# Patient Record
Sex: Male | Born: 1950
Health system: Southern US, Community
[De-identification: ages and names within clinical notes are randomized; demographics above are authoritative.]

## PROBLEM LIST (undated history)

## (undated) DIAGNOSIS — T7840XA Allergy, unspecified, initial encounter: Secondary | ICD-10-CM

## (undated) DIAGNOSIS — R001 Bradycardia, unspecified: Secondary | ICD-10-CM

## (undated) DIAGNOSIS — I452 Bifascicular block: Secondary | ICD-10-CM

## (undated) DIAGNOSIS — N529 Male erectile dysfunction, unspecified: Secondary | ICD-10-CM

## (undated) DIAGNOSIS — E079 Disorder of thyroid, unspecified: Secondary | ICD-10-CM

## (undated) DIAGNOSIS — E119 Type 2 diabetes mellitus without complications: Secondary | ICD-10-CM

## (undated) DIAGNOSIS — I5022 Chronic systolic (congestive) heart failure: Secondary | ICD-10-CM

## (undated) DIAGNOSIS — Z95810 Presence of automatic (implantable) cardiac defibrillator: Secondary | ICD-10-CM

## (undated) DIAGNOSIS — C801 Malignant (primary) neoplasm, unspecified: Secondary | ICD-10-CM

## (undated) DIAGNOSIS — M1990 Unspecified osteoarthritis, unspecified site: Secondary | ICD-10-CM

## (undated) DIAGNOSIS — E785 Hyperlipidemia, unspecified: Secondary | ICD-10-CM

## (undated) DIAGNOSIS — I255 Ischemic cardiomyopathy: Secondary | ICD-10-CM

## (undated) DIAGNOSIS — I2510 Atherosclerotic heart disease of native coronary artery without angina pectoris: Secondary | ICD-10-CM

## (undated) DIAGNOSIS — D689 Coagulation defect, unspecified: Secondary | ICD-10-CM

## (undated) HISTORY — DX: Type 2 diabetes mellitus without complications: E11.9

## (undated) HISTORY — DX: Atherosclerotic heart disease of native coronary artery without angina pectoris: I25.10

## (undated) HISTORY — PX: TONSILLECTOMY: SUR1361

## (undated) HISTORY — DX: Ischemic cardiomyopathy: I25.5

## (undated) HISTORY — DX: Bifascicular block: I45.2

## (undated) HISTORY — DX: Malignant (primary) neoplasm, unspecified: C80.1

## (undated) HISTORY — DX: Allergy, unspecified, initial encounter: T78.40XA

## (undated) HISTORY — DX: Hyperlipidemia, unspecified: E78.5

## (undated) HISTORY — DX: Bradycardia, unspecified: R00.1

## (undated) HISTORY — DX: Chronic systolic (congestive) heart failure: I50.22

## (undated) HISTORY — PX: COLONOSCOPY: SHX174

## (undated) HISTORY — DX: Coagulation defect, unspecified: D68.9

## (undated) HISTORY — DX: Presence of automatic (implantable) cardiac defibrillator: Z95.810

## (undated) HISTORY — DX: Unspecified osteoarthritis, unspecified site: M19.90

## (undated) HISTORY — DX: Male erectile dysfunction, unspecified: N52.9

---

## 1985-12-15 HISTORY — PX: ORIF ANKLE FRACTURE: SUR919

## 2004-12-02 ENCOUNTER — Ambulatory Visit: Payer: Self-pay | Admitting: Internal Medicine

## 2004-12-10 ENCOUNTER — Ambulatory Visit: Payer: Self-pay | Admitting: Internal Medicine

## 2004-12-23 ENCOUNTER — Ambulatory Visit: Payer: Self-pay | Admitting: Internal Medicine

## 2005-01-03 ENCOUNTER — Ambulatory Visit: Payer: Self-pay | Admitting: Internal Medicine

## 2005-01-27 ENCOUNTER — Ambulatory Visit: Payer: Self-pay | Admitting: Internal Medicine

## 2005-01-28 ENCOUNTER — Encounter: Admission: RE | Admit: 2005-01-28 | Discharge: 2005-01-28 | Payer: Self-pay | Admitting: Internal Medicine

## 2005-02-03 ENCOUNTER — Ambulatory Visit: Payer: Self-pay | Admitting: Internal Medicine

## 2005-02-10 ENCOUNTER — Ambulatory Visit: Payer: Self-pay | Admitting: *Deleted

## 2005-02-10 ENCOUNTER — Inpatient Hospital Stay (HOSPITAL_COMMUNITY): Admission: EM | Admit: 2005-02-10 | Discharge: 2005-02-16 | Payer: Self-pay | Admitting: Emergency Medicine

## 2005-02-10 ENCOUNTER — Ambulatory Visit: Payer: Self-pay

## 2005-02-12 DIAGNOSIS — Z951 Presence of aortocoronary bypass graft: Secondary | ICD-10-CM | POA: Insufficient documentation

## 2005-02-12 HISTORY — PX: CORONARY ARTERY BYPASS GRAFT: SHX141

## 2005-02-26 ENCOUNTER — Ambulatory Visit: Payer: Self-pay | Admitting: *Deleted

## 2005-03-10 ENCOUNTER — Encounter (HOSPITAL_COMMUNITY): Admission: RE | Admit: 2005-03-10 | Discharge: 2005-06-08 | Payer: Self-pay | Admitting: *Deleted

## 2005-03-17 ENCOUNTER — Ambulatory Visit: Payer: Self-pay | Admitting: Internal Medicine

## 2005-03-31 ENCOUNTER — Ambulatory Visit: Payer: Self-pay | Admitting: *Deleted

## 2005-04-07 ENCOUNTER — Ambulatory Visit: Payer: Self-pay | Admitting: *Deleted

## 2005-05-07 ENCOUNTER — Ambulatory Visit: Payer: Self-pay | Admitting: *Deleted

## 2005-05-16 ENCOUNTER — Ambulatory Visit: Payer: Self-pay | Admitting: *Deleted

## 2005-05-16 ENCOUNTER — Ambulatory Visit: Payer: Self-pay

## 2005-06-09 ENCOUNTER — Encounter (HOSPITAL_COMMUNITY): Admission: RE | Admit: 2005-06-09 | Discharge: 2005-09-07 | Payer: Self-pay | Admitting: *Deleted

## 2005-06-18 ENCOUNTER — Ambulatory Visit: Payer: Self-pay | Admitting: Cardiology

## 2005-06-18 ENCOUNTER — Ambulatory Visit: Payer: Self-pay | Admitting: *Deleted

## 2005-09-22 ENCOUNTER — Ambulatory Visit: Payer: Self-pay | Admitting: Internal Medicine

## 2005-09-29 ENCOUNTER — Ambulatory Visit: Payer: Self-pay | Admitting: Internal Medicine

## 2005-10-03 ENCOUNTER — Ambulatory Visit: Payer: Self-pay | Admitting: Internal Medicine

## 2005-10-17 ENCOUNTER — Ambulatory Visit: Payer: Self-pay

## 2005-12-10 ENCOUNTER — Ambulatory Visit: Payer: Self-pay | Admitting: Internal Medicine

## 2005-12-25 ENCOUNTER — Ambulatory Visit: Payer: Self-pay | Admitting: Internal Medicine

## 2006-04-06 ENCOUNTER — Ambulatory Visit: Payer: Self-pay | Admitting: Internal Medicine

## 2006-04-16 ENCOUNTER — Ambulatory Visit (HOSPITAL_COMMUNITY): Admission: RE | Admit: 2006-04-16 | Discharge: 2006-04-16 | Payer: Self-pay | Admitting: Internal Medicine

## 2006-04-27 ENCOUNTER — Ambulatory Visit: Payer: Self-pay | Admitting: Internal Medicine

## 2006-04-28 ENCOUNTER — Ambulatory Visit: Payer: Self-pay

## 2006-06-08 ENCOUNTER — Ambulatory Visit: Payer: Self-pay | Admitting: Internal Medicine

## 2006-11-11 ENCOUNTER — Ambulatory Visit: Payer: Self-pay | Admitting: Internal Medicine

## 2006-12-09 ENCOUNTER — Ambulatory Visit: Payer: Self-pay | Admitting: Internal Medicine

## 2006-12-15 DIAGNOSIS — Z8639 Personal history of other endocrine, nutritional and metabolic disease: Secondary | ICD-10-CM

## 2006-12-15 DIAGNOSIS — Z862 Personal history of diseases of the blood and blood-forming organs and certain disorders involving the immune mechanism: Secondary | ICD-10-CM | POA: Insufficient documentation

## 2006-12-16 ENCOUNTER — Ambulatory Visit: Payer: Self-pay | Admitting: Internal Medicine

## 2006-12-16 LAB — CONVERTED CEMR LAB
AST: 17 units/L (ref 0–37)
Albumin: 3.1 g/dL — ABNORMAL LOW (ref 3.5–5.2)
BUN: 19 mg/dL (ref 6–23)
Cholesterol: 117 mg/dL (ref 0–200)
GFR calc non Af Amer: 93 mL/min
Glomerular Filtration Rate, Af Am: 112 mL/min/{1.73_m2}
HCT: 40.6 % (ref 39.0–52.0)
HDL: 41 mg/dL (ref >39.0)
Hemoglobin: 13.3 g/dL (ref 13.0–17.0)
PSA: 0.55 ng/mL (ref 0.10–4.00)
RBC: 4.53 M/uL (ref 4.22–5.81)
RDW: 11.3 % — ABNORMAL LOW (ref 11.5–14.6)
Sodium: 141 meq/L (ref 135–145)
Total Protein: 6.4 g/dL (ref 6.0–8.3)
VLDL: 9 mg/dL (ref 0–40)
WBC: 10 10*3/uL (ref 4.5–10.5)

## 2006-12-28 ENCOUNTER — Ambulatory Visit: Payer: Self-pay | Admitting: Internal Medicine

## 2006-12-31 ENCOUNTER — Ambulatory Visit: Payer: Self-pay | Admitting: Internal Medicine

## 2007-02-06 DIAGNOSIS — I4510 Unspecified right bundle-branch block: Secondary | ICD-10-CM | POA: Insufficient documentation

## 2007-02-06 DIAGNOSIS — M109 Gout, unspecified: Secondary | ICD-10-CM | POA: Insufficient documentation

## 2007-02-06 DIAGNOSIS — Z87898 Personal history of other specified conditions: Secondary | ICD-10-CM | POA: Insufficient documentation

## 2007-02-06 DIAGNOSIS — E785 Hyperlipidemia, unspecified: Secondary | ICD-10-CM | POA: Insufficient documentation

## 2007-02-06 DIAGNOSIS — I509 Heart failure, unspecified: Secondary | ICD-10-CM | POA: Insufficient documentation

## 2007-06-14 ENCOUNTER — Ambulatory Visit: Payer: Self-pay | Admitting: Internal Medicine

## 2007-06-16 LAB — CONVERTED CEMR LAB
ALT: 17 units/L (ref 0–53)
AST: 19 units/L (ref 0–37)

## 2007-08-19 ENCOUNTER — Ambulatory Visit: Payer: Self-pay | Admitting: Internal Medicine

## 2007-11-18 ENCOUNTER — Ambulatory Visit: Payer: Self-pay | Admitting: Internal Medicine

## 2007-12-16 LAB — HM COLONOSCOPY: HM Colonoscopy: NORMAL

## 2007-12-17 ENCOUNTER — Ambulatory Visit: Payer: Self-pay | Admitting: Internal Medicine

## 2007-12-17 DIAGNOSIS — F528 Other sexual dysfunction not due to a substance or known physiological condition: Secondary | ICD-10-CM | POA: Insufficient documentation

## 2007-12-17 LAB — CONVERTED CEMR LAB
Bilirubin Urine: NEGATIVE
pH: 5

## 2007-12-20 LAB — CONVERTED CEMR LAB
BUN: 11 mg/dL (ref 6–23)
Basophils Absolute: 0 10*3/uL (ref 0.0–0.1)
Basophils Relative: 0.3 % (ref 0.0–1.0)
Bilirubin, Direct: 0.1 mg/dL (ref 0.0–0.3)
Chloride: 105 meq/L (ref 96–112)
Creatinine, Ser: 0.8 mg/dL (ref 0.4–1.5)
Eosinophils Absolute: 0.3 10*3/uL (ref 0.0–0.6)
Hgb A1c MFr Bld: 5.9 % (ref 4.6–6.0)
Neutro Abs: 5.8 10*3/uL (ref 1.4–7.7)
Neutrophils Relative %: 56.1 % (ref 43.0–77.0)
PSA: 0.51 ng/mL (ref 0.10–4.00)
Platelets: 241 10*3/uL (ref 150–400)
Sodium: 141 meq/L (ref 135–145)
Total Bilirubin: 1.5 mg/dL — ABNORMAL HIGH (ref 0.3–1.2)
WBC: 10.4 10*3/uL (ref 4.5–10.5)

## 2007-12-27 ENCOUNTER — Telehealth (INDEPENDENT_AMBULATORY_CARE_PROVIDER_SITE_OTHER): Payer: Self-pay | Admitting: *Deleted

## 2008-01-07 ENCOUNTER — Ambulatory Visit: Payer: Self-pay | Admitting: Internal Medicine

## 2008-01-21 ENCOUNTER — Encounter: Payer: Self-pay | Admitting: Internal Medicine

## 2008-01-21 ENCOUNTER — Ambulatory Visit: Payer: Self-pay | Admitting: Internal Medicine

## 2008-02-21 ENCOUNTER — Ambulatory Visit: Payer: Self-pay | Admitting: Internal Medicine

## 2008-02-21 ENCOUNTER — Encounter: Payer: Self-pay | Admitting: Internal Medicine

## 2008-02-21 LAB — CONVERTED CEMR LAB
Albumin: 3.6 g/dL (ref 3.5–5.2)
Bilirubin, Direct: 0.3 mg/dL (ref 0.0–0.3)
CO2: 32 meq/L (ref 19–32)
CRP, High Sensitivity: <1 — ABNORMAL LOW (ref 0.00–5.00)
Cholesterol: 136 mg/dL (ref 0–200)
GFR calc non Af Amer: 106 mL/min
HDL: 45.9 mg/dL (ref >39.0)
Total Bilirubin: 1.9 mg/dL — ABNORMAL HIGH (ref 0.3–1.2)
Total CHOL/HDL Ratio: 3
Total Protein: 6.3 g/dL (ref 6.0–8.3)
Triglycerides: 84 mg/dL (ref 0–149)
VLDL: 17 mg/dL (ref 0–40)

## 2008-02-29 ENCOUNTER — Encounter: Payer: Self-pay | Admitting: Internal Medicine

## 2008-02-29 ENCOUNTER — Ambulatory Visit: Payer: Self-pay

## 2008-04-12 ENCOUNTER — Ambulatory Visit: Payer: Self-pay | Admitting: Internal Medicine

## 2008-05-15 DIAGNOSIS — Z95810 Presence of automatic (implantable) cardiac defibrillator: Secondary | ICD-10-CM

## 2008-05-15 HISTORY — DX: Presence of automatic (implantable) cardiac defibrillator: Z95.810

## 2008-05-26 ENCOUNTER — Ambulatory Visit: Payer: Self-pay | Admitting: Internal Medicine

## 2008-05-26 LAB — CONVERTED CEMR LAB
Calcium: 9.4 mg/dL (ref 8.4–10.5)
Chloride: 102 meq/L (ref 96–112)
Creatinine, Ser: 0.8 mg/dL (ref 0.4–1.5)
Eosinophils Absolute: 0.4 10*3/uL (ref 0.0–0.7)
INR: 1 (ref 0.8–1.0)
Monocytes Absolute: 0.7 10*3/uL (ref 0.1–1.0)
Monocytes Relative: 11.5 % (ref 3.0–12.0)
Neutro Abs: 3.4 10*3/uL (ref 1.4–7.7)
Potassium: 4.4 meq/L (ref 3.5–5.1)
RBC: 4.88 M/uL (ref 4.22–5.81)
RDW: 11.9 % (ref 11.5–14.6)
WBC: 6.4 10*3/uL (ref 4.5–10.5)

## 2008-06-01 ENCOUNTER — Ambulatory Visit (HOSPITAL_COMMUNITY): Admission: RE | Admit: 2008-06-01 | Discharge: 2008-06-02 | Payer: Self-pay | Admitting: Internal Medicine

## 2008-06-01 ENCOUNTER — Ambulatory Visit: Payer: Self-pay | Admitting: Internal Medicine

## 2008-06-01 DIAGNOSIS — Z95810 Presence of automatic (implantable) cardiac defibrillator: Secondary | ICD-10-CM | POA: Insufficient documentation

## 2008-06-01 HISTORY — PX: CARDIAC DEFIBRILLATOR PLACEMENT: SHX171

## 2008-06-14 ENCOUNTER — Ambulatory Visit: Payer: Self-pay | Admitting: Internal Medicine

## 2008-06-14 DIAGNOSIS — I2510 Atherosclerotic heart disease of native coronary artery without angina pectoris: Secondary | ICD-10-CM | POA: Insufficient documentation

## 2008-06-19 ENCOUNTER — Ambulatory Visit: Payer: Self-pay

## 2008-06-19 ENCOUNTER — Telehealth (INDEPENDENT_AMBULATORY_CARE_PROVIDER_SITE_OTHER): Payer: Self-pay | Admitting: *Deleted

## 2008-06-19 DIAGNOSIS — R739 Hyperglycemia, unspecified: Secondary | ICD-10-CM | POA: Insufficient documentation

## 2008-06-19 LAB — CONVERTED CEMR LAB: Hgb A1c MFr Bld: 6.2 % — ABNORMAL HIGH (ref 4.6–6.0)

## 2008-06-21 ENCOUNTER — Encounter: Admission: RE | Admit: 2008-06-21 | Discharge: 2008-06-21 | Payer: Self-pay | Admitting: Internal Medicine

## 2008-06-21 ENCOUNTER — Encounter: Payer: Self-pay | Admitting: Internal Medicine

## 2008-09-26 ENCOUNTER — Ambulatory Visit: Payer: Self-pay | Admitting: Internal Medicine

## 2008-09-27 ENCOUNTER — Ambulatory Visit: Payer: Self-pay | Admitting: Internal Medicine

## 2008-12-11 ENCOUNTER — Encounter (INDEPENDENT_AMBULATORY_CARE_PROVIDER_SITE_OTHER): Payer: Self-pay | Admitting: *Deleted

## 2008-12-25 ENCOUNTER — Ambulatory Visit: Payer: Self-pay | Admitting: Internal Medicine

## 2009-03-02 ENCOUNTER — Ambulatory Visit: Payer: Self-pay | Admitting: Internal Medicine

## 2009-03-07 ENCOUNTER — Ambulatory Visit: Payer: Self-pay | Admitting: Internal Medicine

## 2009-03-07 LAB — CONVERTED CEMR LAB
AST: 17 units/L (ref 0–37)
Basophils Relative: 1.4 % (ref 0.0–3.0)
CO2: 31 meq/L (ref 19–32)
Chloride: 104 meq/L (ref 96–112)
Creatinine, Ser: 0.7 mg/dL (ref 0.4–1.5)
Creatinine,U: 43.5 mg/dL
GFR calc non Af Amer: 122.84 mL/min (ref >60)
Glucose, Bld: 133 mg/dL — ABNORMAL HIGH (ref 70–99)
HDL: 51 mg/dL (ref >39.00)
Hemoglobin: 14.6 g/dL (ref 13.0–17.0)
Hgb A1c MFr Bld: 6.1 % (ref 4.6–6.5)
LDL Cholesterol: 72 mg/dL (ref 0–99)
MCHC: 33.4 g/dL (ref 30.0–36.0)
Microalb Creat Ratio: 4.6 mg/g (ref 0.0–30.0)
Microalb, Ur: 0.2 mg/dL (ref 0.0–1.9)
Monocytes Absolute: 1.1 10*3/uL — ABNORMAL HIGH (ref 0.1–1.0)
Monocytes Relative: 13.4 % — ABNORMAL HIGH (ref 3.0–12.0)
Neutro Abs: 4.1 10*3/uL (ref 1.4–7.7)
PSA: 0.67 ng/mL (ref 0.10–4.00)
Platelets: 237 10*3/uL (ref 150.0–400.0)
RBC: 4.73 M/uL (ref 4.22–5.81)
Triglycerides: 71 mg/dL (ref 0.0–149.0)
WBC: 8.3 10*3/uL (ref 4.5–10.5)

## 2009-03-13 ENCOUNTER — Encounter: Payer: Self-pay | Admitting: Internal Medicine

## 2009-03-26 ENCOUNTER — Ambulatory Visit: Payer: Self-pay | Admitting: Internal Medicine

## 2009-04-07 DIAGNOSIS — K219 Gastro-esophageal reflux disease without esophagitis: Secondary | ICD-10-CM | POA: Insufficient documentation

## 2009-04-11 ENCOUNTER — Ambulatory Visit: Payer: Self-pay | Admitting: Internal Medicine

## 2009-04-17 ENCOUNTER — Telehealth: Payer: Self-pay | Admitting: Internal Medicine

## 2009-04-18 ENCOUNTER — Ambulatory Visit: Payer: Self-pay | Admitting: Internal Medicine

## 2009-04-19 ENCOUNTER — Encounter: Payer: Self-pay | Admitting: Internal Medicine

## 2009-07-17 ENCOUNTER — Encounter: Payer: Self-pay | Admitting: Internal Medicine

## 2009-08-17 ENCOUNTER — Ambulatory Visit: Payer: Self-pay | Admitting: Internal Medicine

## 2009-08-30 ENCOUNTER — Ambulatory Visit: Payer: Self-pay | Admitting: Internal Medicine

## 2009-08-31 ENCOUNTER — Telehealth (INDEPENDENT_AMBULATORY_CARE_PROVIDER_SITE_OTHER): Payer: Self-pay | Admitting: *Deleted

## 2009-08-31 LAB — CONVERTED CEMR LAB: Hgb A1c MFr Bld: 6 % (ref 4.6–6.5)

## 2009-09-11 ENCOUNTER — Telehealth (INDEPENDENT_AMBULATORY_CARE_PROVIDER_SITE_OTHER): Payer: Self-pay | Admitting: *Deleted

## 2009-09-27 ENCOUNTER — Ambulatory Visit: Payer: Self-pay | Admitting: Internal Medicine

## 2009-10-03 ENCOUNTER — Ambulatory Visit: Payer: Self-pay | Admitting: Internal Medicine

## 2009-10-03 DIAGNOSIS — I5022 Chronic systolic (congestive) heart failure: Secondary | ICD-10-CM | POA: Insufficient documentation

## 2009-10-11 ENCOUNTER — Telehealth: Payer: Self-pay | Admitting: Internal Medicine

## 2009-10-16 ENCOUNTER — Ambulatory Visit: Payer: Self-pay | Admitting: Internal Medicine

## 2009-10-16 ENCOUNTER — Ambulatory Visit: Payer: Self-pay

## 2009-10-16 ENCOUNTER — Ambulatory Visit: Payer: Self-pay | Admitting: Cardiology

## 2009-10-16 ENCOUNTER — Ambulatory Visit (HOSPITAL_COMMUNITY): Admission: RE | Admit: 2009-10-16 | Discharge: 2009-10-16 | Payer: Self-pay | Admitting: Internal Medicine

## 2009-10-16 ENCOUNTER — Encounter: Payer: Self-pay | Admitting: Internal Medicine

## 2009-10-30 ENCOUNTER — Telehealth: Payer: Self-pay | Admitting: Internal Medicine

## 2009-11-18 ENCOUNTER — Encounter: Payer: Self-pay | Admitting: Internal Medicine

## 2009-12-10 ENCOUNTER — Encounter: Payer: Self-pay | Admitting: Internal Medicine

## 2009-12-17 ENCOUNTER — Telehealth: Payer: Self-pay | Admitting: Internal Medicine

## 2010-02-12 HISTORY — PX: SHOULDER ARTHROSCOPY: SHX128

## 2010-02-25 ENCOUNTER — Encounter: Payer: Self-pay | Admitting: Internal Medicine

## 2010-02-27 ENCOUNTER — Ambulatory Visit: Payer: Self-pay | Admitting: Internal Medicine

## 2010-03-12 ENCOUNTER — Encounter: Payer: Self-pay | Admitting: Internal Medicine

## 2010-03-13 ENCOUNTER — Ambulatory Visit: Payer: Self-pay | Admitting: Internal Medicine

## 2010-03-14 ENCOUNTER — Telehealth: Payer: Self-pay | Admitting: Internal Medicine

## 2010-03-15 ENCOUNTER — Telehealth (INDEPENDENT_AMBULATORY_CARE_PROVIDER_SITE_OTHER): Payer: Self-pay | Admitting: *Deleted

## 2010-03-15 DIAGNOSIS — D72829 Elevated white blood cell count, unspecified: Secondary | ICD-10-CM | POA: Insufficient documentation

## 2010-03-15 DIAGNOSIS — R972 Elevated prostate specific antigen [PSA]: Secondary | ICD-10-CM | POA: Insufficient documentation

## 2010-03-15 LAB — CONVERTED CEMR LAB
ALT: 17 units/L (ref 0–53)
BUN: 7 mg/dL (ref 6–23)
Basophils Relative: 0.1 % (ref 0.0–3.0)
Chloride: 98 meq/L (ref 96–112)
Cholesterol: 136 mg/dL (ref 0–200)
Eosinophils Absolute: 0.1 10*3/uL (ref 0.0–0.7)
Eosinophils Relative: 0.7 % (ref 0.0–5.0)
HCT: 41.8 % (ref 39.0–52.0)
HDL: 58.3 mg/dL (ref >39.00)
Hemoglobin: 13.6 g/dL (ref 13.0–17.0)
LDL Cholesterol: 67 mg/dL (ref 0–99)
Lymphocytes Relative: 7.9 % — ABNORMAL LOW (ref 12.0–46.0)
MCHC: 32.6 g/dL (ref 30.0–36.0)
MCV: 93.9 fL (ref 78.0–100.0)
Monocytes Relative: 7.4 % (ref 3.0–12.0)
Neutrophils Relative %: 83.9 % — ABNORMAL HIGH (ref 43.0–77.0)
Potassium: 3.7 meq/L (ref 3.5–5.1)
RDW: 11.8 % (ref 11.5–14.6)
TSH: 0.68 microintl units/mL (ref 0.35–5.50)
Total CHOL/HDL Ratio: 2
Total Protein: 7.2 g/dL (ref 6.0–8.3)
Triglycerides: 54 mg/dL (ref 0.0–149.0)

## 2010-04-10 ENCOUNTER — Ambulatory Visit: Payer: Self-pay | Admitting: Internal Medicine

## 2010-04-10 DIAGNOSIS — I080 Rheumatic disorders of both mitral and aortic valves: Secondary | ICD-10-CM | POA: Insufficient documentation

## 2010-04-18 ENCOUNTER — Ambulatory Visit: Payer: Self-pay | Admitting: Internal Medicine

## 2010-04-18 LAB — CONVERTED CEMR LAB
Basophils Absolute: 0.1 10*3/uL (ref 0.0–0.1)
Basophils Relative: 0.9 % (ref 0.0–3.0)
Eosinophils Absolute: 0.4 10*3/uL (ref 0.0–0.7)
HCT: 42.4 % (ref 39.0–52.0)
Lymphs Abs: 2.1 10*3/uL (ref 0.7–4.0)
MCHC: 33.5 g/dL (ref 30.0–36.0)
Monocytes Absolute: 1 10*3/uL (ref 0.1–1.0)
Neutro Abs: 4.1 10*3/uL (ref 1.4–7.7)
PSA: 0.67 ng/mL (ref 0.10–4.00)
RBC: 4.58 M/uL (ref 4.22–5.81)

## 2010-05-23 ENCOUNTER — Telehealth: Payer: Self-pay | Admitting: Internal Medicine

## 2010-05-28 ENCOUNTER — Encounter: Payer: Self-pay | Admitting: Internal Medicine

## 2010-05-29 ENCOUNTER — Ambulatory Visit: Payer: Self-pay | Admitting: Internal Medicine

## 2010-06-13 ENCOUNTER — Encounter: Payer: Self-pay | Admitting: Internal Medicine

## 2010-08-20 ENCOUNTER — Ambulatory Visit: Payer: Self-pay | Admitting: Internal Medicine

## 2010-08-20 DIAGNOSIS — M79609 Pain in unspecified limb: Secondary | ICD-10-CM | POA: Insufficient documentation

## 2010-08-22 LAB — CONVERTED CEMR LAB
ALT: 16 units/L (ref 0–53)
AST: 21 units/L (ref 0–37)
CO2: 31 meq/L (ref 19–32)
Chloride: 105 meq/L (ref 96–112)
Glucose, Bld: 112 mg/dL — ABNORMAL HIGH (ref 70–99)
Microalb Creat Ratio: 0.2 mg/g (ref 0.0–30.0)
Potassium: 4.7 meq/L (ref 3.5–5.1)

## 2010-09-17 ENCOUNTER — Ambulatory Visit: Payer: Self-pay | Admitting: Internal Medicine

## 2010-12-19 ENCOUNTER — Ambulatory Visit
Admission: RE | Admit: 2010-12-19 | Discharge: 2010-12-19 | Payer: Self-pay | Source: Home / Self Care | Attending: Internal Medicine | Admitting: Internal Medicine

## 2010-12-25 ENCOUNTER — Encounter (INDEPENDENT_AMBULATORY_CARE_PROVIDER_SITE_OTHER): Payer: Self-pay | Admitting: *Deleted

## 2011-01-05 ENCOUNTER — Encounter: Payer: Self-pay | Admitting: Internal Medicine

## 2011-01-14 NOTE — Progress Notes (Signed)
Summary: questions regarding letter from ins co ([employer])   Phone Note Call from Patient Call back at Work Phone [phone] Call back at [phone]   Caller: Patient Reason for Call: Talk to Nurse Summary of Call: Patient would like to speak to RN concerning a notice he received from [facility] [employer] ([employer]).  Letter states need to change medication to a generic form. Initial call taken by: Burnard Leigh,  December 17, 2009 8:27 AM  Follow-up for Phone Call        wants to change to losartan due to cost, he is currently taking atacand 4mg  daily, will discuss w/Dr Estephan Gallardo and call pt back tom Meredith Staggers, RN  December 17, 2009 2:12 PM   Additional Follow-up for Phone Call Additional follow up Details #1::        i prefer atacand 4 qd or diovan 40 two times a day but if has to use losratan due to insurance would go with 25 once daily Dolores Patty, MD, [facility]  December 17, 2009 10:39 PM  pt aware, just received shipment of atacand will cont. for now, will discuss changing at next visit Meredith Staggers, RN  December 18, 2009 3:39 PM

## 2011-01-14 NOTE — Cardiovascular Report (Signed)
Summary: Office Visit Remote   Office Visit Remote   Imported By: Roderic Ovens 06/14/2010 16:38:26  _____________________________________________________________________  External Attachment:    Type:   Image     Comment:   External Document

## 2011-01-14 NOTE — Letter (Signed)
Summary: Remote Device Check  [facility], Main Office  [address]   [place], [place] [address]   Phone: [phone]  Fax: [phone]     June 13, 2010 MRN: [id number]   Matthew Joyce [address] [place], [place]  [address]   Dear [name],   Your remote transmission was recieved and reviewed by your physician.  All diagnostics were within normal limits for you.   __X____Your next office visit is scheduled for:   September 2011 with Dr Ladona Ridgel.                            Please call our office to schedule an appointment.    Sincerely,  Vella Kohler

## 2011-01-14 NOTE — Assessment & Plan Note (Signed)
Summary: df2    Visit Type:  Follow-up Primary Provider:  Nolon Rod. Paz MD   History of Present Illness: Mr. Mcfarlane  is a delightful 60 year old male with a history of a coronary artery disease status post bypass surgery in 2006.  He also had mod LV dysfunction. EF 30-35% in March 2009.There is also history of ischemic mitral regurgitation.  Remainder of medical history is for diabetes, hyperlipidemia and bradycardia. Ace-I previously stopped due to hypotension. B-blocker dosing limited by bradycardia.  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  He denies any intercurrent ICD therapies.  He is exercising 50 minutes a day on an exercise bike.       Current Medications (verified): 1)  Niaspan 1000 Mg Tbcr (Niacin (Antihyperlipidemic)) .... Take 1 Tablet By Mouth Once A Day 2)  Simvastatin 40 Mg Tabs (Simvastatin) .... Take 1 Tablet By Mouth At Bedtime 3)  Fish Oil   Oil (Fish Oil) .... 1000mg  3 Tabs Daily 4)  Aspirin 81 Mg Tbec (Aspirin) .... Take One Tablet By Mouth Daily 5)  Toprol Xl 25 Mg Xr24h-Tab (Metoprolol Succinate) .... 1/2 Tab Two Times A Day 6)  Atacand 4 Mg Tabs (Candesartan Cilexetil) .... Take 1 Tablet By Mouth Once A Day 7)  Zyrtec Allergy 10 Mg Tabs (Cetirizine Hcl) .... Once Daily As Needed  Allergies: 1)  ! Sulfa 2)  Lipitor (Atorvastatin Calcium)  Past History:  Past Medical History: Last updated: 08/20/2010 1. Coronary artery disease.     a.     Status post coronary artery bypass grafting in March 2006 by   Dr. Cornelius Moras. 2. Ischemic cardiomyopathy.     a.     Echocardiogram, November 2006, showed ejection fraction of  30-40% with mild to moderate mitral regurgitation and mild aortic  regurgitation.     b.     Cardiac MRI, May of 2007, EF of 44% with 50% scar involving  the inferolateral walls.  No comment on mitral regurgitation.     c.  Last echo 3/11   EF 30-35% mod MR     d.  Negative T-wave alternans testing in May of 2007.     e. Altace  stopped due to symptomatic hypotension. B-block limited due to bradycardia     f. s/p St. jude single chamber ICD     g. CPX 3/11 pVO2 32.5 slope 29.4 RER 1.18 (normal) 3. Chronic right bundle branch block.   Gout Hyperlipidemia AODM ED  Past Surgical History: Last updated: 08/20/2010 tonsilectomy as a child  Coronary artery bypass graft (02-2005) ICD 06/01/2008 ORIF    L ankle  (Fx-1987) shoulder scope (rt shoulder) 02/2010  Review of Systems  The patient denies chest pain, syncope, dyspnea on exertion, and peripheral edema.    Vital Signs:  Patient profile:   60 year old male Height:      68 inches Weight:      195 pounds BMI:     29.76 Pulse rate:   56 / minute BP sitting:   110 / 74  (left arm)  Vitals Entered By: Laurance Flatten CMA (September 17, 2010 9:21 AM)  Physical Exam  General:  Well developed, well nourished, in no acute distress.  HEENT: normal Neck: supple. No JVD. Carotids 2+ bilaterally no bruits Cor: RRR no rubs, gallops or murmur Lungs: CTA. Well healed ICD incision. Ab: soft, nontender. nondistended. No HSM. Good bowel sounds Ext: warm. no cyanosis, clubbing or edema Neuro: alert  and oriented. Grossly nonfocal. affect pleasant     ICD Specifications Following MD:  Lewayne Latendresse, MD     Referring MD:  BENSIMHON ICD Vendor:  [employer]     ICD Model Number:  [id number]     ICD Serial Number:  [id number] ICD DOI:  06/01/2008     ICD Implanting MD:  Lewayne Gouveia, MD  Lead 1:    Location: RV     DOI: 06/01/2008     Model #: 8119     Serial #: [id number]     Status: active  Indications::  ICM   ICD Follow Up Battery Voltage:  >3.20 V     Charge Time:  10.8 seconds     Battery Est. Longevity:  7.3 yrs Underlying rhythm:  SR ICD Dependent:  No       ICD Device Measurements Right Ventricle:  Amplitude: 10.2 mV, Impedance: 390 ohms, Threshold: 0.75 V at 0.5 msec Shock Impedance: 47 ohms   Episodes MS Episodes:  0     Coumadin:  No Shock:  0     ATP:  0      Nonsustained:  0     Atrial Therapies:  0 Ventricular Pacing:  <1%  Brady Parameters Mode VVI     Lower Rate Limit:  40      Tachy Zones VF:  240     VT:  214     VT1:  181     Next Remote Date:  12/19/2010     Next Cardiology Appt Due:  09/15/2011 Tech Comments:  NORMAL DEVICE FUNCTION.  NO EPISODES SINCE LAST CHECK.  NO CHANGES MADE. MERLIN TRANSMISSION 12-19-09. ROV IN 12 MTHS W/GT. Vella Kohler  September 17, 2010 9:29 AM MD Comments:  Agree with above.  Impression & Recommendations:  Problem # 1:  SYSTOLIC HEART FAILURE, CHRONIC (ICD-428.22) He remains class 1-2.  Continue meds as below and maintain a low sodium diet. His updated medication list for this problem includes:    Aspirin 81 Mg Tbec (Aspirin) .Marland Kitchen... Take one tablet by mouth daily    Toprol Xl 25 Mg Xr24h-tab (Metoprolol succinate) .Marland Kitchen... 1/2 tab two times a day    Atacand 4 Mg Tabs (Candesartan cilexetil) .Marland Kitchen... Take 1 tablet by mouth once a day  Problem # 2:  IMPLANTATION OF DEFIBRILLATOR, HX OF (ICD-V45.02) His device is working normally.  Will recheck in several months.  Problem # 3:  CORONARY ARTERY DISEASE (ICD-414.00) He denies anginal symptoms. Continue current meds. His updated medication list for this problem includes:    Aspirin 81 Mg Tbec (Aspirin) .Marland Kitchen... Take one tablet by mouth daily    Toprol Xl 25 Mg Xr24h-tab (Metoprolol succinate) .Marland Kitchen... 1/2 tab two times a day  Patient Instructions: 1)  Your physician wants you to follow-up in:  12 months with Dr Court Joy will receive a reminder letter in the mail two months in advance. If you don't receive a letter, please call our office to schedule the follow-up appointment. 2)  Merlin transmission on 12/19/2010 Prescriptions: ATACAND 4 MG TABS (CANDESARTAN CILEXETIL) Take 1 tablet by mouth once a day  #90 x 3   Entered by:   Laurance Flatten CMA   Authorized by:   Laren Boom, MD, [facility]   Signed by:   Laurance Flatten CMA on 09/17/2010   Method used:   Faxed to ...        [facility] Environmental education officer)       [address] [address]       [place], [place]  [address]       Ph: [phone]       Fax: [phone]   RxID:   [id number] TOPROL XL 25 MG XR24H-TAB (METOPROLOL SUCCINATE) 1/2 tab two times a day  #90 x 3   Entered by:   Laurance Flatten CMA   Authorized by:   Laren Boom, MD, [facility]   Signed by:   Laurance Flatten CMA on 09/17/2010   Method used:   Faxed to ...       [facility] Environmental education officer)       [address] [address]       [place], [place]  [address]       Ph: [phone]       Fax: [phone]   RxID:   [id number] SIMVASTATIN 40 MG TABS (SIMVASTATIN) Take 1 tablet by mouth at bedtime  #90 x 3   Entered by:   Laurance Flatten CMA   Authorized by:   Laren Boom, MD, [facility]   Signed by:   Laurance Flatten CMA on 09/17/2010   Method used:   Faxed to ...       [facility] Environmental education officer)       [address] [address]       [place], [place]  [address]       Ph: [phone]       Fax: [phone]   RxID:   [id number] NIASPAN 1000 MG TBCR (NIACIN (ANTIHYPERLIPIDEMIC)) Take 1 tablet by mouth once a day  #90 x 3   Entered by:   Laurance Flatten CMA   Authorized by:   Laren Boom, MD, [facility]   Signed by:   Laurance Flatten CMA on 09/17/2010   Method used:   Faxed to ...       [facility] Environmental education officer)       [address] [address]       [place], [place]  [address]       Ph: [phone]       Fax: [phone]   RxID:   [id number] ATACAND 4 MG TABS (CANDESARTAN CILEXETIL) Take 1 tablet by mouth once a day  #90 x 3   Entered by:   Laurance Flatten CMA   Authorized by:   Laren Boom, MD, [facility]   Signed by:   Laurance Flatten CMA on 09/17/2010   Method used:   Electronically to        [facility]* (mail-order)             , [place]         Ph: [phone]       Fax: [phone]   RxID:   [id number] TOPROL XL 25 MG XR24H-TAB (METOPROLOL SUCCINATE) 1/2 tab two times a day  #90 x 3   Entered by:   Laurance Flatten CMA   Authorized by:   Laren Boom, MD, [facility]   Signed by:    Laurance Flatten CMA on 09/17/2010   Method used:   Electronically to        [facility] (mail-order)             ,          Ph: [phone]       Fax: [phone]   RxID:   [id number] SIMVASTATIN 40 MG TABS (SIMVASTATIN) Take 1 tablet by mouth at bedtime  #90 x 3   Entered by:   Laurance Flatten CMA   Authorized by:   Laren Boom, MD,  [facility]   Signed by:   Laurance Flatten CMA on 09/17/2010   Method used:   Electronically to        [facility]* (mail-order)             , [place]         Ph: [phone]       Fax: [phone]   RxID:   [id number] NIASPAN 1000 MG TBCR (NIACIN (ANTIHYPERLIPIDEMIC)) Take 1 tablet by mouth once a day  #90 x 3   Entered by:   Laurance Flatten CMA   Authorized by:   Laren Boom, MD, [facility]   Signed by:   Laurance Flatten CMA on 09/17/2010   Method used:   Electronically to        [facility]* (mail-order)             , [place]         Ph: [phone]       Fax: [phone]   RxID:   [id number]

## 2011-01-14 NOTE — Progress Notes (Signed)
Summary: due labs in 6 weeks  Phone Note Outgoing Call Call back at Work Phone [phone]   Summary of Call: due labs in 6 weeks - CBC, PSA 288.60, 790.93 Shary Decamp  March 15, 2010 9:24 AM   New Problems: LEUKOCYTOSIS UNSPECIFIED (ICD-288.60) PSA, INCREASED (ICD-790.93)   Additional Follow-up for Phone Call Additional follow up Details #2::    patient has an appt on May 5,2011 Follow-up by: Barb Merino,  March 15, 2010 2:27 PM  New Problems: LEUKOCYTOSIS UNSPECIFIED (ICD-288.60) PSA, INCREASED (ICD-790.93)

## 2011-01-14 NOTE — Progress Notes (Signed)
Summary: r/ s pacer phone check    Phone Note Outgoing Call Call back at Home Phone [phone]   Caller: Patient Reason for Call: Talk to Nurse Summary of Call: pt wants to r.s his pacer phone check on monday. Initial call taken by: Lorne Skeens,  May 23, 2010 11:06 AM Summary of Call: pt rescheduled remote check for 05-29-10. Vella Kohler  May 24, 2010 3:29 PM

## 2011-01-14 NOTE — Cardiovascular Report (Signed)
Summary: Office Visit Remote   Office Visit Remote   Imported By: Roderic Ovens 03/14/2010 11:55:08  _____________________________________________________________________  External Attachment:    Type:   Image     Comment:   External Document

## 2011-01-14 NOTE — Progress Notes (Signed)
  Phone Note Outgoing Call   Summary of Call: labs reviewed, WBC and PSA elevated patient was seen yesterday, prostate exam  negative, GU ROS neg ?prostatitis, recent shoulder surgery, ? increased WBC related to that  [facility] for patient, asked him to call  tomorrow  Chelsey Kimberley E. Sharlynn Seckinger MD  March 14, 2010 1:02 PM   Follow-up for Phone Call        spoke with patient, at the time of the OV he had URI symptoms, that is resolved.   He had recent shoulder surgery---> no fever, redness, swelling in the area of the scope. He did have some dysuria without difficulty urinating or hematuria. ?  Prostatitis Plan:  Antibiotics--Levaquin 500 mg daily for 10 days recheck CBC PSA in 6 weeks,  dx prostatitis patient aware Catharina Pica E. Marque Rademaker MD  March 15, 2010 9:17 AM     New/Updated Medications: LEVAQUIN 500 MG TABS (LEVOFLOXACIN) 1 by mouth once daily x 10days Prescriptions: LEVAQUIN 500 MG TABS (LEVOFLOXACIN) 1 by mouth once daily x 10days  #10 x 0   Entered by:   Shary Decamp   Authorized by:   Nolon Rod. Salina Stanfield MD   Signed by:   Shary Decamp on 03/15/2010   Method used:   Electronically to        [facility]* (retail)       [address] [place], [place]  [address]       Ph: [phone]       Fax: [phone]   RxID:   [id number]

## 2011-01-14 NOTE — Assessment & Plan Note (Signed)
Summary: 6 month ov//ph   Vital Signs:  Patient profile:   60 year old male Weight:      191.38 pounds Pulse rate:   70 / minute Pulse rhythm:   regular BP sitting:   118 / 78  (left arm) Cuff size:   regular  Vitals Entered By: Army Fossa CMA (August 20, 2010 8:35 AM) CC: 6 month f/u- Fasting Comments pham- [facility] flu shot.   History of Present Illness: ROV several weeks history of left foot pain at the base of the second toe. Saw podiatrist, x-rays reportedly negative, he was prescribed metatarsal bars. Still hurting  Current Medications (verified): 1)  Niaspan 1000 Mg Tbcr (Niacin (Antihyperlipidemic)) .... Take 1 Tablet By Mouth Once A Day 2)  Simvastatin 40 Mg Tabs (Simvastatin) .... Take 1 Tablet By Mouth At Bedtime 3)  Fish Oil   Oil (Fish Oil) .... 1000mg  3 Tabs Daily 4)  Aspirin 81 Mg Tbec (Aspirin) .... Take One Tablet By Mouth Daily 5)  Toprol Xl 25 Mg Xr24h-Tab (Metoprolol Succinate) .... 1/2 Tab Two Times A Day 6)  Atacand 4 Mg Tabs (Candesartan Cilexetil) .... Take 1 Tablet By Mouth Once A Day 7)  Zyrtec Allergy 10 Mg Tabs (Cetirizine Hcl) .... Once Daily As Needed  Allergies: 1)  ! Sulfa 2)  Lipitor (Atorvastatin Calcium)  Past History:  Past Medical History: 1. Coronary artery disease.     a.     Status post coronary artery bypass grafting in March 2006 by   Dr. Cornelius Moras. 2. Ischemic cardiomyopathy.     a.     Echocardiogram, November 2006, showed ejection fraction of  30-40% with mild to moderate mitral regurgitation and mild aortic  regurgitation.     b.     Cardiac MRI, May of 2007, EF of 44% with 50% scar involving  the inferolateral walls.  No comment on mitral regurgitation.     c.  Last echo 3/11   EF 30-35% mod MR     d.  Negative T-wave alternans testing in May of 2007.     e. Altace stopped due to symptomatic hypotension. B-block limited due to bradycardia     f. s/p [employer] jude single chamber ICD     g. CPX 3/11 pVO2 32.5 slope  29.4 RER 1.18 (normal) 3. Chronic right bundle branch block.   Gout Hyperlipidemia AODM ED  Past Surgical History: tonsilectomy as a child  Coronary artery bypass graft (02-2005) ICD 06/01/2008 ORIF    L ankle  (Fx-1987) shoulder scope (rt shoulder) 02/2010 PSH reviewed for relevance  Social History: Reviewed history from 03/13/2010 and no changes required. 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 No smoking. ETOH-- socially 2 G kids  exercise-- walks routinely /day diet-- healthy mostly   Review of Systems       no amb.  CBGs Diet and exercise are very good, despite his foot pain he continued to exercise daily, doing bike instead of walking Ambulatory BPs usually less than 118/78   Physical Exam  General:  alert, well-developed, and well-nourished.   Lungs:  normal respiratory effort, no intercostal retractions, no accessory muscle use, and normal breath sounds.   Heart:  normal rate, regular rhythm, no murmur, and no gallop.   Msk:  tender without swelling at the base of the left second toe, plantar aspect Extremities:  no pretibial edema bilaterally   Diabetes Management Exam:    Foot Exam (  with socks and/or shoes not present):       Sensory-Pinprick/Light touch:          Left medial foot (L-4): normal          Left dorsal foot (L-5): normal          Left lateral foot (S-1): normal          Right medial foot (L-4): normal          Right dorsal foot (L-5): normal          Right lateral foot (S-1): normal       Sensory-Monofilament:          Left foot: normal          Right foot: normal       Inspection:          Left foot: normal          Right foot: normal       Nails:          Left foot: normal          Right foot: normal   Impression & Recommendations:  Problem # 1:  FOOT PAIN, LEFT (ICD-729.5)  few weeks history of left foot pain, x-rays reportedly negative Morton's neuroma? Stress  fracture question mark Plan: Refer to orthopedic surgery  Orders: Orthopedic Referral (Ortho)  Problem # 2:  DIABETES MELLITUS, BORDERLINE (ICD-790.29) one diet only, seems to be doing very well Orders: Venipuncture (84132) TLB-A1C / Hgb A1C (Glycohemoglobin) (83036-A1C) TLB-Microalbumin/Creat Ratio, Urine (82043-MALB) Specimen Handling (44010) Venipuncture ([address])  Labs Reviewed: Creat: 0.7 (03/13/2010)     Problem # 3:  CONGESTIVE HEART FAILURE (ICD-428.0) seems stable, labs His updated medication list for this problem includes:    Aspirin 81 Mg Tbec (Aspirin) .Marland Kitchen... Take one tablet by mouth daily    Toprol Xl 25 Mg Xr24h-tab (Metoprolol succinate) .Marland Kitchen... 1/2 tab two times a day    Atacand 4 Mg Tabs (Candesartan cilexetil) .Marland Kitchen... Take 1 tablet by mouth once a day     Problem # 4:  HYPERLIPIDEMIA (ICD-272.4) well controlled per last FLP, check liver enzymes His updated medication list for this problem includes:    Niaspan 1000 Mg Tbcr (Niacin (antihyperlipidemic)) .Marland Kitchen... Take 1 tablet by mouth once a day    Simvastatin 40 Mg Tabs (Simvastatin) .Marland Kitchen... Take 1 tablet by mouth at bedtime  Orders: TLB-ALT (SGPT) (84460-ALT) TLB-AST (SGOT) (84450-SGOT) Specimen Handling (66440) Venipuncture (34742)  Labs Reviewed: SGOT: 17 (03/13/2010)   SGPT: 17 (03/13/2010)   HDL:58.30 (03/13/2010), 51.00 (03/02/2009)  LDL:67 (03/13/2010), 72 (03/02/2009)  Chol:136 (03/13/2010), 137 (03/02/2009)  Trig:54.0 (03/13/2010), 71.0 (03/02/2009)  Complete Medication List: 1)  Niaspan 1000 Mg Tbcr (Niacin (antihyperlipidemic)) .... Take 1 tablet by mouth once a day 2)  Simvastatin 40 Mg Tabs (Simvastatin) .... Take 1 tablet by mouth at bedtime 3)  Fish Oil Oil (Fish oil) .... 1000mg  3 tabs daily 4)  Aspirin 81 Mg Tbec (Aspirin) .... Take one tablet by mouth daily 5)  Toprol Xl 25 Mg Xr24h-tab (Metoprolol succinate) .... 1/2 tab two times a day 6)  Atacand 4 Mg Tabs (Candesartan cilexetil) .... Take 1  tablet by mouth once a day 7)  Zyrtec Allergy 10 Mg Tabs (Cetirizine hcl) .... Once daily as needed  Other Orders: TLB-BMP (Basic Metabolic Panel-BMET) (80048-METABOL) Admin 1st Vaccine (59563) Flu Vaccine 3yrs + (87564)  Patient Instructions: 1)  Please schedule a follow-up appointment in 6 months, fasting, physical exam Flu Vaccine Consent Questions  Do you have a history of severe allergic reactions to this vaccine? no    Any prior history of allergic reactions to egg and/or gelatin? no    Do you have a sensitivity to the preservative Thimersol? no    Do you have a past history of Guillan-Barre Syndrome? no    Do you currently have an acute febrile illness? no    Have you ever had a severe reaction to latex? no    Vaccine information given and explained to patient? yes    Are you currently pregnant? no    Lot Number:[id number]   Exp Date:06/14/2011   Site Given  Left Deltoid IM   .lbflu

## 2011-01-14 NOTE — Assessment & Plan Note (Signed)
Summary: per check ou t/sf      Allergies Added:   Visit Type:  Follow-up Primary Provider:  Nolon Rod. Paz MD  CC:  no complaints.  History of Present Illness: Matthew Joyce is a delightful 60 year old male with a history of a coronary artery disease status post bypass surgery in 2006.  He also had mod LV dysfunction. EF 30-35% in March 2009.There is also history of ischemic mitral regurgitation.  Remainder of medical history is for diabetes, hyperlipidemia and bradycardia. Ace-I previously stopped due to hypotension. B-blocker dosing limited by bradycardia   At last visit felt ex tolerance was dropping.  Got echo 3/11 EF 30-35% mod MR (stable) CPX pVO2 32.5(!) slope 29.4 RER 1.18  Doing great. Walking every day. No CP, SOB. Denies palitations, orthopnea, PND or edema. Tolerating low-dose ARB and b-blocker despite fatigue. No ICD shocks. Recent ICD checked was good.  Recent lipids TC 136 TG 54 HDL 58 LDL 67  Current Medications (verified): 1)  Niaspan 1000 Mg Tbcr (Niacin (Antihyperlipidemic)) .... Take 1 Tablet By Mouth Once A Day 2)  Simvastatin 40 Mg Tabs (Simvastatin) .... Take 1 Tablet By Mouth At Bedtime 3)  Fish Oil   Oil (Fish Oil) .... 1000mg  3 Tabs Daily 4)  Aspirin 81 Mg Tbec (Aspirin) .... Take One Tablet By Mouth Daily 5)  Toprol Xl 25 Mg Xr24h-Tab (Metoprolol Succinate) .... 1/2 Tab Two Times A Day 6)  Atacand 4 Mg Tabs (Candesartan Cilexetil) .... Take 1 Tablet By Mouth Once A Day 7)  Zyrtec Allergy 10 Mg Tabs (Cetirizine Hcl) .... Once Daily As Needed  Allergies (verified): 1)  ! Sulfa 2)  Lipitor (Atorvastatin Calcium)  Past History:  Past Medical History: 1. Coronary artery disease.     a.     Status post coronary artery bypass grafting in March 2006 by      Dr. Cornelius Moras. 2. Ischemic cardiomyopathy.     a.     Echocardiogram, November 2006, showed ejection fraction of      30-40% with mild to moderate mitral regurgitation and mild aortic      regurgitation.     b.      Cardiac MRI, May of 2007, EF of 44% with 50% scar involving      the inferolateral walls.  No comment on mitral regurgitation.     c.  Last echo 3/11   EF 30-35% mod MR     d.  Negative T-wave alternans testing in May of 2007.     e. Altace stopped due to symptomatic hypotension. B-block limited due to bradycardia     f. s/p St. jude single chamber ICD     g. CPX 3/11 pVO2 32.5 slope 29.4 RER 1.18 (normal) 3. Chronic right bundle branch block.  Gout Hyperlipidemia AODM ED  Review of Systems       As per HPI and past medical history; otherwise all systems negative.   Vital Signs:  Patient profile:   60 year old male Height:      68 inches Weight:      186 pounds BMI:     28.38 Pulse rate:   55 / minute BP sitting:   96 / 58  (right arm) Cuff size:   regular  Vitals Entered By: Hardin Negus, RMA (April 10, 2010 8:34 AM)  Physical Exam  General:  Gen: well appearing. no resp difficulty HEENT: normal Neck: supple. no JVD. Carotids 2+ bilat; no bruits. No lymphadenopathy or thryomegaly appreciated. Cor: PMI  nondisplaced. Regular rate & rhythm. No rubs, gallops. soft MR murmur. Lungs: clear Abdomen: soft, nontender, nondistended.  Good bowel sounds. Extremities: no cyanosis, clubbing, rash, edema Neuro: alert & orientedx3, cranial nerves grossly intact. moves all 4 extremities w/o difficulty. affect pleasant     ICD Specifications Following MD:  Lewayne Ancheta, MD     Referring MD:  [facility] ICD Vendor:  St Jude     ICD Model Number:  [id number]     ICD Serial Number:  [id number] ICD DOI:  06/01/2008     ICD Implanting MD:  Lewayne Mitschke, MD  Lead 1:    Location: RV     DOI: 06/01/2008     Model #: [id number]     Serial #: [id number]     Status: active  Indications::  ICM   ICD Follow Up ICD Dependent:  No      Episodes Coumadin:  No  Brady Parameters Mode VVI     Lower Rate Limit:  40      Tachy Zones VF:  240     VT:  214     VT1:  181     Impression &  Recommendations:  Problem # 1:  CORONARY ARTERY BYPASS GRAFT, FOUR VESSEL, HX OF (ICD-V45.81) Stable. No evidence of ischemia. Continue current regimen.  Problem # 2:  SYSTOLIC HEART FAILURE, CHRONIC (ICD-428.22) Remains NYHA class I with well-controlled volume status. No ICD firings. Excellent functional capcity by CPX. Unable to titrate meds due to bradycardia and low BP.  Problem # 3:  HYPERLIPIDEMIA (ICD-272.4) Lipids look great. Continue current regimen.  Problem # 4:  MITRAL REGURGITATION (ICD-396.3) Stable. Continue to foloow with echo q66yrs.  Other Orders: EKG w/ Interpretation (93000)  Patient Instructions: 1)  Follow up in 6 months

## 2011-01-14 NOTE — Letter (Signed)
Summary: Device-Delinquent Phone Journalist, newspaper, Main Office  [address]   [place], [place] [address]   Phone: [phone]  Fax: [phone]     February 25, 2010 MRN: [id number]   DERREON CONSALVO [address] [place], [place]  [address]   Dear Mr. AKHTAR,  According to our records, you were scheduled for a device phone transmission on February 18, 2010.     We did not receive any results from this check.  If you transmitted on your scheduled day, please call us to help troubleshoot your system.  If you forgot to send your transmission, please send one upon receipt of this letter.  Thank you,   Architectural technologist [facility]

## 2011-01-14 NOTE — Letter (Signed)
Summary: Remote Device Check  [employer], Main Office  [address]   [place], [place] [address]   Phone: [phone]  Fax: [phone]     March 12, 2010 MRN: [id number]   Matthew Joyce [address] [place], [place]  [address]   Dear [name],   Your remote transmission was recieved and reviewed by your physician.  All diagnostics were within normal limits for you.  __X___Your next transmission is scheduled for: May 27, 2010.  Please transmit at any time this day.  If you have a wireless device your transmission will be sent automatically.     Sincerely,  Proofreader

## 2011-01-14 NOTE — Cardiovascular Report (Signed)
Summary: Office Visit   Office Visit   Imported By: Roderic Ovens 09/17/2010 14:30:42  _____________________________________________________________________  External Attachment:    Type:   Image     Comment:   External Document

## 2011-01-14 NOTE — Assessment & Plan Note (Signed)
Summary: cpx/kdc   Vital Signs:  Patient profile:   60 year old male Height:      68 inches Weight:      189.2 pounds BMI:     28.87 Pulse rate:   76 / minute BP sitting:   120 / 80  Vitals Entered By: Shary Decamp (March 13, 2010 1:33 PM) CC: cpx - fasting Is Patient Diabetic? No   History of Present Illness: CPX feels well except for a recent cold that he is getting over from some soreness from a recent R  shoulder scope  Preventive Screening-Counseling & Management      Drug Use:  no.    Current Medications (verified): 1)  Niaspan 1000 Mg Tbcr (Niacin (Antihyperlipidemic)) .... Take 1 Tablet By Mouth Once A Day 2)  Simvastatin 40 Mg Tabs (Simvastatin) .... Take 1 Tablet By Mouth At Bedtime 3)  Fish Oil   Oil (Fish Oil) .... 1000mg  3 Tabs Daily 4)  Aspirin 81 Mg Tbec (Aspirin) .... Take One Tablet By Mouth Daily 5)  Toprol Xl 25 Mg Xr24h-Tab (Metoprolol Succinate) .... 1/2 Tab Two Times A Day 6)  Atacand 4 Mg Tabs (Candesartan Cilexetil) .... Take 1 Tablet By Mouth Once A Day 7)  Zyrtec Allergy 10 Mg Tabs (Cetirizine Hcl) .... Once Daily As Needed  Allergies (verified): 1)  ! Sulfa 2)  Lipitor (Atorvastatin Calcium)  Past History:  Past Medical History: Gout Hyperlipidemia AODM ED cardiopulmonary exercise test 10/2009 normal 1. Coronary artery disease.     a.     Status post coronary artery bypass grafting in March 2006 by      Dr. Cornelius Moras. 2. Ischemic cardiomyopathy.     a.     Echocardiogram, November 2006, showed ejection fraction of      30-40% with mild to moderate mitral regurgitation and mild aortic      regurgitation.     b.     Cardiac MRI, May of 2007, EF of 44% with 50% scar involving      the inferolateral walls.  No comment on mitral regurgitation.     c.  Last echo 3/09   EF 30-35%     d.  Negative T-wave alternans testing in May of 2007.     e. Altace stopped due to symptomatic hypotension. B-block limited due to bradycardia     f. s/p St. jude  single chamber ICD 5. Chronic right bundle branch block.   Past Surgical History:  tonsilectomy as a child  Coronary artery bypass graft (02-2005)   ICD 06/01/2008 ORIF    L ankle  (Fx-1987) shoulder scope (rt shoulder) 02/2010  Family History: colon ca-- no prostate ca--no DM--no MI-- F (in his 10s), los a younger brother  who had cardiomyopathy   Social History: Reviewed history from 08/30/2009 and no changes required. 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 No smoking. ETOH-- socially 2 G kids  exercise-- walks routinely /day diet-- healthy mostly    Drug Use:  no  Review of Systems CV:  Denies chest pain or discomfort and swelling of feet. Resp:  Denies cough and wheezing. GI:  Denies bloody stools, nausea, and vomiting. GU:  Denies dysuria and hematuria. Psych:  Denies anxiety and depression.  Physical Exam  General:  alert, well-developed, and well-nourished.   Neck:  no thyromegaly.   Lungs:  normal respiratory effort, no intercostal retractions, no accessory muscle use, and normal breath sounds.  Heart:  normal rate, regular rhythm, no murmur, and no gallop.   Abdomen:  soft, non-tender, no distention, no masses, and no guarding.   Rectal:  external hemorrhoids noted. Normal sphincter tone. No rectal masses or tenderness. Prostate:  Prostate gland firm and smooth, no enlargement, nodularity, tenderness, mass, asymmetry or induration. Extremities:  no edema Psych:  Oriented X3, memory intact for recent and remote, normally interactive, good eye contact, not anxious appearing, and not depressed appearing.     Impression & Recommendations:  Problem # 1:  HEALTH SCREENING (ICD-V70.0) Td 2008 pneumonia shot 2009 01-2008: Cscope, tics, no polyps, next in 10 years doing well, cont.  with healthy lifestyle   Orders: Venipuncture ([address]) TLB-BMP (Basic Metabolic Panel-BMET) (80048-METABOL) TLB-CBC  Platelet - w/Differential (85025-CBCD) TLB-Hepatic/Liver Function Pnl (80076-HEPATIC) TLB-Lipid Panel (80061-LIPID) TLB-TSH (Thyroid Stimulating Hormone) (84443-TSH) TLB-PSA (Prostate Specific Antigen) (84153-PSA)  Problem # 2:  DIABETES MELLITUS, BORDERLINE (ICD-790.29) diet only,  due for labs  Labs Reviewed: Creat: 0.7 (03/02/2009)     Orders: TLB-A1C / Hgb A1C (Glycohemoglobin) (83036-A1C)  Complete Medication List: 1)  Niaspan 1000 Mg Tbcr (Niacin (antihyperlipidemic)) .... Take 1 tablet by mouth once a day 2)  Simvastatin 40 Mg Tabs (Simvastatin) .... Take 1 tablet by mouth at bedtime 3)  Fish Oil Oil (Fish oil) .... 1000mg  3 tabs daily 4)  Aspirin 81 Mg Tbec (Aspirin) .... Take one tablet by mouth daily 5)  Toprol Xl 25 Mg Xr24h-tab (Metoprolol succinate) .... 1/2 tab two times a day 6)  Atacand 4 Mg Tabs (Candesartan cilexetil) .... Take 1 tablet by mouth once a day 7)  Zyrtec Allergy 10 Mg Tabs (Cetirizine hcl) .... Once daily as needed  Patient Instructions: 1)  Please schedule a follow-up appointment in 6 months .    Preventive Care Screening  Prior Values:    PSA:  0.67 (03/02/2009)    Last Tetanus Booster:  Tdap (12/15/2006)    Last Flu Shot:  Fluvax 3+ (09/27/2009)    Last Pneumovax:  Pneumovax (03/07/2009)    Risk Factors:  Tobacco use:  quit    Year quit:  many years Drug use:  no Alcohol use:  yes    Drinks per day:  <1 Exercise:  yes    Times per week:  3 miles qd     Type:  walk

## 2011-01-16 NOTE — Letter (Signed)
Summary: Remote Device Check  [employer], Main Office  [address]   [place], [place] [address]   Phone: [phone]  Fax: [phone]     December 25, 2010 MRN: [id number]   Matthew Joyce [address] [place], [place]  [address]   Dear Mr. BOLIVER,   Your remote transmission was recieved and reviewed by your physician.  All diagnostics were within normal limits for you.  __X___Your next transmission is scheduled for:  03-20-2011.  Please transmit at any time this day.  If you have a wireless device your transmission will be sent automatically.  Sincerely,  Vella Kohler

## 2011-01-29 ENCOUNTER — Encounter: Payer: Self-pay | Admitting: Internal Medicine

## 2011-03-20 ENCOUNTER — Ambulatory Visit (INDEPENDENT_AMBULATORY_CARE_PROVIDER_SITE_OTHER): Payer: BC Managed Care – PPO | Admitting: *Deleted

## 2011-03-20 ENCOUNTER — Encounter: Payer: Self-pay | Admitting: Internal Medicine

## 2011-03-20 DIAGNOSIS — I428 Other cardiomyopathies: Secondary | ICD-10-CM

## 2011-03-20 DIAGNOSIS — R0989 Other specified symptoms and signs involving the circulatory and respiratory systems: Secondary | ICD-10-CM

## 2011-03-24 ENCOUNTER — Other Ambulatory Visit: Payer: Self-pay | Admitting: Internal Medicine

## 2011-03-25 NOTE — Progress Notes (Signed)
icd remote check  

## 2011-04-10 ENCOUNTER — Encounter: Payer: Self-pay | Admitting: Internal Medicine

## 2011-04-11 ENCOUNTER — Ambulatory Visit (INDEPENDENT_AMBULATORY_CARE_PROVIDER_SITE_OTHER)
Admission: RE | Admit: 2011-04-11 | Discharge: 2011-04-11 | Disposition: A | Discharge status: Home / Self Care | Payer: BC Managed Care – PPO | Source: Ambulatory Visit | Attending: Internal Medicine | Admitting: Internal Medicine

## 2011-04-11 ENCOUNTER — Telehealth: Payer: Self-pay | Admitting: Internal Medicine

## 2011-04-11 ENCOUNTER — Ambulatory Visit (INDEPENDENT_AMBULATORY_CARE_PROVIDER_SITE_OTHER): Payer: BC Managed Care – PPO | Admitting: Internal Medicine

## 2011-04-11 ENCOUNTER — Encounter: Payer: Self-pay | Admitting: Internal Medicine

## 2011-04-11 VITALS — BP 126/86 | HR 56 | Ht 70.0 in | Wt 185.2 lb

## 2011-04-11 DIAGNOSIS — Z0000 Encounter for general adult medical examination without abnormal findings: Secondary | ICD-10-CM | POA: Insufficient documentation

## 2011-04-11 DIAGNOSIS — R7309 Other abnormal glucose: Secondary | ICD-10-CM

## 2011-04-11 DIAGNOSIS — I2510 Atherosclerotic heart disease of native coronary artery without angina pectoris: Secondary | ICD-10-CM

## 2011-04-11 DIAGNOSIS — R0789 Other chest pain: Secondary | ICD-10-CM

## 2011-04-11 LAB — CBC WITH DIFFERENTIAL/PLATELET
Basophils Absolute: 0 10*3/uL (ref 0.0–0.1)
Eosinophils Relative: 1.9 % (ref 0.0–5.0)
Lymphs Abs: 1.4 10*3/uL (ref 0.7–4.0)
Monocytes Absolute: 0.6 10*3/uL (ref 0.1–1.0)
Monocytes Relative: 10.3 % (ref 3.0–12.0)
Neutrophils Relative %: 64.6 % (ref 43.0–77.0)
Platelets: 231 10*3/uL (ref 150.0–400.0)
RDW: 13.4 % (ref 11.5–14.6)
WBC: 6.3 10*3/uL (ref 4.5–10.5)

## 2011-04-11 LAB — BASIC METABOLIC PANEL
CO2: 29 mEq/L (ref 19–32)
Calcium: 9.6 mg/dL (ref 8.4–10.5)
Chloride: 104 mEq/L (ref 96–112)
Creatinine, Ser: 0.8 mg/dL (ref 0.4–1.5)
Sodium: 140 mEq/L (ref 135–145)

## 2011-04-11 LAB — PSA: PSA: 0.42 ng/mL (ref 0.10–4.00)

## 2011-04-11 LAB — LIPID PANEL
HDL: 51.8 mg/dL (ref >39.00)
LDL Cholesterol: 81 mg/dL (ref 0–99)
VLDL: 14.6 mg/dL (ref 0.0–40.0)

## 2011-04-11 LAB — HEMOGLOBIN A1C: Hgb A1c MFr Bld: 6.1 % (ref 4.6–6.5)

## 2011-04-11 NOTE — Telephone Encounter (Signed)
Please call cardiology, I request for him to be seen next week

## 2011-04-11 NOTE — Patient Instructions (Addendum)
I will ask cardiology to see you next week, if you don't hear from [place] in 4 days, let us know. If the chest tightness is severe, more frequent, more intense or persisting----> go to the ER   get your chest x-ray

## 2011-04-11 NOTE — Assessment & Plan Note (Addendum)
Td 2008 pneumonia shot 2009 History of herpes zoster ~  2007 01-2008: Cscope, tics, no polyps, next in 10 years doing well, cont.  with healthy lifestyle  labs

## 2011-04-11 NOTE — Assessment & Plan Note (Signed)
see history of the present illness, the patient has a three-week history of chest pain at rest. He is able to ride his bike for 55 minutes without symptoms. All this happened in the setting of increased stress and poor sleep. EKG today is at baseline. Plan: Will ask cardiology to see him next week Patient advised to go to the ER he seemed of severe, persistent, or crescendo.

## 2011-04-11 NOTE — Progress Notes (Signed)
Subjective:    Patient ID: Matthew Joyce, male    DOB: Sep 18, 1951, 60 y.o.   MRN: [id number]  HPI Complete physical exam The patient reports today 3 week history of on and off chest tightness. He denies pain, pressure or burning in the chest. This is happening every other day, may last a few hours, no associated nausea or vomiting. Sometimes he does feel slightly sweaty. Symptoms happened mostly at night/rest. He is able to ride his bike for 55 minutes without symptoms. At the same time, he recognizes a increase in stress, work related. He is definitely not sleeping  Past Medical History  Diagnosis Date  . CAD (coronary artery disease)     a- S/p cabg in march 2006 by Dr.Owen  . Ischemic cardiomyopathy     a-Echocardiogram, Nov 2006, showed ejection fraction of 30-40% with mild to moderated mitral  regurgitation and mild aortic regurgitation b- Cardiac MRI, May 2007, EF of 44% with 50% scar involving  the inferolateral walls. No comment of mitral regurgitation. c- last echo  3/11 EF 30-35%  mod MR d- Nega T-Wave alternans testing in May of 2007 e- Altace stopped due to symtomatic hypotension.   . Gout   . Hyperlipidemia   . DM (diabetes mellitus)   . ED (erectile dysfunction)    Past Surgical History  Procedure Date  . Tonsillectomy     as a child  . Coronary artery bypass graft 02-2005  . Orif ankle fracture 1987    left   . Shoulder arthroscopy 02/2010    rt   . Cardiac defibrillator placement 06/01/08    ICD   Family History  Problem Relation Age of Onset  . Heart attack Father 44  . Cardiomyopathy Brother   . Colon cancer Neg Hx   . Prostate cancer Neg Hx    History   Social History  . Marital Status: Married    Spouse Name: N/A    Number of Children: 2  . Years of Education: N/A   Occupational History  . 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     Social History Main Topics  . Smoking status: Never Smoker   . Smokeless  tobacco: Never Used  . Alcohol Use: 0.0 oz/week     socially  . Drug Use: No  . Sexually Active: Not on file   Other Topics Concern  . Not on file   Social History Narrative   Exercise-- bike 50 min / day.Marland KitchenMarland KitchenMarland KitchenDiet-- ok    Review of Systems  Constitutional: Negative for fever, fatigue and unexpected weight change.  Respiratory: Negative for cough and wheezing.   Cardiovascular: Negative for palpitations and leg swelling.  Gastrointestinal: Negative for nausea, abdominal pain, diarrhea and blood in stool.       No GERD  Genitourinary: Negative for dysuria, hematuria and difficulty urinating.       Objective:   Physical Exam  Constitutional: He is oriented to person, place, and time. He appears well-developed and well-nourished. No distress.  HENT:  Head: Normocephalic and atraumatic.  Eyes: No scleral icterus.  Neck: Normal range of motion. Neck supple. No thyromegaly present.       Normal carotid pulse   Cardiovascular: Normal rate, regular rhythm and normal heart sounds.        ?systolic murmur  Pulmonary/Chest: Effort normal and breath sounds normal. No respiratory distress. He has no wheezes. He has no rales.  Abdominal: Soft. Bowel sounds are  normal. He exhibits no distension and no mass. There is no tenderness. There is no rebound and no guarding.       No bruit  Genitourinary: Prostate normal.       + ext hemorrhoids  Musculoskeletal: He exhibits no edema.  Neurological: He is alert and oriented to person, place, and time.  Skin: Skin is warm and dry. He is not diaphoretic.  Psychiatric: He has a normal mood and affect. His behavior is normal. Judgment and thought content normal.          Assessment & Plan:  Anxiety, not sleeping well, counseled, declined sleeping med, will call if needed

## 2011-04-11 NOTE — Telephone Encounter (Signed)
Pt has an appt w/ Cardiology Tuesday May 1st at 12pm. Pt aware.

## 2011-04-11 NOTE — Assessment & Plan Note (Signed)
Treated with diet only. He seems to be doing great. Labs

## 2011-04-13 ENCOUNTER — Encounter: Payer: Self-pay | Admitting: Internal Medicine

## 2011-04-13 ENCOUNTER — Encounter: Payer: Self-pay | Admitting: *Deleted

## 2011-04-15 ENCOUNTER — Ambulatory Visit (INDEPENDENT_AMBULATORY_CARE_PROVIDER_SITE_OTHER): Payer: BC Managed Care – PPO | Admitting: Physician Assistant

## 2011-04-15 ENCOUNTER — Encounter: Payer: Self-pay | Admitting: Physician Assistant

## 2011-04-15 VITALS — BP 132/68 | HR 53 | Ht 69.0 in | Wt 190.4 lb

## 2011-04-15 DIAGNOSIS — Z5689 Other problems related to employment: Secondary | ICD-10-CM

## 2011-04-15 DIAGNOSIS — Z566 Other physical and mental strain related to work: Secondary | ICD-10-CM

## 2011-04-15 DIAGNOSIS — I5022 Chronic systolic (congestive) heart failure: Secondary | ICD-10-CM

## 2011-04-15 DIAGNOSIS — R079 Chest pain, unspecified: Secondary | ICD-10-CM | POA: Insufficient documentation

## 2011-04-15 DIAGNOSIS — I2510 Atherosclerotic heart disease of native coronary artery without angina pectoris: Secondary | ICD-10-CM

## 2011-04-15 MED ORDER — NITROGLYCERIN 0.4 MG SL SUBL: 0.4000 mg | SUBLINGUAL_TABLET | SUBLINGUAL | Status: DC | PRN

## 2011-04-15 MED ORDER — FAMOTIDINE 20 MG PO TABS: 20.0000 mg | ORAL_TABLET | Freq: Two times a day (BID) | ORAL | Status: DC

## 2011-04-15 NOTE — Progress Notes (Signed)
History of Present Illness: Primary Cardiologist:  [name] Primary Electrophysiologist:  [name] is a 60 y.o. male with a history of a coronary artery disease status post bypass surgery in 2006.  He also had mod LV dysfunction. EF 30-35% in March 2009.There is also history of ischemic mitral regurgitation.  Remainder of medical history is for diabetes, hyperlipidemia and bradycardia. Ace-I previously stopped due to hypotension.  B-blocker dosing limited by bradycardia.  He presents back today for evaluation of chest pain.  Over the last 2-3 weeks, he notes occasional chest tightness.  This occurs at rest.  It mainly occurs with emotional stress.  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  He also notes it at night when he lays flat.  He will awaken in the middle of the night with anxiety about his business and also notice chest tightness.  He thinks that he may have some shortness of breath associated.  He does note associated diaphoresis.  He denies associated nausea, arm or jaw symptoms or syncope.  He exercises on a daily basis.  He uses an [employer] bicycle and will exercise for 15 minutes and go 13 miles.  He denies any chest discomfort with this.  He denies any significant shortness of breath, arm or jaw symptoms, nausea or diaphoresis.  He denies orthopnea, PND, edema or weight gain.  Past Medical History  Diagnosis Date  . CAD (coronary artery disease)     a- S/p cabg in march 2006 by Dr.Owen  . Ischemic cardiomyopathy     a-Echocardiogram, Nov 2006, showed ejection fraction of 30-40% with mild to moderated mitral  regurgitation and mild aortic regurgitation b- Cardiac MRI, May 2007, EF of 44% with 50% scar involving  the inferolateral walls. No comment of mitral regurgitation. c- last echo  11/10: EF 30-35%  mod MR d- Nega T-Wave alternans testing in May of 2007 e- no ACE due to low BP;  f. CPX 3/11: normal  . Gout   .  Hyperlipidemia   . DM (diabetes mellitus)   . ED (erectile dysfunction)   . Bradycardia     Use of beta blocker limited  . 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 Systolic CHF, chronic   . AICD (automatic cardioverter/defibrillator) present 6/09    St. Jude    Current Outpatient Prescriptions  Medication Sig Dispense Refill  . aspirin 81 MG tablet Take 81 mg by mouth daily.        . candesartan (ATACAND) 4 MG tablet Take 4 mg by mouth daily.        . cetirizine (ZYRTEC) 10 MG tablet Take 10 mg by mouth daily.        . Fish Oil OIL Take 1,000 mg by mouth 3 (three) times daily.       . metoprolol (TOPROL-XL) 25 MG 24 hr tablet 1/2 tab two times a day.       . niacin (NIASPAN) 1000 MG CR tablet Take 1,000 mg by mouth at bedtime.        . simvastatin (ZOCOR) 40 MG tablet Take 40 mg by mouth at bedtime.          Allergies  Allergen Reactions  . Atorvastatin     REACTION: myalgia  . Sulfonamide Derivatives     History  Substance Use Topics  . Smoking status: Former Games developer  . Smokeless tobacco: Never Used  .  Alcohol Use: 0.0 oz/week     socially    ROS:  He denies fevers.  He does have occasional chills.  This is not unusual for him.  He denies significant cough.  He denies dysphagia, dyspepsia, melena, hematochezia.  He denies nausea or vomiting or diarrhea.  All other systems reviewed and negative.  Vital Signs: BP 132/68  Pulse 53  Ht 5\' 9"  (1.753 m)  Wt 190 lb 6.4 oz (86.365 kg)  BMI 28.12 kg/m2  PHYSICAL EXAM: Well nourished, well developed, in no acute distress HEENT: normal Neck: no JVD Endocrine: No thyromegaly Vascular: No carotid bruits bilaterally Cardiac:  normal S1, S2; RRR; no murmur Lungs:  clear to auscultation bilaterally, no wheezing, rhonchi or rales Abd: soft, nontender, no hepatomegaly Ext: no edema Skin: warm and dry Neuro:  CNs 2-12 intact, no focal abnormalities noted Psych: Normal affect  EKG:  Sinus  bradycardia, heart rate 53, normal axis, right bundle branch block, inferior Q waves, nonspecific ST-T wave changes; No significant change when compared to prior tracings.  ASSESSMENT AND PLAN:

## 2011-04-15 NOTE — Patient Instructions (Signed)
Your physician recommends that you schedule a follow-up appointment in: 2 weeks with Dr Gala Romney or [name] on a day Dr Gala Romney is in the office  Your physician has requested that you have en exercise stress myoview. For further information please visit [email or web address] Please follow instruction sheet, as given. Will have your device cut off prior to testing Please hold metoprolol the morning of your test  Your physician has recommended you make the following change in your medication: start Pepcid daily for 2 weeks then take as needed after.

## 2011-04-15 NOTE — Assessment & Plan Note (Signed)
As noted, he will undergo Myoview testing.  Follow up with Dr. Gala Romney or myself in 2 weeks.

## 2011-04-15 NOTE — Assessment & Plan Note (Signed)
Volume appears stable.  He does not appear to have any symptoms of congestive heart failure that would be contributing to his symptoms.

## 2011-04-15 NOTE — Assessment & Plan Note (Signed)
Question if this is the cause of his symptoms.  He is currently not taking anything for anxiety.  If his cardiac workup is negative, he may want to consider this.

## 2011-04-15 NOTE — Assessment & Plan Note (Signed)
Symptoms are atypical.  He has not had any symptoms reminiscent of his previous angina prior to his bypass.  He is able to exercise on a daily basis for quite a while without chest discomfort.  His EKG is unchanged.  However, his symptoms are quite concerning for him.  It has been over 5 years since his bypass.  I will have him undergo a GXT Myoview.  He will continue on aspirin.  He does note some symptoms with lying flat.  I question whether or not he may have some atypical symptoms of acid reflux.  I've given him a prescription for Pepcid 20 mg to take twice a day.  He can do this for 2 weeks.  If he has no improvement in his symptoms, he can stop.  I have also renewed his nitroglycerin for him.  He knows to contact us if his symptoms worsen or to the emergency room if they become reminiscent of his previous angina.

## 2011-04-21 ENCOUNTER — Ambulatory Visit (HOSPITAL_COMMUNITY): Payer: BC Managed Care – PPO | Attending: Internal Medicine | Admitting: Radiology

## 2011-04-21 DIAGNOSIS — I25810 Atherosclerosis of coronary artery bypass graft(s) without angina pectoris: Secondary | ICD-10-CM

## 2011-04-21 DIAGNOSIS — R079 Chest pain, unspecified: Secondary | ICD-10-CM

## 2011-04-21 DIAGNOSIS — I4510 Unspecified right bundle-branch block: Secondary | ICD-10-CM

## 2011-04-21 DIAGNOSIS — R0789 Other chest pain: Secondary | ICD-10-CM

## 2011-04-21 DIAGNOSIS — I2510 Atherosclerotic heart disease of native coronary artery without angina pectoris: Secondary | ICD-10-CM | POA: Insufficient documentation

## 2011-04-21 DIAGNOSIS — I2589 Other forms of chronic ischemic heart disease: Secondary | ICD-10-CM

## 2011-04-21 MED ORDER — TECHNETIUM TC 99M TETROFOSMIN IV KIT
33.0000 | PACK | Freq: Once | INTRAVENOUS | Status: AC | PRN
  Administered 2011-04-21: 33 via INTRAVENOUS

## 2011-04-21 MED ORDER — TECHNETIUM TC 99M TETROFOSMIN IV KIT
10.6000 | PACK | Freq: Once | INTRAVENOUS | Status: AC | PRN
  Administered 2011-04-21: 11 via INTRAVENOUS

## 2011-04-21 NOTE — Progress Notes (Signed)
[facility] SITE 3 NUCLEAR MED [address] [place] [address] [phone]  Cardiology Nuclear Med Study  Matthew Joyce is a 60 y.o. male [id number] 1951/06/09   Nuclear Med Background Indication for Stress Test:  Evaluation for Ischemia and Graft Patency History:  '06 CABG x4, '07 CT/MRI, 06/09 Defibrillator: ICM, 11/10 Echo EF 30-35%, '07 GXT, '06 Heart Catheterization and Myocardial Perfusion Study EF 33% (+) scar and ischemia anteroseptal and anterolateral Cardiac Risk Factors: History of Smoking, Lipids and RBBB  Symptoms:  Chest Pain, Chest Tightness, Diaphoresis, and SOB   Nuclear Pre-Procedure Caffeine/Decaff Intake:  None NPO After: 7:00pm   Lungs: clear IV 0.9% NS with Angio Cath:  20g  IV Site: R Hand  IV Started by:  Cathlyn Parsons, RN  Chest Size (in):  42 Cup Size: n/a  Height: 5\' 9"  (1.753 m)  Weight:  185 lb (83.915 kg)  BMI:  Body mass index is 27.32 kg/(m^2). Tech Comments:  Metoprolol held x 12hrs. Defibrillator turned off while the patient walked on the treadmill and turned back on in recovery without any problems.     Nuclear Med Study 1 or 2 day study: 1 day  Stress Test Type:  Stress  Reading MD: Charlton Haws, MD  Order Authorizing Provider:  D.Bensimhon  Resting Radionuclide: Technetium 20m Tetrofosmin  Resting Radionuclide Dose: 10.6 mCi   Stress Radionuclide:  Technetium 66m Tetrofosmin  Stress Radionuclide Dose: 33 mCi           Stress Protocol Rest HR: 51 Stress HR: 150  Rest BP: 117/70 Stress BP: 178/82  Exercise Time (min): 11:30 METS: 13.4   Predicted Max HR: 160 bpm % Max HR: 93.75 bpm Rate Pressure Product: 86578   Dose of Adenosine (mg):  n/a Dose of Lexiscan: n/a mg  Dose of Atropine (mg): n/a Dose of Dobutamine: n/a mcg/kg/min (at max HR)  Stress Test Technologist: Milana Na, EMT-P  Nuclear Technologist:  Doyne Keel, CNMT     Rest Procedure:  Myocardial perfusion imaging was performed at rest  45 minutes following the intravenous administration of Technetium 71m Tetrofosmin. Rest ECG: Sinus Bradycardia with PVCS and a RBBB  Stress Procedure:  The patient exercised for 11:30.  The patient stopped due to fatigue and denied any chest pain.  There were no significant ST-T wave changes and occ pvcs/pacs injected at peak exercise and myocardial perfusion imaging was performed after a brief delay. Stress ECG: No significant change from baseline ECG  QPS Raw Data Images:  Normal; no motion artifact; normal heart/lung ratio. Stress Images:  There is decreased uptake in the lateral wall. Rest Images:  There is decreased uptake in the lateral wall. Subtraction (SDS):  There is a fixed defect that is most consistent with a previous infarction. Transient Ischemic Dilatation (Normal <1.22):  0.95 Lung/Heart Ratio (Normal <0.45):  0.40  Quantitative Gated Spect Images QGS EDV:  208 ml QGS ESV:  133 ml QGS cine images:  Inferolateral and septal hypokinesis QGS EF: 36%  Impression Exercise Capacity:  Good exercise capacity. BP Response:  Normal blood pressure response. Clinical Symptoms:  Mild chest pain/dyspnea. ECG Impression:  No significant ST segment change suggestive of ischemia. Comparison with Prior Nuclear Study: No images to compare  Overall Impression:  [place] inferolateral wall infarct from apex to base with no ischemia      Charlton Haws

## 2011-04-22 NOTE — Progress Notes (Signed)
Routed to Dr. Gala Romney.Matthew Joyce

## 2011-04-23 ENCOUNTER — Telehealth: Payer: Self-pay | Admitting: Physician Assistant

## 2011-04-23 NOTE — Telephone Encounter (Signed)
Please notify patient his stress test is consistent with his prior MI and EF is consistent with prior EF. No changes.

## 2011-04-23 NOTE — Telephone Encounter (Signed)
Ok

## 2011-04-23 NOTE — Telephone Encounter (Signed)
lvmom for ptcb for myoview results. Danielle Rankin

## 2011-04-24 NOTE — Telephone Encounter (Signed)
lvmom for ptcb for myoview results. Matthew Joyce  

## 2011-04-24 NOTE — Telephone Encounter (Signed)
Test result

## 2011-04-28 NOTE — Telephone Encounter (Signed)
Pt aware of myoview results today. Matthew Joyce  

## 2011-04-29 ENCOUNTER — Encounter: Payer: Self-pay | Admitting: Physician Assistant

## 2011-04-29 NOTE — Op Note (Signed)
NAMEEEAN, BUSS NO.:  [id number]   MEDICAL RECORD NO.:  [id number]          PATIENT TYPE:  INP   LOCATION:  3729                         FACILITY:  [facility]   PHYSICIAN:  Doylene Canning. Ladona Ridgel, MD    DATE OF BIRTH:  02/20/1951   DATE OF PROCEDURE:  06/01/2008  DATE OF DISCHARGE:                               OPERATIVE REPORT   PROCEDURE PERFORMED:  Implantation of a single chamber ICD utilizing  left upper extremity venography.   OPERATIVE INDICATIONS:  The patient is a very pleasant 60 year old male  with a long-standing coronary disease status post myocardial infarction  with severe LV dysfunction with EF 30% status post bypass surgery in  2006.  The patient has class I-II heart failure symptoms.  Most recent  echo demonstrated an EF of 30%.  He is now referred for prophylactic ICD  insertion.   OPERATIVE PROCEDURE:  After informed consent was obtained, the patient  was taken to the diagnostic EP lab in fasting state.  After usual  preparation and draping, intravenous fentanyl and midazolam was given  for sedation.  A 30 mL of lidocaine was infiltrated in the left  infraclavicular region.  A 6-cm incision was carried out over this  region.  Electrocautery was utilized to dissect down to the fascial  plane.  Initial attempts to puncture the left subclavian vein were  unsuccessful.  The cephalic vein was dissected down and found to be very  small.  At this point, 10 mL of contrast was injected into the left  upper extremity venous system demonstrating a patent left subclavian  vein.  The vein was smaller than usual and directed more inferiorly.  It  was successfully punctured and the St. Jude model 7121 active fixation  defibrillation lead, serial number [id number] was advanced into the right  ventricle.  Mapping was carried out.  Mapping was carried out throughout  the RV septum, RV apex, and RV outflow tract areas.  R-waves were very  small and general.  At the  final site, the R-waves were initially 8-9 to  the analyzer and with lead actively fixed turned out to be approximately  5-7.  There was a large injury current and the threshold was 1 volt at  0.5 milliseconds.  A 10-volt pacing did not stimulate the diaphragm.  With these satisfactory parameters, the leads was secured to  subpectoralis fascia with a figure-of-8 silk suture.  The sewing sleeve  was also secured with silk suture.  Electrocautery was utilized to make  subcutaneous pocket.  Kanamycin irrigation was utilized to irrigate the  pocket.  Electrocautery utilized to assure hemostasis.  At this point,  the St. Jude current VRRF single-chamber defibrillator serial number  [id number] was connected to the defibrillation lead and placed back in  subcutaneous pocket.  The generator was secured with silk suture.  Kanamycin irrigation was utilized to irrigate the pocket.  Defibrillation threshold testing carried out.   After the patient was more deeply sedated with fentanyl and Versed, VF  was induced with a T-wave shock.  A 15 J shock was then  delivered,  terminating VF and restoring sinus rhythm.  At this point, no additional  defibrillation threshold testing was carried out.  The incision closed  with layer of 2-0 Vicryl followed by layer of 3-0 Vicryl.  Benzoin was  painted on the skin, Steri-Strips were applied, pressure dressing was  placed, and the patient was returned to his room in satisfactory  condition.   COMPLICATIONS:  There were no immediate procedure complications.   RESULTS:  This demonstrate successful implantation of the St. Jude  single chamber defibrillator in a patient with an ischemic  cardiomyopathy, EF 30%, class I-II heart failure.      Doylene Canning. Ladona Ridgel, MD  Electronically Signed     GWT/MEDQ  D:  06/01/2008  T:  06/01/2008  Job:  [id number]   cc:   Willow Ora, MD

## 2011-04-29 NOTE — Assessment & Plan Note (Signed)
[facility]                            CARDIOLOGY OFFICE NOTE   NAME:Matthew Joyce                      MRN:          [id number]  DATE:08/19/2007                            DOB:          1951/03/15    PRIMARY CARE PHYSICIAN:  Willow Ora, M.D.   INTERVAL HISTORY:  Matthew Joyce is a delightful 60 year old male who  returns for routine followup.  He has a history of coronary artery  disease.  He is status post bypass surgery in March 2006.  He has mild  LV dysfunction with an EF of approximately 45%.  He also has a history  of hyperlipidemia with cholesterol currently at goal.   He is doing great.  He is walking 3.2 miles a day at a brisk pace  without any chest pain or shortness of breath.  He is watching his diet.  He really has had no complaints.   CURRENT MEDICATIONS:  1. Niaspan 1,000 mg q.h.s.  2. Omega-3 fish oil.  3. Toprol XL 25.  4. Simvastatin 40.  5. Aspirin 81.   PHYSICAL EXAMINATION:  GENERAL:  He is well-appearing, no acute  distress.  He ambulates around the clinic without any respiratory  difficulty.  VITAL SIGNS:  Blood pressure is 98/60, heart rate is 50, weight is 178.  HEENT:  Sclerae are anicteric.  EOMI.  There is no xanthelasma.  Mucous  membranes are moist.  Oropharynx clear.  NECK:  Supple.  There is no JVD.  Carotids are 2 plus bilaterally  without any bruits.  There is no lymphadenopathy or thyromegaly.  HEART:  PMI is nondisplaced.  He is bradycardic and regular.  No  murmurs, rubs, or gallops.  LUNGS:  Clear.  ABDOMEN:  Soft, nontender, nondistended.  No hepatosplenomegaly.  No  bruits.  No masses.  Good bowel sounds.  EXTREMITIES:  Warm with no cyanosis, clubbing, or edema.  No rash.  NEUROLOGIC:  He is alert and oriented x3.  Cranial nerves II-XII are  intact.  He moves all 4 extremities without difficulty.  Affect is  pleasant.   EKG shows a sinus bradycardia with a sinus arrhythmia at a rate of 50.  There is  a right bundle branch block.  Most recent lipids show a total  cholesterol of 110, triglycerides 49, HDL 44, and LDL of 57.   ASSESSMENT/PLAN:  1. Coronary artery disease, status post coronary artery bypass graft.      He is doing quite well without any evidence of ischemia.  Continue      current therapy.  2. Left ventricular dysfunction.  This is mild.  Continue current      therapy.  Unfortunately, blood pressure is too low to tolerate ACE      inhibitors at this point.  3. Bradycardia, suspect this is related to his good aerobic fitness,      however, he does have some evidence of conduction system disease      with a right bundle branch block.  We will continue to follow.  No      need for a pacemaker at  this time.   DISPOSITION:  I will see him back for routine followup in 6 months.     Matthew Buckles. Bensimhon, MD  Electronically Signed    DRB/MedQ  DD: 08/19/2007  DT: 08/19/2007  Job #: [id number]   cc:   Willow Ora, MD

## 2011-04-29 NOTE — Assessment & Plan Note (Signed)
[facility]                            CARDIOLOGY OFFICE NOTE   NAME:Matthew Joyce, Matthew Joyce                      MRN:          [id number]  DATE:02/21/2008                            DOB:          01-31-1951    INTERVAL HISTORY:  Matthew Joyce is a delightful 60 year old male with a history  of a coronary artery disease status post bypass surgery in 2006.  He  also had mild LV dysfunction.  Initially it was 30% now up to 45%.  There is also history of ischemic mitral regurgitation.  Remainder of  medical history is for hyperlipidemia and bradycardia.   He returns today for routine followup.  He is doing great.  He is  walking at least three miles a day at a brisk pace without any chest  pain or shortness of breath.  No heart failure symptoms.  He has been  compliant with all his medications.   CURRENT MEDICATIONS:  1. Fish oil.  2. Niaspan 1000 a day.  3. Toprol XL 25 a day.  4. Simvastatin 40 a day.  5. Aspirin 81 a day.  He is not on ACE inhibitor due to low blood pressure.   PHYSICAL EXAM:  He is well-appearing and in no acute distress. Ambulates  around the clinic without respiratory difficulty.  Blood pressure was  102/64, heart rate was 51. Weight is 183.  HEENT: Normal.  NECK: is supple. No JVD. Carotids are 2+ bilaterally without bruits.  There is no lymphadenopathy, thyromegaly.  CARDIAC: PMI is nondisplaced. He is a bradycardic and regular. No  murmurs, rubs or gallops.  LUNGS:  Are clear.  ABDOMEN: Soft, nontender, nondistended. No hepatosplenomegaly. No  bruits. No masses. Good bowel sounds. Aortic impulse is not wide.  EXTREMITIES: Are warm with no cyanosis, clubbing or edema. No rash.  NEURO: He is alert and oriented x3. Cranial nerves II-XII are intact.  Moves all 4 extremities without difficulty. Affect is pleasant.   EKG shows sinus bradycardia rate of 51 with a right bundle branch block.  No ST-T wave abnormalities.   ASSESSMENT/PLAN:  1. Coronary artery disease status post coronary artery bypass      grafting.  He is doing well.  No evidence of ischemia.  Continue      current therapy.  2. Left ventricular dysfunction.  Last echocardiogram was in 2006.  He      is overdue for followup.  We will schedule an echo to reevaluate.      Obviously, would like to have him on an ACE inhibitor if possible,      but his blood pressure has been too low to tolerate this.  If there      is any evidence of negative remodeling we will try to push him and      squeak on a low-dose ACE      inhibitor.  3. Hyperlipidemia, goal LDL is less than 70 and will check his lipids      today.     [name], MD  Electronically Signed    DRB/MedQ  DD: 02/21/2008  DT:  02/21/2008  Job #: [id number]

## 2011-04-29 NOTE — Discharge Summary (Signed)
Matthew Joyce, Matthew Joyce NO.:  [id number]   MEDICAL RECORD NO.:  [id number]          PATIENT TYPE:  INP   LOCATION:  3729                         FACILITY:  [facility]   PHYSICIAN:  Doylene Canning. Ladona Ridgel, MD    DATE OF BIRTH:  1951/11/18   DATE OF ADMISSION:  06/01/2008  DATE OF DISCHARGE:  06/02/2008                               DISCHARGE SUMMARY   This patient has no known drug allergies.   REFERRING PHYSICIAN:  [name], MD.   FINAL DIAGNOSES:  1. Discharging day 1 status post implant of St. Jude Current single-      chamber cardioverter-defibrillator.  2. Echocardiogram this year shows decline ejection fraction of 30%.   SECONDARY DIAGNOSES:  1. Ischemic cardiomyopathy, history of myocardial infarction.  2. Three-vessel coronary artery disease status post coronary artery      bypass graft surgery March 2006.  3. New York Heart Association classroom II chronic systolic congestive      heart failure.  4. Dyslipidemia.   PROCEDURE:  June 01, 2008, implant St. Jude Current single-chamber  cardioverter-defibrillator, Dr. Lewayne Mitrano.  Defibrillator threshold  study less than 15 J.  The patient did not have any postprocedural  hematoma.  Chest x-ray shows no pneumothorax.  The lead is in  appropriate position.  The device has been interrogated and all values  within normal limits.  The patient discharging postprocedure day #1.  He  goes home with the following.   HISTORY:  Matthew Joyce is a 60 year old male.  He follows with [name].  He is referred by Dr. Gala Romney for consideration of ICD.   The patient has a history of coronary artery disease and previous  myocardial infarction.  At the time of surgery, his echocardiogram  showed ejection fraction of about 45%.  Recent echocardiogram shows that  his ejection fraction has declined to 30%.  He has no previous syncope  and his New York Heart Association class heart failure is II.   The  patient is stable, but according to the MADIT II criteria, the  patient is candidate for [facility] cardioverter-defibrillator.  The risks and  benefits have been described to the patient.   HOSPITAL COURSE:  The patient presents electively June 01, 2008.  He  underwent implantation of device the St. Jude Current single-chamber  cardioverter-defibrillator by Dr. Ladona Ridgel the same day, discharging June 02, 2008.  The patient was asked to keep his incision dry for the next 7  days to sponge bathe until Thursday, June 08, 2008.   MEDICATIONS AT DISCHARGE:  1. Enteric-coated aspirin 81 mg daily.  2. Simvastatin 40 mg daily at bedtime.  3. Metoprolol 12.5 mg twice daily.  4. Niaspan 1000 mg daily at bedtime.  5. Omega-3 fish oil caps 1000 mg 2 tablets every morning.   He follows up with [facility] Monday, June 19, 2008,  at 9:20, sees Dr. Ladona Ridgel Tuesday, September 26, 2008, at 9 o'clock.      Maple Mirza, PA      Doylene Canning. Ladona Ridgel, MD  Electronically Signed  GM/MEDQ  D:  06/02/2008  T:  06/02/2008  Job:  [id number]

## 2011-04-29 NOTE — Assessment & Plan Note (Signed)
[facility]                         ELECTROPHYSIOLOGY OFFICE NOTE   NAME:Zollars, IRIS TATSCH                      MRN:          [id number]  DATE:04/12/2008                            DOB:          1951-08-18    HISTORY:  Mr. Hearns is referred today by [name] for  evaluation and consideration for prophylactic ICD insertion.  The  patient is a very pleasant middle-aged man who has coronary artery  disease with severe LV dysfunction.  Initially he was found to have  three vessel disease and moderate LV dysfunction and underwent bypass  surgery in March of 2006.  He is undergoing cardiac rehab and has done  quite well with his ability to exercise on a regular basis.  His heart  failure is class I to II.  He, however, on repeat 2-D echo demonstrates  that his EF has gone from 45% down to 30%.  He had no syncope and had no  other specific complaints except that he has had some trouble sleeping.   CURRENT MEDICATIONS:  1. His current medications include aspirin 81 mg daily.  2. Simvastatin 40 day.  3. Metoprolol half tablet 25 mg twice daily.  4. Niaspan 1 gram daily.  5. He had previously been on Altace but this was discontinued I think      secondary to a cough, secondary to low blood pressure.   ADDITIONAL PAST MEDICAL HISTORY:  Is notable for dyslipidemia.   FAMILY HISTORY:  Is noncontributory.   SOCIAL HISTORY:  The patient is married.  He denies tobacco or ethanol  abuse.   REVIEW OF SYSTEMS:  Is negative except as noted in the HPI.   PHYSICAL EXAMINATION:  GENERAL:  He is a pleasant, well-appearing middle-  aged man in no acute distress.  VITAL SIGNS:  Blood pressure was 106/56, the pulse 56 and regular, the  respirations were 18, weight was 183 pounds.  HEENT:  Normocephalic and atraumatic.  Pupils are equal and round.  Oropharynx moist.  Sclerae anicteric.  NECK:  The neck revealed no jugular venous distention.  There is no  thyromegaly.  Trachea is midline.  Carotids are 2+ and symmetric.  LUNGS:  Clear bilaterally to auscultation.  No wheezes, rales or rhonchi  are present.  There is no increased work of breathing.  CARDIOVASCULAR:  Exam revealed a regular rate and normal.  Normal S1 and  S2.  PMI was enlarged and laterally displaced.  ABDOMEN:  Soft, nontender, nondistended.  EXTREMITIES:  Demonstrate no edema.  NEUROLOGIC:  Alert and oriented x2.  Cranial nerves intact.  Strength is  5/5 and symmetric.   IMPRESSION:  1. Ischemic cardiomyopathy with EF (ejection fraction) of 30%.  2. Class I to II heart failure.  3. Status post MI (myocardial infarction).   DISCUSSION:  Mr. Neal is stable but his LV function has dropped.  He  has a clear indication for prophylactic ICD insertion based on the MADIT  II study.  I have discussed the risks, benefits, goals and expectations  of ICD insertion and he is plans to schedule having this done  in the  next month or two as his schedule permits.     Doylene Canning. Ladona Ridgel, MD  Electronically Signed    GWT/MedQ  DD: 04/12/2008  DT: 04/12/2008  Job #: [id number]

## 2011-04-29 NOTE — Assessment & Plan Note (Signed)
 HEALTHCARE                         ELECTROPHYSIOLOGY OFFICE NOTE   NAME:Matthew Joyce, Matthew Joyce                      MRN:          [id number]  DATE:09/26/2008                            DOB:          1951/02/18    Matthew Joyce returns today for followup.  He is a very pleasant middle-  aged male with an ischemic cardiomyopathy, congestive heart failure,  coronary artery disease, dyslipidemia who underwent ICD insertion back  in June with a St. Jude single-chamber device placed at that time.  He  returns today for followup.  He had no specific complaints today.  He  denies chest pain or shortness of breath.  He has recently been told he  is borderline diabetic and wonders about his Niaspan.   Medications today include:  1. Aspirin 81 a day.  2. Niaspan 1 g daily.  3. Toprol-XL 25 daily in divided doses.  4. Simvastatin 40 a day.  5. Multiple vitamin.   On exam, he is a pleasant well-appearing man in no distress.  Blood  pressure was 104/76, pulse 80 and regular, respirations were 18, the  weight was 191 pounds.  Neck revealed no jugular venous distention.  Lungs were clear bilaterally to auscultation without wheezes, rales, or  rhonchi are present.  Cardiovascular exam revealed regular rate and  rhythm, normal S1 and S2.  Extremities demonstrated no edema.   INTERROGATION:  His defibrillator demonstrates St. Jude Current device.  R-waves were 80, the impedance 550, the threshold was 0.5 at 0.5.  The  battery voltage was greater than 3.2 volts.  There were no intercurrent  IC therapies.  Today, we turned his outputs down to the ventricle to  2.5.  There is morphology template was, in fact, updated.   IMPRESSION:  1. Ischemic cardiomyopathy.  2. Congestive heart failure.  3. Status post implantable cardioverter-defibrillator insertion.  4. Borderline diabetes.  5. Dyslipidemia.   DISCUSSION:  Matthew Joyce is stable.  His defibrillator is working  normally.  I have told that it would be okay to decrease his dose of  Niaspan from a gram to 500 mg daily, and hopefully, his glucose  intolerance will improve.     Doylene Canning. Ladona Ridgel, MD  Electronically Signed   GWT/MedQ  DD: 09/26/2008  DT: 09/26/2008  Job #: [id number]

## 2011-04-30 ENCOUNTER — Ambulatory Visit (INDEPENDENT_AMBULATORY_CARE_PROVIDER_SITE_OTHER): Payer: BC Managed Care – PPO | Admitting: Physician Assistant

## 2011-04-30 ENCOUNTER — Encounter: Payer: Self-pay | Admitting: Physician Assistant

## 2011-04-30 VITALS — BP 128/76 | HR 56 | Ht 69.5 in | Wt 186.0 lb

## 2011-04-30 DIAGNOSIS — R079 Chest pain, unspecified: Secondary | ICD-10-CM

## 2011-04-30 NOTE — Patient Instructions (Signed)
Your physician recommends that you schedule a follow-up appointment in: 4-6 WEEKS WITH DR. Gala Romney AS PER DR. Gala Romney.  Your physician recommends that you continue on your current medications as directed. Please refer to the Current Medication list given to you today.

## 2011-04-30 NOTE — Progress Notes (Signed)
History of Present Illness: Primary Cardiologist:  [name] Primary Electrophysiologist:  [name] is a 60 y.o. male with a history of a coronary artery disease status post bypass surgery in 2006.  He also had mod LV dysfunction. EF 30-35% in March 2009. He is s/p ICD.  There is also history of ischemic mitral regurgitation.  Remainder of medical history is for diabetes, hyperlipidemia and bradycardia. Ace-I previously stopped due to hypotension.  B-blocker dosing limited by bradycardia.  I saw him 2 weeks ago for chest pain.  We had him do a GXT Myoview.  He walked for over 11:00 and had no chest pain.  He had an old scar on his images but no ischemia.  His EF is unchanged.  He still has tightness with emotional stress.  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  He denies DOE, orthopnea, PND, edema or syncope.  He denies any symptoms like his angina.  He tried Pepcid without relief.  Past Medical History  Diagnosis Date  . CAD (coronary artery disease)     a- S/p cabg in march 2006 by Dr.Owen;  b. myoview 5/12: large IL scar from apex to base, no ischemia, EF 37%  . Ischemic cardiomyopathy     a-Echocardiogram, Nov 2006, showed ejection fraction of 30-40% with mild to moderated mitral  regurgitation and mild aortic regurgitation b- Cardiac MRI, May 2007, EF of 44% with 50% scar involving  the inferolateral walls. No comment of mitral regurgitation. c- last echo  11/10: EF 30-35%  mod MR d- Nega T-Wave alternans testing in May of 2007 e- no ACE due to low BP;  f. CPX 3/11: normal  . Gout   . Hyperlipidemia   . DM (diabetes mellitus)   . ED (erectile dysfunction)   . Bradycardia     Use of beta blocker limited  . 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 Systolic CHF, chronic   . AICD (automatic cardioverter/defibrillator) present 6/09    [employer]    Current Outpatient Prescriptions  Medication  Sig Dispense Refill  . aspirin 81 MG tablet Take 81 mg by mouth daily.        . candesartan (ATACAND) 4 MG tablet Take 4 mg by mouth daily.        . cetirizine (ZYRTEC) 10 MG tablet Take 10 mg by mouth daily.        . famotidine (PEPCID) 20 MG tablet Take 1 tablet (20 mg total) by mouth 2 (two) times daily.  60 tablet  2  . Fish Oil OIL Take 1,000 mg by mouth 3 (three) times daily.       . metoprolol (TOPROL-XL) 25 MG 24 hr tablet 1/2 tab two times a day.       . niacin (NIASPAN) 1000 MG CR tablet Take 1,000 mg by mouth at bedtime.        . nitroGLYCERIN (NITROSTAT) 0.4 MG SL tablet Place 1 tablet (0.4 mg total) under the tongue every 5 (five) minutes as needed for chest pain.  25 tablet  11  . simvastatin (ZOCOR) 40 MG tablet Take 40 mg by mouth at bedtime.          Allergies  Allergen Reactions  . Atorvastatin     REACTION: myalgia  . Sulfonamide Derivatives    Vital Signs: BP 128/76  Pulse 56  Ht 5' 9.5" (1.765 m)  Wt 186 lb (84.369 kg)  BMI 27.07 kg/m2  PHYSICAL EXAM: Well nourished, well developed, in no acute distress HEENT: normal Neck: no JVD Cardiac:  normal S1, S2; RRR; no murmur Lungs:  clear to auscultation bilaterally, no wheezing, rhonchi or rales Abd: soft, nontender, no hepatomegaly Ext: no edema Skin: warm and dry Neuro:  CNs 2-12 intact, no focal abnormalities noted Psych: Normal affect  GXT Myoview 04/21/11:  Rest Procedure: Myocardial perfusion imaging was performed at rest 45 minutes following the intravenous administration of Technetium 51m Tetrofosmin.  Rest ECG: Sinus Bradycardia with PVCS and a RBBB  Stress Procedure: The patient exercised for 11:30. The patient stopped due to fatigue and denied any chest pain. There were no significant ST-T wave changes and occ pvcs/pacs injected at peak exercise and myocardial perfusion imaging was performed after a brief delay.  Stress ECG: No significant change from baseline ECG   QPS  Raw Data Images: Normal;  no motion artifact; normal heart/lung ratio.  Stress Images: There is decreased uptake in the lateral wall.  Rest Images: There is decreased uptake in the lateral wall.  Subtraction (SDS): There is a fixed defect that is most consistent with a previous infarction.  Transient Ischemic Dilatation (Normal <1.22): 0.95  Lung/Heart Ratio (Normal <0.45): 0.40   Quantitative Gated Spect Images  QGS EDV: 208 ml  QGS ESV: 133 ml  QGS cine images: Inferolateral and septal hypokinesis  QGS EF: 36%   Impression  Exercise Capacity: Good exercise capacity.  BP Response: Normal blood pressure response.  Clinical Symptoms: Mild chest pain/dyspnea.  ECG Impression: No significant ST segment change suggestive of ischemia.  Comparison with Prior Nuclear Study: No images to compare   Overall Impression: L[place] inferolateral wall infarct from apex to base with no ischemia   ASSESSMENT AND PLAN:

## 2011-04-30 NOTE — Assessment & Plan Note (Addendum)
Myoview without ischemia.  EF is unchanged.  Symptoms more consistent with stress.  He is not interested in taking medications.  He feels like his stress may improve after retiring.  Recent CXR ok.  No further testing at this time.  Recommend he follow up in 6 weeks with Dr. Gala Romney.  If he has a change in symptoms before then, he should return sooner.  Dr. Gala Romney also saw the patient and discussed proceeding to cath.  The patient prefers to hold off and follow up in 4-6 weeks.

## 2011-05-02 NOTE — Op Note (Signed)
Matthew Joyce, Matthew Joyce               ACCOUNT NO.:  [id number]   MEDICAL RECORD NO.:  [id number]          PATIENT TYPE:  INP   LOCATION:  2313                         FACILITY:  [facility]   PHYSICIAN:  Salvatore Decent. Cornelius Moras, M.D. DATE OF BIRTH:  February 13, 1951   DATE OF PROCEDURE:  02/12/2005  DATE OF DISCHARGE:                                 OPERATIVE REPORT   PREOPERATIVE DIAGNOSIS:  Severe three-vessel coronary artery disease.   POSTOPERATIVE DIAGNOSIS:  Severe three-vessel coronary artery disease.   PROCEDURE:  Median sternotomy for coronary artery bypass grafting x4 (left  internal mammary artery to distal left anterior descending coronary artery,  free right internal mammary artery to ramus intermediate branch, saphenous  vein graft to second circumflex marginal branch, saphenous vein graft to  right posterolateral branch, endoscopic saphenous vein harvest from right  thigh).   SURGEON:  Salvatore Decent. Cornelius Moras, M.D.   ASSISTANT:  Alleen Borne, M.D.  Stephanie Acre. Dominick, P.A.   ANESTHESIA:  General.   BRIEF CLINICAL NOTE:  The patient is a 60 year old white male from  [place] with no previous history of coronary artery disease.  He  presents with symptoms of angina.  Cardiac catheterization performed by Dr.  Loraine Leriche Pulsipher demonstrates severe three-vessel coronary artery disease with  mild left ventricular dysfunction.  A full consultation note has been  dictated previously.   OPERATIVE CONSENT:  The patient has been counseled at length regarding the  indications and potential benefits of coronary artery bypass grafting.  Alternative treatment strategies have been discussed. He understands and  accepts all associated risks of surgery and desires to proceed as described.   OPERATIVE NOTE IN DETAIL:  The patient is brought to the operating room and  above-mentioned date and central monitoring is established by the anesthesia  service under the care and direction of Dr. Sharee Holster.  Specifically, a  Swan-Ganz catheter is placed through the right internal jugular approach.  A  radial arterial line is placed.  Intravenous antibiotics are administered.  Following induction with general endotracheal anesthesia, a Foley catheter  is placed.  The patient's chest, abdomen, both groins, and both lower  extremities are prepared and draped in a sterile manner.   Baseline transesophageal echocardiogram is performed by Dr. Jacklynn Bue. This  demonstrates mild left ventricular dysfunction with global hypokinesis, in  particular hypokinesis of the distal anterior wall and apex.  No other  significant abnormalities are noted.   A median sternotomy incision is performed and the left internal mammary  artery is dissected from the chest wall and prepared for bypass grafting.  The left internal mammary artery is good-quality conduit.  Following this, the right internal mammary artery is also dissected from the  chest wall and prepared for bypass grafting.  This vessel is good-quality  conduit.  Simultaneously, saphenous vein is obtained from the patient's  right thigh using endoscopic vein harvest technique through a small incision  made just above the right knee.  The saphenous vein is good-quality conduit.  After the saphenous vein is removed, the small surgical incisions in the  right thigh are  closed in multiple layers with running absorbable suture.   The patient is heparinized systemically.  The pericardium is opened.  The  ascending aorta is normal in appearance.  The ascending aorta and the right  atrium are cannulated for cardiopulmonary bypass.  A retrograde cardioplegia  catheter is placed through the right atrium into the coronary sinus.   Cardiopulmonary bypass is begun and the surface of the heart is inspected.  Distal sites are selected for coronary bypass grafting.  Portions of  saphenous vein and both internal mammary arteries are trimmed to appropriate   length.  The right internal mammary artery notably is not long enough to  reach any of the distal targets for grafting as an in-situ graft.  Therefore, the right internal mammary artery is transected just after its  takeoff from the right subclavian artery and the proximal stump is oversewn  with suture ligatures.  The right internal mammary artery is subsequently  used as a free graft during the surgery as described.   A cardioplegia catheter is placed in the ascending aorta.  The patient is  allowed to cool passively to 30 degrees systemic temperature.  The aortic  crossclamp is applied.  Cardioplegia delivered initially in an antegrade  fashion through the aortic root.  Supplemental cardioplegia is administered  retrograde through the coronary sinus catheter.  The initial cardioplegic  arrest and myocardial cooling are felt to be excellent.  Iced saline slush  is applied for topical hypothermia.  The repeat doses of cardioplegia are  administered intermittently throughout the crossclamp portion of the  operation antegrade through the subsequently-placed vein grafts and  retrograde through the coronary sinus catheter to maintain septal  temperature to below 15 degrees centigrade.   Following distal coronary anastomoses are performed:  1.  The posterolateral branch off the distal right coronary artery is      grafted with a saphenous vein graft in an end-to-side fashion.  This      vessel measures 1.7 mm in diameter and is of fair quality.  2.  The second circumflex marginal branch is grafted with a saphenous vein      graft in an end-to-side fashion.  This coronary measures 1.4 mm in      diameter and is of good quality.  3.  The ramus intermediate branch is grafted with the right internal mammary      artery as a free graft in an end-to-side fashion.  This vessel measures      2 mm in diameter and is of good quality. 4.  The distal left anterior descending coronary artery is grafted  with the      left internal mammary artery in an end-to-side fashion.  This coronary      measures 2 mm in diameter and is of good quality.   Both proximal saphenous vein anastomoses are performed directly to the  ascending aorta prior to removal of the aortic crossclamp.  The left  ventricular septal temperature rises rapidly with reperfusion of left  internal mammary artery.  One final dose of warm retrograde hot-shot  cardioplegia is administered.  The aortic crossclamp is removed after a  total crossclamp time of 72 minutes.   The single proximal anastomosis of the right internal mammary artery is  performed and a side-to-end fashion onto the hood of the vein graft to the  circumflex marginal branch just after takeoff of the vein graft from the  ascending aorta.  All proximal and distal coronary anastomoses  are inspected  for hemostasis and appropriate graft orientation.  The patient is rewarmed  to 37 degrees centigrade temperature.  The patient is weaned from  cardiopulmonary bypass without difficulty.  The patient's rhythm at  separation from bypass is normal sinus rhythm.  No inotropic support is  required.  Total cardiopulmonary bypass time for the operation is 100  minutes.   The venous and arterial cannulae are removed uneventfully.  Protamine is  administered to reverse the anticoagulation.  The mediastinum and both  pleural spaces are irrigated with saline solution containing vancomycin.  Meticulous surgical hemostasis is ascertained.  The mediastinum and both  left and right pleural spaces are drained with four chest tubes exited  through separate stab incisions inferiorly.  The median sternotomy is closed  with double-strength sternal wires.  Soft tissues anterior to the sternum  closed in multiple layers.  The skin was closed with a running subcuticular  skin closure.   The patient tolerated the procedure well and is transported to surgical  intensive care unit in  stable condition.  There are no intraoperative  complications.  All sponge, instrument and needle counts are verified  correct at completion of the operation.  No blood products were  administered.      CHO/MEDQ  D:  02/12/2005  T:  02/12/2005  Job:  [id number]   cc:   Carole Binning, M.D. [facility]   Wanda Plump, MD [facility]  [phone] [address] [place], [place] [address]

## 2011-05-02 NOTE — Letter (Signed)
July 14, 2006      RE:  LADERRICK, WILK  MRN:  [id number]  /  DOB:  February 10, 1951   TO WHOM IT MAY CONCERN:   I am writing this letter regarding Mr. Demorris Choyce who is a 60 year old  male whom I follow in cardiology clinic for his ischemic cardiomyopathy.  We  have obtained a recent echo which showed an ejection fraction of 30 to 40%  with a mean of 35% thus based on data, he is on the borderline of qualifying  for a defibrillator.  Mr. Carlton Adam was slightly reluctant about this at  first, quite understandably.  To further risk stratify him, we have  performed a T wave alternans test which was negative and suggested that he  may be at low risk for sudden cardiac death, however, given his significant  burden of coronary arteries, I was worried about the possibility of  myocardial scarring and the possibility of a nidus for ventricular  arrhythmias, thus we performed cardiac MRI to clearly evaluate his ejection  fraction and also evaluate for scar burden.  Result of the cardiac MRI  showed ejection fraction of 44% with 50% scar involving the inferior and  lateral walls from the base to apex.  Based on his ejection fraction of 44%,  he does not meet current criteria for an ICD.   Should you have any question regarding the medical necessity of any cardiac  testing performed, please do not hesitate to contact me.    Sincerely,     [name], MD   DRB/MedQ  DD:  07/14/2006  DT:  07/14/2006  Job #:  [id number]

## 2011-05-02 NOTE — Assessment & Plan Note (Signed)
[facility]                            CARDIOLOGY OFFICE NOTE   Joyce, Matthew Joyce                      MRN:          [id number]  DATE:12/09/2006                            DOB:          [birth year]    PRIMARY CARE PHYSICIAN:  Matthew Joyce, M.D.   PATIENT IDENTIFICATION:  Matthew Joyce is a delightful 60 year old male,  who returns for routine followup.   PROBLEM LIST:  1. Coronary artery disease.      a.     Status post coronary artery bypass grafting in March 2006 by       Dr. Cornelius Joyce.  2. Ischemic cardiomyopathy.      a.     Echocardiogram, November 2006, showed ejection fraction of       30-40% with mild to moderate mitral regurgitation and mild aortic       regurgitation.      b.     Cardiac MRI, May of 2007, EF of 44% with 50% scar involving       the inferolateral walls.  No comment on mitral regurgitation.      c.     Negative T-wave alternans testing in May of 2007.  3. Mixed hyperlipidemia.      a.     Most recent cholesterol panel, April 2007:  Total       cholesterol 110, triglycerides 49, HDL 44 and LDL 57.  4. Glucose intolerance.  5. Chronic right bundle branch block.   CURRENT MEDICATIONS:  1. Omega 3 fish oil.  2. Niaspan 1000.  3. Toprol XL 25.  4. Simvastatin 40.  5. Aspirin 81.   MEDICATION INTOLERANCES:  ALTACE discontinued due to hypotension.  BETA  BLOCK dose decreased due to significant bradycardia.   INTERVAL HISTORY:  Matthew Joyce returns today for routine followup.  He  continues to walk three miles a day at a very brisk pace without any  chest pain or shortness of breath.  He is not having any heart failure  symptoms.  He otherwise is doing quite well.  He does take his blood  pressure regularly and runs in the low 100s, high 90 range.   PHYSICAL EXAMINATION:  He is well-appearing, in no acute distress,  ambulates in the clinic without any difficulty.  Blood pressure is  98/70.  Heart rate is 61.  Weight is 172.  HEENT:  Sclerae anicteric.  EOMI.  There are no xanthelasmas.  Mucous  membranes are moist.  NECK:  The neck is supple, there is no JVD.  Carotids are 2+ bilaterally  without any bruits.  There is no lymphadenopathy or thyromegaly.  CARDIAC:  Regular rate and rhythm, no murmurs, rubs or gallops.  LUNGS:  Clear.  ABDOMEN:  Soft, nontender, nondistended.  No obvious hepatosplenomegaly,  no bruits, no masses appreciated.  EXTREMITIES:  Warm with no cyanosis, clubbing or edema.  NEUROLOGIC:  He is alert and oriented times three.  Cranial nerves are  grossly intact.  Moves all four extremities without difficulty.  Affect  is bright.   His EKG shows normal sinus rhythm at a  rate of 61 with a right bundle  branch block and a previous inferior infarct.   ASSESSMENT AND PLAN:  1. Coronary artery disease, status post CABG.  He is doing quite well,      without any signs of ischemia.  Continue current therapy.  2. Ischemic cardiomyopathy, ejection fraction is just mildly      depressed.  He has no significant heart failure symptoms.      Unfortunately, he is unable to tolerate an ACE inhibitor, due to      hypotension.  3. Hyperlipidemia.  LDL is at goal.  He will follow up with Dr. Drue Joyce.   DISPOSITION:  We will see him back in nine months.     Matthew Buckles. Bensimhon, MD  Electronically Signed    DRB/MedQ  DD: 12/09/2006  DT: 12/09/2006  Job #: [id number]   cc:   Matthew Ora, MD

## 2011-05-02 NOTE — H&P (Signed)
NAME:  Matthew Joyce, Matthew Joyce NO.:  [id number]   MEDICAL RECORD NO.:  [id number]          PATIENT TYPE:  INP   LOCATION:                               FACILITY:  [facility]   PHYSICIAN:  Carole Binning, M.D. LHCDATE OF BIRTH:  1951/10/15   DATE OF ADMISSION:  02/10/2005  DATE OF DISCHARGE:                                HISTORY & PHYSICAL   CHIEF COMPLAINT:  Chest pain.   HISTORY OF PRESENT ILLNESS:  Matthew Joyce is a pleasant 60 year old male  without known prior cardiac history.  He has a six to eight-month history of  exertional chest pain.  His pain is described as a sharp pain occurring in  the left scapular region which radiates into the anterior chest wall as well  as in the left shoulder and arm.  It occurs with exertion, particularly  after eating meals.  In addition after eating a heavy meal, he has had  occasional nocturnal episodes.  His pain has been recurrent and somewhat  progressive in nature.  It lasts anywhere from a few minutes up to 15 to 20  minutes.  He denies any pain occurring strictly at rest or any pain lasting  longer than 20 minutes.  He was actually seen in our office by Doylene Canning.  Ladona Ridgel, M.D. last week for a question of Brugada syndrome on a previous EKG.  He repeated an EKG here which did not suggest Brugada syndrome, but did show  possible old septal infarct with some nonspecific anterior T wave changes.  He was scheduled for a stress Myoview study which was performed this  morning.  The patient exercised for a total of 9 minutes and 35 seconds  according to Bruce protocol achieving 11 minutes. Peak heart rate was 146.  The patient did experience chest pain with exercise.  This persisted for  several months into recovery.  The pain began in stage II and at its worse  increased to 8/10 in severity.  When I initially saw the patient it was  2/10.  He was given one sublingual nitroglycerin and he is now pain-free.  The exercise EKG was notable  for development of inferolateral ST segment  depression with exercise.  He also had PVC's and occasional ventricular  couplets.  His perfusion image was remarkably abnormal with a large  anteroapical defect with partial reversibility.  Ejection fraction was  calculated at 33%.   The patient denies any symptoms of exertional dyspnea, orthopnea, PND, or  edema.  He has not had any syncope or presyncope.  His cardiac risk factors  are positive for mild dyslipidemia.  He also has a brother who died at an  early age of question of a cardiomyopathy.  He has a remote history of  tobacco use.  He denies history of hypertension or diabetes.   PAST MEDICAL HISTORY:  1.  Gastroesophageal reflux disease with history of esophageal stricture,      status post dilatation.  2.  Mild dyslipidemia which has been controlled with diet.   CURRENT MEDICATIONS:  Aspirin 325 mg daily.   ALLERGIES:  No known drug allergies.   SOCIAL HISTORY:  The patient is married with two children.  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   HABITS:  Tobacco; previously smoked 1/2 to 1 pack per day for approximately  seven years, quit five years ago.  Alcohol; none.   FAMILY HISTORY:  Pertinent for a brother who died with cardiomyopathy at age  65.  The patient does not know the etiology of his brother's cardiomyopathy.  There is no other family history of premature coronary artery disease.   REVIEW OF SYSTEMS:  Positive for a history of gout.  He also has a history  of gastroesophageal reflux disease as above with apparent esophageal  stricture.  Otherwise review of systems is negative except as documented  above.   PHYSICAL EXAMINATION:  GENERAL:  This is a well-appearing male in no acute  distress.  VITAL SIGNS:  Weight 182 pounds, pulse 60 and regular, blood pressure  121/76.  SKIN:  Warm and dry without generalized rash.  HEENT:  Normocephalic and atraumatic.  Sclerae anicteric.  Oral  mucosa  unremarkable.  NECK:  No adenopathy or thyromegaly.  No JVD.  Carotid upstroke normal  without bruit.  CHEST:  Clear to auscultation and percussion.  HEART:  PMI not displaced.  Regular rate and rhythm.  Soft S4.  Normal S1  and S2, no rub, murmur, or S3.  ABDOMEN:  Soft and nontender without organomegaly.  Normal bowel sounds with  no bruits.  EXTREMITIES:  No cyanosis, clubbing, or edema.  Peripheral pulses are 2+  throughout.  There are no femoral bruits.   EKG today shows sinus rhythm at a rate of 60 with possible prior  anteroseptal infarct.  There are nonspecific ST and T wave changes with very  mild ST depression in V5 and V6 and J-point elevation in V1 and V2 which has  been present on previous EKG's.   ASSESSMENT:  The patient is a 60 year old male with six to eight-month  history of exertional chest pain which has been progressive in nature.  He  now presents following a stress nuclear study which is notable for large  partially reversible anterior perfusion defect.  He did experience prolonged  substernal chest pain which has been relieved with nitroglycerin.   PLAN:  The patient will be admitted to a telemetry bed.  He will be started  on intravenous heparin and nitroglycerin.  He will be treated with aspirin  and beta blocker. Cardiac enzymes will be cycle to rule out myocardial  infarction.  We anticipate proceeding with cardiac catheterization within  the next 24 hours.  This will be done more urgently should he have recurrent  refractory chest pain.      MWP/MEDQ  D:  02/10/2005  T:  02/10/2005  Job:  [id number]   cc:   Wanda Plump, MD [facility]  [phone] [address] [place], [place] [address]

## 2011-05-02 NOTE — Op Note (Signed)
Matthew Joyce, BADIE NO.:  [id number]   MEDICAL RECORD NO.:  [id number]          PATIENT TYPE:  INP   LOCATION:  2313                         FACILITY:  [facility]   PHYSICIAN:  Burna Forts, M.D.DATE OF BIRTH:  [birth year]   DATE OF PROCEDURE:  02/12/2005  DATE OF DISCHARGE:                                 OPERATIVE REPORT   PROCEDURE:  Anesthesia transesophageal echocardiographic report.   OPERATOR:  Burna Forts, M.D.   INDICATION FOR PROCEDURE:  Mr. Soderman is a 60 year old gentleman who  presents today for coronary artery bypass grafting.  It has been requested  by his attending surgeon, Dr. Cornelius Moras, that we place TE probe for evaluation of  cardiac structures and function.   After satisfactory tracheal intubation, the TE probe was lubricated and  passed oropharyngeally into the stomach and withdrawn for imaging of the  cardiac structures.   PRE-CARDIOPULMONARY BYPASS EXAMINATION:  Left ventricle:  This is a normal  left ventricle in its wall thickness in a short axis view.  There is overall  an impression of slightly decreased left ventricular function in this short  axis view but the papillary muscles are well outlined.  The chamber in long  axis view reveals it to be a slightly mildly dilated chamber in its total  width with an accentuated dilatation near or at the apex.  Several views in  the long axis demonstrated that the anterior and anteroseptal walls are  hypokinetic to nearly akinetic in a portion of the septal wall area.  The  inferior, lateral, and posterior wall appear to be thickening satisfactorily  with some mild diminishment;  however of primary importance is that  anteroseptal wall and anterior area of near akinesis.  There also appears to  be in the long axis view a significant apical hypokinesis.  No other masses  are noted within.   Mitral valve:  This is a thin, normal, compliant, mobile mitral valve  apparatus.  Coaptation  appears to be satisfactory just below the level of  the annular area.  Doppler examination reveals only trace mitral regurgitant  flow.   Left atrium:  This is a normal left atrial chamber in size.  There are no  masses noted within.  The apical appendage is visualized. No masses again  are noted within.  The interatrial septum is interrogated, and again appears  intact.   Aortic valve:  Normal, three-cusp aortic valve, normal in function and  structure.   Right ventricle:  This is a somewhat increased right ventricular chamber in  its overall dimensions.  It does appear to be contractile.   Tricuspid valve:  This is a thin, compliant, mobile tricuspid valve  apparatus.  Again, trace regurgitant flow is noted.   The patient was placed on cardiopulmonary bypass.  Coronary artery bypass  grafting is carried out by Dr. Cornelius Moras.  The patient was rewarmed, separated  from cardiopulmonary bypass with the initial attempt.   POST-CARDIOPULMONARY TRANSESOPHAGEAL EXAMINATION:  Left ventricle:  The left  ventricular chamber now in both short and long axis views appears to be  improved in its contractile pattern or at least more vigorous in the short  axis view.  Again, in the long axis views and midesophageal views, the  septal area still remains akinetic with the hypokinesis again noted in the  anteroseptal area.  Overall, however, the overall left ventricular chamber  function appears improved compared to the pre-bypass period.   Mitral valve:  There is perhaps a slightly more amount of what again remains  only trace mitral regurgitant flow.  This is noted. The rest of the cardiac  examination was as previously described with no significant changes.   The patient was returned to the cardiac intensive care unit in stable  condition.      JTM/MEDQ  D:  02/12/2005  T:  02/12/2005  Job:  [id number]

## 2011-05-02 NOTE — Cardiovascular Report (Signed)
NAME:  Matthew Joyce, Matthew Joyce NO.:  [id number]   MEDICAL RECORD NO.:  [id number]          PATIENT TYPE:  INP   LOCATION:  6526                         FACILITY:  [facility]   PHYSICIAN:  Carole Binning, M.D. LHCDATE OF BIRTH:  Oct 01, 1951   DATE OF PROCEDURE:  02/11/2005  DATE OF DISCHARGE:                              CARDIAC CATHETERIZATION   PROCEDURE:  1.  Left heart catheterization with coronary angiography and left      ventriculography.  2.  Attempt to cross a chronic total occlusion of the proximal left anterior      descending coronary artery.  This was unsuccessful, due to the inability      to cross the occlusion with a coronary guide wire.   CARDIOLOGIST:  Carole Binning, M.D.   INDICATIONS FOR PROCEDURE:  Matthew Joyce is a 60 year old male who has had  progressive exertional chest pain. A  stress nuclear study performed in the  office yesterday was notable for a large partially-reversible anterior  apical perfusion defect, consistent with anterior infarction with  superimposed ischemia.  He had prolonged chest pain after the stress test,  and was admitted to the hospital and then brought to the cardiac  catheterization laboratory today.   DESCRIPTION OF PROCEDURE:  A 6-French sheath was placed in the right femoral  artery.  A coronary angiography was performed with the standard Judkins 6-  French catheters. A left ventriculography was performed with an angled  pigtail catheter.  Contrast was Omnipaque.  After completion of the  diagnostic portion of the catheterization, we opted to attempt to cross the  chronic total occlusion of the proximal left anterior descending artery.  This was after an extensive discussion with the patient, who did prefer an  attempt at a percutaneous coronary intervention.  We used a 6-French JL4  guiding catheter.  Heparin was administered to maintain an ACT of greater  than 200 seconds.  We first attempted to cross with an  Ashahi soft coronary  guide wire; however, this would not cross the occlusion.  We then attempted  with a PT2 Light Support wire.  This was also unsuccessful.  Finally we used  a [employer] 4.5 coronary guide wire, but again were unable to cross  the occlusion in the proximal LAD, mainly due to the length of the  occlusion, as well as due to the inability to provide backup to the wire, as  this occlusion was just beyond the ostium of the left anterior descending  artery.  Final angiographic images were obtained, showing no evidence of  vessel trauma, and no change in the appearance of the chronic total  occlusion in the proximal left anterior descending artery.  At the  conclusion of the procedure, a 6-French Angio-Seal vascular closure device  was placed in the right femoral artery with good hemostasis.   COMPLICATIONS:  None.   RESULTS:  HEMODYNAMICS  Left ventricular pressure:  100/5.  Aortic pressure:  116/62.  There was no aortic valve gradient.   LEFT VENTRICULOGRAM:  There was severe hypokinesis of the anterior apical  wall.  The ejection fraction was calculated at 43%.  There was no mitral  regurgitation.   CORONARY ARTERIOGRAPHY:  1.  LEFT MAIN CORONARY ARTERY:  The left main coronary artery is short with      mild diffuse plaquing.  2.  LEFT ANTERIOR DESCENDING CORONARY ARTERY:  The left anterior descending      coronary artery is 100% occluded proximally just beyond its origin.  The      mid and distal LAD fill via grade 3 left to left collaterals.  The      apical LAD is a fairly lengthy vessel curling in the apex, and supplying      the apical portion of the inferior wall.  There is a 60% stenosis in the      apical left anterior descending artery.  3.  LEFT CIRCUMFLEX CORONARY ARTERY:  The left circumflex coronary artery      has a 70% stenosis in the mid-vessel proximal to a large second obtuse      marginal branch.  The circumflex gives rise to a large ramus  intermedius      which has a 40% stenosis proximally, followed by a 60% stenosis in the      mid-body.  There is a small first obtuse marginal and a large second      obtuse marginal branch.  The second obtuse marginal branch has a 50%      stenosis at its origin.  4.  RIGHT CORONARY ARTERY:  The right coronary artery is a dominant vessel.      There is severe tortuosity of the proximal vessel.  In the proximal      right coronary artery there is a 50% stenosis.  In the mid-vessel there      is a diffuse 30% stenosis.  In the distal vessel there is a 60%,      followed by a diffuse 40% stenosis.  The distal right coronary artery      gives rise to a small posterior descending artery, a large first      posterolateral branch and a small second posterolateral branch.   IMPRESSION:  1.  Moderately-decreased left ventricular systolic function with severe      anterior apical hypokinesis.  2.  Three-vessel coronary artery disease characterized by a chronic total      occlusion of the proximal left anterior descending artery.  There is      also moderate disease in the left circumflex artery and the right      coronary artery as described.   PLAN:  At this point the patient will be referred for coronary artery bypass  graft surgery.      MWP/MEDQ  D:  02/11/2005  T:  02/11/2005  Job:  [id number]   cc:   Wanda Plump, MD [facility]  [phone] [address] [place], [place] [address]

## 2011-05-02 NOTE — Discharge Summary (Signed)
NAMEMARKEITH, JUE NO.:  [id number]   MEDICAL RECORD NO.:  [id number]          PATIENT TYPE:  INP   LOCATION:  2022                         FACILITY:  [facility]   PHYSICIAN:  Salvatore Decent. Cornelius Moras, M.D. DATE OF BIRTH:  1951/10/04   DATE OF ADMISSION:  02/10/2005  DATE OF DISCHARGE:                                 DISCHARGE SUMMARY   PRIMARY DISCHARGE DIAGNOSIS:  Coronary artery disease.   SECONDARY DIAGNOSES:  1.  Hyperlipidemia.  2.  Gastroesophageal reflux disease.  3.  Gout.   OPERATIONS/PROCEDURES/TREATMENT:  1.  Left heart catheterization of coronary angiography and left      ventriculography.  2.  Attempt to cross a chronic total occlusion of the proximal left anterior      descending coronary artery which was unsuccessful due to the inability      of crossing the occlusion with a coronary guide wire.  3.  Coronary artery bypass grafting x4 with the left internal mammary artery      to the distal left anterior descending coronary artery, a free right      internal mammary artery to the ramus intermediate branch, saphenous vein      graft to the second circumflex marginal branch, and saphenous vein graft      to the right posterolateral branch.  Noted endoscopic vein harvesting      was done.   ALLERGIES:  No known drug allergies.   HISTORY AND PHYSICAL/HOSPITAL COURSE:  Mr. Dufresne is a 60 year old white  male from [place], [place], with nor previous history of  coronary artery disease, but risk factors notable for history of  hyperlipidemia and a remote history of tobacco use.  The patient reports  that he was in his usual state of good health until 6-8 months ago when he  first began to develop symptoms of chest pain.  He described his pain as  sharp pain occurring across the left chest, radiating to the scapula as well  as sometimes the left shoulder and arm.  Initially the pain seemed to be  brought on after eating meals, although it also  occurred following physical  exertion.  He underwent a full GI workup including upper GI endoscopy and he  apparently underwent dilatation of reported esophageal stricture despite no  previous serious difficulties swallowing.  His pain persisted.  He tried  numerous antacid therapies without improvement.  Ultimately he was referred  for cardiac evaluation.  Yesterday he underwent a stress Myoview exam at  [facility] office.  He developed chest pain during stage II of the  protocol, which was associated with severe ischemia involving the anterior  and inferior septal distribution on perfusion scan.  His chest pain was  prolonged and persisted for more than 20 minutes after his stress test,  although it ultimately subsided.  The patient was promptly admitted to the  hospital by Dr. Gerri Spore.  He subsequently ruled out for acute myocardial  infarction.  Mr. Noah was taken for cardiac catheterization on February 11, 2005, with findings at catheterization including severe three-vessel  coronary artery disease  with chronic occlusion of the left anterior  descending coronary artery and moderate left ventricular dysfunction.  CVTS  was then consulted.  For details of the patient's past medical history and  physical exam, please see dictated history and physical.   HOSPITAL COURSE:  On February 12, 2005, Mr. Buntyn underwent coronary artery  bypass grafting x4 with a left internal mammary artery to distal left  anterior descending artery, a free internal mammary artery to the ramus  intermediate range, a saphenous vein graft to the second circumflex marginal  branch, and a saphenous vein graft to the right posterolateral branch.  Noted that right intravascular vein harvesting was done.  The patient  tolerated this procedure well and she was transferred up to the intensive  care unit in stable condition.  He was extubated that evening of surgery.  The patient's postoperative course was  pretty much unremarkable.  His chest  tubes were discontinued on postoperative day #1.  The patient did develop  some postoperative anemia, but did not require any transfusions, just  monitored.  The patient also had some volume overload and he was started on  Lasix on postoperative day #3.  The patient was  out of bed  ambulating  well, positive postoperative bowel movement, and able to ambulate on room  air with O2 saturations about 94%.  His incisions were dry and intact.  He  was discharged to home on postoperative day #3 in stable condition.  The  patient remained off the oxygen at discharge.  A follow-up appointment with  Dr. Cornelius Moras was scheduled for March 10, 2005 at 1:30 p.m.  A follow-up  appointment will also be scheduled with Dr. Gerri Spore in two weeks.  The  patient will obtain a PA and lateral chest x-ray at this appointment and he  will bring that chest x-ray with him to his appointment with Dr. Cornelius Moras.  Mr.  Lincks received instructions on diet, activity level, and incisional care.  He is totally restricted to drive until released to do so and no heavy  lifting over 10 pounds.  The patient was also informed that he is to shower  using soap and water to wash his incisions.  He is to contact the office if  he develops any drainage or opening from any of his incision sites.  The  patient acknowledges understanding.   DISCHARGE MEDICATIONS:  1.  Metoprolol 25 mg p.o. b.i.d.  2.  Aspirin 325 mg p.o. daily.  3.  Lipitor 40 mg p.o. q.h.s.  4.  Lasix 40 mg p.o. b.i.d. x14 days.  5.  Potassium chloride 20 mEq p.o. b.i.d. x14 days.  6.  Niferex 150 mg p.o. daily.  7.  Tylox one tablet p.o. q.4h. p.r.n. pain.      KMD/MEDQ  D:  02/14/2005  T:  02/15/2005  Job:  [id number]

## 2011-05-02 NOTE — Consult Note (Signed)
NAMEAVEION, NGUYEN               ACCOUNT NO.:  [id number]   MEDICAL RECORD NO.:  [id number]          PATIENT TYPE:  INP   LOCATION:  6526                         FACILITY:  [facility]   PHYSICIAN:  Salvatore Decent. Cornelius Moras, M.D. DATE OF BIRTH:  October 12, 1951   DATE OF CONSULTATION:  02/11/2005  DATE OF DISCHARGE:                                   CONSULTATION   REQUESTING PHYSICIAN:  Dr. Loraine Leriche Pulsipher.   PRIMARY CARE PHYSICIAN:  Dr. Silvano Bilis.   REASON FOR CONSULTATION:  Severe three-vessel coronary artery disease.   HISTORY OF PRESENT ILLNESS:  Mr. Kardell is a 60 year old white male from  [place], [place], with no previous history of coronary artery  disease but risk factors notable for history of hyperlipidemia and remote  history of tobacco use. The patient reports that he was in his usual state  of good health until six or eight months ago when he first began to develop  symptoms of chest pain. He describes his pain as sharp pain, occurring  across left chest radiating to the scapula as well as some time to the left  shoulder and arm. Initially the pain seemed to be brought on after eating  meals although it also occurs following physical exertion. He underwent a  full GI workup including upper GI endoscopy and he apparently underwent  dilatation of a reported lower esophageal stricture despite no previous  history of difficulty swallowing. His pain persisted. He tried numerous  antacid therapies without improvement. Ultimately, he was referred for  cardiac evaluation and yesterday he underwent a stress Myoview exam at the  [facility] office. He developed chest pain during stage II of the  protocol which was associated with severe ischemia involving the anterior  and anteroseptal distribution on perfusion scan. His chest pain was  prolonged and persisted for more than 20 minutes after the stress test,  although it ultimately subsided. The patient was promptly admitted to  the  hospital by Dr. Gerri Spore. He subsequently ruled out for acute myocardial  infarction. Mr. Clinard was taken for cardiac catheterization today with  findings at catheterization including severe three-vessel coronary artery  disease with chronic occlusion of the left anterior descending coronary  artery and moderate left ventricular dysfunction. Cardiac surgical  consultation has been requested to consider surgical revascularization.   REVIEW OF SYSTEMS:  GENERAL: The patient reports otherwise feeling well. He  has good appetite, has not been gaining or losing weight. CARDIAC: Notable  for symptoms of classical angina as described. The patient has had a fairly  frequent episodes of chest pain over the last few months, most commonly  associated with physical activity or early following meals. The patient  reports that the pain usually subsides within 20-30 minutes. The patient  denies any symptoms of shortness of breath although he did develop some  shortness of breath yesterday during his stress test. He denies PND,  orthopnea, lower extremity edema, palpitations, syncope. RESPIRATORY:  Negative. The patient denies recent productive cough, hemoptysis, wheezing.  GASTROINTESTINAL: Negative. The patient has no difficulty swallowing. The  patient reports normal bowel function.  He denies history of hematochezia,  hematemesis, melena. MUSCULOSKELETAL:  Essentially negative. The patient did  have some acute flare-up of gout involving his right great toe a couple of  months ago. This resolved with medical treatment. He has mild arthritis  afflicting both ankles. NEUROLOGIC:  Negative. The patient denies symptoms  suggestive of recent TIA or stroke. GENITOURINARY: Negative. INFECTIOUS:  Negative. HEENT: Negative. PSYCHIATRIC: Negative.   PAST MEDICAL HISTORY:  1.  Hyperlipidemia.  2.  GE Reflux disease.  3.  Gout.   PAST SURGICAL HISTORY:  1.  ORIF of left ankle fracture 1987.  2.   Tonsillectomy in the remote past.   FAMILY HISTORY:  The patient's father suffered a myocardial infarction in  his 69s. The patient's brother died with cardiomyopathy at a young age.   SOCIAL HISTORY:  The patient is married with two children who are grown. 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 He has a remote history of tobacco use, smoking one half to one  pack of cigarettes per day for several years. He quit smoking completely  five years ago. The patient denies history of significant alcohol  consumption.   MEDICATION PRIOR TO ADMISSION:  Aspirin 3-25 mg daily.   DRUG ALLERGIES:  None known.   PHYSICAL EXAM:  GENERAL APPEARANCE:  The patient is a well-appearing, well-  developed and well-nourished white male who appears his stated age in no  acute distress.  HEENT: Exam is unrevealing.  NECK:  The neck is supple. There is no cervical nor supraclavicular  lymphadenopathy. There is no jugular venous distension. No carotid bruits  noted.  CHEST:  Auscultation of the chest includes clear and symmetrical breath  sounds bilaterally. No wheezes or rhonchi are noted.  CARDIOVASCULAR: Exam reveals regular rate and rhythm. No murmurs, rubs,  gallops are appreciated.  ABDOMEN:  Abdomen is soft, nontender. There are no palpable masses. Bowel  sounds are present.  EXTREMITIES: Warm and well-perfused. There is no lower extremity edema.  Distal pulses are easily palpable on both lower legs at the ankle.  RECTAL AND GU:  Exams both deferred.  NEUROLOGIC: Examination is grossly nonfocal and symmetrical throughout.   DIAGNOSTIC TEST:  Cardiac catheterization performed by Dr. Gerri Spore is  reviewed. This demonstrates severe three-vessel coronary artery disease with  moderate left ventricular dysfunction. Specifically, there is 100% proximal  occlusion of the left anterior descending coronary artery. The distal portion of this vessel fills via  collaterals on left coronary injection.  There is a large ramus intermediate branch with 60% proximal stenosis. There  is 70% proximal stenosis of the left circumflex coronary artery which gives  rise to a large second circumflex marginal branch. The right coronary artery  is dominant. There is 50% proximal stenosis and 60% distal stenosis of the  right coronary artery. It gives rise to a large posterolateral branch. Left  ventricular function is moderately reduced with severe hypokinesis involving  the anterior apical portion of the left ventricle. Ejection fraction is  estimated 43%.   IMPRESSION:  Severe three-vessel coronary artery disease with progressive  symptoms of exertional angina. I believe that Mr. Bachus would best be  treated by elective coronary artery bypass grafting.   PLAN:  I have outlined options at length with Mr. Vanhoesen this afternoon.  Alternative treatment strategies have been discussed in detail. He  understands and accepts all associated risks of surgery including but not  limited to  risks of  death, stroke, myocardial infarction, congestive heart failure, respiratory  failure, pneumonia, bleeding requiring blood transfusion, arrhythmia,  infection, and recurrent coronary artery disease. All of his questions have  been addressed. We tentatively plan to proceed with surgery tomorrow.      CHO/MEDQ  D:  02/11/2005  T:  02/11/2005  Job:  [id number]   cc:   Carole Binning, M.D. [facility]   Wanda Plump, MD [facility]  [phone] [address] [place], [place] [address]

## 2011-05-09 NOTE — Progress Notes (Signed)
No ischemia. Continue current regimen.   Daniel Bensimhon 6:11 PM

## 2011-05-14 NOTE — Progress Notes (Signed)
Pt aware at appt with [name] on 5/16 Matthew Joyce 10:24 AM

## 2011-06-16 ENCOUNTER — Encounter: Payer: Self-pay | Admitting: Internal Medicine

## 2011-06-16 ENCOUNTER — Ambulatory Visit (INDEPENDENT_AMBULATORY_CARE_PROVIDER_SITE_OTHER): Payer: BC Managed Care – PPO | Admitting: Internal Medicine

## 2011-06-16 VITALS — BP 124/64 | HR 60 | Ht 69.5 in | Wt 188.4 lb

## 2011-06-16 DIAGNOSIS — I509 Heart failure, unspecified: Secondary | ICD-10-CM

## 2011-06-16 DIAGNOSIS — I2510 Atherosclerotic heart disease of native coronary artery without angina pectoris: Secondary | ICD-10-CM

## 2011-06-16 NOTE — Progress Notes (Signed)
History of Present Illness: Primary Cardiologist:  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 male with a history of a coronary artery disease status post bypass surgery in 2006.  He also had mod LV dysfunction. EF 30-35% in March 2009. He is s/p ICD.  There is also history of ischemic mitral regurgitation.  Remainder of medical history is for diabetes, hyperlipidemia and bradycardia. Ace-I previously stopped due to hypotension.  B-blocker dosing limited by bradycardia.  He has seen [name] and myself recently for chest pain.  We had him do a GXT Myoview.  He walked for over 11:00 and had no chest pain.  He had an old scar on his images but no ischemia.  His EF is unchanged.  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 Feels much better. No further CP.  He denies DOE, orthopnea, PND, edema or syncope.  Riding Air-Dyne 50 minutes per day.    Past Medical History  Diagnosis Date  . CAD (coronary artery disease)     a- S/p cabg in march 2006 by Dr.Owen;  b. myoview 5/12: large IL scar from apex to base, no ischemia, EF 37%  . Ischemic cardiomyopathy     a-Echocardiogram, Nov 2006, showed ejection fraction of 30-40% with mild to moderated mitral  regurgitation and mild aortic regurgitation b- Cardiac MRI, May 2007, EF of 44% with 50% scar involving  the inferolateral walls. No comment of mitral regurgitation. c- last echo  11/10: EF 30-35%  mod MR d- Nega T-Wave alternans testing in May of 2007 e- no ACE due to low BP;  f. CPX 3/11: normal  . Gout   . Hyperlipidemia   . DM (diabetes mellitus)   . ED (erectile dysfunction)   . Bradycardia     Use of beta blocker limited  . 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 Systolic CHF, chronic   . AICD (automatic cardioverter/defibrillator) present 6/09    St. Jude    Current Outpatient Prescriptions  Medication Sig Dispense Refill  . aspirin 81 MG tablet Take 81 mg by  mouth daily.        . candesartan (ATACAND) 4 MG tablet Take 4 mg by mouth daily.        . cetirizine (ZYRTEC) 10 MG tablet Take 10 mg by mouth daily.        . Fish Oil OIL Take 1,000 mg by mouth 3 (three) times daily.       . metoprolol (TOPROL-XL) 25 MG 24 hr tablet 1/2 tab two times a day.       . niacin (NIASPAN) 1000 MG CR tablet Take 1,000 mg by mouth at bedtime.        . nitroGLYCERIN (NITROSTAT) 0.4 MG SL tablet Place 1 tablet (0.4 mg total) under the tongue every 5 (five) minutes as needed for chest pain.  25 tablet  11  . simvastatin (ZOCOR) 40 MG tablet Take 40 mg by mouth at bedtime.        Marland Kitchen DISCONTD: famotidine (PEPCID) 20 MG tablet Take 1 tablet (20 mg total) by mouth 2 (two) times daily.  60 tablet  2    Allergies  Allergen Reactions  . Atorvastatin     REACTION: myalgia  . Sulfonamide Derivatives    Vital Signs: BP 124/64  Pulse 60  Ht 5' 9.5" (1.765 m)  Wt 188 lb 6.4 oz (85.458 kg)  BMI 27.42  kg/m2  PHYSICAL EXAM: Well nourished, well developed, in no acute distress HEENT: normal Neck: no JVD Cardiac:  normal S1, S2; RRR; no murmur Lungs:  clear to auscultation bilaterally, no wheezing, rhonchi or rales Abd: soft, nontender, no hepatomegaly Ext: no edema Skin: warm and dry Neuro:  CNs 2-12 intact, no focal abnormalities noted Psych: Normal affect  GXT Myoview 04/21/11:  Rest Procedure: Myocardial perfusion imaging was performed at rest 45 minutes following the intravenous administration of Technetium 38m Tetrofosmin.  Rest ECG: Sinus Bradycardia with PVCS and a RBBB  Stress Procedure: The patient exercised for 11:30. The patient stopped due to fatigue and denied any chest pain. There were no significant ST-T wave changes and occ pvcs/pacs injected at peak exercise and myocardial perfusion imaging was performed after a brief delay.  Stress ECG: No significant change from baseline ECG   QPS  Raw Data Images: Normal; no motion artifact; normal heart/lung  ratio.  Stress Images: There is decreased uptake in the lateral wall.  Rest Images: There is decreased uptake in the lateral wall.  Subtraction (SDS): There is a fixed defect that is most consistent with a previous infarction.  Transient Ischemic Dilatation (Normal <1.22): 0.95  Lung/Heart Ratio (Normal <0.45): 0.40   Quantitative Gated Spect Images  QGS EDV: 208 ml  QGS ESV: 133 ml  QGS cine images: Inferolateral and septal hypokinesis  QGS EF: 36%   Impression  Exercise Capacity: Good exercise capacity.  BP Response: Normal blood pressure response.  Clinical Symptoms: Mild chest pain/dyspnea.  ECG Impression: No significant ST segment change suggestive of ischemia.  Comparison with Prior Nuclear Study: No images to compare   Overall Impression: Larege inferolateral wall infarct from apex to base with no ischemia   ASSESSMENT AND PLAN:

## 2011-06-16 NOTE — Patient Instructions (Signed)
Your physician wants you to follow-up in:  6 months. You will receive a reminder letter in the mail two months in advance. If you don't receive a letter, please call our office to schedule the follow-up appointment.   

## 2011-06-16 NOTE — Assessment & Plan Note (Signed)
Doing well. No evidence of overt HF. Volume status looks great. Unable to tolerate ACE-I due to severe hypotension in past.

## 2011-06-16 NOTE — Assessment & Plan Note (Signed)
Stable. No evidence of ischemia on Myoview. Continue current therapy.

## 2011-06-19 ENCOUNTER — Ambulatory Visit (INDEPENDENT_AMBULATORY_CARE_PROVIDER_SITE_OTHER): Payer: BC Managed Care – PPO | Admitting: *Deleted

## 2011-06-19 ENCOUNTER — Other Ambulatory Visit: Payer: Self-pay | Admitting: Internal Medicine

## 2011-06-19 DIAGNOSIS — I428 Other cardiomyopathies: Secondary | ICD-10-CM

## 2011-06-19 DIAGNOSIS — Z95810 Presence of automatic (implantable) cardiac defibrillator: Secondary | ICD-10-CM

## 2011-06-19 LAB — REMOTE ICD DEVICE
BMOD-0002RV: 8
BRDY-0002RV: 40 {beats}/min
BRDY-0004RV: 110 {beats}/min
CHARGE TIME: 10.8 s
DEV-0020ICD: NEGATIVE
RV LEAD IMPEDENCE ICD: 360 Ohm
TZAT-0001FASTVT: 1
TZAT-0012FASTVT: 200 ms
TZAT-0013SLOWVT: 4
TZAT-0018SLOWVT: NEGATIVE
TZAT-0019FASTVT: 7.5 V
TZAT-0019SLOWVT: 7.5 V
TZAT-0020FASTVT: 1 ms
TZAT-0020SLOWVT: 1 ms
TZON-0003SLOWVT: 330 ms
TZON-0004FASTVT: 16
TZON-0005FASTVT: 6
TZON-0005SLOWVT: 6
TZON-0010SLOWVT: 80 ms
TZST-0001FASTVT: 2
TZST-0001FASTVT: 4
TZST-0001FASTVT: 5
TZST-0001SLOWVT: 2
TZST-0001SLOWVT: 4
TZST-0001SLOWVT: 5
TZST-0003FASTVT: 25 J
TZST-0003FASTVT: 36 J
TZST-0003FASTVT: 36 J
TZST-0003SLOWVT: 25 J
VENTRICULAR PACING ICD: 1 pct

## 2011-06-25 ENCOUNTER — Encounter: Payer: Self-pay | Admitting: *Deleted

## 2011-06-27 NOTE — Progress Notes (Signed)
icd remote check  

## 2011-09-01 ENCOUNTER — Other Ambulatory Visit: Payer: Self-pay | Admitting: Internal Medicine

## 2011-09-01 NOTE — Telephone Encounter (Signed)
Pt wants refill all meds simastatin niaspan toprol, [facility]

## 2011-09-02 MED ORDER — SIMVASTATIN 40 MG PO TABS: 40.0000 mg | ORAL_TABLET | Freq: Every day | ORAL | Status: DC

## 2011-09-02 MED ORDER — CANDESARTAN CILEXETIL 4 MG PO TABS: 4.0000 mg | ORAL_TABLET | Freq: Every day | ORAL | Status: DC

## 2011-09-02 MED ORDER — METOPROLOL SUCCINATE ER 25 MG PO TB24: ORAL_TABLET | ORAL | Status: DC

## 2011-09-02 MED ORDER — NIACIN ER (ANTIHYPERLIPIDEMIC) 1000 MG PO TBCR: 1000.0000 mg | EXTENDED_RELEASE_TABLET | Freq: Every day | ORAL | Status: DC

## 2011-09-10 ENCOUNTER — Telehealth: Payer: Self-pay | Admitting: Internal Medicine

## 2011-09-10 NOTE — Telephone Encounter (Signed)
Dois Davenport calling wanting to know if MD will switch atacand 4mg  (cost$150) to losartan (WUJW$11)  Reference #[id number]  P(zero, zero) 4  Please return call to discuss further.

## 2011-09-10 NOTE — Telephone Encounter (Signed)
SANDRA AWARE WILL FORWARD MESSAGE TO DR Gala Romney FOR REVIEW./CY

## 2011-09-15 NOTE — Telephone Encounter (Signed)
Ok to switch to Losartan 25. If BP falls can cut to 12.5 daily.

## 2011-09-18 NOTE — Telephone Encounter (Signed)
Had spoken w/pt on Mon 10/1 and let him know what was going on, he was agreeable to switch, called [facility] and they stated they had already shipped out Atacand so advised to leave pt on that med and may change in future.  Pt is aware, he has received his Atacand and is agreeable to continue with this med.

## 2011-10-07 ENCOUNTER — Encounter: Payer: Self-pay | Admitting: Internal Medicine

## 2011-10-07 ENCOUNTER — Ambulatory Visit (INDEPENDENT_AMBULATORY_CARE_PROVIDER_SITE_OTHER): Payer: BC Managed Care – PPO | Admitting: Internal Medicine

## 2011-10-07 DIAGNOSIS — Z95810 Presence of automatic (implantable) cardiac defibrillator: Secondary | ICD-10-CM

## 2011-10-07 DIAGNOSIS — I428 Other cardiomyopathies: Secondary | ICD-10-CM

## 2011-10-07 DIAGNOSIS — I509 Heart failure, unspecified: Secondary | ICD-10-CM

## 2011-10-07 DIAGNOSIS — I5042 Chronic combined systolic (congestive) and diastolic (congestive) heart failure: Secondary | ICD-10-CM

## 2011-10-07 LAB — ICD DEVICE OBSERVATION
BRDY-0002RV: 40 {beats}/min
BRDY-0004RV: 110 {beats}/min
DEVICE MODEL ICD: 550952
FVT: 0
PACEART VT: 0
RV LEAD AMPLITUDE: 9.7 mv
RV LEAD IMPEDENCE ICD: 362.5 Ohm
TOT-0007: 1
TZAT-0001FASTVT: 1
TZAT-0001SLOWVT: 1
TZAT-0004SLOWVT: 8
TZAT-0012FASTVT: 200 ms
TZAT-0012SLOWVT: 200 ms
TZAT-0013FASTVT: 2
TZAT-0018SLOWVT: NEGATIVE
TZAT-0019FASTVT: 7.5 V
TZAT-0019SLOWVT: 7.5 V
TZAT-0020FASTVT: 1 ms
TZON-0003SLOWVT: 330 ms
TZON-0004FASTVT: 16
TZON-0004SLOWVT: 24
TZON-0005FASTVT: 6
TZST-0001FASTVT: 4
TZST-0001SLOWVT: 3
TZST-0001SLOWVT: 5
TZST-0003FASTVT: 36 J
TZST-0003SLOWVT: 15 J
VF: 0

## 2011-10-07 NOTE — Patient Instructions (Signed)
Your physician wants you to follow-up in: 12 months with Dr Court Joy will receive a reminder letter in the mail two months in advance. If you don't receive a letter, please call our office to schedule the follow-up appointment.  Merlin transmission on 01/08/2012

## 2011-10-08 ENCOUNTER — Encounter: Payer: Self-pay | Admitting: Internal Medicine

## 2011-10-08 NOTE — Assessment & Plan Note (Signed)
His device is working normally. He will recheck in several months.

## 2011-10-08 NOTE — Assessment & Plan Note (Signed)
His symptoms are class 1-2. He will continue his regular exercise, a low sodium diet and his current medical therapy.

## 2011-10-08 NOTE — Assessment & Plan Note (Signed)
He has class 1-2 symptoms. He will reduce his sodium intake and continue his current medical therapy.

## 2011-10-08 NOTE — Progress Notes (Signed)
HPI Mr. Quast returns today for followup. He is a pleasant middle aged man with a h/o ICM, class 1 CHF, s/p ICD implant and dyslipidemia. 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 No chest pain, sob, or peripheral edema.He has had no ICD shocks. Allergies  Allergen Reactions  . Atorvastatin     REACTION: myalgia  . Sulfonamide Derivatives      Current Outpatient Prescriptions  Medication Sig Dispense Refill  . aspirin 81 MG tablet Take 81 mg by mouth daily.        . candesartan (ATACAND) 4 MG tablet Take 1 tablet (4 mg total) by mouth daily.  90 tablet  3  . cetirizine (ZYRTEC) 10 MG tablet Take 10 mg by mouth daily.        . Fish Oil OIL Take 1,000 mg by mouth 3 (three) times daily.       . metoprolol succinate (TOPROL-XL) 25 MG 24 hr tablet 1/2 tab two times a day.  90 tablet  3  . niacin (NIASPAN) 1000 MG CR tablet Take 1 tablet (1,000 mg total) by mouth at bedtime.  90 tablet  3  . nitroGLYCERIN (NITROSTAT) 0.4 MG SL tablet Place 1 tablet (0.4 mg total) under the tongue every 5 (five) minutes as needed for chest pain.  25 tablet  11  . simvastatin (ZOCOR) 40 MG tablet Take 1 tablet (40 mg total) by mouth at bedtime.  90 tablet  3     Past Medical History  Diagnosis Date  . CAD (coronary artery disease)     a- S/p cabg in march 2006 by Dr.Owen;  b. myoview 5/12: large IL scar from apex to base, no ischemia, EF 37%  . Ischemic cardiomyopathy     a-Echocardiogram, Nov 2006, showed ejection fraction of 30-40% with mild to moderated mitral  regurgitation and mild aortic regurgitation b- Cardiac MRI, May 2007, EF of 44% with 50% scar involving  the inferolateral walls. No comment of mitral regurgitation. c- last echo  11/10: EF 30-35%  mod MR d- Nega T-Wave alternans testing in May of 2007 e- no ACE due to low BP;  f. CPX 3/11: normal  . Gout   . Hyperlipidemia   . DM (diabetes mellitus)   . ED (erectile dysfunction)   . Bradycardia     Use of beta blocker limited  . RBBB (right  bundle branch block with left anterior fascicular block)   . Systolic CHF, chronic   . AICD (automatic cardioverter/defibrillator) present 6/09    St. Jude    ROS:   All systems reviewed and negative except as noted in the HPI.   Past Surgical History  Procedure Date  . Tonsillectomy     as a child  . Coronary artery bypass graft 02-2005  . Orif ankle fracture 1987    left   . Shoulder arthroscopy 02/2010    rt   . Cardiac defibrillator placement 06/01/08    ICD     Family History  Problem Relation Age of Onset  . Heart attack Father 51  . Cardiomyopathy Brother   . Colon cancer Neg Hx   . Prostate cancer Neg Hx      History   Social History  . Marital Status: Married    Spouse Name: N/A    Number of Children: 2  . Years of Education: N/A   Occupational History  . 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  Social History Main Topics  . Smoking status: Former Smoker -- 0.1 packs/day    Types: Cigarettes    Quit date: 12/16/1999  . Smokeless tobacco: Never Used  . Alcohol Use: 0.0 oz/week     socially  . Drug Use: No  . Sexually Active: Not on file   Other Topics Concern  . Not on file   Social History Narrative   Exercise-- bike 50 min / day.Marland KitchenMarland KitchenMarland KitchenDiet-- ok     BP 120/62  Pulse 60  Ht 5' 9.5" (1.765 m)  Wt 192 lb 6.4 oz (87.272 kg)  BMI 28.01 kg/m2  Physical Exam:  Well appearing NAD HEENT: Unremarkable Neck:  No JVD, no thyromegally Lymphatics:  No adenopathy Back:  No CVA tenderness Lungs:  Clear with no wheezes, rales or rhonchi. HEART:  Regular rate rhythm, no murmurs, no rubs, no clicks Abd:  soft, positive bowel sounds, no organomegally, no rebound, no guarding Ext:  2 plus pulses, no edema, no cyanosis, no clubbing Skin:  No rashes no nodules Neuro:  CN II through XII intact, motor grossly intact  DEVICE  Normal device function.  See PaceArt for details.   Assess/Plan:

## 2011-10-10 ENCOUNTER — Encounter: Payer: Self-pay | Admitting: Internal Medicine

## 2011-10-10 ENCOUNTER — Ambulatory Visit (INDEPENDENT_AMBULATORY_CARE_PROVIDER_SITE_OTHER): Payer: BC Managed Care – PPO | Admitting: Internal Medicine

## 2011-10-10 DIAGNOSIS — Z23 Encounter for immunization: Secondary | ICD-10-CM

## 2011-10-10 DIAGNOSIS — E119 Type 2 diabetes mellitus without complications: Secondary | ICD-10-CM

## 2011-10-10 DIAGNOSIS — I2510 Atherosclerotic heart disease of native coronary artery without angina pectoris: Secondary | ICD-10-CM

## 2011-10-10 DIAGNOSIS — I509 Heart failure, unspecified: Secondary | ICD-10-CM

## 2011-10-10 DIAGNOSIS — R7309 Other abnormal glucose: Secondary | ICD-10-CM

## 2011-10-10 LAB — BASIC METABOLIC PANEL
BUN: 16 mg/dL (ref 6–23)
Calcium: 9 mg/dL (ref 8.4–10.5)
Creatinine, Ser: 0.8 mg/dL (ref 0.4–1.5)
GFR: 107.46 mL/min (ref >60.00)
Glucose, Bld: 120 mg/dL — ABNORMAL HIGH (ref 70–99)
Sodium: 139 mEq/L (ref 135–145)

## 2011-10-10 NOTE — Assessment & Plan Note (Signed)
Had CP, neg stress test 5-12, doing well

## 2011-10-10 NOTE — Assessment & Plan Note (Signed)
Well controlled , asx  Labs

## 2011-10-10 NOTE — Assessment & Plan Note (Signed)
Diet controlled Labs 

## 2011-10-10 NOTE — Progress Notes (Signed)
  Subjective:    Patient ID: Matthew Joyce, male    DOB: 09/02/1951, 60 y.o.   MRN: [id number]  HPI Routine office visit Heart disease--chart reviewed, had a negative stress test in 04/2011. No further chest pain. He thinks it was related to stress which is now much decreased. Diabetes--on diet control only, noncontributory brochures. Gout --no recent episodes.  Past Medical History  Diagnosis Date  . CAD (coronary artery disease)     a- S/p cabg in march 2006 by Dr.Owen;  b. myoview 5/12: large IL scar from apex to base, no ischemia, EF 37%  . Ischemic cardiomyopathy     a-Echocardiogram, Nov 2006, showed ejection fraction of 30-40% with mild to moderated mitral  regurgitation and mild aortic regurgitation b- Cardiac MRI, May 2007, EF of 44% with 50% scar involving  the inferolateral walls. No comment of mitral regurgitation. c- last echo  11/10: EF 30-35%  mod MR d- Nega T-Wave alternans testing in May of 2007 e- no ACE due to low BP;  f. CPX 3/11: normal  . Gout   . Hyperlipidemia   . DM (diabetes mellitus)   . ED (erectile dysfunction)   . Bradycardia     Use of beta blocker limited  . 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 Systolic CHF, chronic   . AICD (automatic cardioverter/defibrillator) present 6/09    St. Jude     Review of Systems No chest pain or shortness or breath Diet is good, he remains active. Note nausea, vomiting, diarrhea.     Objective:   Physical Exam  Constitutional: He is oriented to person, place, and time. He appears well-developed and well-nourished.  HENT:  Head: Normocephalic and atraumatic.  Cardiovascular: Normal rate, regular rhythm and normal heart sounds.   No murmur heard. Pulmonary/Chest: Effort normal and breath sounds normal. No respiratory distress. He has no wheezes. He has no rales.  Musculoskeletal: He exhibits no edema.  Neurological: He is alert and oriented to person, place, and time.    Psychiatric: He has a normal mood and affect. His behavior is normal. Judgment and thought content normal.          Assessment & Plan:

## 2011-12-23 ENCOUNTER — Ambulatory Visit (HOSPITAL_COMMUNITY)
Admission: RE | Admit: 2011-12-23 | Discharge: 2011-12-23 | Disposition: A | Discharge status: Home / Self Care | Payer: BC Managed Care – PPO | Source: Ambulatory Visit | Attending: Internal Medicine | Admitting: Internal Medicine

## 2011-12-23 VITALS — BP 106/54 | HR 70 | Wt 193.2 lb

## 2011-12-23 DIAGNOSIS — E785 Hyperlipidemia, unspecified: Secondary | ICD-10-CM

## 2011-12-23 DIAGNOSIS — I2510 Atherosclerotic heart disease of native coronary artery without angina pectoris: Secondary | ICD-10-CM

## 2011-12-23 DIAGNOSIS — I5022 Chronic systolic (congestive) heart failure: Secondary | ICD-10-CM

## 2011-12-23 NOTE — Assessment & Plan Note (Signed)
Doing very well. NYHA I. Volume status looks good. Med titration limited by BP and HR. Continue current meds.

## 2011-12-23 NOTE — Assessment & Plan Note (Signed)
Lipids look good. Continue statin. Discussed results of AIM-HIGH and suggested he could stop Niacin if he wanted to. He will cut back.

## 2011-12-23 NOTE — Assessment & Plan Note (Signed)
No evidence of ischemia. Continue current regimen.   

## 2011-12-23 NOTE — Patient Instructions (Signed)
Your physician wants you to follow-up in:  6 months. You will receive a reminder letter in the mail two months in advance. If you don't receive a letter, please call our office to schedule the follow-up appointment.   

## 2011-12-23 NOTE — Progress Notes (Signed)
History of Present Illness: Primary Cardiologist:  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  He also had mod LV dysfunction. EF 30-35%He is s/p ICD.  There is also history of ischemic mitral regurgitation.  Remainder of medical history is for diabetes, hyperlipidemia and bradycardia. Ace-I previously stopped due to hypotension.  B-blocker dosing limited by bradycardia. Had Myoview last year. He walked for over 11:00 and had no chest pain.  He had an old scar on his images but no ischemia.  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 Feels much better. No further CP.  He denies DOE, orthopnea, PND, edema or syncope.  Riding Air-Dyne 50 minutes per day. Tolerating meds well. Occasional flushing with Niaspan. Lipids followed by Dr. Drue Novel.  Lab Results  Component Value Date   CHOL 147 04/11/2011   HDL 51.80 04/11/2011   LDLCALC 81 04/11/2011   TRIG 73.0 04/11/2011   CHOLHDL 3 04/11/2011     Past Medical History  Diagnosis Date  . CAD (coronary artery disease)     a- S/p cabg in march 2006 by Dr.Owen;  b. myoview 5/12: large IL scar from apex to base, no ischemia, EF 37%  . Ischemic cardiomyopathy     a-Echocardiogram, Nov 2006, showed ejection fraction of 30-40% with mild to moderated mitral  regurgitation and mild aortic regurgitation b- Cardiac MRI, May 2007, EF of 44% with 50% scar involving  the inferolateral walls. No comment of mitral regurgitation. c- last echo  11/10: EF 30-35%  mod MR d- Nega T-Wave alternans testing in May of 2007 e- no ACE due to low BP;  f. CPX 3/11: normal  . Gout   . Hyperlipidemia   . DM (diabetes mellitus)   . ED (erectile dysfunction)   . Bradycardia     Use of beta blocker limited  . 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 Systolic CHF, chronic   . AICD (automatic  cardioverter/defibrillator) present 6/09    St. Jude    Current Outpatient Prescriptions  Medication Sig Dispense Refill  . aspirin 81 MG tablet Take 81 mg by mouth daily.        . candesartan (ATACAND) 4 MG tablet Take 1 tablet (4 mg total) by mouth daily.  90 tablet  3  . cetirizine (ZYRTEC) 10 MG tablet Take 10 mg by mouth daily.        . Fish Oil OIL Take 1,000 mg by mouth 3 (three) times daily.       . metoprolol succinate (TOPROL-XL) 25 MG 24 hr tablet 1/2 tab two times a day.  90 tablet  3  . niacin (NIASPAN) 1000 MG CR tablet Take 1 tablet (1,000 mg total) by mouth at bedtime.  90 tablet  3  . nitroGLYCERIN (NITROSTAT) 0.4 MG SL tablet Place 1 tablet (0.4 mg total) under the tongue every 5 (five) minutes as needed for chest pain.  25 tablet  11  . simvastatin (ZOCOR) 40 MG tablet Take 1 tablet (40 mg total) by mouth at bedtime.  90 tablet  3    Allergies  Allergen Reactions  . Atorvastatin     REACTION: myalgia  . Sulfonamide Derivatives    Vital Signs: BP 106/54  Pulse 70  Wt 193 lb 4 oz (87.658 kg)  SpO2 98%  PHYSICAL EXAM: Well nourished, well developed, in no acute distress HEENT: normal Neck: no JVD Cardiac:  normal S1, S2; RRR; no murmur Lungs:  clear to auscultation bilaterally, no wheezing, rhonchi or rales Abd: soft, nontender, no hepatomegaly Ext: no edema Skin: warm and dry Neuro:  CNs 2-12 intact, no focal abnormalities noted Psych: Normal affect  GXT Myoview 04/21/11:  Rest Procedure: Myocardial perfusion imaging was performed at rest 45 minutes following the intravenous administration of Technetium 85m Tetrofosmin.  Rest ECG: Sinus Bradycardia with PVCS and a RBBB  Stress Procedure: The patient exercised for 11:30. The patient stopped due to fatigue and denied any chest pain. There were no significant ST-T wave changes and occ pvcs/pacs injected at peak exercise and myocardial perfusion imaging was performed after a brief delay.  Stress ECG: No  significant change from baseline ECG   QPS  Raw Data Images: Normal; no motion artifact; normal heart/lung ratio.  Stress Images: There is decreased uptake in the lateral wall.  Rest Images: There is decreased uptake in the lateral wall.  Subtraction (SDS): There is a fixed defect that is most consistent with a previous infarction.  Transient Ischemic Dilatation (Normal <1.22): 0.95  Lung/Heart Ratio (Normal <0.45): 0.40   Quantitative Gated Spect Images  QGS EDV: 208 ml  QGS ESV: 133 ml  QGS cine images: Inferolateral and septal hypokinesis  QGS EF: 36%   Impression  Exercise Capacity: Good exercise capacity.  BP Response: Normal blood pressure response.  Clinical Symptoms: Mild chest pain/dyspnea.  ECG Impression: No significant ST segment change suggestive of ischemia.  Comparison with Prior Nuclear Study: No images to compare   Overall Impression: Larege inferolateral wall infarct from apex to base with no ischemia   ASSESSMENT AND PLAN:

## 2012-01-08 ENCOUNTER — Encounter: Payer: Self-pay | Admitting: Internal Medicine

## 2012-01-08 ENCOUNTER — Ambulatory Visit (INDEPENDENT_AMBULATORY_CARE_PROVIDER_SITE_OTHER): Payer: BC Managed Care – PPO | Admitting: *Deleted

## 2012-01-08 DIAGNOSIS — I428 Other cardiomyopathies: Secondary | ICD-10-CM

## 2012-01-08 DIAGNOSIS — Z95810 Presence of automatic (implantable) cardiac defibrillator: Secondary | ICD-10-CM

## 2012-01-08 LAB — REMOTE ICD DEVICE
BATTERY VOLTAGE: 3.1 V
BMOD-0002RV: 8
HV IMPEDENCE: 52 Ohm
RV LEAD AMPLITUDE: 9.2 mv
RV LEAD IMPEDENCE ICD: 360 Ohm
TZAT-0001FASTVT: 1
TZAT-0001SLOWVT: 1
TZAT-0012FASTVT: 200 ms
TZAT-0012SLOWVT: 200 ms
TZAT-0013FASTVT: 2
TZAT-0019SLOWVT: 7.5 V
TZAT-0020SLOWVT: 1 ms
TZON-0004SLOWVT: 24
TZON-0005SLOWVT: 6
TZON-0010SLOWVT: 80 ms
TZST-0001FASTVT: 2
TZST-0001FASTVT: 5
TZST-0001SLOWVT: 2
TZST-0001SLOWVT: 4
TZST-0003FASTVT: 25 J
TZST-0003FASTVT: 36 J
TZST-0003FASTVT: 36 J
TZST-0003SLOWVT: 15 J
TZST-0003SLOWVT: 36 J
VENTRICULAR PACING ICD: 1 pct

## 2012-01-13 NOTE — Progress Notes (Signed)
Remote icd check  

## 2012-01-21 ENCOUNTER — Encounter: Payer: Self-pay | Admitting: *Deleted

## 2012-04-08 ENCOUNTER — Encounter: Payer: Self-pay | Admitting: Internal Medicine

## 2012-04-08 ENCOUNTER — Ambulatory Visit (INDEPENDENT_AMBULATORY_CARE_PROVIDER_SITE_OTHER): Payer: BC Managed Care – PPO | Admitting: *Deleted

## 2012-04-08 DIAGNOSIS — I428 Other cardiomyopathies: Secondary | ICD-10-CM

## 2012-04-09 ENCOUNTER — Ambulatory Visit (INDEPENDENT_AMBULATORY_CARE_PROVIDER_SITE_OTHER): Payer: BC Managed Care – PPO | Admitting: Internal Medicine

## 2012-04-09 DIAGNOSIS — Z0000 Encounter for general adult medical examination without abnormal findings: Secondary | ICD-10-CM

## 2012-04-09 DIAGNOSIS — E785 Hyperlipidemia, unspecified: Secondary | ICD-10-CM

## 2012-04-09 DIAGNOSIS — R7309 Other abnormal glucose: Secondary | ICD-10-CM

## 2012-04-09 LAB — REMOTE ICD DEVICE
BATTERY VOLTAGE: 3.05 V
BMOD-0002RV: 8
RV LEAD IMPEDENCE ICD: 350 Ohm
TZAT-0001FASTVT: 1
TZAT-0004FASTVT: 8
TZAT-0012FASTVT: 200 ms
TZAT-0013SLOWVT: 4
TZAT-0020FASTVT: 1 ms
TZST-0001FASTVT: 3
TZST-0001FASTVT: 5
TZST-0001SLOWVT: 2
TZST-0001SLOWVT: 5
TZST-0003FASTVT: 25 J
TZST-0003SLOWVT: 15 J
TZST-0003SLOWVT: 36 J
VENTRICULAR PACING ICD: 1 pct

## 2012-04-09 LAB — HEMOGLOBIN A1C: Hgb A1c MFr Bld: 6.1 % (ref 4.6–6.5)

## 2012-04-09 LAB — BASIC METABOLIC PANEL
CO2: 29 mEq/L (ref 19–32)
Calcium: 9.3 mg/dL (ref 8.4–10.5)
Chloride: 105 mEq/L (ref 96–112)
Glucose, Bld: 103 mg/dL — ABNORMAL HIGH (ref 70–99)
Potassium: 4.5 mEq/L (ref 3.5–5.1)
Sodium: 141 mEq/L (ref 135–145)

## 2012-04-09 LAB — LIPID PANEL
HDL: 55 mg/dL (ref >39.00)
Total CHOL/HDL Ratio: 3
VLDL: 13.4 mg/dL (ref 0.0–40.0)

## 2012-04-09 LAB — AST: AST: 19 U/L (ref 0–37)

## 2012-04-09 LAB — CBC WITH DIFFERENTIAL/PLATELET
Basophils Relative: 0.8 % (ref 0.0–3.0)
Eosinophils Absolute: 0.2 10*3/uL (ref 0.0–0.7)
HCT: 42.4 % (ref 39.0–52.0)
Hemoglobin: 14.2 g/dL (ref 13.0–17.0)
Lymphocytes Relative: 37.4 % (ref 12.0–46.0)
Lymphs Abs: 2.1 10*3/uL (ref 0.7–4.0)
MCHC: 33.5 g/dL (ref 30.0–36.0)
Neutro Abs: 2.3 10*3/uL (ref 1.4–7.7)
RBC: 4.58 Mil/uL (ref 4.22–5.81)

## 2012-04-09 LAB — PSA: PSA: 0.49 ng/mL (ref 0.10–4.00)

## 2012-04-09 NOTE — Assessment & Plan Note (Signed)
Td 2008 pneumonia shot 2009 History of herpes zoster ~  2007 01-2008: Cscope, tics, no polyps, next in 10 years doing well, cont.  with healthy lifestyle  labs including a PSA.

## 2012-04-09 NOTE — Assessment & Plan Note (Addendum)
Patient reports he will stop niacin  once he ran out 9per his discussion w/ cards).

## 2012-04-09 NOTE — Progress Notes (Signed)
  Subjective:    Patient ID: Matthew Joyce, male    DOB: 1951/08/17, 61 y.o.   MRN: [id number]  HPI CPX   Past Medical History: Gout Hyperlipidemia AODM ED CV: CAD, s/p CABG  March 2006 by Ischemic cardiomyopathy. Altace stopped due to symptomatic hypotension. B-block limited due to bradycardia s/p St. jude single chamber ICD chronic right bundle branch block.  Past Surgical History: tonsilectomy as a child  Coronary artery bypass graft (02-2005) ICD 06/01/2008 ORIF    L ankle  (Fx-1987) shoulder scope (rt shoulder) 02/2010  Family History: colon ca-- no prostate ca--no DM--no MI-- F (in his 26s), lost a younger brother  who had cardiomyopathy   Social History: Married, 2 kids, 2 GK Occupation: fully retired . Tobacco-- quit ~ 2002, moderate smoker  ETOH-- socially exercise-- more active since retirement, 50 min a day, golf diet-- healthy mostly    Review of Systems  Constitutional: Negative for fever and fatigue.  Respiratory: Negative for cough and shortness of breath.   Cardiovascular: Negative for chest pain and leg swelling.  Gastrointestinal: Negative for abdominal pain and blood in stool.  Genitourinary: Negative for dysuria and hematuria.  Psychiatric/Behavioral:       No depression or anxiety        Objective:   Physical Exam General:  alert, well-developed, and well-nourished.   Neck:  no thyromegaly, normal carotid pulses.   Lungs:  normal respiratory effort, no intercostal retractions, no accessory muscle use, and normal breath sounds.   Heart:  normal rate, regular rhythm, no murmur, and no gallop.   Abdomen:  soft, non-tender, no distention, no masses, and no guarding.  No bruit, aorta not palpable Rectal:  external hemorrhoid x 1  noted. Normal sphincter tone. No rectal masses or tenderness. Prostate:  Prostate gland firm and smooth, no enlargement, nodularity, tenderness, mass, asymmetry or induration. Extremities:  no edema Psych:   Oriented X3, memory intact for recent and remote, normally interactive, good eye contact, not anxious appearing, and not depressed appearing.       Assessment & Plan:

## 2012-04-09 NOTE — Assessment & Plan Note (Signed)
Well-controlled, excellent lifestyle, check her A1c, return to the office in 6 months

## 2012-04-12 ENCOUNTER — Encounter: Payer: Self-pay | Admitting: Internal Medicine

## 2012-04-13 NOTE — Progress Notes (Signed)
Remote icd check  

## 2012-04-30 ENCOUNTER — Encounter: Payer: Self-pay | Admitting: *Deleted

## 2012-05-18 ENCOUNTER — Telehealth (HOSPITAL_COMMUNITY): Payer: Self-pay | Admitting: *Deleted

## 2012-05-18 NOTE — Telephone Encounter (Signed)
Spoke w/pt he will try Tylenol

## 2012-05-18 NOTE — Telephone Encounter (Signed)
Mr Matthew Joyce called today. He has injured his elbow and would like to know what he can take for pain medication along with his other meds. Please call him back. Thanks.

## 2012-06-21 ENCOUNTER — Telehealth (HOSPITAL_COMMUNITY): Payer: Self-pay | Admitting: *Deleted

## 2012-06-21 NOTE — Telephone Encounter (Signed)
Matthew Joyce called this morning in regards to his medication. He is [facility] for atacand.  He believes that he should be getting candesartan instead.  Dr Gala Romney told him that he should not be taking any generic meds.  Please follow up with him. Thanks.

## 2012-06-21 NOTE — Telephone Encounter (Signed)
[name], pharmacist contacted patient regarding generic Atacand. She explained to him that Atacand recently switched to generic and he is able to take the generic formulation. Matthew Joyce was appreciative of call back.

## 2012-07-15 ENCOUNTER — Encounter: Payer: Self-pay | Admitting: Internal Medicine

## 2012-07-15 ENCOUNTER — Ambulatory Visit (INDEPENDENT_AMBULATORY_CARE_PROVIDER_SITE_OTHER): Payer: BC Managed Care – PPO | Admitting: *Deleted

## 2012-07-15 DIAGNOSIS — Z95810 Presence of automatic (implantable) cardiac defibrillator: Secondary | ICD-10-CM

## 2012-07-15 DIAGNOSIS — I5022 Chronic systolic (congestive) heart failure: Secondary | ICD-10-CM

## 2012-07-16 ENCOUNTER — Encounter: Payer: Self-pay | Admitting: *Deleted

## 2012-07-16 LAB — REMOTE ICD DEVICE
BMOD-0002RV: 8
BRDY-0004RV: 110 {beats}/min
DEV-0020ICD: NEGATIVE
RV LEAD IMPEDENCE ICD: 350 Ohm
TZAT-0001FASTVT: 1
TZAT-0013FASTVT: 2
TZAT-0013SLOWVT: 4
TZAT-0018SLOWVT: NEGATIVE
TZAT-0019FASTVT: 7.5 V
TZAT-0019SLOWVT: 7.5 V
TZAT-0020FASTVT: 1 ms
TZAT-0020SLOWVT: 1 ms
TZON-0003SLOWVT: 330 ms
TZON-0004FASTVT: 16
TZON-0004SLOWVT: 24
TZON-0005FASTVT: 6
TZON-0005SLOWVT: 6
TZON-0010SLOWVT: 80 ms
TZST-0001FASTVT: 2
TZST-0001FASTVT: 4
TZST-0001SLOWVT: 2
TZST-0001SLOWVT: 5
TZST-0003FASTVT: 36 J
TZST-0003FASTVT: 36 J
TZST-0003SLOWVT: 25 J
TZST-0003SLOWVT: 36 J

## 2012-07-29 ENCOUNTER — Encounter: Payer: Self-pay | Admitting: *Deleted

## 2012-08-03 ENCOUNTER — Encounter (HOSPITAL_COMMUNITY): Payer: BC Managed Care – PPO

## 2012-08-05 ENCOUNTER — Ambulatory Visit (HOSPITAL_COMMUNITY)
Admission: RE | Admit: 2012-08-05 | Discharge: 2012-08-05 | Disposition: A | Discharge status: Home / Self Care | Payer: BC Managed Care – PPO | Source: Ambulatory Visit | Attending: Internal Medicine | Admitting: Internal Medicine

## 2012-08-05 ENCOUNTER — Encounter (HOSPITAL_COMMUNITY): Payer: Self-pay

## 2012-08-05 VITALS — BP 118/68 | HR 62 | Ht 69.0 in | Wt 188.0 lb

## 2012-08-05 DIAGNOSIS — I2510 Atherosclerotic heart disease of native coronary artery without angina pectoris: Secondary | ICD-10-CM | POA: Insufficient documentation

## 2012-08-05 DIAGNOSIS — I5042 Chronic combined systolic (congestive) and diastolic (congestive) heart failure: Secondary | ICD-10-CM | POA: Insufficient documentation

## 2012-08-05 MED ORDER — CANDESARTAN CILEXETIL 4 MG PO TABS: 4.0000 mg | ORAL_TABLET | Freq: Every day | ORAL | Status: DC

## 2012-08-05 MED ORDER — METOPROLOL SUCCINATE ER 25 MG PO TB24: ORAL_TABLET | ORAL | Status: DC

## 2012-08-05 MED ORDER — SIMVASTATIN 40 MG PO TABS: 40.0000 mg | ORAL_TABLET | Freq: Every day | ORAL | Status: DC

## 2012-08-05 NOTE — Progress Notes (Signed)
History of Present Illness: Primary Cardiologist:  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  He also had mod LV dysfunction. EF 30-35% He is s/p ICD.  There is also history of ischemic mitral regurgitation.  Remainder of medical history is for diabetes, hyperlipidemia and bradycardia. Ace-I previously stopped due to hypotension.  B-blocker dosing limited by bradycardia. Had Myoview 5/12. He walked for over 11:00 and had no chest pain.  He had an old scar on his images but no ischemia.  His EF is 36%  Doing well. Feels very good. 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 No CP or dyspnea. Compliant with all his med. BP runs on the low end.    Lab Results  Component Value Date   CHOL 149 04/09/2012   HDL 55.00 04/09/2012   LDLCALC 81 04/09/2012   TRIG 67.0 04/09/2012   CHOLHDL 3 04/09/2012    No results found for this basename: LDLDIRECT    Past Medical History  Diagnosis Date  . CAD (coronary artery disease)     a- S/p cabg in march 2006 by Dr.Owen;  b. myoview 5/12: large IL scar from apex to base, no ischemia, EF 37%  . Ischemic cardiomyopathy     a-Echocardiogram, Nov 2006, showed ejection fraction of 30-40% with mild to moderated mitral  regurgitation and mild aortic regurgitation b- Cardiac MRI, May 2007, EF of 44% with 50% scar involving  the inferolateral walls. No comment of mitral regurgitation. c- last echo  11/10: EF 30-35%  mod MR d- Nega T-Wave alternans testing in May of 2007 e- no ACE due to low BP;  f. CPX 3/11: normal  . Gout   . Hyperlipidemia   . DM (diabetes mellitus)   . ED (erectile dysfunction)   . Bradycardia     Use of beta blocker limited  . 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 Systolic CHF, chronic   . AICD (automatic cardioverter/defibrillator) present 6/09    St. Jude    Current  Outpatient Prescriptions  Medication Sig Dispense Refill  . aspirin 81 MG tablet Take 81 mg by mouth daily.        . candesartan (ATACAND) 4 MG tablet Take 1 tablet (4 mg total) by mouth daily.  90 tablet  3  . cetirizine (ZYRTEC) 10 MG tablet Take 10 mg by mouth daily.        . Fish Oil OIL Take 1,000 mg by mouth 3 (three) times daily.       . metoprolol succinate (TOPROL-XL) 25 MG 24 hr tablet 1/2 tab two times a day.  90 tablet  3  . nitroGLYCERIN (NITROSTAT) 0.4 MG SL tablet Place 1 tablet (0.4 mg total) under the tongue every 5 (five) minutes as needed for chest pain.  25 tablet  11  . simvastatin (ZOCOR) 40 MG tablet Take 1 tablet (40 mg total) by mouth at bedtime.  90 tablet  3    Allergies  Allergen Reactions  . Atorvastatin     REACTION: myalgia  . Sulfonamide Derivatives    Vital Signs: BP 118/68  Pulse 62  Ht 5\' 9"  (1.753 m)  Wt 188 lb (85.276 kg)  BMI 27.76 kg/m2  SpO2 98%  PHYSICAL EXAM: Well nourished, well developed, in no acute distress HEENT: normal Neck: no JVD Cardiac:  normal S1, S2; RRR; no murmur Lungs:  clear to auscultation bilaterally, no wheezing, rhonchi or rales Abd: soft, nontender, no hepatomegaly Ext: no edema Skin: warm and dry Neuro:  CNs 2-12 intact, no focal abnormalities noted Psych: Normal affect  GXT Myoview 04/21/11:  Rest Procedure: Myocardial perfusion imaging was performed at rest 45 minutes following the intravenous administration of Technetium 22m Tetrofosmin.  Rest ECG: Sinus Bradycardia with PVCS and a RBBB  Stress Procedure: The patient exercised for 11:30. The patient stopped due to fatigue and denied any chest pain. There were no significant ST-T wave changes and occ pvcs/pacs injected at peak exercise and myocardial perfusion imaging was performed after a brief delay.  Stress ECG: No significant change from baseline ECG   QPS  Raw Data Images: Normal; no motion artifact; normal heart/lung ratio.  Stress Images: There is  decreased uptake in the lateral wall.  Rest Images: There is decreased uptake in the lateral wall.  Subtraction (SDS): There is a fixed defect that is most consistent with a previous infarction.  Transient Ischemic Dilatation (Normal <1.22): 0.95  Lung/Heart Ratio (Normal <0.45): 0.40   Quantitative Gated Spect Images  QGS EDV: 208 ml  QGS ESV: 133 ml  QGS cine images: Inferolateral and septal hypokinesis  QGS EF: 36%   Impression  Exercise Capacity: Good exercise capacity.  BP Response: Normal blood pressure response.  Clinical Symptoms: Mild chest pain/dyspnea.  ECG Impression: No significant ST segment change suggestive of ischemia.  Comparison with Prior Nuclear Study: No images to compare   Overall Impression: [place] inferolateral wall infarct from apex to base with no ischemia   ASSESSMENT AND PLAN:

## 2012-08-05 NOTE — Patient Instructions (Addendum)
Your physician has requested that you have an echocardiogram. Echocardiography is a painless test that uses sound waves to create images of your heart. It provides your doctor with information about the size and shape of your heart and how well your heart's chambers and valves are working. This procedure takes approximately one hour. There are no restrictions for this procedure.  We will contact you in 6 months to schedule your next appointment.  

## 2012-08-05 NOTE — Assessment & Plan Note (Signed)
Doing very well despite his LV dysfunction. NYHA I. Volume status looks good. Unable to tolerate higher doses of ARB or b-blocker due to symptomatic hypotension. Is due for repeat echo. No ICD firings.

## 2012-08-05 NOTE — Assessment & Plan Note (Signed)
No evidence of ischemia. Continue current regimen.   

## 2012-08-05 NOTE — Addendum Note (Signed)
Encounter addended by: Noralee Space, RN on: 08/05/2012  3:07 PM<BR>     Documentation filed: Patient Instructions Section, Orders

## 2012-08-10 ENCOUNTER — Ambulatory Visit (HOSPITAL_COMMUNITY)
Admission: RE | Admit: 2012-08-10 | Discharge: 2012-08-10 | Disposition: A | Discharge status: Home / Self Care | Payer: BC Managed Care – PPO | Source: Ambulatory Visit | Attending: Internal Medicine | Admitting: Internal Medicine

## 2012-08-10 DIAGNOSIS — I509 Heart failure, unspecified: Secondary | ICD-10-CM | POA: Insufficient documentation

## 2012-08-10 DIAGNOSIS — I379 Nonrheumatic pulmonary valve disorder, unspecified: Secondary | ICD-10-CM | POA: Insufficient documentation

## 2012-08-10 DIAGNOSIS — I079 Rheumatic tricuspid valve disease, unspecified: Secondary | ICD-10-CM | POA: Insufficient documentation

## 2012-08-10 DIAGNOSIS — Z87891 Personal history of nicotine dependence: Secondary | ICD-10-CM | POA: Insufficient documentation

## 2012-08-10 DIAGNOSIS — I059 Rheumatic mitral valve disease, unspecified: Secondary | ICD-10-CM

## 2012-08-10 DIAGNOSIS — I2510 Atherosclerotic heart disease of native coronary artery without angina pectoris: Secondary | ICD-10-CM | POA: Insufficient documentation

## 2012-08-10 DIAGNOSIS — E119 Type 2 diabetes mellitus without complications: Secondary | ICD-10-CM | POA: Insufficient documentation

## 2012-08-10 NOTE — Progress Notes (Signed)
  Echocardiogram 2D Echocardiogram has been performed.  Ai Sonnenfeld 08/10/2012, 11:45 AM

## 2012-10-11 ENCOUNTER — Ambulatory Visit (INDEPENDENT_AMBULATORY_CARE_PROVIDER_SITE_OTHER): Payer: BC Managed Care – PPO | Admitting: Internal Medicine

## 2012-10-11 ENCOUNTER — Encounter: Payer: Self-pay | Admitting: Internal Medicine

## 2012-10-11 VITALS — BP 108/70 | HR 59 | Temp 98.1°F | Wt 192.0 lb

## 2012-10-11 DIAGNOSIS — R7309 Other abnormal glucose: Secondary | ICD-10-CM

## 2012-10-11 DIAGNOSIS — Z0000 Encounter for general adult medical examination without abnormal findings: Secondary | ICD-10-CM

## 2012-10-11 DIAGNOSIS — Z23 Encounter for immunization: Secondary | ICD-10-CM

## 2012-10-11 DIAGNOSIS — I2510 Atherosclerotic heart disease of native coronary artery without angina pectoris: Secondary | ICD-10-CM

## 2012-10-11 DIAGNOSIS — E785 Hyperlipidemia, unspecified: Secondary | ICD-10-CM

## 2012-10-11 NOTE — Progress Notes (Signed)
  Subjective:    Patient ID: Matthew Joyce, male    DOB: Apr 30, 1951, 61 y.o.   MRN: [id number]  HPI Routine office visit CAD--Doing great. Good medication compliance. Has not needed nitroglycerin. CHF, note  from the CHF clinic reviewed, stable Diabetes, he remains extremely active.  Past Medical History: Gout Hyperlipidemia AODM ED CV: CAD, s/p CABG  March 2006 by Ischemic cardiomyopathy. Altace stopped due to symptomatic hypotension. B-block limited due to bradycardia s/p St. jude single chamber ICD chronic right bundle branch block.   Past Surgical History: tonsilectomy as a child   Coronary artery bypass graft (02-2005) ICD 06/01/2008 ORIF    L ankle  (Fx-1987) shoulder scope (rt shoulder) 02/2010  Family History: colon ca-- no prostate ca--no DM--no MI-- F (in his 86s), lost a younger brother  who had cardiomyopathy   Social History: Married, 2 kids, 2 GK Occupation: fully retired . Tobacco-- quit ~ 2002, moderate smoker   ETOH-- socially exercise-- more active since retirement, 50 min a day, golf diet-- healthy mostly     Review of Systems No chest pain or shortness of breath; no lower extremity edema No anxiety or depression.     Objective:   Physical Exam General -- alert, well-developed Lungs -- normal respiratory effort, no intercostal retractions, no accessory muscle use, and normal breath sounds.   Heart-- normal rate, regular rhythm, no murmur, and no gallop.   Extremities-- no pretibial edema bilaterally  Neurologic-- alert & oriented X3 and strength normal in all extremities. Psych-- Cognition and judgment appear intact. Alert and cooperative with normal attention span and concentration.  not anxious appearing and not depressed appearing.       Assessment & Plan:

## 2012-10-11 NOTE — Assessment & Plan Note (Signed)
Well-controlled per last FLP 

## 2012-10-11 NOTE — Assessment & Plan Note (Signed)
A1c has been consistently around 6.0, he continue to have a healthy lifestyle. Recheck the A1c on return to the office

## 2012-10-11 NOTE — Assessment & Plan Note (Signed)
we discussed zostavax, will think about it.

## 2012-10-11 NOTE — Assessment & Plan Note (Signed)
asx

## 2012-10-12 ENCOUNTER — Encounter: Payer: Self-pay | Admitting: Internal Medicine

## 2012-10-12 ENCOUNTER — Ambulatory Visit (INDEPENDENT_AMBULATORY_CARE_PROVIDER_SITE_OTHER): Payer: BC Managed Care – PPO | Admitting: Internal Medicine

## 2012-10-12 VITALS — BP 119/71 | HR 60 | Ht 69.0 in | Wt 192.0 lb

## 2012-10-12 DIAGNOSIS — I5022 Chronic systolic (congestive) heart failure: Secondary | ICD-10-CM

## 2012-10-12 DIAGNOSIS — Z951 Presence of aortocoronary bypass graft: Secondary | ICD-10-CM

## 2012-10-12 DIAGNOSIS — Z95810 Presence of automatic (implantable) cardiac defibrillator: Secondary | ICD-10-CM

## 2012-10-12 DIAGNOSIS — I2510 Atherosclerotic heart disease of native coronary artery without angina pectoris: Secondary | ICD-10-CM

## 2012-10-12 LAB — ICD DEVICE OBSERVATION
BATTERY VOLTAGE: 2.9478 v
BMOD-0002RV: 8
BRDY-0002RV: 40 {beats}/min
BRDY-0004RV: 110 {beats}/min
CHARGE TIME: 11.6 s
DEV-0020ICD: NEGATIVE
DEVICE MODEL ICD: 550952
FVT: 0
HV IMPEDENCE: 48 Ohm
PACEART VT: 0
RV LEAD AMPLITUDE: 8.3 mv
RV LEAD IMPEDENCE ICD: 350 Ohm
RV LEAD THRESHOLD: 1 v
TOT-0007: 1
TOT-0008: 0
TOT-0009: 1
TOT-0010: 18
TZAT-0001FASTVT: 1
TZAT-0001SLOWVT: 1
TZAT-0004FASTVT: 8
TZAT-0004SLOWVT: 8
TZAT-0012FASTVT: 200 ms
TZAT-0012SLOWVT: 200 ms
TZAT-0013FASTVT: 2
TZAT-0013SLOWVT: 4
TZAT-0018FASTVT: NEGATIVE
TZAT-0018SLOWVT: NEGATIVE
TZAT-0019FASTVT: 7.5 v
TZAT-0019SLOWVT: 7.5 v
TZAT-0020FASTVT: 1 ms
TZAT-0020SLOWVT: 1 ms
TZON-0003FASTVT: 280 ms
TZON-0003SLOWVT: 330 ms
TZON-0004FASTVT: 16
TZON-0004SLOWVT: 24
TZON-0005FASTVT: 6
TZON-0005SLOWVT: 6
TZON-0010FASTVT: 80 ms
TZON-0010SLOWVT: 80 ms
TZST-0001FASTVT: 2
TZST-0001FASTVT: 3
TZST-0001FASTVT: 4
TZST-0001FASTVT: 5
TZST-0001SLOWVT: 2
TZST-0001SLOWVT: 3
TZST-0001SLOWVT: 4
TZST-0001SLOWVT: 5
TZST-0003FASTVT: 25 J
TZST-0003FASTVT: 36 J
TZST-0003FASTVT: 36 J
TZST-0003FASTVT: 36 J
TZST-0003SLOWVT: 15 J
TZST-0003SLOWVT: 25 J
TZST-0003SLOWVT: 36 J
VENTRICULAR PACING ICD: 0.02 pct
VF: 0

## 2012-10-12 NOTE — Assessment & Plan Note (Signed)
His chronic systolic heart failure remains well compensated, class I. He will continue his current medical therapy.

## 2012-10-12 NOTE — Assessment & Plan Note (Signed)
He denies anginal symptoms. We'll plan to continue his current medical therapy and exercise regimen.

## 2012-10-12 NOTE — Patient Instructions (Signed)
Your physician wants you to follow-up in: 12 months with Dr. Taylor. You will receive a reminder letter in the mail two months in advance. If you don't receive a letter, please call our office to schedule the follow-up appointment.    

## 2012-10-12 NOTE — Progress Notes (Signed)
HPI Matthew Joyce returns today for followup. He is a pleasant middle aged man with a h/o ICM, chronic class 1-2 CHF, and HTN. He is s/p ICD implant. In the interim, he has done well. He denies chest pain, shortness of breath, peripheral edema, or syncope. No ICD shocks. Allergies  Allergen Reactions  . Atorvastatin     REACTION: myalgia  . Sulfonamide Derivatives      Current Outpatient Prescriptions  Medication Sig Dispense Refill  . aspirin 81 MG tablet Take 81 mg by mouth daily.        . candesartan (ATACAND) 4 MG tablet Take 1 tablet (4 mg total) by mouth daily.  90 tablet  3  . cetirizine (ZYRTEC) 10 MG tablet Take 10 mg by mouth daily.        . Ciclopirox 1 % shampoo 4 (four) times a week.      . Fish Oil OIL Take 1,000 mg by mouth 3 (three) times daily.       . fluocinonide (LIDEX) 0.05 % external solution daily.      . metoprolol succinate (TOPROL-XL) 25 MG 24 hr tablet 1/2 tab two times a day.  90 tablet  3  . nitroGLYCERIN (NITROSTAT) 0.4 MG SL tablet Place 0.4 mg under the tongue every 5 (five) minutes as needed.      . simvastatin (ZOCOR) 40 MG tablet Take 1 tablet (40 mg total) by mouth at bedtime.  90 tablet  3  . DISCONTD: nitroGLYCERIN (NITROSTAT) 0.4 MG SL tablet Place 1 tablet (0.4 mg total) under the tongue every 5 (five) minutes as needed for chest pain.  25 tablet  11     Past Medical History  Diagnosis Date  . CAD (coronary artery disease)     a- S/p cabg in march 2006 by Dr.Owen;  b. myoview 5/12: large IL scar from apex to base, no ischemia, EF 37%  . Ischemic cardiomyopathy     a-Echocardiogram, Nov 2006, showed ejection fraction of 30-40% with mild to moderated mitral  regurgitation and mild aortic regurgitation b- Cardiac MRI, May 2007, EF of 44% with 50% scar involving  the inferolateral walls. No comment of mitral regurgitation. c- last echo  11/10: EF 30-35%  mod MR d- Nega T-Wave alternans testing in May of 2007 e- no ACE due to low BP;  f. CPX 3/11: normal   . Gout   . Hyperlipidemia   . DM (diabetes mellitus)   . ED (erectile dysfunction)   . Bradycardia     Use of beta blocker limited  . 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 Systolic CHF, chronic   . AICD (automatic cardioverter/defibrillator) present 6/09    St. Jude    ROS:   All systems reviewed and negative except as noted in the HPI.   Past Surgical History  Procedure Date  . Tonsillectomy     as a child  . Coronary artery bypass graft 02-2005  . Orif ankle fracture 1987    left   . Shoulder arthroscopy 02/2010    rt   . Cardiac defibrillator placement 06/01/08    ICD     Family History  Problem Relation Age of Onset  . Heart attack Father 24  . Cardiomyopathy Brother   . Colon cancer Neg Hx   . Prostate cancer Neg Hx      History   Social History  . Marital Status: Married    Spouse Name: N/A  Number of Children: 2  . Years of Education: N/A   Occupational History  . 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     Social History Main Topics  . Smoking status: Former Smoker -- 0.1 packs/day    Types: Cigarettes    Quit date: 12/16/1999  . Smokeless tobacco: Never Used  . Alcohol Use: 0.0 oz/week     socially  . Drug Use: No  . Sexually Active: Not on file   Other Topics Concern  . Not on file   Social History Narrative   Exercise-- bike 50 min / day.Marland KitchenMarland KitchenMarland KitchenDiet-- ok     BP 119/71  Pulse 60  Ht 5\' 9"  (1.753 m)  Wt 192 lb (87.091 kg)  BMI 28.35 kg/m2  SpO2 98%  Physical Exam:  Well appearing middle-aged man, NAD HEENT: Unremarkable Neck:  No JVD, no thyromegally Lungs:  Clear with no wheezes, rales, or rhonchi. HEART:  Regular rate rhythm, no murmurs, no rubs, no clicks Abd:  soft, positive bowel sounds, no organomegally, no rebound, no guarding Ext:  2 plus pulses, no edema, no cyanosis, no clubbing Skin:  No rashes no nodules Neuro:  CN II through XII intact, motor  grossly intact  DEVICE  Normal device function.  See PaceArt for details.   Assess/Plan:

## 2012-10-12 NOTE — Assessment & Plan Note (Signed)
His St. Jude single-chamber ICD is working normally. We'll plan to recheck in several months. 

## 2013-01-17 ENCOUNTER — Ambulatory Visit (INDEPENDENT_AMBULATORY_CARE_PROVIDER_SITE_OTHER): Payer: BC Managed Care – PPO | Admitting: *Deleted

## 2013-01-17 ENCOUNTER — Encounter: Payer: Self-pay | Admitting: Internal Medicine

## 2013-01-17 DIAGNOSIS — I428 Other cardiomyopathies: Secondary | ICD-10-CM

## 2013-01-17 DIAGNOSIS — Z95810 Presence of automatic (implantable) cardiac defibrillator: Secondary | ICD-10-CM

## 2013-01-17 LAB — REMOTE ICD DEVICE
BATTERY VOLTAGE: 2.9 V
BMOD-0002RV: 8
RV LEAD IMPEDENCE ICD: 350 Ohm
TZAT-0001FASTVT: 1
TZAT-0001SLOWVT: 1
TZAT-0004FASTVT: 8
TZAT-0012FASTVT: 200 ms
TZAT-0013SLOWVT: 4
TZAT-0020FASTVT: 1 ms
TZON-0010SLOWVT: 80 ms
TZST-0001FASTVT: 3
TZST-0001FASTVT: 5
TZST-0001SLOWVT: 2
TZST-0001SLOWVT: 5
TZST-0003FASTVT: 25 J
TZST-0003SLOWVT: 15 J
TZST-0003SLOWVT: 36 J
VENTRICULAR PACING ICD: 1 pct

## 2013-02-03 ENCOUNTER — Encounter: Payer: Self-pay | Admitting: *Deleted

## 2013-02-10 ENCOUNTER — Ambulatory Visit (HOSPITAL_COMMUNITY)
Admission: RE | Admit: 2013-02-10 | Discharge: 2013-02-10 | Disposition: A | Discharge status: Home / Self Care | Payer: BC Managed Care – PPO | Source: Ambulatory Visit | Attending: Internal Medicine | Admitting: Internal Medicine

## 2013-02-10 VITALS — BP 116/64 | HR 62 | Wt 195.2 lb

## 2013-02-10 DIAGNOSIS — Z95810 Presence of automatic (implantable) cardiac defibrillator: Secondary | ICD-10-CM

## 2013-02-10 DIAGNOSIS — I2510 Atherosclerotic heart disease of native coronary artery without angina pectoris: Secondary | ICD-10-CM | POA: Insufficient documentation

## 2013-02-10 DIAGNOSIS — I5042 Chronic combined systolic (congestive) and diastolic (congestive) heart failure: Secondary | ICD-10-CM | POA: Insufficient documentation

## 2013-02-10 DIAGNOSIS — Z951 Presence of aortocoronary bypass graft: Secondary | ICD-10-CM

## 2013-02-10 NOTE — Assessment & Plan Note (Addendum)
NYHA I.  Volume status looks great.  Will continue current regimen.  He has difficulty with tolerating medication   Patient seen and examined with Ulyess Blossom, PA-C. We discussed all aspects of the encounter. I agree with the assessment and plan as stated above. Continues to do very well. NYHA I. Volume status looks good. Unable to tolerate further increases in meds due to hypotension.

## 2013-02-10 NOTE — Progress Notes (Signed)
History of Present Illness: Primary Cardiologist:  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 male with a history of a coronary artery disease status post bypass surgery in 2006.  He also had mod LV dysfunction. EF 30-35% He is s/p ICD.  There is also history of ischemic mitral regurgitation.  Remainder of medical history is for diabetes, hyperlipidemia and bradycardia. Ace-I previously stopped due to hypotension.  B-blocker dosing limited by bradycardia. Had Myoview 5/12. He walked for over 11:00 and had no chest pain.  He had an old scar on his images but no ischemia.  His EF is 36%  Echo 07/2012: EF ~30%.  Grade 2 diastolic dysfunction.  Mod MR.  Mild to mod dilated LA.  Mildly dilated RV with mildly reduced systolic function.    He returns for follow up today.  Feels great.  Rides [employer] for about 50 mins at a time almost every morning (13.5 miles).  He plays golf when he can.  No CP or dyspnea. Compliant with all his med.  Has follow up with Dr. Drue Novel in April and will get repeat lipid panel.  SBP 110-115 at home.    Lab Results  Component Value Date   CHOL 149 04/09/2012   HDL 55.00 04/09/2012   LDLCALC 81 04/09/2012   TRIG 67.0 04/09/2012   CHOLHDL 3 04/09/2012    No results found for this basename: LDLDIRECT    Past Medical History  Diagnosis Date  . CAD (coronary artery disease)     a- S/p cabg in march 2006 by Dr.Owen;  b. myoview 5/12: large IL scar from apex to base, no ischemia, EF 37%  . Ischemic cardiomyopathy     a-Echocardiogram, Nov 2006, showed ejection fraction of 30-40% with mild to moderated mitral  regurgitation and mild aortic regurgitation b- Cardiac MRI, May 2007, EF of 44% with 50% scar involving  the inferolateral walls. No comment of mitral regurgitation. c- last echo  11/10: EF 30-35%  mod MR d- Nega T-Wave alternans testing in May of 2007 e- no ACE due to low BP;  f. CPX 3/11: normal  . Gout   . Hyperlipidemia   .  DM (diabetes mellitus)   . ED (erectile dysfunction)   . Bradycardia     Use of beta blocker limited  . 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 Systolic CHF, chronic   . AICD (automatic cardioverter/defibrillator) present 6/09    St. Jude    Current Outpatient Prescriptions  Medication Sig Dispense Refill  . aspirin 81 MG tablet Take 81 mg by mouth daily.        . candesartan (ATACAND) 4 MG tablet Take 1 tablet (4 mg total) by mouth daily.  90 tablet  3  . cetirizine (ZYRTEC) 10 MG tablet Take 10 mg by mouth daily.        . Ciclopirox 1 % shampoo 4 (four) times a week.      . Fish Oil OIL Take 3,000 mg by mouth daily.       . fluocinonide (LIDEX) 0.05 % external solution daily.      . metoprolol succinate (TOPROL-XL) 25 MG 24 hr tablet 1/2 tab two times a day.  90 tablet  3  . nitroGLYCERIN (NITROSTAT) 0.4 MG SL tablet Place 0.4 mg under the tongue every 5 (five) minutes as needed.      . simvastatin (ZOCOR) 40 MG tablet Take 1  tablet (40 mg total) by mouth at bedtime.  90 tablet  3   No current facility-administered medications for this encounter.    Allergies  Allergen Reactions  . Atorvastatin     REACTION: myalgia  . Sulfonamide Derivatives    Vital Signs: BP 116/64  Pulse 62  Wt 195 lb 4 oz (88.565 kg)  BMI 28.82 kg/m2  SpO2 99%  PHYSICAL EXAM: Well nourished, well developed, in no acute distress HEENT: normal Neck: no JVD Cardiac:  normal S1, S2; RRR; no murmur Lungs:  clear to auscultation bilaterally, no wheezing, rhonchi or rales Abd: soft, nontender, no hepatomegaly Ext: no edema Skin: warm and dry Neuro:  CNs 2-12 intact, no focal abnormalities noted Psych: Normal affect    ASSESSMENT AND PLAN:

## 2013-02-10 NOTE — Assessment & Plan Note (Signed)
Attending: Followed by Dr. Ladona Ridgel.

## 2013-02-10 NOTE — Assessment & Plan Note (Signed)
No evidence of ischemia. Continue current regimen.   

## 2013-02-10 NOTE — Patient Instructions (Addendum)
We will contact you in 6 months to schedule your next appointment.  

## 2013-02-11 NOTE — Addendum Note (Signed)
Encounter addended by: Merlene Pulling, CCT on: 02/11/2013  9:06 AM<BR>     Documentation filed: Charges VN

## 2013-04-11 ENCOUNTER — Ambulatory Visit (INDEPENDENT_AMBULATORY_CARE_PROVIDER_SITE_OTHER): Payer: BC Managed Care – PPO | Admitting: Internal Medicine

## 2013-04-11 ENCOUNTER — Encounter: Payer: Self-pay | Admitting: Internal Medicine

## 2013-04-11 VITALS — BP 108/70 | HR 59 | Temp 98.0°F | Ht 69.5 in | Wt 189.0 lb

## 2013-04-11 DIAGNOSIS — Z0000 Encounter for general adult medical examination without abnormal findings: Secondary | ICD-10-CM

## 2013-04-11 DIAGNOSIS — R7309 Other abnormal glucose: Secondary | ICD-10-CM

## 2013-04-11 DIAGNOSIS — L219 Seborrheic dermatitis, unspecified: Secondary | ICD-10-CM

## 2013-04-11 DIAGNOSIS — R7303 Prediabetes: Secondary | ICD-10-CM

## 2013-04-11 DIAGNOSIS — Z23 Encounter for immunization: Secondary | ICD-10-CM

## 2013-04-11 LAB — CBC WITH DIFFERENTIAL/PLATELET
Basophils Absolute: 0.1 10*3/uL (ref 0.0–0.1)
Eosinophils Absolute: 0.3 10*3/uL (ref 0.0–0.7)
Hemoglobin: 14.5 g/dL (ref 13.0–17.0)
Lymphocytes Relative: 28.2 % (ref 12.0–46.0)
Lymphs Abs: 2.1 10*3/uL (ref 0.7–4.0)
MCHC: 34.2 g/dL (ref 30.0–36.0)
Neutro Abs: 4 10*3/uL (ref 1.4–7.7)
RDW: 13 % (ref 11.5–14.6)

## 2013-04-11 LAB — COMPREHENSIVE METABOLIC PANEL
ALT: 18 U/L (ref 0–53)
AST: 17 U/L (ref 0–37)
Calcium: 9.1 mg/dL (ref 8.4–10.5)
Chloride: 104 mEq/L (ref 96–112)
Creatinine, Ser: 0.9 mg/dL (ref 0.4–1.5)
Potassium: 4.5 mEq/L (ref 3.5–5.1)
Sodium: 137 mEq/L (ref 135–145)

## 2013-04-11 LAB — TSH: TSH: 1.01 u[IU]/mL (ref 0.35–5.50)

## 2013-04-11 LAB — LIPID PANEL: HDL: 45.1 mg/dL (ref >39.00)

## 2013-04-11 LAB — HEMOGLOBIN A1C: Hgb A1c MFr Bld: 6.2 % (ref 4.6–6.5)

## 2013-04-11 LAB — PSA: PSA: 0.44 ng/mL (ref 0.10–4.00)

## 2013-04-11 MED ORDER — NITROGLYCERIN 0.4 MG SL SUBL: 0.4000 mg | SUBLINGUAL_TABLET | SUBLINGUAL | Status: DC | PRN

## 2013-04-11 MED ORDER — SALICYLIC ACID-SULFUR 2-2 % EX SHAM: MEDICATED_SHAMPOO | Freq: Every day | CUTANEOUS | Status: DC | PRN

## 2013-04-11 MED ORDER — KETOCONAZOLE 2 % EX SHAM: 1.0000 "application " | MEDICATED_SHAMPOO | CUTANEOUS | Status: DC

## 2013-04-11 NOTE — Assessment & Plan Note (Addendum)
Sees dermatology but request a RF on Nizoral and sebalex-- done

## 2013-04-11 NOTE — Assessment & Plan Note (Signed)
F/u in 6 months. 

## 2013-04-11 NOTE — Assessment & Plan Note (Addendum)
Td 2008 pneumonia shot 2009 zostavax today 01-2008: Cscope, tics, no polyps, next in 10 years doing well, cont.  with healthy lifestyle  labs including a PSA. After this year consider decrease prostate ca screening to every other year CV disease , saqw cards recently, seems to be doing great

## 2013-04-11 NOTE — Progress Notes (Signed)
Subjective:    Patient ID: Matthew Joyce, male    DOB: [birth year], 62 y.o.   MRN: [id number]  HPI CPX  Past Medical History  Diagnosis Date  . CAD (coronary artery disease)     a- S/p cabg in march 2006 by Dr.Owen;  b. myoview 5/12: large IL scar from apex to base, no ischemia, EF 37%  . Ischemic cardiomyopathy     a-Echocardiogram, Nov 2006, showed ejection fraction of 30-40% with mild to moderated mitral  regurgitation and mild aortic regurgitation b- Cardiac MRI, May 2007, EF of 44% with 50% scar involving  the inferolateral walls. No comment of mitral regurgitation. c- last echo  11/10: EF 30-35%  mod MR d- Nega T-Wave alternans testing in May of 2007 e- no ACE due to low BP;  f. CPX 3/11: normal  . Gout   . Hyperlipidemia   . DM (diabetes mellitus)   . ED (erectile dysfunction)   . Bradycardia     Use of beta blocker limited  . 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 Systolic CHF, chronic   . AICD (automatic cardioverter/defibrillator) present 6/09    St. Jude   Past Surgical History  Procedure Laterality Date  . Tonsillectomy      as a child  . Coronary artery bypass graft  02-2005  . Orif ankle fracture  1987    left   . Shoulder arthroscopy  02/2010    rt   . Cardiac defibrillator placement  06/01/08    ICD   History   Social History  . Marital Status: Married    Spouse Name: N/A    Number of Children: 2  . Years of Education: N/A   Occupational History  . 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     Social History Main Topics  . Smoking status: Former Smoker -- 0.10 packs/day    Types: Cigarettes    Quit date: 12/16/1999  . Smokeless tobacco: Never Used  . Alcohol Use: 0.0 oz/week     Comment: socially  . Drug Use: No  . Sexually Active: Not on file   Other Topics Concern  . Not on file   Social History Narrative   Exercise-- stationary bike 50 min most days, ~ 14 miles     Diet-- ok   Family History  Problem Relation Age of Onset  . Heart attack Father 31  . Cardiomyopathy Brother   . Colon cancer Neg Hx   . Prostate cancer Neg Hx   . Diabetes Neg Hx      Review of Systems  Respiratory: Negative for cough and shortness of breath.   Cardiovascular: Negative for chest pain and leg swelling.  Gastrointestinal: Negative for abdominal pain and blood in stool.  Genitourinary: Negative for hematuria and difficulty urinating.  Psychiatric/Behavioral:       No anxiety-depression       Objective:   Physical Exam  BP 108/70  Pulse 59  Temp(Src) 98 F (36.7 C) (Oral)  Ht 5' 9.5" (1.765 m)  Wt 189 lb (85.73 kg)  BMI 27.52 kg/m2  SpO2 96%  General -- alert, well-developed, NAD Neck --no thyromegaly , normal carotid pulse Lungs -- normal respiratory effort, no intercostal retractions, no accessory muscle use, and normal breath sounds.   Heart-- normal rate, regular rhythm, no murmur, and no gallop.   Abdomen--soft, non-tender, no distention, no masses, no HSM, no guarding,  and no rigidity.   Extremities-- no pretibial edema bilaterally Rectal-- + external hemorrhoid noted. Normal sphincter tone. No rectal masses or tenderness. Brown stool  Prostate:  Prostate gland firm and smooth, no enlargement, nodularity, tenderness, mass, asymmetry or induration. Neurologic-- alert & oriented X3 and strength normal in all extremities. Psych-- Cognition and judgment appear intact. Alert and cooperative with normal attention span and concentration.  not anxious appearing and not depressed appearing.       Assessment & Plan:

## 2013-04-18 ENCOUNTER — Ambulatory Visit (INDEPENDENT_AMBULATORY_CARE_PROVIDER_SITE_OTHER): Payer: BC Managed Care – PPO | Admitting: *Deleted

## 2013-04-18 ENCOUNTER — Encounter: Payer: Self-pay | Admitting: Internal Medicine

## 2013-04-18 ENCOUNTER — Other Ambulatory Visit: Payer: Self-pay | Admitting: Internal Medicine

## 2013-04-18 DIAGNOSIS — Z95810 Presence of automatic (implantable) cardiac defibrillator: Secondary | ICD-10-CM

## 2013-04-18 DIAGNOSIS — I5022 Chronic systolic (congestive) heart failure: Secondary | ICD-10-CM

## 2013-04-18 LAB — REMOTE ICD DEVICE
BATTERY VOLTAGE: 2.77 V
BRDY-0002RV: 40 {beats}/min
DEVICE MODEL ICD: 550952
RV LEAD AMPLITUDE: 7.2 mv
TZAT-0001SLOWVT: 1
TZAT-0004FASTVT: 8
TZAT-0004SLOWVT: 8
TZAT-0012FASTVT: 200 ms
TZAT-0012SLOWVT: 200 ms
TZAT-0013SLOWVT: 4
TZAT-0018FASTVT: NEGATIVE
TZAT-0019FASTVT: 7.5 V
TZAT-0020SLOWVT: 1 ms
TZON-0003FASTVT: 280 ms
TZON-0004FASTVT: 16
TZON-0005SLOWVT: 6
TZST-0001FASTVT: 3
TZST-0001FASTVT: 4
TZST-0001FASTVT: 5
TZST-0001SLOWVT: 3
TZST-0001SLOWVT: 5
TZST-0003FASTVT: 25 J
TZST-0003FASTVT: 36 J
TZST-0003SLOWVT: 15 J
TZST-0003SLOWVT: 25 J
TZST-0003SLOWVT: 36 J
VENTRICULAR PACING ICD: 1 pct

## 2013-05-02 ENCOUNTER — Encounter: Payer: Self-pay | Admitting: Internal Medicine

## 2013-05-05 ENCOUNTER — Encounter: Payer: Self-pay | Admitting: *Deleted

## 2013-07-18 ENCOUNTER — Encounter: Payer: BC Managed Care – PPO | Admitting: *Deleted

## 2013-07-19 ENCOUNTER — Encounter: Payer: Self-pay | Admitting: *Deleted

## 2013-07-21 ENCOUNTER — Encounter: Payer: Self-pay | Admitting: Internal Medicine

## 2013-07-25 ENCOUNTER — Encounter: Payer: Self-pay | Admitting: Internal Medicine

## 2013-08-11 ENCOUNTER — Encounter (HOSPITAL_COMMUNITY): Payer: BC Managed Care – PPO

## 2013-08-18 ENCOUNTER — Ambulatory Visit (HOSPITAL_COMMUNITY)
Admission: RE | Admit: 2013-08-18 | Discharge: 2013-08-18 | Disposition: A | Discharge status: Home / Self Care | Payer: BC Managed Care – PPO | Source: Ambulatory Visit | Attending: Internal Medicine | Admitting: Internal Medicine

## 2013-08-18 VITALS — BP 120/72 | HR 53 | Wt 189.0 lb

## 2013-08-18 DIAGNOSIS — I5022 Chronic systolic (congestive) heart failure: Secondary | ICD-10-CM

## 2013-08-18 DIAGNOSIS — I2510 Atherosclerotic heart disease of native coronary artery without angina pectoris: Secondary | ICD-10-CM

## 2013-08-18 DIAGNOSIS — Z79899 Other long term (current) drug therapy: Secondary | ICD-10-CM | POA: Insufficient documentation

## 2013-08-18 DIAGNOSIS — Z951 Presence of aortocoronary bypass graft: Secondary | ICD-10-CM | POA: Insufficient documentation

## 2013-08-18 DIAGNOSIS — Z95810 Presence of automatic (implantable) cardiac defibrillator: Secondary | ICD-10-CM | POA: Insufficient documentation

## 2013-08-18 DIAGNOSIS — Z7982 Long term (current) use of aspirin: Secondary | ICD-10-CM | POA: Insufficient documentation

## 2013-08-18 DIAGNOSIS — E785 Hyperlipidemia, unspecified: Secondary | ICD-10-CM

## 2013-08-18 DIAGNOSIS — M109 Gout, unspecified: Secondary | ICD-10-CM | POA: Insufficient documentation

## 2013-08-18 DIAGNOSIS — I4510 Unspecified right bundle-branch block: Secondary | ICD-10-CM

## 2013-08-18 DIAGNOSIS — I498 Other specified cardiac arrhythmias: Secondary | ICD-10-CM | POA: Insufficient documentation

## 2013-08-18 DIAGNOSIS — I2589 Other forms of chronic ischemic heart disease: Secondary | ICD-10-CM | POA: Insufficient documentation

## 2013-08-18 MED ORDER — CANDESARTAN CILEXETIL 4 MG PO TABS: 4.0000 mg | ORAL_TABLET | Freq: Every day | ORAL | Status: DC

## 2013-08-18 MED ORDER — METOPROLOL SUCCINATE ER 25 MG PO TB24: ORAL_TABLET | ORAL | Status: DC

## 2013-08-18 MED ORDER — SIMVASTATIN 40 MG PO TABS: 40.0000 mg | ORAL_TABLET | Freq: Every day | ORAL | Status: DC

## 2013-08-18 NOTE — Progress Notes (Signed)
Patient ID: Matthew Joyce, male   DOB: 1951/08/27, 62 y.o.   MRN: [id number] History of Present Illness: Primary Cardiologist:  [name] Primary Electrophysiologist:  Dr. Lewayne Kerney PCP; Dr Asa Saunas is a 62 y.o. male with a history of a coronary artery disease status post bypass surgery in 2006.  He also had mod LV dysfunction. EF 30-35% He is s/p ICD.  There is also history of ischemic mitral regurgitation.  Remainder of medical history is for diabetes, hyperlipidemia and bradycardia. Ace-I previously stopped due to hypotension.  B-blocker dosing limited by bradycardia. Had Myoview 5/12. He walked for over 11:00 and had no chest pain.  He had an old scar on his images but no ischemia.  His EF is 36%  Echo 07/2012: EF ~30%.  Grade 2 diastolic dysfunction.  Mod MR.  Mild to mod dilated LA.  Mildly dilated RV with mildly reduced systolic function.    He returns for follow up today.  Denies SOB/PND/Orthopnea/CP. Feels great.  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 He plays golf at least 5-6 days a week. Compliant with all his med.    PCP follow up in October for lipid panel.   Lab Results  Component Value Date   CHOL 149 04/11/2013   HDL 45.10 04/11/2013   LDLCALC 89 04/11/2013   TRIG 77.0 04/11/2013   CHOLHDL 3 04/11/2013    No results found for this basename: LDLDIRECT    Past Medical History  Diagnosis Date  . CAD (coronary artery disease)     a- S/p cabg in march 2006 by Dr.Owen;  b. myoview 5/12: large IL scar from apex to base, no ischemia, EF 37%  . Ischemic cardiomyopathy     a-Echocardiogram, Nov 2006, showed ejection fraction of 30-40% with mild to moderated mitral  regurgitation and mild aortic regurgitation b- Cardiac MRI, May 2007, EF of 44% with 50% scar involving  the inferolateral walls. No comment of mitral regurgitation. c- last echo  11/10: EF 30-35%  mod MR d- Nega T-Wave alternans testing in May of 2007 e- no ACE due to low BP;  f. CPX 3/11: normal   . Gout   . Hyperlipidemia   . DM (diabetes mellitus)   . ED (erectile dysfunction)   . Bradycardia     Use of beta blocker limited  . 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 Systolic CHF, chronic   . AICD (automatic cardioverter/defibrillator) present 6/09    St. Jude    Current Outpatient Prescriptions  Medication Sig Dispense Refill  . aspirin 81 MG tablet Take 81 mg by mouth daily.        . candesartan (ATACAND) 4 MG tablet Take 1 tablet (4 mg total) by mouth daily.  90 tablet  3  . cetirizine (ZYRTEC) 10 MG tablet Take 10 mg by mouth daily.        . Ciclopirox 1 % shampoo 4 (four) times a week.      . Fish Oil OIL Take 3,000 mg by mouth daily.       . fluocinonide (LIDEX) 0.05 % external solution daily.      Marland Kitchen ketoconazole (NIZORAL) 2 % shampoo Apply 1 application topically 2 (two) times a week.  120 mL  1  . metoprolol succinate (TOPROL-XL) 25 MG 24 hr tablet 1/2 tab two times a day.  90 tablet  3  . salicyclic acid-sulfur (SEBULEX) 2-2 % shampoo Apply  topically daily as needed for itching.  118 mL  1  . simvastatin (ZOCOR) 40 MG tablet Take 1 tablet (40 mg total) by mouth at bedtime.  90 tablet  3  . nitroGLYCERIN (NITROSTAT) 0.4 MG SL tablet Place 1 tablet (0.4 mg total) under the tongue every 5 (five) minutes as needed.  30 tablet  2   No current facility-administered medications for this encounter.    Allergies  Allergen Reactions  . Atorvastatin     REACTION: myalgia  . Sulfonamide Derivatives    Vital Signs: BP 120/72  Pulse 53  Wt 189 lb (85.73 kg)  BMI 27.52 kg/m2  SpO2 99%  PHYSICAL EXAM: Well nourished, well developed, in no acute distress HEENT: normal Neck: no JVD Cardiac:  normal S1, S2; RRR; no murmur Lungs:  clear to auscultation bilaterally, no wheezing, rhonchi or rales Abd: soft, nontender, no hepatomegaly Ext: no edema Skin: warm and dry Neuro:  CNs 2-12 intact, no focal abnormalities noted Psych: Normal  affect     ASSESSMENT AND PLAN:   1. Chronic Systolic Heart Failure ECHO 07/2012 EF ~30% Has St Jude ICD 2009 NYHA I. Volume status stable. Continue TOPROL XL 12.5 mg twice a day. Will not up titrate due to bradycardia noted with increase. Continue Atacand 4 mg daily.  Will need repeat ECHO    2. CAD S/P CABG 2006 No evidence of ischemia. Continue aspirin and simvastatin  3. Hyperlipidemia Continue simvastatin 40 mg at bed time. Cholesterol followed by PCP  4. RBBB  Follow up in 6 months.   CLEGG,AMY NPC- 12:51 PM

## 2013-08-18 NOTE — Patient Instructions (Addendum)
Follow up in 6 months  Schedule an ECHO  Do the following things EVERYDAY: 1) Weigh yourself in the morning before breakfast. Write it down and keep it in a log. 2) Take your medicines as prescribed 3) Eat low salt foods-Limit salt (sodium) to 2000 mg per day.  4) Stay as active as you can everyday 5) Limit all fluids for the day to less than 2 liters

## 2013-08-19 NOTE — Addendum Note (Signed)
Encounter addended by: Aniken Monestime C Mico Spark, CCT on: 08/19/2013  8:40 AM<BR>     Documentation filed: Charges VN

## 2013-08-25 ENCOUNTER — Ambulatory Visit (HOSPITAL_COMMUNITY)
Admission: RE | Admit: 2013-08-25 | Discharge: 2013-08-25 | Disposition: A | Discharge status: Home / Self Care | Payer: BC Managed Care – PPO | Source: Ambulatory Visit | Attending: Internal Medicine | Admitting: Internal Medicine

## 2013-08-25 DIAGNOSIS — I5022 Chronic systolic (congestive) heart failure: Secondary | ICD-10-CM

## 2013-08-25 DIAGNOSIS — Z950 Presence of cardiac pacemaker: Secondary | ICD-10-CM | POA: Insufficient documentation

## 2013-08-25 DIAGNOSIS — I509 Heart failure, unspecified: Secondary | ICD-10-CM | POA: Insufficient documentation

## 2013-08-25 DIAGNOSIS — I059 Rheumatic mitral valve disease, unspecified: Secondary | ICD-10-CM

## 2013-08-25 NOTE — Progress Notes (Signed)
Echocardiogram 2D Echocardiogram has been performed.  Shrita Thien 08/25/2013, 2:26 PM

## 2013-08-26 ENCOUNTER — Telehealth (HOSPITAL_COMMUNITY): Payer: Self-pay | Admitting: Adult Health

## 2013-08-26 NOTE — Telephone Encounter (Signed)
Pt aware.

## 2013-08-26 NOTE — Telephone Encounter (Signed)
Left message to return phone call regarding ECHO results.  EF improved to 45-50%  Continue current plan   CLEGG,AMY 10:38 AM

## 2013-09-26 ENCOUNTER — Encounter: Payer: BC Managed Care – PPO | Admitting: Internal Medicine

## 2013-09-29 ENCOUNTER — Encounter: Payer: Self-pay | Admitting: Internal Medicine

## 2013-09-29 ENCOUNTER — Ambulatory Visit (INDEPENDENT_AMBULATORY_CARE_PROVIDER_SITE_OTHER): Payer: BC Managed Care – PPO | Admitting: Internal Medicine

## 2013-09-29 VITALS — BP 108/69 | HR 56 | Ht 69.5 in | Wt 194.4 lb

## 2013-09-29 DIAGNOSIS — I2510 Atherosclerotic heart disease of native coronary artery without angina pectoris: Secondary | ICD-10-CM

## 2013-09-29 DIAGNOSIS — Z95810 Presence of automatic (implantable) cardiac defibrillator: Secondary | ICD-10-CM

## 2013-09-29 DIAGNOSIS — I5022 Chronic systolic (congestive) heart failure: Secondary | ICD-10-CM

## 2013-09-29 LAB — ICD DEVICE OBSERVATION
BMOD-0002RV: 8
BRDY-0002RV: 40 {beats}/min
BRDY-0004RV: 110 {beats}/min
FVT: 0
HV IMPEDENCE: 49 Ohm
RV LEAD AMPLITUDE: 7.6 mv
RV LEAD IMPEDENCE ICD: 337.5 Ohm
RV LEAD THRESHOLD: 1 V
TOT-0007: 1
TOT-0010: 26
TZAT-0001FASTVT: 1
TZAT-0001SLOWVT: 1
TZAT-0004FASTVT: 8
TZAT-0004SLOWVT: 8
TZAT-0012FASTVT: 200 ms
TZAT-0013FASTVT: 2
TZAT-0019SLOWVT: 7.5 V
TZON-0004FASTVT: 30
TZON-0004SLOWVT: 30
TZON-0005FASTVT: 6
TZON-0010SLOWVT: 80 ms
TZST-0001FASTVT: 3
TZST-0001FASTVT: 5
TZST-0001SLOWVT: 3
TZST-0003FASTVT: 25 J
TZST-0003FASTVT: 36 J
TZST-0003FASTVT: 36 J
TZST-0003SLOWVT: 15 J
TZST-0003SLOWVT: 36 J
VF: 0

## 2013-09-29 NOTE — Patient Instructions (Signed)
Remote monitoring is used to monitor your Pacemaker of ICD from home. This monitoring reduces the number of office visits required to check your device to one time per year. It allows [place] to keep an eye on the functioning of your device to ensure it is working properly. You are scheduled for a device check from home on 12/30/2013. You may send your transmission at any time that day. If you have a wireless device, the transmission will be sent automatically. After your physician reviews your transmission, you will receive a postcard with your next transmission date.  Your physician wants you to follow-up in: one year with Dr. Ladona Ridgel.  You will receive a reminder letter in the mail two months in advance. If you don't receive a letter, please call our office to schedule the follow-up appointment.

## 2013-10-02 ENCOUNTER — Encounter: Payer: Self-pay | Admitting: Internal Medicine

## 2013-10-02 NOTE — Assessment & Plan Note (Signed)
His St. Jude ICD is working normally. We'll plan to recheck in several months. 

## 2013-10-02 NOTE — Assessment & Plan Note (Signed)
hhis chronic systolic heart failure is well compensated class IIA. He'll continue his current medical therapy, and maintain a low-sodium diet.

## 2013-10-02 NOTE — Progress Notes (Signed)
HPI Mr. Darko returns today for followup. He is a pleasant middle aged man with a h/o ICM, chronic class 1-2 CHF, and HTN. He is s/p ICD implant. In the interim, he has done well. He denies chest pain, shortness of breath, peripheral edema, or syncope. No ICD shocks. In the interim he is retired from work, and his mental outlook is much improved. Allergies  Allergen Reactions  . Atorvastatin     REACTION: myalgia  . Sulfonamide Derivatives      Current Outpatient Prescriptions  Medication Sig Dispense Refill  . aspirin 81 MG tablet Take 81 mg by mouth daily.        . candesartan (ATACAND) 4 MG tablet Take 1 tablet (4 mg total) by mouth daily.  90 tablet  3  . cetirizine (ZYRTEC) 10 MG tablet Take 10 mg by mouth daily.        . Ciclopirox 1 % shampoo 4 (four) times a week.      . Fish Oil OIL Take 3,000 mg by mouth daily.       . fluocinonide (LIDEX) 0.05 % external solution daily.      Marland Kitchen ketoconazole (NIZORAL) 2 % shampoo Apply 1 application topically 2 (two) times a week.  120 mL  1  . metoprolol succinate (TOPROL-XL) 25 MG 24 hr tablet 1/2 tab two times a day.  90 tablet  3  . nitroGLYCERIN (NITROSTAT) 0.4 MG SL tablet Place 1 tablet (0.4 mg total) under the tongue every 5 (five) minutes as needed.  30 tablet  2  . salicyclic acid-sulfur (SEBULEX) 2-2 % shampoo Apply topically daily as needed for itching.  118 mL  1  . simvastatin (ZOCOR) 40 MG tablet Take 1 tablet (40 mg total) by mouth at bedtime.  90 tablet  3   No current facility-administered medications for this visit.     Past Medical History  Diagnosis Date  . CAD (coronary artery disease)     a- S/p cabg in march 2006 by Dr.Owen;  b. myoview 5/12: large IL scar from apex to base, no ischemia, EF 37%  . Ischemic cardiomyopathy     a-Echocardiogram, Nov 2006, showed ejection fraction of 30-40% with mild to moderated mitral  regurgitation and mild aortic regurgitation b- Cardiac MRI, May 2007, EF of 44% with 50% scar  involving  the inferolateral walls. No comment of mitral regurgitation. c- last echo  11/10: EF 30-35%  mod MR d- Nega T-Wave alternans testing in May of 2007 e- no ACE due to low BP;  f. CPX 3/11: normal  . Gout   . Hyperlipidemia   . DM (diabetes mellitus)   . ED (erectile dysfunction)   . Bradycardia     Use of beta blocker limited  . 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 Systolic CHF, chronic   . AICD (automatic cardioverter/defibrillator) present 6/09    St. Jude    ROS:   All systems reviewed and negative except as noted in the HPI.   Past Surgical History  Procedure Laterality Date  . Tonsillectomy      as a child  . Coronary artery bypass graft  02-2005  . Orif ankle fracture  1987    left   . Shoulder arthroscopy  02/2010    rt   . Cardiac defibrillator placement  06/01/08    ICD     Family History  Problem Relation Age of Onset  . Heart attack Father  70  . Cardiomyopathy Brother   . Colon cancer Neg Hx   . Prostate cancer Neg Hx   . Diabetes Neg Hx      History   Social History  . Marital Status: Married    Spouse Name: N/A    Number of Children: 2  . Years of Education: N/A   Occupational History  . 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     Social History Main Topics  . Smoking status: Former Smoker -- 0.10 packs/day    Types: Cigarettes    Quit date: 12/16/1999  . Smokeless tobacco: Never Used  . Alcohol Use: 0.0 oz/week     Comment: socially  . Drug Use: No  . Sexual Activity: Not on file   Other Topics Concern  . Not on file   Social History Narrative   Exercise-- stationary bike 50 min most days, ~ 14 miles    Diet-- ok     BP 108/69  Pulse 56  Ht 5' 9.5" (1.765 m)  Wt 194 lb 6.4 oz (88.179 kg)  BMI 28.31 kg/m2  Physical Exam:  Well appearing middle-aged man, NAD HEENT: Unremarkable Neck:  6 cm JVD, no thyromegally Lungs:  Clear with no  wheezes, rales, or rhonchi. Well-healed ICD incision. HEART:  Regular rate rhythm, no murmurs, no rubs, no clicks Abd:  soft, positive bowel sounds, no organomegally, no rebound, no guarding Ext:  2 plus pulses, no edema, no cyanosis, no clubbing Skin:  No rashes no nodules Neuro:  CN II through XII intact, motor grossly intact  DEVICE  Normal device function.  See PaceArt for details.   Assess/Plan:

## 2013-10-13 ENCOUNTER — Ambulatory Visit (INDEPENDENT_AMBULATORY_CARE_PROVIDER_SITE_OTHER): Payer: BC Managed Care – PPO | Admitting: Internal Medicine

## 2013-10-13 ENCOUNTER — Encounter: Payer: Self-pay | Admitting: Internal Medicine

## 2013-10-13 VITALS — BP 112/72 | HR 75 | Temp 97.9°F | Wt 194.0 lb

## 2013-10-13 DIAGNOSIS — R7303 Prediabetes: Secondary | ICD-10-CM

## 2013-10-13 DIAGNOSIS — I509 Heart failure, unspecified: Secondary | ICD-10-CM

## 2013-10-13 DIAGNOSIS — Z23 Encounter for immunization: Secondary | ICD-10-CM

## 2013-10-13 DIAGNOSIS — R7309 Other abnormal glucose: Secondary | ICD-10-CM

## 2013-10-13 LAB — BASIC METABOLIC PANEL
BUN: 16 mg/dL (ref 6–23)
Chloride: 102 mEq/L (ref 96–112)
GFR: 113.45 mL/min (ref >60.00)
Glucose, Bld: 120 mg/dL — ABNORMAL HIGH (ref 70–99)
Potassium: 4.6 mEq/L (ref 3.5–5.1)

## 2013-10-13 NOTE — Progress Notes (Signed)
  Subjective:    Patient ID: Matthew Joyce, male    DOB: 1951/01/30, 62 y.o.   MRN: [id number]  HPI 6 months followup. Feeling very well, he remains active. Meds list reviewed, good compliance without apparent side effects  Past Medical History  Diagnosis Date  . CAD (coronary artery disease)     a- S/p cabg in march 2006 by Dr.Owen;  b. myoview 5/12: large IL scar from apex to base, no ischemia, EF 37%  . Ischemic cardiomyopathy     a-Echocardiogram, Nov 2006, showed ejection fraction of 30-40% with mild to moderated mitral  regurgitation and mild aortic regurgitation b- Cardiac MRI, May 2007, EF of 44% with 50% scar involving  the inferolateral walls. No comment of mitral regurgitation. c- last echo  11/10: EF 30-35%  mod MR d- Nega T-Wave alternans testing in May of 2007 e- no ACE due to low BP;  f. CPX 3/11: normal  . Gout   . Hyperlipidemia   . DM (diabetes mellitus)   . ED (erectile dysfunction)   . Bradycardia     Use of beta blocker limited  . 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 Systolic CHF, chronic   . AICD (automatic cardioverter/defibrillator) present 6/09    St. Jude   Past Surgical History  Procedure Laterality Date  . Tonsillectomy      as a child  . Coronary artery bypass graft  02-2005  . Orif ankle fracture  1987    left   . Shoulder arthroscopy  02/2010    rt   . Cardiac defibrillator placement  06/01/08    ICD    Review of Systems Denies chest pain or shortness or breath. No lower extremity edema. No  nausea, vomiting, diarrhea. Remains very active and eats healthy     Objective:   Physical Exam BP 112/72  Pulse 75  Temp(Src) 97.9 F (36.6 C)  Wt 194 lb (87.998 kg)  BMI 28.25 kg/m2  SpO2 95% General -- alert, well-developed, NAD.  Lungs -- normal respiratory effort, no intercostal retractions, no accessory muscle use, and normal breath sounds.  Heart-- normal rate, regular rhythm, no murmur.   Extremities--  no pretibial edema bilaterally  Neurologic--  alert & oriented X3. Speech normal, gait normal, strength normal in all extremities.  Psych-- Cognition and judgment appear intact. Cooperative with normal attention span and concentration. No anxious appearing , no depressed appearing.      Assessment & Plan:

## 2013-10-13 NOTE — Assessment & Plan Note (Signed)
Seems to be doing great, check a BMP, continue with present care

## 2013-10-13 NOTE — Assessment & Plan Note (Signed)
A1c is stable over time

## 2013-10-13 NOTE — Patient Instructions (Signed)
Get your blood work before you leave  Next visit in 6 months for a physical exam . Fasting Please make an appointment    

## 2013-10-14 ENCOUNTER — Encounter: Payer: Self-pay | Admitting: Internal Medicine

## 2013-12-30 ENCOUNTER — Encounter: Payer: Self-pay | Admitting: Internal Medicine

## 2013-12-30 ENCOUNTER — Ambulatory Visit (INDEPENDENT_AMBULATORY_CARE_PROVIDER_SITE_OTHER): Payer: BC Managed Care – PPO | Admitting: *Deleted

## 2013-12-30 DIAGNOSIS — I5042 Chronic combined systolic (congestive) and diastolic (congestive) heart failure: Secondary | ICD-10-CM

## 2013-12-30 DIAGNOSIS — I428 Other cardiomyopathies: Secondary | ICD-10-CM

## 2014-01-02 LAB — MDC_IDC_ENUM_SESS_TYPE_REMOTE
Brady Statistic RV Percent Paced: 1 %
HIGH POWER IMPEDANCE MEASURED VALUE: 49 Ohm
Implantable Pulse Generator Serial Number: 550952
Lead Channel Pacing Threshold Amplitude: 1 V
Lead Channel Sensing Intrinsic Amplitude: 7.3 mV
Lead Channel Setting Pacing Pulse Width: 0.5 ms
MDC IDC MSMT BATTERY REMAINING LONGEVITY: 44 mo
MDC IDC MSMT BATTERY VOLTAGE: 2.65 V
MDC IDC MSMT LEADCHNL RV IMPEDANCE VALUE: 310 Ohm
MDC IDC MSMT LEADCHNL RV PACING THRESHOLD PULSEWIDTH: 0.5 ms
MDC IDC SESS DTM: 20150116081538
MDC IDC SET LEADCHNL RV PACING AMPLITUDE: 2.5 V
MDC IDC SET LEADCHNL RV SENSING SENSITIVITY: 0.3 mV
MDC IDC SET ZONE DETECTION INTERVAL: 250 ms
MDC IDC SET ZONE DETECTION INTERVAL: 330 ms
Zone Setting Detection Interval: 280 ms

## 2014-01-10 ENCOUNTER — Encounter: Payer: Self-pay | Admitting: *Deleted

## 2014-03-28 ENCOUNTER — Encounter (HOSPITAL_COMMUNITY): Payer: Self-pay

## 2014-03-28 ENCOUNTER — Ambulatory Visit (HOSPITAL_COMMUNITY)
Admission: RE | Admit: 2014-03-28 | Discharge: 2014-03-28 | Disposition: A | Discharge status: Home / Self Care | Payer: BC Managed Care – PPO | Source: Ambulatory Visit | Attending: Internal Medicine | Admitting: Internal Medicine

## 2014-03-28 VITALS — BP 118/69 | HR 55 | Resp 18 | Wt 184.0 lb

## 2014-03-28 DIAGNOSIS — I4510 Unspecified right bundle-branch block: Secondary | ICD-10-CM | POA: Insufficient documentation

## 2014-03-28 DIAGNOSIS — I5022 Chronic systolic (congestive) heart failure: Secondary | ICD-10-CM | POA: Insufficient documentation

## 2014-03-28 DIAGNOSIS — I2510 Atherosclerotic heart disease of native coronary artery without angina pectoris: Secondary | ICD-10-CM

## 2014-03-28 DIAGNOSIS — E785 Hyperlipidemia, unspecified: Secondary | ICD-10-CM | POA: Insufficient documentation

## 2014-03-28 NOTE — Progress Notes (Signed)
Patient ID: Matthew Joyce, male   DOB: November 20, 1951, 63 y.o.   MRN: [id number] History of Present Illness: Primary Cardiologist:  Dr. Glori Bickers Primary Electrophysiologist:  Dr. Cristopher Peru PCP; Dr Clent Jacks is a 63 y.o. male with a history of a CAD S/P CABG in 2006, ICD, ischemic MR, DM, hyperlipidemia, and bradycardia.  B-blocker dosing limited by bradycardia. Had Myoview 5/12 with  an old scar on his images but no ischemia with EF of 36%.   He returns for follow up today.  Feels great. Denies SOB/PND/Orthopnea/CP. No shocks. Says he has lost 10 pounds over the last 3 months. 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  He plays golf at least 5-6 days a week. Weight at home 184 pounds. Compliant with all his medications.   Echo 07/2012: EF ~30%.  Grade 2 diastolic dysfunction.  Mod MR.  Mild to mod dilated LA.  Mildly dilated RV with mildly reduced systolic function.   ECHO 08/2013 EF 45-50% Grade II DD Moderate MR  Labs 10/13/13 K 4.6 Creatinine 0.7  Lab Results  Component Value Date   CHOL 149 04/11/2013   HDL 45.10 04/11/2013   LDLCALC 89 04/11/2013   TRIG 77.0 04/11/2013   CHOLHDL 3 04/11/2013    No results found for this basename: LDLDIRECT    Past Medical History  Diagnosis Date  . CAD (coronary artery disease)     a- S/p cabg in march 2006 by Dr.Owen;  b. myoview 5/12: large IL scar from apex to base, no ischemia, EF 37%  . Ischemic cardiomyopathy     a-Echocardiogram, Nov 2006, showed ejection fraction of 30-40% with mild to moderated mitral  regurgitation and mild aortic regurgitation b- Cardiac MRI, May 2007, EF of 44% with 50% scar involving  the inferolateral walls. No comment of mitral regurgitation. c- last echo  11/10: EF 30-35%  mod MR d- Nega T-Wave alternans testing in May of 2007 e- no ACE due to low BP;  f. CPX 3/11: normal  . Gout   . Hyperlipidemia   . DM (diabetes mellitus)   . ED (erectile dysfunction)   . Bradycardia     Use of  beta blocker limited  . 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 Systolic CHF, chronic   . AICD (automatic cardioverter/defibrillator) present 6/09    St. Jude    Current Outpatient Prescriptions  Medication Sig Dispense Refill  . aspirin 81 MG tablet Take 81 mg by mouth daily.        . candesartan (ATACAND) 4 MG tablet Take 1 tablet (4 mg total) by mouth daily.  90 tablet  3  . cetirizine (ZYRTEC) 10 MG tablet Take 10 mg by mouth daily.        . Ciclopirox 1 % shampoo 4 (four) times a week.      . Fish Oil OIL Take 3,000 mg by mouth daily.       . fluocinonide (LIDEX) 0.05 % external solution daily.      Marland Kitchen ketoconazole (NIZORAL) 2 % shampoo Apply 1 application topically 2 (two) times a week.  120 mL  1  . metoprolol succinate (TOPROL-XL) 25 MG 24 hr tablet 1/2 tab two times a day.  90 tablet  3  . nitroGLYCERIN (NITROSTAT) 0.4 MG SL tablet Place 1 tablet (0.4 mg total) under the tongue every 5 (five) minutes as needed.  30 tablet  2  .  salicyclic acid-sulfur (SEBULEX) 2-2 % shampoo Apply topically daily as needed for itching.  118 mL  1  . simvastatin (ZOCOR) 40 MG tablet Take 1 tablet (40 mg total) by mouth at bedtime.  90 tablet  3   No current facility-administered medications for this encounter.    Allergies  Allergen Reactions  . Atorvastatin     REACTION: myalgia  . Sulfonamide Derivatives    Vital Signs: BP 118/69  Pulse 55  Resp 18  Wt 184 lb (83.462 kg)  SpO2 96%  PHYSICAL EXAM: Well nourished, well developed, in no acute distress HEENT: normal Neck: no JVD Cardiac:  normal S1, S2; RRR; no murmur Lungs:  clear to auscultation bilaterally, no wheezing, rhonchi or rales Abd: soft, nontender, no hepatomegaly Ext: no edema Skin: warm and dry Neuro:  CNs 2-12 intact, no focal abnormalities noted Psych: Normal affect     ASSESSMENT AND PLAN:   1. Chronic Systolic Heart Failure ICM ECHO 08/2013 EF 45-50%  Has St Jude ICD  2009 NYHA I. Doing great!. Volume status stable. Continue TOPROL XL 12.5 mg twice a day.  Continue Atacand 4 mg daily.  2. CAD S/P CABG 2006 No evidence of ischemia. Continue aspirin and simvastatin 3. Hyperlipidemia Continue simvastatin 40 mg at bed time. Cholesterol followed by PCP 4. RBBB  Follow up in 12 months with an ECHO   Amy D Clegg NP-C 8:26 AM

## 2014-03-28 NOTE — Patient Instructions (Signed)
Follow up in 1 year with an ECHO 

## 2014-04-03 ENCOUNTER — Encounter: Payer: Self-pay | Admitting: Internal Medicine

## 2014-04-03 ENCOUNTER — Ambulatory Visit (INDEPENDENT_AMBULATORY_CARE_PROVIDER_SITE_OTHER): Payer: BC Managed Care – PPO | Admitting: *Deleted

## 2014-04-03 DIAGNOSIS — I5022 Chronic systolic (congestive) heart failure: Secondary | ICD-10-CM

## 2014-04-03 LAB — MDC_IDC_ENUM_SESS_TYPE_REMOTE
Battery Voltage: 2.62 V
Date Time Interrogation Session: 20150420064742
HighPow Impedance: 46 Ohm
Lead Channel Sensing Intrinsic Amplitude: 6.8 mV
MDC IDC MSMT BATTERY REMAINING LONGEVITY: 43 mo
MDC IDC MSMT LEADCHNL RV IMPEDANCE VALUE: 310 Ohm
MDC IDC MSMT LEADCHNL RV PACING THRESHOLD AMPLITUDE: 1 V
MDC IDC MSMT LEADCHNL RV PACING THRESHOLD PULSEWIDTH: 0.5 ms
MDC IDC PG SERIAL: 550952
MDC IDC SET LEADCHNL RV PACING AMPLITUDE: 2.5 V
MDC IDC SET LEADCHNL RV PACING PULSEWIDTH: 0.5 ms
MDC IDC SET LEADCHNL RV SENSING SENSITIVITY: 0.3 mV
MDC IDC SET ZONE DETECTION INTERVAL: 280 ms
MDC IDC STAT BRADY RV PERCENT PACED: 1 %
Zone Setting Detection Interval: 250 ms
Zone Setting Detection Interval: 330 ms

## 2014-04-12 ENCOUNTER — Telehealth: Payer: Self-pay

## 2014-04-12 NOTE — Telephone Encounter (Addendum)
Left message for call back Non identifiable  Medication List and allergies:  Reviewed and updated  90 day supply/mail order: [facility] Local prescriptions: [place]  Immunizations due: UTD  A/P:   No changes to FH, PSH or Personal Hx Flu vaccine--09/2013 Tdap--2008 PNA--2009 Shingles vaccine--03/2013 01-2008: Dr Siri Cole, tics, no polyps, next in 10 years PSA--03/2013--0.44  To Discuss with Provider: Not at this time

## 2014-04-12 NOTE — Telephone Encounter (Signed)
Pt called back for Matthew Joyce.  Please contact when you can.

## 2014-04-13 ENCOUNTER — Encounter: Payer: Self-pay | Admitting: Internal Medicine

## 2014-04-13 ENCOUNTER — Ambulatory Visit (INDEPENDENT_AMBULATORY_CARE_PROVIDER_SITE_OTHER): Payer: BC Managed Care – PPO | Admitting: Internal Medicine

## 2014-04-13 VITALS — BP 92/55 | HR 55 | Temp 98.0°F | Ht 69.0 in | Wt 186.0 lb

## 2014-04-13 DIAGNOSIS — Z23 Encounter for immunization: Secondary | ICD-10-CM

## 2014-04-13 DIAGNOSIS — R7303 Prediabetes: Secondary | ICD-10-CM

## 2014-04-13 DIAGNOSIS — R7309 Other abnormal glucose: Secondary | ICD-10-CM

## 2014-04-13 DIAGNOSIS — Z0000 Encounter for general adult medical examination without abnormal findings: Secondary | ICD-10-CM

## 2014-04-13 LAB — CBC WITH DIFFERENTIAL/PLATELET
BASOS ABS: 0.1 10*3/uL (ref 0.0–0.1)
Basophils Relative: 0.9 % (ref 0.0–3.0)
EOS ABS: 0.3 10*3/uL (ref 0.0–0.7)
Eosinophils Relative: 3.9 % (ref 0.0–5.0)
HEMATOCRIT: 43 % (ref 39.0–52.0)
HEMOGLOBIN: 14.2 g/dL (ref 13.0–17.0)
LYMPHS ABS: 1.7 10*3/uL (ref 0.7–4.0)
Lymphocytes Relative: 24.7 % (ref 12.0–46.0)
MCHC: 33 g/dL (ref 30.0–36.0)
MCV: 90.4 fl (ref 78.0–100.0)
MONO ABS: 0.8 10*3/uL (ref 0.1–1.0)
Monocytes Relative: 11.5 % (ref 3.0–12.0)
NEUTROS ABS: 4.1 10*3/uL (ref 1.4–7.7)
Neutrophils Relative %: 59 % (ref 43.0–77.0)
PLATELETS: 245 10*3/uL (ref 150.0–400.0)
RBC: 4.76 Mil/uL (ref 4.22–5.81)
RDW: 13.4 % (ref 11.5–14.6)
WBC: 6.9 10*3/uL (ref 4.5–10.5)

## 2014-04-13 LAB — LIPID PANEL
CHOLESTEROL: 147 mg/dL (ref 0–200)
HDL: 45.6 mg/dL (ref >39.00)
LDL Cholesterol: 86 mg/dL (ref 0–99)
Total CHOL/HDL Ratio: 3
Triglycerides: 76 mg/dL (ref 0.0–149.0)
VLDL: 15.2 mg/dL (ref 0.0–40.0)

## 2014-04-13 LAB — COMPREHENSIVE METABOLIC PANEL
ALT: 22 U/L (ref 0–53)
AST: 19 U/L (ref 0–37)
Albumin: 3.6 g/dL (ref 3.5–5.2)
Alkaline Phosphatase: 50 U/L (ref 39–117)
BILIRUBIN TOTAL: 1.5 mg/dL — AB (ref 0.3–1.2)
BUN: 20 mg/dL (ref 6–23)
CHLORIDE: 103 meq/L (ref 96–112)
CO2: 28 mEq/L (ref 19–32)
CREATININE: 0.8 mg/dL (ref 0.4–1.5)
Calcium: 9.2 mg/dL (ref 8.4–10.5)
GFR: 105.04 mL/min (ref >60.00)
Glucose, Bld: 117 mg/dL — ABNORMAL HIGH (ref 70–99)
Potassium: 4.2 mEq/L (ref 3.5–5.1)
SODIUM: 137 meq/L (ref 135–145)
TOTAL PROTEIN: 6.5 g/dL (ref 6.0–8.3)

## 2014-04-13 LAB — HEMOGLOBIN A1C: HEMOGLOBIN A1C: 6 % (ref 4.6–6.5)

## 2014-04-13 NOTE — Patient Instructions (Signed)
Get your blood work before you leave   Next visit is for a physical exam in 1 year,  fasting Please make an appointment    Check the  blood pressure 2 or 3 times a   week be sure it is between 110/60 and 140/85. Ideal blood pressure is 120/80. If it is consistently higher or lower, let me know.

## 2014-04-13 NOTE — Assessment & Plan Note (Addendum)
Td 2008 pneumonia shot 2009, Prevnar today zostavax 2014 01-2008: Cscope, tics, no polyps, next in 10 years Labs. PSA has been stable for years, recheck next year. Chronic medical problems: Recently saw cardiology, felt to be stable. Continue with present medications. Check a A1c Followup in one year as long as he feels well. Check ambulatory BPs, see instructions.

## 2014-04-13 NOTE — Progress Notes (Signed)
Subjective:    Patient ID: Matthew Joyce, male    DOB: 1951-11-11, 63 y.o.   MRN: [id number]  DOS:  04/13/2014 Type of  visit:  CPX Feeling great, no concerns.   ROS Diet-- healthy for the most part Exercise-- stationary bike 50 min qd , golf x 3/week  No  CP, SOB No palpitations, no lower extremity edema Denies  nausea, vomiting diarrhea Denies  blood in the stools (-) cough, sputum production (-) wheezing, chest congestion No dysuria, gross hematuria, difficulty urinating  No anxiety, depression    Past Medical History  Diagnosis Date  . CAD (coronary artery disease)     a- S/p cabg in march 2006 by Dr.Owen;  b. myoview 5/12: large IL scar from apex to base, no ischemia, EF 37%  . Ischemic cardiomyopathy     a-Echocardiogram, Nov 2006, showed ejection fraction of 30-40% with mild to moderated mitral  regurgitation and mild aortic regurgitation b- Cardiac MRI, May 2007, EF of 44% with 50% scar involving  the inferolateral walls. No comment of mitral regurgitation. c- last echo  11/10: EF 30-35%  mod MR d- Nega T-Wave alternans testing in May of 2007 e- no ACE due to low BP;  f. CPX 3/11: normal  . Gout   . Hyperlipidemia   . DM (diabetes mellitus)   . ED (erectile dysfunction)   . Bradycardia     Use of beta blocker limited  . 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 Systolic CHF, chronic   . AICD (automatic cardioverter/defibrillator) present 6/09    St. Jude    Past Surgical History  Procedure Laterality Date  . Tonsillectomy      as a child  . Coronary artery bypass graft  02-2005  . Orif ankle fracture  1987    left   . Shoulder arthroscopy  02/2010    rt   . Cardiac defibrillator placement  06/01/08    ICD    History   Social History  . Marital Status: Married    Spouse Name: N/A    Number of Children: 2  . Years of Education: N/A   Occupational History  . 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     Social History Main Topics  . Smoking status: Former Smoker -- 0.10 packs/day    Types: Cigarettes    Quit date: 12/16/1999  . Smokeless tobacco: Never Used  . Alcohol Use: 0.0 oz/week     Comment: socially  . Drug Use: No  . Sexual Activity: Not on file   Other Topics Concern  . Not on file   Social History Narrative         Family History  Problem Relation Age of Onset  . Heart attack Father 76  . Cardiomyopathy Brother   . Colon cancer Neg Hx   . Prostate cancer Neg Hx   . Diabetes Neg Hx        Medication List       This list is accurate as of: 04/13/14  1:04 PM.  Always use your most recent med list.               aspirin 81 MG tablet  Take 81 mg by mouth daily.     candesartan 4 MG tablet  Commonly known as:  ATACAND  Take 1 tablet (4 mg total) by mouth daily.     cetirizine  10 MG tablet  Commonly known as:  ZYRTEC  Take 10 mg by mouth daily.     Ciclopirox 1 % shampoo  4 (four) times a week.     Fish Oil Oil  Take 3,000 mg by mouth daily.     ketoconazole 2 % shampoo  Commonly known as:  NIZORAL  Apply 1 application topically 2 (two) times a week.     metoprolol succinate 25 MG 24 hr tablet  Commonly known as:  TOPROL-XL  1/2 tab two times a day.     nitroGLYCERIN 0.4 MG SL tablet  Commonly known as:  NITROSTAT  Place 1 tablet (0.4 mg total) under the tongue every 5 (five) minutes as needed.     salicyclic acid-sulfur 2-2 % shampoo  Commonly known as:  SEBULEX  Apply topically daily as needed for itching.     simvastatin 40 MG tablet  Commonly known as:  ZOCOR  Take 1 tablet (40 mg total) by mouth at bedtime.           Objective:   Physical Exam BP 92/55  Pulse 55  Temp(Src) 98 F (36.7 C)  Ht 5\' 9"  (1.753 m)  Wt 186 lb (84.369 kg)  BMI 27.45 kg/m2  SpO2 97% General -- alert, well-developed, NAD.  Neck --no thyromegaly , normal carotid pulse  HEENT-- Not pale.   Lungs -- normal  respiratory effort, no intercostal retractions, no accessory muscle use, and normal breath sounds.  Heart-- normal rate, regular rhythm, no murmur.  Abdomen-- Not distended, good bowel sounds,soft, non-tender.   Extremities-- no pretibial edema bilaterally  Neurologic--  alert & oriented X3. Speech normal, gait normal, strength normal in all extremities.  Psych-- Cognition and judgment appear intact. Cooperative with normal attention span and concentration. No anxious or depressed appearing.       Assessment & Plan:

## 2014-04-13 NOTE — Progress Notes (Signed)
Pre visit review using our clinic review tool, if applicable. No additional management support is needed unless otherwise documented below in the visit note. 

## 2014-04-17 NOTE — Progress Notes (Signed)
ICD remote 

## 2014-04-18 ENCOUNTER — Encounter: Payer: Self-pay | Admitting: Cardiology

## 2014-07-05 ENCOUNTER — Ambulatory Visit (INDEPENDENT_AMBULATORY_CARE_PROVIDER_SITE_OTHER): Payer: BC Managed Care – PPO | Admitting: *Deleted

## 2014-07-05 ENCOUNTER — Telehealth: Payer: Self-pay | Admitting: Cardiology

## 2014-07-05 DIAGNOSIS — I428 Other cardiomyopathies: Secondary | ICD-10-CM

## 2014-07-05 NOTE — Telephone Encounter (Signed)
Spoke with pt and reminded pt of remote transmission that is due today. Pt verbalized understanding.   

## 2014-07-06 NOTE — Progress Notes (Signed)
Remote ICD transmission.   

## 2014-07-10 LAB — MDC_IDC_ENUM_SESS_TYPE_REMOTE
HIGH POWER IMPEDANCE MEASURED VALUE: 48 Ohm
Implantable Pulse Generator Serial Number: 550952
Lead Channel Impedance Value: 330 Ohm
Lead Channel Sensing Intrinsic Amplitude: 5.3 mV
Lead Channel Setting Pacing Amplitude: 2.5 V
Lead Channel Setting Sensing Sensitivity: 0.3 mV
MDC IDC MSMT BATTERY REMAINING LONGEVITY: 43 mo
MDC IDC MSMT BATTERY VOLTAGE: 2.62 V
MDC IDC SESS DTM: 20150722200317
MDC IDC SET LEADCHNL RV PACING PULSEWIDTH: 0.5 ms
MDC IDC SET ZONE DETECTION INTERVAL: 250 ms
MDC IDC SET ZONE DETECTION INTERVAL: 280 ms
MDC IDC SET ZONE DETECTION INTERVAL: 330 ms
MDC IDC STAT BRADY RV PERCENT PACED: 1 %

## 2014-07-21 ENCOUNTER — Encounter: Payer: Self-pay | Admitting: Cardiology

## 2014-08-02 ENCOUNTER — Encounter: Payer: Self-pay | Admitting: Internal Medicine

## 2014-09-08 ENCOUNTER — Other Ambulatory Visit (HOSPITAL_COMMUNITY): Payer: Self-pay | Admitting: Cardiology

## 2014-09-08 DIAGNOSIS — I509 Heart failure, unspecified: Secondary | ICD-10-CM

## 2014-09-08 MED ORDER — SIMVASTATIN 40 MG PO TABS: 40.0000 mg | ORAL_TABLET | Freq: Every day | ORAL | Status: DC

## 2014-09-08 MED ORDER — METOPROLOL SUCCINATE ER 25 MG PO TB24: ORAL_TABLET | ORAL | Status: DC

## 2014-09-08 MED ORDER — CANDESARTAN CILEXETIL 4 MG PO TABS: 4.0000 mg | ORAL_TABLET | Freq: Every day | ORAL | Status: DC

## 2014-10-12 ENCOUNTER — Ambulatory Visit (INDEPENDENT_AMBULATORY_CARE_PROVIDER_SITE_OTHER): Payer: BC Managed Care – PPO | Admitting: Internal Medicine

## 2014-10-12 ENCOUNTER — Encounter: Payer: Self-pay | Admitting: Internal Medicine

## 2014-10-12 VITALS — BP 98/62 | HR 57 | Ht 69.0 in | Wt 193.0 lb

## 2014-10-12 DIAGNOSIS — Z95810 Presence of automatic (implantable) cardiac defibrillator: Secondary | ICD-10-CM

## 2014-10-12 DIAGNOSIS — I5042 Chronic combined systolic (congestive) and diastolic (congestive) heart failure: Secondary | ICD-10-CM

## 2014-10-12 DIAGNOSIS — Z951 Presence of aortocoronary bypass graft: Secondary | ICD-10-CM

## 2014-10-12 LAB — MDC_IDC_ENUM_SESS_TYPE_INCLINIC
Battery Remaining Longevity: 42 mo
Battery Voltage: 2.6 V
Brady Statistic RV Percent Paced: 0.07 %
Date Time Interrogation Session: 20151029144554
HighPow Impedance: 49.5 Ohm
Lead Channel Impedance Value: 300 Ohm
Lead Channel Pacing Threshold Pulse Width: 0.8 ms
Lead Channel Setting Pacing Pulse Width: 0.8 ms
MDC IDC MSMT LEADCHNL RV PACING THRESHOLD AMPLITUDE: 2.25 V
MDC IDC MSMT LEADCHNL RV PACING THRESHOLD AMPLITUDE: 2.25 V
MDC IDC MSMT LEADCHNL RV PACING THRESHOLD PULSEWIDTH: 0.8 ms
MDC IDC MSMT LEADCHNL RV SENSING INTR AMPL: 7 mV
MDC IDC PG SERIAL: 550952
MDC IDC SET LEADCHNL RV PACING AMPLITUDE: 3.5 V
MDC IDC SET LEADCHNL RV SENSING SENSITIVITY: 0.3 mV
MDC IDC SET ZONE DETECTION INTERVAL: 280 ms
Zone Setting Detection Interval: 250 ms
Zone Setting Detection Interval: 330 ms

## 2014-10-12 NOTE — Assessment & Plan Note (Signed)
His St. Jude single chamber defibrillator is working normally. We'll plan to recheck in several months.

## 2014-10-12 NOTE — Progress Notes (Signed)
HPI Mr. Bunda returns today for followup. He is a pleasant middle aged man with a h/o ICM, chronic class 1-2 CHF, and HTN. He is s/p ICD implant. In the interim, he has done well. He denies chest pain, shortness of breath, peripheral edema, or syncope. No ICD shocks. In the interim he remains retired from work, and his mental outlook is much improved. He plays golf regularly. Allergies  Allergen Reactions  . Atorvastatin     REACTION: myalgia  . Sulfonamide Derivatives      Current Outpatient Prescriptions  Medication Sig Dispense Refill  . aspirin 81 MG tablet Take 81 mg by mouth daily.        . candesartan (ATACAND) 4 MG tablet Take 1 tablet (4 mg total) by mouth daily.  90 tablet  3  . cetirizine (ZYRTEC) 10 MG tablet Take 10 mg by mouth daily.        . Ciclopirox 1 % shampoo 4 (four) times a week.      . Fish Oil OIL Take 3,000 mg by mouth daily.       Marland Kitchen ketoconazole (NIZORAL) 2 % shampoo Apply 1 application topically 2 (two) times a week.  120 mL  1  . metoprolol succinate (TOPROL-XL) 25 MG 24 hr tablet 1/2 tab two times a day.  90 tablet  3  . nitroGLYCERIN (NITROSTAT) 0.4 MG SL tablet Place 1 tablet (0.4 mg total) under the tongue every 5 (five) minutes as needed.  30 tablet  2  . simvastatin (ZOCOR) 40 MG tablet Take 1 tablet (40 mg total) by mouth at bedtime.  90 tablet  3   No current facility-administered medications for this visit.     Past Medical History  Diagnosis Date  . CAD (coronary artery disease)     a- S/p cabg in march 2006 by Dr.Owen;  b. myoview 5/12: large IL scar from apex to base, no ischemia, EF 37%  . Ischemic cardiomyopathy     a-Echocardiogram, Nov 2006, showed ejection fraction of 30-40% with mild to moderated mitral  regurgitation and mild aortic regurgitation b- Cardiac MRI, May 2007, EF of 44% with 50% scar involving  the inferolateral walls. No comment of mitral regurgitation. c- last echo  11/10: EF 30-35%  mod MR d- Nega T-Wave alternans testing  in May of 2007 e- no ACE due to low BP;  f. CPX 3/11: normal  . Gout   . Hyperlipidemia   . DM (diabetes mellitus)   . ED (erectile dysfunction)   . Bradycardia     Use of beta blocker limited  . 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 Systolic CHF, chronic   . AICD (automatic cardioverter/defibrillator) present 6/09    St. Jude    ROS:   All systems reviewed and negative except as noted in the HPI.   Past Surgical History  Procedure Laterality Date  . Tonsillectomy      as a child  . Coronary artery bypass graft  02-2005  . Orif ankle fracture  1987    left   . Shoulder arthroscopy  02/2010    rt   . Cardiac defibrillator placement  06/01/08    ICD     Family History  Problem Relation Age of Onset  . Heart attack Father 62  . Cardiomyopathy Brother   . Colon cancer Neg Hx   . Prostate cancer Neg Hx   . Diabetes Neg Hx  History   Social History  . Marital Status: Married    Spouse Name: N/A    Number of Children: 2  . Years of Education: N/A   Occupational History  . 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     Social History Main Topics  . Smoking status: Former Smoker -- 0.10 packs/day    Types: Cigarettes    Quit date: 12/16/1999  . Smokeless tobacco: Never Used  . Alcohol Use: 0.0 oz/week     Comment: socially  . Drug Use: No  . Sexual Activity: Not on file   Other Topics Concern  . Not on file   Social History Narrative         BP 98/62  Pulse 57  Ht 5\' 9"  (1.753 m)  Wt 193 lb (87.544 kg)  BMI 28.49 kg/m2  Physical Exam:  Well appearing middle-aged man, NAD HEENT: Unremarkable Neck:  6 cm JVD, no thyromegally Lungs:  Clear with no wheezes, rales, or rhonchi. Well-healed ICD incision. HEART:  Regular rate rhythm, no murmurs, no rubs, no clicks Abd:  soft, positive bowel sounds, no organomegally, no rebound, no guarding Ext:  2 plus pulses, no edema,  no cyanosis, no clubbing Skin:  No rashes no nodules Neuro:  CN II through XII intact, motor grossly intact  DEVICE  Normal device function.  See PaceArt for details.   Assess/Plan:

## 2014-10-12 NOTE — Assessment & Plan Note (Signed)
His chronic systolic heart failure is class IIA. He'll continue his current medical therapy, and in a low-sodium diet, and increase his physical activity.

## 2014-10-12 NOTE — Assessment & Plan Note (Signed)
The patient denies anginal symptoms. He will continue his current medical therapy. He will increase his physical activity.

## 2014-10-12 NOTE — Patient Instructions (Signed)
Your physician wants you to follow-up in: 12 months with Dr Knox Saliva will receive a reminder letter in the mail two months in advance. If you don't receive a letter, please call our office to schedule the follow-up appointment.  Remote monitoring is used to monitor your Pacemaker or ICD from home. This monitoring reduces the number of office visits required to check your device to one time per year. It allows [place] to keep an eye on the functioning of your device to ensure it is working properly. You are scheduled for a device check from home on 01/15/15. You may send your transmission at any time that day. If you have a wireless device, the transmission will be sent automatically. After your physician reviews your transmission, you will receive a postcard with your next transmission date.

## 2014-10-24 ENCOUNTER — Ambulatory Visit (INDEPENDENT_AMBULATORY_CARE_PROVIDER_SITE_OTHER): Payer: BC Managed Care – PPO | Admitting: *Deleted

## 2014-10-24 ENCOUNTER — Ambulatory Visit: Payer: BC Managed Care – PPO

## 2014-10-24 DIAGNOSIS — Z23 Encounter for immunization: Secondary | ICD-10-CM

## 2015-01-15 ENCOUNTER — Ambulatory Visit (INDEPENDENT_AMBULATORY_CARE_PROVIDER_SITE_OTHER): Payer: BC Managed Care – PPO | Admitting: *Deleted

## 2015-01-15 DIAGNOSIS — Z95810 Presence of automatic (implantable) cardiac defibrillator: Secondary | ICD-10-CM

## 2015-01-15 LAB — MDC_IDC_ENUM_SESS_TYPE_REMOTE
Battery Remaining Longevity: 37 mo
Battery Voltage: 2.6 V
Date Time Interrogation Session: 20160201082813
HighPow Impedance: 52 Ohm
Implantable Pulse Generator Serial Number: 550952
Lead Channel Impedance Value: 330 Ohm
Lead Channel Pacing Threshold Pulse Width: 0.8 ms
Lead Channel Sensing Intrinsic Amplitude: 7.3 mV
Lead Channel Setting Pacing Pulse Width: 0.8 ms
Lead Channel Setting Sensing Sensitivity: 0.3 mV
MDC IDC MSMT LEADCHNL RV PACING THRESHOLD AMPLITUDE: 2.25 V
MDC IDC SET LEADCHNL RV PACING AMPLITUDE: 3.5 V
MDC IDC SET ZONE DETECTION INTERVAL: 280 ms
MDC IDC SET ZONE DETECTION INTERVAL: 330 ms
MDC IDC STAT BRADY RV PERCENT PACED: 1 %
Zone Setting Detection Interval: 250 ms

## 2015-01-15 NOTE — Progress Notes (Signed)
Remote ICD transmission.   

## 2015-01-18 ENCOUNTER — Encounter: Payer: Self-pay | Admitting: Cardiology

## 2015-01-23 ENCOUNTER — Encounter: Payer: Self-pay | Admitting: Internal Medicine

## 2015-02-28 ENCOUNTER — Other Ambulatory Visit (HOSPITAL_COMMUNITY): Payer: Self-pay | Admitting: Cardiology

## 2015-02-28 ENCOUNTER — Telehealth (HOSPITAL_COMMUNITY): Payer: Self-pay | Admitting: Vascular Surgery

## 2015-02-28 DIAGNOSIS — I5022 Chronic systolic (congestive) heart failure: Secondary | ICD-10-CM

## 2015-02-28 MED ORDER — METOPROLOL SUCCINATE ER 25 MG PO TB24: ORAL_TABLET | ORAL | Status: DC

## 2015-02-28 MED ORDER — SIMVASTATIN 40 MG PO TABS: 40.0000 mg | ORAL_TABLET | Freq: Every day | ORAL | Status: DC

## 2015-02-28 MED ORDER — CANDESARTAN CILEXETIL 4 MG PO TABS: 4.0000 mg | ORAL_TABLET | Freq: Every day | ORAL | Status: DC

## 2015-03-30 ENCOUNTER — Telehealth: Payer: Self-pay | Admitting: Internal Medicine

## 2015-03-30 NOTE — Telephone Encounter (Signed)
Pre Visit letter sent  °

## 2015-04-16 ENCOUNTER — Ambulatory Visit (INDEPENDENT_AMBULATORY_CARE_PROVIDER_SITE_OTHER): Payer: 59 | Admitting: *Deleted

## 2015-04-16 DIAGNOSIS — Z95810 Presence of automatic (implantable) cardiac defibrillator: Secondary | ICD-10-CM | POA: Diagnosis not present

## 2015-04-16 NOTE — Progress Notes (Signed)
Remote ICD transmission.   

## 2015-04-17 ENCOUNTER — Telehealth: Payer: Self-pay | Admitting: *Deleted

## 2015-04-17 ENCOUNTER — Encounter: Payer: Self-pay | Admitting: *Deleted

## 2015-04-17 NOTE — Telephone Encounter (Signed)
Pre-Visit Call completed with patient and chart updated.   Pre-Visit Info documented in Specialty Comments under SnapShot.    

## 2015-04-18 ENCOUNTER — Ambulatory Visit (INDEPENDENT_AMBULATORY_CARE_PROVIDER_SITE_OTHER): Payer: 59 | Admitting: Internal Medicine

## 2015-04-18 ENCOUNTER — Encounter: Payer: Self-pay | Admitting: Internal Medicine

## 2015-04-18 VITALS — BP 106/82 | HR 46 | Temp 97.8°F | Ht 69.0 in | Wt 193.4 lb

## 2015-04-18 DIAGNOSIS — R7309 Other abnormal glucose: Secondary | ICD-10-CM | POA: Diagnosis not present

## 2015-04-18 DIAGNOSIS — Z125 Encounter for screening for malignant neoplasm of prostate: Secondary | ICD-10-CM | POA: Diagnosis not present

## 2015-04-18 DIAGNOSIS — Z0000 Encounter for general adult medical examination without abnormal findings: Secondary | ICD-10-CM | POA: Diagnosis not present

## 2015-04-18 DIAGNOSIS — R7303 Prediabetes: Secondary | ICD-10-CM

## 2015-04-18 LAB — COMPREHENSIVE METABOLIC PANEL
ALT: 17 U/L (ref 0–53)
AST: 15 U/L (ref 0–37)
Albumin: 3.7 g/dL (ref 3.5–5.2)
Alkaline Phosphatase: 53 U/L (ref 39–117)
BILIRUBIN TOTAL: 1.1 mg/dL (ref 0.2–1.2)
BUN: 20 mg/dL (ref 6–23)
CO2: 30 mEq/L (ref 19–32)
Calcium: 9.5 mg/dL (ref 8.4–10.5)
Chloride: 104 mEq/L (ref 96–112)
Creatinine, Ser: 0.79 mg/dL (ref 0.40–1.50)
GFR: 104.7 mL/min (ref >60.00)
GLUCOSE: 135 mg/dL — AB (ref 70–99)
Potassium: 4.6 mEq/L (ref 3.5–5.1)
Sodium: 137 mEq/L (ref 135–145)
TOTAL PROTEIN: 6.1 g/dL (ref 6.0–8.3)

## 2015-04-18 LAB — LIPID PANEL
CHOL/HDL RATIO: 4
CHOLESTEROL: 155 mg/dL (ref 0–200)
HDL: 41 mg/dL (ref >39.00)
LDL Cholesterol: 98 mg/dL (ref 0–99)
NonHDL: 114
Triglycerides: 81 mg/dL (ref 0.0–149.0)
VLDL: 16.2 mg/dL (ref 0.0–40.0)

## 2015-04-18 LAB — HEMOGLOBIN A1C: HEMOGLOBIN A1C: 6 % (ref 4.6–6.5)

## 2015-04-18 LAB — AST: AST: 15 U/L (ref 0–37)

## 2015-04-18 LAB — PSA: PSA: 0.43 ng/mL (ref 0.10–4.00)

## 2015-04-18 LAB — ALT: ALT: 17 U/L (ref 0–53)

## 2015-04-18 NOTE — Patient Instructions (Signed)
Get your blood work before you leave   Check the  blood pressure 2 or 3 times a month  Be sure your blood pressure is between 110/65 and  145/85.  if it is consistently higher or lower, let me know   Come back to the office in 1 year for a physical exam  Please schedule an appointment at the front desk    Come back fasting

## 2015-04-18 NOTE — Progress Notes (Signed)
Subjective:    Patient ID: Matthew Joyce, male    DOB: January 10, 1951, 64 y.o.   MRN: [id number]  DOS:  04/18/2015 Type of visit - description : cpx Interval history: Feeling well, he remains active, good compliance of medication without apparent side effects. Slightly bradycardic but asymptomatic.   Review of Systems Constitutional: No fever, chills. No unexplained wt changes. No unusual sweats HEENT: No dental problems, ear discharge, facial swelling, voice changes. No eye discharge, redness or intolerance to light Respiratory: No wheezing or difficulty breathing. No cough , mucus production Cardiovascular: No CP, leg swelling or palpitations GI: no nausea, vomiting, diarrhea or abdominal pain.  No blood in the stools. No dysphagia   Endocrine: No polyphagia, polyuria or polydipsia GU: No dysuria, gross hematuria, difficulty urinating. No urinary urgency or frequency. Musculoskeletal: No joint swellings or unusual aches or pains Skin: No change in the color of the skin, palor or rash Allergic, immunologic: No environmental allergies or food allergies Neurological: No dizziness or syncope. No headaches. No diplopia, slurred speech, motor deficits, facial numbness Hematological: No enlarged lymph nodes, easy bruising or bleeding Psychiatry: No suicidal ideas, hallucinations, behavior problems or confusion. No unusual/severe anxiety or depression.     Past Medical History  Diagnosis Date  . CAD (coronary artery disease)     a- S/p cabg in march 2006 by Dr.Owen;  b. myoview 5/12: large IL scar from apex to base, no ischemia, EF 37%  . Ischemic cardiomyopathy     a-Echocardiogram, Nov 2006, showed ejection fraction of 30-40% with mild to moderated mitral  regurgitation and mild aortic regurgitation b- Cardiac MRI, May 2007, EF of 44% with 50% scar involving  the inferolateral walls. No comment of mitral regurgitation. c- last echo  11/10: EF 30-35%  mod MR d- Nega T-Wave alternans testing  in May of 2007 e- no ACE due to low BP;  f. CPX 3/11: normal  . Gout   . Hyperlipidemia   . DM (diabetes mellitus)   . ED (erectile dysfunction)   . Bradycardia     Use of beta blocker limited  . 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 Systolic CHF, chronic   . AICD (automatic cardioverter/defibrillator) present 6/09    St. Jude    Past Surgical History  Procedure Laterality Date  . Tonsillectomy      as a child  . Coronary artery bypass graft  02-2005  . Orif ankle fracture  1987    left   . Shoulder arthroscopy  02/2010    rt   . Cardiac defibrillator placement  06/01/08    ICD    History   Social History  . Marital Status: Married    Spouse Name: N/A  . Number of Children: 2  . Years of Education: N/A   Occupational History  . 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     Social History Main Topics  . Smoking status: Former Smoker -- 0.10 packs/day    Types: Cigarettes    Quit date: 12/16/1999  . Smokeless tobacco: Never Used  . Alcohol Use: 0.0 oz/week     Comment: socially  . Drug Use: No  . Sexual Activity: Not on file   Other Topics Concern  . Not on file   Social History Narrative   2 adult children      Family History  Problem Relation Age of Onset  . Heart  attack Father 86  . Cardiomyopathy Brother   . Colon cancer Neg Hx   . Prostate cancer Neg Hx   . Diabetes Neg Hx        Medication List       This list is accurate as of: 04/18/15  5:51 PM.  Always use your most recent med list.               aspirin 81 MG tablet  Take 81 mg by mouth daily.     candesartan 4 MG tablet  Commonly known as:  ATACAND  Take 1 tablet (4 mg total) by mouth daily.     cetirizine 10 MG tablet  Commonly known as:  ZYRTEC  Take 10 mg by mouth daily.     Ciclopirox 1 % shampoo  4 (four) times a week.     Fish Oil Oil  Take 3,000 mg by mouth daily.     ketoconazole 2 %  shampoo  Commonly known as:  NIZORAL  Apply 1 application topically 2 (two) times a week.     metoprolol succinate 25 MG 24 hr tablet  Commonly known as:  TOPROL-XL  1/2 tab two times a day.     nitroGLYCERIN 0.4 MG SL tablet  Commonly known as:  NITROSTAT  Place 1 tablet (0.4 mg total) under the tongue every 5 (five) minutes as needed.     simvastatin 40 MG tablet  Commonly known as:  ZOCOR  Take 1 tablet (40 mg total) by mouth at bedtime.           Objective:   Physical Exam BP 106/82 mmHg  Pulse 46  Temp(Src) 97.8 F (36.6 C) (Oral)  Ht 5\' 9"  (1.753 m)  Wt 193 lb 6 oz (87.714 kg)  BMI 28.54 kg/m2  SpO2 98%  General:   Well developed, well nourished . NAD.  Neck:  Full range of motion. Supple. No  thyromegaly , normal carotid pulse HEENT:  Normocephalic . Face symmetric, atraumatic Lungs:  CTA B Normal respiratory effort, no intercostal retractions, no accessory muscle use. Heart: RRR,  no murmur.  No pretibial edema bilaterally  Abdomen:  Not distended, soft, non-tender. No rebound or rigidity. No mass,organomegaly, no bruit Skin: Exposed areas without rash. Not pale. Not jaundice Rectal:  External abnormalities: none. Normal sphincter tone. No rectal masses or tenderness.  Stool brown  Prostate: Prostate gland firm and smooth, no enlargement, nodularity, tenderness, mass, asymmetry or induration.  Neurologic:  alert & oriented X3.  Speech normal, gait appropriate for age and unassisted Strength symmetric and appropriate for age.  Psych: Cognition and judgment appear intact.  Cooperative with normal attention span and concentration.  Behavior appropriate. No anxious or depressed appearing.       Assessment & Plan:

## 2015-04-18 NOTE — Progress Notes (Signed)
Pre visit review using our clinic review tool, if applicable. No additional management support is needed unless otherwise documented below in the visit note. 

## 2015-04-18 NOTE — Assessment & Plan Note (Signed)
Td 2008 pneumonia shot 2009, Prevnar 2015 zostavax 2014  01-2008: Cscope, tics, no polyps, next in 10 years Prostate cancer screening: DRE normal today, checking a PSA Labs. BMP, AST, ALT, FLP, A1c, PSA  Diet and exercise discussed  Chronic medical problems: CV-- asymptomatic, continue with beta blockers, ARBs, simvastatin.next f/u w/ cards 09-2015 Prediabetes, check A1c  Followup in one year. Check ambulatory BPs, see instructions.

## 2015-04-20 LAB — CUP PACEART REMOTE DEVICE CHECK
Brady Statistic RV Percent Paced: 1 %
Date Time Interrogation Session: 20160502065655
HighPow Impedance: 52 Ohm
Lead Channel Pacing Threshold Amplitude: 2.25 V
Lead Channel Pacing Threshold Pulse Width: 0.8 ms
Lead Channel Sensing Intrinsic Amplitude: 6.7 mV
Lead Channel Setting Pacing Pulse Width: 0.8 ms
Lead Channel Setting Sensing Sensitivity: 0.3 mV
MDC IDC MSMT BATTERY REMAINING LONGEVITY: 37 mo
MDC IDC MSMT BATTERY VOLTAGE: 2.6 V
MDC IDC MSMT LEADCHNL RV IMPEDANCE VALUE: 300 Ohm
MDC IDC PG SERIAL: 550952
MDC IDC SET LEADCHNL RV PACING AMPLITUDE: 3.5 V
MDC IDC SET ZONE DETECTION INTERVAL: 280 ms
Zone Setting Detection Interval: 250 ms
Zone Setting Detection Interval: 330 ms

## 2015-04-26 ENCOUNTER — Encounter: Payer: Self-pay | Admitting: Cardiology

## 2015-05-03 ENCOUNTER — Encounter: Payer: Self-pay | Admitting: Internal Medicine

## 2015-05-24 NOTE — Telephone Encounter (Signed)
Open in error

## 2015-06-15 ENCOUNTER — Telehealth (HOSPITAL_COMMUNITY): Payer: Self-pay | Admitting: Vascular Surgery

## 2015-06-15 ENCOUNTER — Other Ambulatory Visit (HOSPITAL_COMMUNITY): Payer: Self-pay

## 2015-06-15 DIAGNOSIS — I5022 Chronic systolic (congestive) heart failure: Secondary | ICD-10-CM

## 2015-06-15 MED ORDER — METOPROLOL SUCCINATE ER 25 MG PO TB24: ORAL_TABLET | ORAL | Status: DC

## 2015-06-15 MED ORDER — CANDESARTAN CILEXETIL 4 MG PO TABS: 4.0000 mg | ORAL_TABLET | Freq: Every day | ORAL | Status: DC

## 2015-06-15 MED ORDER — SIMVASTATIN 40 MG PO TABS: 40.0000 mg | ORAL_TABLET | Freq: Every day | ORAL | Status: DC

## 2015-06-15 NOTE — Telephone Encounter (Addendum)
Recall simvastan, metoprolol , candesartan 90 day supply sent to [place] he has new insurance he will not be using the mail order pharmacy anymore.. Please advise

## 2015-06-15 NOTE — Telephone Encounter (Signed)
Refills sent electronically per request.

## 2015-07-17 ENCOUNTER — Ambulatory Visit (INDEPENDENT_AMBULATORY_CARE_PROVIDER_SITE_OTHER): Payer: Medicare Other | Admitting: *Deleted

## 2015-07-17 DIAGNOSIS — Z95810 Presence of automatic (implantable) cardiac defibrillator: Secondary | ICD-10-CM | POA: Diagnosis not present

## 2015-07-18 NOTE — Progress Notes (Signed)
Remote ICD transmission.   

## 2015-07-23 LAB — CUP PACEART REMOTE DEVICE CHECK
Battery Remaining Longevity: 35 mo
Battery Voltage: 2.59 V
Brady Statistic RV Percent Paced: 1 %
Date Time Interrogation Session: 20160802072751
HIGH POWER IMPEDANCE MEASURED VALUE: 50 Ohm
Lead Channel Impedance Value: 310 Ohm
Lead Channel Pacing Threshold Amplitude: 2.25 V
Lead Channel Pacing Threshold Pulse Width: 0.8 ms
Lead Channel Sensing Intrinsic Amplitude: 6.9 mV
Lead Channel Setting Pacing Amplitude: 3.5 V
Lead Channel Setting Pacing Pulse Width: 0.8 ms
Lead Channel Setting Sensing Sensitivity: 0.3 mV
MDC IDC SET ZONE DETECTION INTERVAL: 280 ms
Pulse Gen Serial Number: 550952
Zone Setting Detection Interval: 250 ms
Zone Setting Detection Interval: 330 ms

## 2015-07-29 ENCOUNTER — Encounter: Payer: Self-pay | Admitting: Internal Medicine

## 2015-08-15 ENCOUNTER — Encounter: Payer: Self-pay | Admitting: Cardiology

## 2015-08-21 ENCOUNTER — Encounter: Payer: Self-pay | Admitting: Internal Medicine

## 2015-08-29 ENCOUNTER — Encounter: Payer: Self-pay | Admitting: Cardiology

## 2015-09-13 DIAGNOSIS — D1801 Hemangioma of skin and subcutaneous tissue: Secondary | ICD-10-CM | POA: Diagnosis not present

## 2015-09-13 DIAGNOSIS — Z1283 Encounter for screening for malignant neoplasm of skin: Secondary | ICD-10-CM | POA: Diagnosis not present

## 2015-09-13 DIAGNOSIS — L821 Other seborrheic keratosis: Secondary | ICD-10-CM | POA: Diagnosis not present

## 2015-10-17 ENCOUNTER — Encounter: Payer: Self-pay | Admitting: Internal Medicine

## 2015-10-17 ENCOUNTER — Ambulatory Visit (INDEPENDENT_AMBULATORY_CARE_PROVIDER_SITE_OTHER): Payer: Medicare Other | Admitting: Internal Medicine

## 2015-10-17 VITALS — BP 110/68 | HR 59 | Ht 69.5 in | Wt 191.0 lb

## 2015-10-17 DIAGNOSIS — Z95810 Presence of automatic (implantable) cardiac defibrillator: Secondary | ICD-10-CM | POA: Diagnosis not present

## 2015-10-17 DIAGNOSIS — I080 Rheumatic disorders of both mitral and aortic valves: Secondary | ICD-10-CM

## 2015-10-17 LAB — CUP PACEART INCLINIC DEVICE CHECK
Battery Remaining Longevity: 39.6
Brady Statistic RV Percent Paced: 0.04 %
HIGH POWER IMPEDANCE MEASURED VALUE: 53.4205
Lead Channel Impedance Value: 325 Ohm
Lead Channel Pacing Threshold Amplitude: 1.5 V
Lead Channel Pacing Threshold Amplitude: 1.5 V
Lead Channel Pacing Threshold Pulse Width: 0.8 ms
Lead Channel Sensing Intrinsic Amplitude: 6.9 mV
Lead Channel Setting Pacing Pulse Width: 0.8 ms
Lead Channel Setting Sensing Sensitivity: 0.3 mV
MDC IDC LEAD IMPLANT DT: 20090618
MDC IDC LEAD LOCATION: 753860
MDC IDC LEAD MODEL: 7121
MDC IDC MSMT BATTERY VOLTAGE: 2.59 V
MDC IDC MSMT LEADCHNL RV PACING THRESHOLD PULSEWIDTH: 0.8 ms
MDC IDC SESS DTM: 20161102124232
MDC IDC SET LEADCHNL RV PACING AMPLITUDE: 3 V
Pulse Gen Serial Number: 550952

## 2015-10-17 MED ORDER — NITROGLYCERIN 0.4 MG SL SUBL: 0.4000 mg | SUBLINGUAL_TABLET | SUBLINGUAL | Status: DC | PRN

## 2015-10-17 NOTE — Patient Instructions (Signed)
Your physician recommends that you continue on your current medications as directed. Please refer to the Current Medication list given to you today. Remote monitoring is used to monitor your Pacemaker of ICD from home. This monitoring reduces the number of office visits required to check your device to one time per year. It allows [place] to keep an eye on the functioning of your device to ensure it is working properly. You are scheduled for a device check from home on  01/16/2016. You may send your transmission at any time that day. If you have a wireless device, the transmission will be sent automatically. After your physician reviews your transmission, you will receive a postcard with your next transmission date.  Your physician wants you to follow-up in: 1 year with [name]. You will receive a reminder letter in the mail two months in advance. If you don't receive a letter, please call our office to schedule the follow-up appointment.

## 2015-10-17 NOTE — Progress Notes (Signed)
HPI Matthew Joyce returns today for followup. He is a pleasant middle aged man with a h/o ICM, chronic class 1-2 CHF, and HTN. He is s/p ICD implant. In the interim, he has done well. He denies chest pain, shortness of breath, peripheral edema, or syncope. No ICD shocks. In the interim he feels well and enjoys retirement.He plays golf regularly. Allergies  Allergen Reactions  . Atorvastatin     REACTION: myalgia  . Sulfonamide Derivatives     Rash and hot flashes     Current Outpatient Prescriptions  Medication Sig Dispense Refill  . aspirin 81 MG tablet Take 81 mg by mouth daily.      . candesartan (ATACAND) 4 MG tablet Take 1 tablet (4 mg total) by mouth daily. 90 tablet 3  . cetirizine (ZYRTEC) 10 MG tablet Take 10 mg by mouth daily.      . Fish Oil OIL Take 3,000 mg by mouth daily.     . metoprolol succinate (TOPROL-XL) 25 MG 24 hr tablet 1/2 tab two times a day. 90 tablet 3  . simvastatin (ZOCOR) 40 MG tablet Take 1 tablet (40 mg total) by mouth at bedtime. 90 tablet 3  . nitroGLYCERIN (NITROSTAT) 0.4 MG SL tablet Place 1 tablet (0.4 mg total) under the tongue every 5 (five) minutes as needed. (Patient not taking: Reported on 04/18/2015) 30 tablet 2   No current facility-administered medications for this visit.     Past Medical History  Diagnosis Date  . CAD (coronary artery disease)     a- S/p cabg in march 2006 by Matthew Joyce;  b. myoview 5/12: large IL scar from apex to base, no ischemia, EF 37%  . Ischemic cardiomyopathy     a-Echocardiogram, Nov 2006, showed ejection fraction of 30-40% with mild to moderated mitral  regurgitation and mild aortic regurgitation b- Cardiac MRI, May 2007, EF of 44% with 50% scar involving  the inferolateral walls. No comment of mitral regurgitation. c- last echo  11/10: EF 30-35%  mod MR d- Nega T-Wave alternans testing in May of 2007 e- no ACE due to low BP;  f. CPX 3/11: normal  . Gout   . Hyperlipidemia   . DM (diabetes mellitus) ([place])   . ED  (erectile dysfunction)   . Bradycardia     Use of beta blocker limited  . 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 Systolic CHF, chronic ([place])   . AICD (automatic cardioverter/defibrillator) present 6/09    St. Jude    ROS:   All systems reviewed and negative except as noted in the HPI.   Past Surgical History  Procedure Laterality Date  . Tonsillectomy      as a child  . Coronary artery bypass graft  02-2005  . Orif ankle fracture  1987    left   . Shoulder arthroscopy  02/2010    rt   . Cardiac defibrillator placement  06/01/08    ICD     Family History  Problem Relation Age of Onset  . Heart attack Father 29  . Cardiomyopathy Brother   . Colon cancer Neg Hx   . Prostate cancer Neg Hx   . Diabetes Neg Hx      Social History   Social History  . Marital Status: Married    Spouse Name: N/A  . Number of Children: 2  . Years of Education: N/A   Occupational History  . 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     Social History Main Topics  . Smoking status: Former Smoker -- 0.10 packs/day    Types: Cigarettes    Quit date: 12/16/1999  . Smokeless tobacco: Never Used  . Alcohol Use: 0.0 oz/week     Comment: socially  . Drug Use: No  . Sexual Activity: Not on file   Other Topics Concern  . Not on file   Social History Narrative   2 adult children      BP 110/68 mmHg  Pulse 59  Ht 5' 9.5" (1.765 m)  Wt 191 lb (86.637 kg)  BMI 27.81 kg/m2  SpO2 93%  Physical Exam:  Well appearing middle-aged man, NAD HEENT: Unremarkable Neck:  6 cm JVD, no thyromegally Lungs:  Clear with no wheezes, rales, or rhonchi. Well-healed ICD incision. HEART:  Regular rate rhythm, no murmurs, no rubs, no clicks Abd:  soft, positive bowel sounds, no organomegally, no rebound, no guarding Ext:  2 plus pulses, no edema, no cyanosis, no clubbing Skin:  No rashes no nodules Neuro:  CN II through  XII intact, motor grossly intact  DEVICE  Normal device function.  See PaceArt for details.   Assess/Plan:

## 2015-10-17 NOTE — Assessment & Plan Note (Signed)
His St. Jude ICD is working normally. We'll plan to recheck in several months. 

## 2015-10-17 NOTE — Assessment & Plan Note (Signed)
His symptoms remain class 2A. He will continue his current medications.

## 2015-11-20 ENCOUNTER — Ambulatory Visit (INDEPENDENT_AMBULATORY_CARE_PROVIDER_SITE_OTHER): Payer: Medicare Other

## 2015-11-20 DIAGNOSIS — Z23 Encounter for immunization: Secondary | ICD-10-CM

## 2015-11-20 NOTE — Addendum Note (Signed)
Addended by: Bunnie Domino on: 11/20/2015 02:42 PM   Modules accepted: Orders

## 2016-01-04 ENCOUNTER — Encounter: Payer: Self-pay | Admitting: Internal Medicine

## 2016-01-16 ENCOUNTER — Ambulatory Visit (INDEPENDENT_AMBULATORY_CARE_PROVIDER_SITE_OTHER): Payer: Medicare Other | Admitting: *Deleted

## 2016-01-16 DIAGNOSIS — Z95810 Presence of automatic (implantable) cardiac defibrillator: Secondary | ICD-10-CM | POA: Diagnosis not present

## 2016-01-16 NOTE — Progress Notes (Signed)
Remote ICD transmission.   

## 2016-02-05 LAB — CUP PACEART REMOTE DEVICE CHECK
Battery Voltage: 2.57 V
Brady Statistic RV Percent Paced: 1 %
HighPow Impedance: 53 Ohm
Implantable Lead Location: 753860
Implantable Lead Model: 7121
Lead Channel Setting Pacing Amplitude: 3 V
Lead Channel Setting Pacing Pulse Width: 0.8 ms
Lead Channel Setting Sensing Sensitivity: 0.3 mV
MDC IDC LEAD IMPLANT DT: 20090618
MDC IDC MSMT BATTERY REMAINING LONGEVITY: 33 mo
MDC IDC MSMT LEADCHNL RV IMPEDANCE VALUE: 310 Ohm
MDC IDC MSMT LEADCHNL RV PACING THRESHOLD AMPLITUDE: 1.5 V
MDC IDC MSMT LEADCHNL RV PACING THRESHOLD PULSEWIDTH: 0.8 ms
MDC IDC MSMT LEADCHNL RV SENSING INTR AMPL: 6.3 mV
MDC IDC SESS DTM: 20170201083014
Pulse Gen Serial Number: 550952

## 2016-02-08 ENCOUNTER — Encounter: Payer: Self-pay | Admitting: Cardiology

## 2016-04-16 ENCOUNTER — Encounter: Payer: Medicare Other | Admitting: *Deleted

## 2016-04-18 ENCOUNTER — Encounter: Payer: Self-pay | Admitting: Cardiology

## 2016-04-21 ENCOUNTER — Ambulatory Visit (INDEPENDENT_AMBULATORY_CARE_PROVIDER_SITE_OTHER): Payer: Medicare Other | Admitting: Internal Medicine

## 2016-04-21 ENCOUNTER — Encounter: Payer: Self-pay | Admitting: Internal Medicine

## 2016-04-21 VITALS — BP 118/62 | HR 50 | Temp 97.9°F | Ht 70.0 in | Wt 198.0 lb

## 2016-04-21 DIAGNOSIS — E785 Hyperlipidemia, unspecified: Secondary | ICD-10-CM

## 2016-04-21 DIAGNOSIS — Z09 Encounter for follow-up examination after completed treatment for conditions other than malignant neoplasm: Secondary | ICD-10-CM | POA: Insufficient documentation

## 2016-04-21 DIAGNOSIS — Z0000 Encounter for general adult medical examination without abnormal findings: Secondary | ICD-10-CM | POA: Diagnosis not present

## 2016-04-21 DIAGNOSIS — R739 Hyperglycemia, unspecified: Secondary | ICD-10-CM | POA: Diagnosis not present

## 2016-04-21 DIAGNOSIS — I25709 Atherosclerosis of coronary artery bypass graft(s), unspecified, with unspecified angina pectoris: Secondary | ICD-10-CM | POA: Diagnosis not present

## 2016-04-21 DIAGNOSIS — Z114 Encounter for screening for human immunodeficiency virus [HIV]: Secondary | ICD-10-CM

## 2016-04-21 LAB — CBC WITH DIFFERENTIAL/PLATELET
Basophils Absolute: 0 10*3/uL (ref 0.0–0.1)
Basophils Relative: 0.6 % (ref 0.0–3.0)
EOS PCT: 4.2 % (ref 0.0–5.0)
Eosinophils Absolute: 0.3 10*3/uL (ref 0.0–0.7)
HCT: 41.4 % (ref 39.0–52.0)
Hemoglobin: 14 g/dL (ref 13.0–17.0)
LYMPHS ABS: 1.9 10*3/uL (ref 0.7–4.0)
Lymphocytes Relative: 25.4 % (ref 12.0–46.0)
MCHC: 33.9 g/dL (ref 30.0–36.0)
MCV: 89 fl (ref 78.0–100.0)
MONO ABS: 0.9 10*3/uL (ref 0.1–1.0)
MONOS PCT: 11.4 % (ref 3.0–12.0)
NEUTROS ABS: 4.5 10*3/uL (ref 1.4–7.7)
NEUTROS PCT: 58.4 % (ref 43.0–77.0)
PLATELETS: 230 10*3/uL (ref 150.0–400.0)
RBC: 4.66 Mil/uL (ref 4.22–5.81)
RDW: 13.3 % (ref 11.5–15.5)
WBC: 7.7 10*3/uL (ref 4.0–10.5)

## 2016-04-21 LAB — LIPID PANEL
Cholesterol: 164 mg/dL (ref 0–200)
HDL: 50.4 mg/dL (ref >39.00)
LDL Cholesterol: 88 mg/dL (ref 0–99)
NonHDL: 113.88
TRIGLYCERIDES: 129 mg/dL (ref 0.0–149.0)
Total CHOL/HDL Ratio: 3
VLDL: 25.8 mg/dL (ref 0.0–40.0)

## 2016-04-21 LAB — COMPREHENSIVE METABOLIC PANEL
ALK PHOS: 51 U/L (ref 39–117)
ALT: 21 U/L (ref 0–53)
AST: 14 U/L (ref 0–37)
Albumin: 3.8 g/dL (ref 3.5–5.2)
BILIRUBIN TOTAL: 1.3 mg/dL — AB (ref 0.2–1.2)
BUN: 18 mg/dL (ref 6–23)
CALCIUM: 9.4 mg/dL (ref 8.4–10.5)
CO2: 29 meq/L (ref 19–32)
Chloride: 101 mEq/L (ref 96–112)
Creatinine, Ser: 0.86 mg/dL (ref 0.40–1.50)
GFR: 94.63 mL/min (ref >60.00)
Glucose, Bld: 135 mg/dL — ABNORMAL HIGH (ref 70–99)
POTASSIUM: 4 meq/L (ref 3.5–5.1)
Sodium: 138 mEq/L (ref 135–145)
TOTAL PROTEIN: 6.1 g/dL (ref 6.0–8.3)

## 2016-04-21 LAB — HEMOGLOBIN A1C: Hgb A1c MFr Bld: 6.3 % (ref 4.6–6.5)

## 2016-04-21 NOTE — Patient Instructions (Signed)
GO TO THE LAB :      Get the blood work     GO TO THE FRONT DESK Schedule your next appointment for a  physical exam in one year, fasting         Fall Prevention and Home Safety Falls cause injuries and can affect all age groups. It is possible to use preventive measures to significantly decrease the likelihood of falls. There are many simple measures which can make your home safer and prevent falls. OUTDOORS  Repair cracks and edges of walkways and driveways.  Remove high doorway thresholds.  Trim shrubbery on the main path into your home.  Have good outside lighting.  Clear walkways of tools, rocks, debris, and clutter.  Check that handrails are not broken and are securely fastened. Both sides of steps should have handrails.  Have leaves, snow, and ice cleared regularly.  Use sand or salt on walkways during winter months.  In the garage, clean up grease or oil spills. BATHROOM  Install night lights.  Install grab bars by the toilet and in the tub and shower.  Use non-skid mats or decals in the tub or shower.  Place a plastic non-slip stool in the shower to sit on, if needed.  Keep floors dry and clean up all water on the floor immediately.  Remove soap buildup in the tub or shower on a regular basis.  Secure bath mats with non-slip, double-sided rug tape.  Remove throw rugs and tripping hazards from the floors. BEDROOMS  Install night lights.  Make sure a bedside light is easy to reach.  Do not use oversized bedding.  Keep a telephone by your bedside.  Have a firm chair with side arms to use for getting dressed.  Remove throw rugs and tripping hazards from the floor. KITCHEN  Keep handles on pots and pans turned toward the center of the stove. Use back burners when possible.  Clean up spills quickly and allow time for drying.  Avoid walking on wet floors.  Avoid hot utensils and knives.  Position shelves so they are not too high or  low.  Place commonly used objects within easy reach.  If necessary, use a sturdy step stool with a grab bar when reaching.  Keep electrical cables out of the way.  Do not use floor polish or wax that makes floors slippery. If you must use wax, use non-skid floor wax.  Remove throw rugs and tripping hazards from the floor. STAIRWAYS  Never leave objects on stairs.  Place handrails on both sides of stairways and use them. Fix any loose handrails. Make sure handrails on both sides of the stairways are as long as the stairs.  Check carpeting to make sure it is firmly attached along stairs. Make repairs to worn or loose carpet promptly.  Avoid placing throw rugs at the top or bottom of stairways, or properly secure the rug with carpet tape to prevent slippage. Get rid of throw rugs, if possible.  Have an electrician put in a light switch at the top and bottom of the stairs. OTHER FALL PREVENTION TIPS  Wear low-heel or rubber-soled shoes that are supportive and fit well. Wear closed toe shoes.  When using a stepladder, make sure it is fully opened and both spreaders are firmly locked. Do not climb a closed stepladder.  Add color or contrast paint or tape to grab bars and handrails in your home. Place contrasting color strips on first and last steps.  Learn and  use mobility aids as needed. Install an electrical emergency response system.  Turn on lights to avoid dark areas. Replace light bulbs that burn out immediately. Get light switches that glow.  Arrange furniture to create clear pathways. Keep furniture in the same place.  Firmly attach carpet with non-skid or double-sided tape.  Eliminate uneven floor surfaces.  Select a carpet pattern that does not visually hide the edge of steps.  Be aware of all pets. OTHER HOME SAFETY TIPS  Set the water temperature for 120 F (48.8 C).  Keep emergency numbers on or near the telephone.  Keep smoke detectors on every level of the  home and near sleeping areas. Document Released: 11/21/2002 Document Revised: 06/01/2012 Document Reviewed: 02/20/2012 [facility] Patient Information 2015 [place], [place]. This information is not intended to replace advice given to you by your health care provider. Make sure you discuss any questions you have with your health care provider.   Preventive Care for Adults Ages 43 and over  Blood pressure check.** / Every 1 to 2 years.  Lipid and cholesterol check.**/ Every 5 years beginning at age 33.  Lung cancer screening. / Every year if you are aged 79-80 years and have a 30-pack-year history of smoking and currently smoke or have quit within the past 15 years. Yearly screening is stopped once you have quit smoking for at least 15 years or develop a health problem that would prevent you from having lung cancer treatment.  Fecal occult blood test (FOBT) of stool. / Every year beginning at age 7 and continuing until age 69. You may not have to do this test if you get a colonoscopy every 10 years.  Flexible sigmoidoscopy** or colonoscopy.** / Every 5 years for a flexible sigmoidoscopy or every 10 years for a colonoscopy beginning at age 36 and continuing until age 54.  Hepatitis C blood test.** / For all people born from [birth year] through 1966 and any individual with known risks for hepatitis C.  Abdominal aortic aneurysm (AAA) screening.** / A one-time screening for ages 18 to 20 years who are current or former smokers.  Skin self-exam. / Monthly.  Influenza vaccine. / Every year.  Tetanus, diphtheria, and acellular pertussis (Tdap/Td) vaccine.** / 1 dose of Td every 10 years.  Varicella vaccine.** / Consult your health care provider.  Zoster vaccine.** / 1 dose for adults aged 12 years or older.  Pneumococcal 13-valent conjugate (PCV13) vaccine.** / Consult your health care provider.  Pneumococcal polysaccharide (PPSV23) vaccine.** / 1 dose for all adults aged 8 years and  older.  Meningococcal vaccine.** / Consult your health care provider.  Hepatitis A vaccine.** / Consult your health care provider.  Hepatitis B vaccine.** / Consult your health care provider.  Haemophilus influenzae type b (Hib) vaccine.** / Consult your health care provider. **Family history and personal history of risk and conditions may change your health care provider's recommendations. Document Released: 01/27/2002 Document Revised: 12/06/2013 Document Reviewed: 04/28/2011 [facility] Patient Information 2015 [place], [place]. This information is not intended to replace advice given to you by your health care provider. Make sure you discuss any questions you have with your health care provider.

## 2016-04-21 NOTE — Assessment & Plan Note (Signed)
Prediabetes: Has extremely healthy lifestyle, check A1c Hyperlipidemia: Continue simvastatin, check a FLP CAD, ischemic cardiomyopathy: Continue ASA, statins, BB and ARBs. Check a CMP, CBC Gout: Asx RTC one year

## 2016-04-21 NOTE — Progress Notes (Signed)
Subjective:    Patient ID: Matthew Joyce, male    DOB: October 20, 1951, 65 y.o.   MRN: [id number]  DOS:  04/21/2016 Type of visit - description :   Here for Medicare AWV: 1. Risk factors based on Past M, S, F history: reviewed 2. Physical Activities:  Very active, golf, gym daily  3. Depression/mood: neg screening  4. Hearing:  No problems noted or reported  5. ADL's: independent, drives  6. Fall Risk: no recent falls, prevention discussed , see AVS 7. home Safety: does feel safe at home  8. Height, weight, & visual acuity: see VS, sees eye doctor regulalrly 9. Counseling: provided 10. Labs ordered based on risk factors: if needed  11. Referral Coordination: if needed 12. Care Plan, see assessment and plan , written personalized plan provided , see AVS 13. Cognitive Assessment: motor skills and cognition appropriate for age 83. Care team updated  15. End-of-life care, has a HC-POA  In addition, today we discussed the following:  CAD, cardiomyopathy, ICD: Follow-up by [name] and the CHF clinic. Good med compliance Prediabetes, he has extremely healthy lifestyle. Hyperlipidemia: Good med compliance, no apparent side effects Gout: No symptoms since the last visit.  Review of Systems  Constitutional: No fever. No chills. No unexplained wt changes. No unusual sweats  HEENT: No dental problems, no ear discharge, no facial swelling, no voice changes. No eye discharge, no eye  redness , no  intolerance to light   Respiratory: No wheezing , no  difficulty breathing. No cough , no mucus production  Cardiovascular: No CP, no leg swelling , no  Palpitations  GI: no nausea, no vomiting, no diarrhea , no  abdominal pain.  No blood in the stools. No dysphagia, no odynophagia    Endocrine: No polyphagia, no polyuria , no polydipsia  GU: No dysuria, gross hematuria, difficulty urinating. No urinary urgency, no frequency.  Musculoskeletal: No joint swellings or unusual aches or  pains  Skin: No change in the color of the skin, palor , no  Rash  Allergic, immunologic: No environmental allergies , no  food allergies  Neurological: No dizziness no  syncope. No headaches. No diplopia, no slurred, no slurred speech, no motor deficits, no facial  Numbness  Hematological: No enlarged lymph nodes, no easy bruising , no unusual bleedings  Psychiatry: No suicidal ideas, no hallucinations, no beavior problems, no confusion.  No unusual/severe anxiety, no depression  Past Medical History  Diagnosis Date  . CAD (coronary artery disease)     a- S/p cabg in march 2006 by Dr.Owen;  b. myoview 5/12: large IL scar from apex to base, no ischemia, EF 37%  . Ischemic cardiomyopathy     a-Echocardiogram, Nov 2006, showed ejection fraction of 30-40% with mild to moderated mitral  regurgitation and mild aortic regurgitation b- Cardiac MRI, May 2007, EF of 44% with 50% scar involving  the inferolateral walls. No comment of mitral regurgitation. c- last echo  11/10: EF 30-35%  mod MR d- Nega T-Wave alternans testing in May of 2007 e- no ACE due to low BP;  f. CPX 3/11: normal  . Gout   . Hyperlipidemia   . DM (diabetes mellitus) ([place])   . ED (erectile dysfunction)   . Bradycardia     Use of beta blocker limited  . 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 Systolic CHF, chronic ([place])   . AICD (automatic cardioverter/defibrillator) present 6/09    St.  Jude    Past Surgical History  Procedure Laterality Date  . Tonsillectomy      as a child  . Coronary artery bypass graft  02-2005  . Orif ankle fracture  1987    left   . Shoulder arthroscopy  02/2010    rt   . Cardiac defibrillator placement  06/01/08    ICD    Social History   Social History  . Marital Status: Married    Spouse Name: N/A  . Number of Children: 2  . Years of Education: N/A   Occupational History  . 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     Social History Main Topics  . Smoking status: Former Smoker -- 0.10 packs/day    Types: Cigarettes    Quit date: 12/16/1999  . Smokeless tobacco: Never Used  . Alcohol Use: 0.0 oz/week     Comment: socially  . Drug Use: No  . Sexual Activity: Not on file   Other Topics Concern  . Not on file   Social History Narrative   Household- pt and wife   2 adult children    Family History  Problem Relation Age of Onset  . Heart attack Father 59  . Cardiomyopathy Brother   . Colon cancer Neg Hx   . Prostate cancer Neg Hx   . Diabetes Neg Hx         Medication List       This list is accurate as of: 04/21/16 11:00 AM.  Always use your most recent med list.               aspirin 81 MG tablet  Take 81 mg by mouth daily.     candesartan 4 MG tablet  Commonly known as:  ATACAND  Take 1 tablet (4 mg total) by mouth daily.     cetirizine 10 MG tablet  Commonly known as:  ZYRTEC  Take 10 mg by mouth daily.     Fish Oil Oil  Take 3,000 mg by mouth daily.     metoprolol succinate 25 MG 24 hr tablet  Commonly known as:  TOPROL-XL  1/2 tab two times a day.     nitroGLYCERIN 0.4 MG SL tablet  Commonly known as:  NITROSTAT  Place 1 tablet (0.4 mg total) under the tongue every 5 (five) minutes as needed.     simvastatin 40 MG tablet  Commonly known as:  ZOCOR  Take 1 tablet (40 mg total) by mouth at bedtime.           Objective:   Physical Exam BP 118/62 mmHg  Pulse 50  Temp(Src) 97.9 F (36.6 C) (Oral)  Ht 5\' 10"  (1.778 m)  Wt 198 lb (89.812 kg)  BMI 28.41 kg/m2  SpO2 97%  General:   Well developed, well nourished . NAD.  Neck: No  thyromegaly   HEENT:  Normocephalic . Face symmetric, atraumatic Lungs:  CTA B Normal respiratory effort, no intercostal retractions, no accessory muscle use. Heart: RRR,  no murmur.  No pretibial edema bilaterally  Abdomen:  Not distended, soft, non-tender. No rebound or rigidity.   Skin: Exposed areas  without rash. Not pale. Not jaundice Neurologic:  alert & oriented X3.  Speech normal, gait appropriate for age and unassisted Strength symmetric and appropriate for age.  Psych: Cognition and judgment appear intact.  Cooperative with normal attention span and concentration.  Behavior appropriate. No anxious or depressed appearing.  Assessment & Plan:   Assessment Prediabetes Hyperlipidemia Gout E.D. CV: --CAD, CABG 2006 --Ischemic cardiomyopathy of 30% 2006, EF 45 - 50% 08-2013 --RBBB --AICD 2009 St Jude  Plan: In addition to the Medicare physical we addressed the following: Prediabetes: Has extremely healthy lifestyle, check A1c Hyperlipidemia: Continue simvastatin, check a FLP CAD, ischemic cardiomyopathy: Continue ASA, statins, BB and ARBs. Check a CMP, CBC Gout: Asx RTC one year

## 2016-04-21 NOTE — Progress Notes (Signed)
Pre visit review using our clinic review tool, if applicable. No additional management support is needed unless otherwise documented below in the visit note. 

## 2016-04-21 NOTE — Assessment & Plan Note (Signed)
Td 2008; pneumonia shot 2009, Prevnar 2015; zostavax 2014  01-2008: Cscope, tics, no polyps, next in 10 years Prostate cancer screening: DRE- PSA wnl 2016  Diet and exercise -- doing very well with life style

## 2016-06-11 ENCOUNTER — Other Ambulatory Visit: Payer: Self-pay | Admitting: Internal Medicine

## 2016-06-19 DIAGNOSIS — L570 Actinic keratosis: Secondary | ICD-10-CM | POA: Diagnosis not present

## 2016-06-25 ENCOUNTER — Ambulatory Visit (INDEPENDENT_AMBULATORY_CARE_PROVIDER_SITE_OTHER): Payer: Medicare Other | Admitting: *Deleted

## 2016-06-25 ENCOUNTER — Encounter: Payer: Self-pay | Admitting: Internal Medicine

## 2016-06-25 DIAGNOSIS — Z95810 Presence of automatic (implantable) cardiac defibrillator: Secondary | ICD-10-CM

## 2016-06-26 ENCOUNTER — Telehealth: Payer: Self-pay | Admitting: *Deleted

## 2016-06-26 NOTE — Telephone Encounter (Signed)
Reviewed episode with [name]. No further recommendations. Mr. Gullo made aware. Next transmission 09/25/16.

## 2016-06-26 NOTE — Telephone Encounter (Signed)
Spoke with Matthew Joyce. I made him aware of VF episode 06/09/16 at 7:30am. He reports that he was home and he does not recall the episode at all. I am not sure as to why we have just now received this alert, his monitor has been plugged in and he hasn't been out of town. He verbalizes that he is taking his 12.5mg  of toprol xl twice daily. I made him aware of driving restrictions x 6 months. I will review this episode with [name] and call [name] back this afternoon. He is agreeable with plan

## 2016-06-27 ENCOUNTER — Encounter: Payer: Self-pay | Admitting: Cardiology

## 2016-06-27 NOTE — Progress Notes (Signed)
Remote ICD transmission.   

## 2016-07-01 LAB — CUP PACEART REMOTE DEVICE CHECK
Battery Voltage: 2.56 V
Brady Statistic RV Percent Paced: 1 %
HighPow Impedance: 55 Ohm
Implantable Lead Location: 753860
Lead Channel Pacing Threshold Amplitude: 1.5 V
Lead Channel Pacing Threshold Pulse Width: 0.8 ms
Lead Channel Setting Pacing Amplitude: 3 V
Lead Channel Setting Pacing Pulse Width: 0.8 ms
Lead Channel Setting Sensing Sensitivity: 0.3 mV
MDC IDC LEAD IMPLANT DT: 20090618
MDC IDC LEAD MODEL: 7121
MDC IDC MSMT BATTERY REMAINING LONGEVITY: 25 mo
MDC IDC MSMT LEADCHNL RV IMPEDANCE VALUE: 340 Ohm
MDC IDC MSMT LEADCHNL RV SENSING INTR AMPL: 6.4 mV
MDC IDC SESS DTM: 20170713012250
Pulse Gen Serial Number: 550952

## 2016-08-01 ENCOUNTER — Telehealth (HOSPITAL_COMMUNITY): Payer: Self-pay | Admitting: Vascular Surgery

## 2016-08-01 ENCOUNTER — Ambulatory Visit (HOSPITAL_COMMUNITY)
Admission: RE | Admit: 2016-08-01 | Discharge: 2016-08-01 | Disposition: A | Discharge status: Home / Self Care | Payer: Medicare Other | Source: Ambulatory Visit | Attending: Internal Medicine | Admitting: Internal Medicine

## 2016-08-01 ENCOUNTER — Encounter: Payer: Self-pay | Admitting: Internal Medicine

## 2016-08-01 VITALS — BP 112/68 | HR 61 | Wt 195.8 lb

## 2016-08-01 DIAGNOSIS — Z951 Presence of aortocoronary bypass graft: Secondary | ICD-10-CM | POA: Insufficient documentation

## 2016-08-01 DIAGNOSIS — E785 Hyperlipidemia, unspecified: Secondary | ICD-10-CM | POA: Insufficient documentation

## 2016-08-01 DIAGNOSIS — I4510 Unspecified right bundle-branch block: Secondary | ICD-10-CM | POA: Diagnosis not present

## 2016-08-01 DIAGNOSIS — Z79899 Other long term (current) drug therapy: Secondary | ICD-10-CM | POA: Diagnosis not present

## 2016-08-01 DIAGNOSIS — Z882 Allergy status to sulfonamides status: Secondary | ICD-10-CM | POA: Insufficient documentation

## 2016-08-01 DIAGNOSIS — I472 Ventricular tachycardia, unspecified: Secondary | ICD-10-CM

## 2016-08-01 DIAGNOSIS — I255 Ischemic cardiomyopathy: Secondary | ICD-10-CM | POA: Insufficient documentation

## 2016-08-01 DIAGNOSIS — Z7982 Long term (current) use of aspirin: Secondary | ICD-10-CM | POA: Diagnosis not present

## 2016-08-01 DIAGNOSIS — I5022 Chronic systolic (congestive) heart failure: Secondary | ICD-10-CM | POA: Diagnosis not present

## 2016-08-01 DIAGNOSIS — Z95810 Presence of automatic (implantable) cardiac defibrillator: Secondary | ICD-10-CM | POA: Diagnosis not present

## 2016-08-01 DIAGNOSIS — I2510 Atherosclerotic heart disease of native coronary artery without angina pectoris: Secondary | ICD-10-CM | POA: Insufficient documentation

## 2016-08-01 DIAGNOSIS — E119 Type 2 diabetes mellitus without complications: Secondary | ICD-10-CM | POA: Diagnosis not present

## 2016-08-01 DIAGNOSIS — Z888 Allergy status to other drugs, medicaments and biological substances status: Secondary | ICD-10-CM | POA: Insufficient documentation

## 2016-08-01 LAB — COMPREHENSIVE METABOLIC PANEL WITH GFR
ALT: 21 U/L (ref 17–63)
AST: 21 U/L (ref 15–41)
Albumin: 3.8 g/dL (ref 3.5–5.0)
Alkaline Phosphatase: 53 U/L (ref 38–126)
Anion gap: 4 — ABNORMAL LOW (ref 5–15)
BUN: 17 mg/dL (ref 6–20)
CO2: 26 mmol/L (ref 22–32)
Calcium: 9.7 mg/dL (ref 8.9–10.3)
Chloride: 107 mmol/L (ref 101–111)
Creatinine, Ser: 0.87 mg/dL (ref 0.61–1.24)
GFR calc Af Amer: >60 mL/min
GFR calc non Af Amer: >60 mL/min
Glucose, Bld: 124 mg/dL — ABNORMAL HIGH (ref 65–99)
Potassium: 4.3 mmol/L (ref 3.5–5.1)
Sodium: 137 mmol/L (ref 135–145)
Total Bilirubin: 2.5 mg/dL — ABNORMAL HIGH (ref 0.3–1.2)
Total Protein: 6.4 g/dL — ABNORMAL LOW (ref 6.5–8.1)

## 2016-08-01 LAB — MAGNESIUM: MAGNESIUM: 2.2 mg/dL (ref 1.7–2.4)

## 2016-08-01 NOTE — Telephone Encounter (Signed)
Sent Cherie Dark message to call pt schedule echo and stress test

## 2016-08-01 NOTE — Patient Instructions (Signed)
Your physician has requested that you have an echocardiogram. Echocardiography is a painless test that uses sound waves to create images of your heart. It provides your doctor with information about the size and shape of your heart and how well your heart's chambers and valves are working. This procedure takes approximately one hour. There are no restrictions for this procedure.  Your physician has requested that you have en exercise stress myoview. For further information please visit [email or web address]. Please follow instruction sheet, as given.  We will contact you in 6 months to schedule your next appointment.  

## 2016-08-01 NOTE — Progress Notes (Signed)
Patient ID: Matthew Joyce, male   DOB: Mar 10, 1951, 65 y.o.   MRN: [id number] History of Present Illness: Primary Cardiologist:  Dr. Glori Bickers Primary Electrophysiologist:  Dr. Cristopher Peru PCP; Dr Clent Jacks is a 65 y.o. male with a history of a CAD S/P CABG in 2006, ICD, ischemic MR, DM, hyperlipidemia, and bradycardia.  B-blocker dosing limited by bradycardia. Had Myoview 5/12 with  an old scar on his images but no ischemia with EF of 36%.   He returns for follow up today.  He has not been seen since 2015. He has been ok but on June 26th ICD fired with VF noted by ST Jude.  Overall feeling ok. Denies SOB/PND/Orthopnea. Playing golf 5 days a week.  Compliant with all his medications. Retired since 2012.   Echo 07/2012: EF ~30%.  Grade 2 diastolic dysfunction.  Mod MR.  Mild to mod dilated LA.  Mildly dilated RV with mildly reduced systolic function.   ECHO 08/2013 EF 45-50% Grade II DD Moderate MR  Labs 04/21/2016: K 4.0 Creatinine 0.86.  Labs 10/13/13 K 4.6 Creatinine 0.7  Lab Results  Component Value Date   CHOL 164 04/21/2016   HDL 50.40 04/21/2016   LDLCALC 88 04/21/2016   TRIG 129.0 04/21/2016   CHOLHDL 3 04/21/2016    No results found for: LDLDIRECT  Past Medical History:  Diagnosis Date  . AICD (automatic cardioverter/defibrillator) present 6/09   St[employer] Jude  . Bradycardia    Use of beta blocker limited  . CAD (coronary artery disease)    a- S/p cabg in march 2006 by Dr.Owen;  b. myoview 5/12: large IL scar from apex to base, no ischemia, EF 37%  . DM (diabetes mellitus) ([place])   . ED (erectile dysfunction)   . Gout   . Hyperlipidemia   . Ischemic cardiomyopathy    a-Echocardiogram, Nov 2006, showed ejection fraction of 30-40% with mild to moderated mitral  regurgitation and mild aortic regurgitation b- Cardiac MRI, May 2007, EF of 44% with 50% scar involving  the inferolateral walls. No comment of mitral regurgitation. c- last echo  11/10: EF 30-35%  mod MR d- Nega  T-Wave alternans testing in May of 2007 e- no ACE due to low BP;  f. CPX 3/11: normal  . 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 Systolic CHF, chronic ([place])     Current Outpatient Prescriptions  Medication Sig Dispense Refill  . aspirin 81 MG tablet Take 81 mg by mouth daily.      . candesartan (ATACAND) 4 MG tablet TAKE ONE TABLET BY MOUTH EVERY DAY 90 tablet 0  . cetirizine (ZYRTEC) 10 MG tablet Take 10 mg by mouth daily.      . Fish Oil OIL Take 3,000 mg by mouth daily.     . metoprolol succinate (TOPROL-XL) 25 MG 24 hr tablet TAKE 1/2 TABLET 2 TIMES A DAY 90 tablet 0  . nitroGLYCERIN (NITROSTAT) 0.4 MG SL tablet Place 1 tablet (0.4 mg total) under the tongue every 5 (five) minutes as needed. 25 tablet 2  . simvastatin (ZOCOR) 40 MG tablet TAKE ONE TABLET BY MOUTH AT BEDTIME 90 tablet 0   No current facility-administered medications for this encounter.     Allergies  Allergen Reactions  . Atorvastatin     REACTION: myalgia  . Sulfonamide Derivatives     Rash and hot flashes  . Tape Other (See Comments)    Rips skin- use PAPER  tape  . Sulfamethoxazole Rash    Pt turns red   Vital Signs: BP 112/68 (BP Location: Left Arm, Patient Position: Sitting, Cuff Size: Normal)   Pulse 61   Wt 195 lb 12 oz (88.8 kg)   SpO2 99%   BMI 28.09 kg/m   PHYSICAL EXAM: Well nourished, well developed, in no acute distress  HEENT: normal  Neck: no JVD Cardiac:  normal S1, S2; RRR; no murmur  Lungs:  clear to auscultation bilaterally, no wheezing, rhonchi or rales  Abd: soft, nontender, no hepatomegaly  Ext: no edema  Skin: warm and dry  Neuro:  CNs 2-12 intact, no focal abnormalities noted Psych: Normal affect    EKG: Sinus Brady 55 bpm with PVCs.    ASSESSMENT AND PLAN:   1. Chronic Systolic Heart Failure ICM ECHO 08/2013 EF 45-50%  Has St Jude ICD 2009 NYHA I. Volume status stable. Continue TOPROL XL 12.5 mg twice a day. Unable to move up with  heart rate 55 bpm.  Continue Atacand 4 mg daily.  2. CAD S/P CABG 2006 No evidence of ischemia. Continue aspirin and simvastatin 3. Hyperlipidemia Continue simvastatin 40 mg at bed time. Cholesterol followed by PCP 4. RBBB 5. June 26th  - ICD discharged for V fib. Instructed not to drive for 6 months. Device interrogated with one episode of VT/VF. Does have PVCs. Check BMET and Mag now. Set up with Myoview. Hold off on cath for now.   Follow up in 6 months.   [name] NP-C 9:59 AM  Patient seen and examined with [name], NP. We discussed all aspects of the encounter. I agree with the assessment and plan as stated above.   Overall doing well but had episode of VT/VF resuscitated with ICD shock. No anginal symptoms (did have angina prior to CABG). Discussed cath vs Myoview to exclude ischemia. Given lack of ischemic symptoms currently will proceed with Myoview. Check labs. Repeat echo. ICD interrogated personally and discussed with [name] by phone. No driving x 6 months.   [name],MD 10:55 PM

## 2016-08-14 ENCOUNTER — Ambulatory Visit (HOSPITAL_COMMUNITY): Payer: Medicare Other | Attending: Cardiology

## 2016-08-14 ENCOUNTER — Ambulatory Visit (HOSPITAL_BASED_OUTPATIENT_CLINIC_OR_DEPARTMENT_OTHER): Payer: Medicare Other

## 2016-08-14 ENCOUNTER — Other Ambulatory Visit: Payer: Self-pay

## 2016-08-14 DIAGNOSIS — I110 Hypertensive heart disease with heart failure: Secondary | ICD-10-CM | POA: Insufficient documentation

## 2016-08-14 DIAGNOSIS — I472 Ventricular tachycardia, unspecified: Secondary | ICD-10-CM

## 2016-08-14 DIAGNOSIS — R9439 Abnormal result of other cardiovascular function study: Secondary | ICD-10-CM | POA: Insufficient documentation

## 2016-08-14 DIAGNOSIS — I272 Other secondary pulmonary hypertension: Secondary | ICD-10-CM | POA: Diagnosis not present

## 2016-08-14 DIAGNOSIS — I5022 Chronic systolic (congestive) heart failure: Secondary | ICD-10-CM

## 2016-08-14 DIAGNOSIS — R079 Chest pain, unspecified: Secondary | ICD-10-CM | POA: Insufficient documentation

## 2016-08-14 DIAGNOSIS — I340 Nonrheumatic mitral (valve) insufficiency: Secondary | ICD-10-CM | POA: Diagnosis not present

## 2016-08-14 DIAGNOSIS — Z95810 Presence of automatic (implantable) cardiac defibrillator: Secondary | ICD-10-CM | POA: Insufficient documentation

## 2016-08-14 DIAGNOSIS — Z951 Presence of aortocoronary bypass graft: Secondary | ICD-10-CM | POA: Diagnosis not present

## 2016-08-14 DIAGNOSIS — I2510 Atherosclerotic heart disease of native coronary artery without angina pectoris: Secondary | ICD-10-CM | POA: Insufficient documentation

## 2016-08-14 DIAGNOSIS — I509 Heart failure, unspecified: Secondary | ICD-10-CM | POA: Diagnosis present

## 2016-08-14 LAB — MYOCARDIAL PERFUSION IMAGING
CHL CUP RESTING HR STRESS: 49 {beats}/min
Estimated workload: 11.7 METS
Exercise duration (min): 10 min
Exercise duration (sec): 0 s
LHR: 0.23
LV sys vol: 189 mL
LVDIAVOL: 275 mL (ref 62–150)
MPHR: 154 {beats}/min
Peak HR: 144 {beats}/min
Percent HR: 93 %
SDS: 6
SRS: 14
SSS: 20
TID: 1.04

## 2016-08-14 LAB — ECHOCARDIOGRAM COMPLETE
HEIGHTINCHES: 69.5 in
Weight: 3120 oz

## 2016-08-14 MED ORDER — TECHNETIUM TC 99M TETROFOSMIN IV KIT
31.9000 | PACK | Freq: Once | INTRAVENOUS | Status: AC | PRN
  Administered 2016-08-14: 31.9 via INTRAVENOUS
  Filled 2016-08-14: qty 32

## 2016-08-14 MED ORDER — TECHNETIUM TC 99M TETROFOSMIN IV KIT
10.8000 | PACK | Freq: Once | INTRAVENOUS | Status: AC | PRN
  Administered 2016-08-14: 11 via INTRAVENOUS
  Filled 2016-08-14: qty 11

## 2016-09-08 ENCOUNTER — Other Ambulatory Visit (HOSPITAL_COMMUNITY): Payer: Self-pay | Admitting: Internal Medicine

## 2016-09-15 DIAGNOSIS — L570 Actinic keratosis: Secondary | ICD-10-CM | POA: Diagnosis not present

## 2016-09-25 ENCOUNTER — Ambulatory Visit (INDEPENDENT_AMBULATORY_CARE_PROVIDER_SITE_OTHER): Payer: Medicare Other | Admitting: *Deleted

## 2016-09-25 DIAGNOSIS — I472 Ventricular tachycardia, unspecified: Secondary | ICD-10-CM

## 2016-09-25 NOTE — Progress Notes (Signed)
Remote ICD transmission.   

## 2016-09-26 ENCOUNTER — Encounter: Payer: Self-pay | Admitting: Cardiology

## 2016-10-16 ENCOUNTER — Encounter: Payer: Self-pay | Admitting: *Deleted

## 2016-10-27 LAB — CUP PACEART REMOTE DEVICE CHECK
Battery Remaining Longevity: 10 mo
Battery Voltage: 2.53 V
Brady Statistic RV Percent Paced: 1 %
HIGH POWER IMPEDANCE MEASURED VALUE: 50 Ohm
Implantable Lead Implant Date: 20090618
Implantable Lead Model: 7121
Lead Channel Impedance Value: 330 Ohm
Lead Channel Pacing Threshold Amplitude: 2 V
Lead Channel Setting Pacing Amplitude: 3.5 V
Lead Channel Setting Sensing Sensitivity: 0.3 mV
MDC IDC LEAD LOCATION: 753860
MDC IDC MSMT LEADCHNL RV PACING THRESHOLD PULSEWIDTH: 0.8 ms
MDC IDC MSMT LEADCHNL RV SENSING INTR AMPL: 6.6 mV
MDC IDC PG IMPLANT DT: 20090618
MDC IDC PG SERIAL: 550952
MDC IDC SESS DTM: 20171012075239
MDC IDC SET LEADCHNL RV PACING PULSEWIDTH: 0.8 ms

## 2016-10-27 NOTE — Progress Notes (Signed)
Normal remote reviewed. Nearing ERI.  Next follow up 10/31/16 with GT in clinic

## 2016-10-31 ENCOUNTER — Encounter: Payer: Self-pay | Admitting: Internal Medicine

## 2016-10-31 ENCOUNTER — Ambulatory Visit (INDEPENDENT_AMBULATORY_CARE_PROVIDER_SITE_OTHER): Payer: Medicare Other | Admitting: Internal Medicine

## 2016-10-31 VITALS — BP 94/60 | HR 59 | Ht 69.5 in | Wt 197.2 lb

## 2016-10-31 DIAGNOSIS — Z95810 Presence of automatic (implantable) cardiac defibrillator: Secondary | ICD-10-CM | POA: Diagnosis not present

## 2016-10-31 DIAGNOSIS — I25709 Atherosclerosis of coronary artery bypass graft(s), unspecified, with unspecified angina pectoris: Secondary | ICD-10-CM | POA: Diagnosis not present

## 2016-10-31 DIAGNOSIS — I080 Rheumatic disorders of both mitral and aortic valves: Secondary | ICD-10-CM | POA: Diagnosis not present

## 2016-10-31 LAB — CUP PACEART INCLINIC DEVICE CHECK
Brady Statistic RV Percent Paced: 0.05 %
HighPow Impedance: 53.1173
Implantable Lead Model: 7121
Lead Channel Pacing Threshold Amplitude: 1.75 V
Lead Channel Sensing Intrinsic Amplitude: 6.8 mV
Lead Channel Setting Pacing Amplitude: 3.5 V
Lead Channel Setting Pacing Pulse Width: 0.8 ms
Lead Channel Setting Sensing Sensitivity: 0.3 mV
MDC IDC LEAD IMPLANT DT: 20090618
MDC IDC LEAD LOCATION: 753860
MDC IDC MSMT BATTERY VOLTAGE: 2.51 V
MDC IDC MSMT LEADCHNL RV IMPEDANCE VALUE: 337.5 Ohm
MDC IDC MSMT LEADCHNL RV PACING THRESHOLD PULSEWIDTH: 0.8 ms
MDC IDC PG IMPLANT DT: 20090618
MDC IDC PG SERIAL: 550952
MDC IDC SESS DTM: 20171117091102

## 2016-10-31 NOTE — Patient Instructions (Signed)
Medication Instructions:  Your physician recommends that you continue on your current medications as directed. Please refer to the Current Medication list given to you today.   Labwork: None Ordered   Testing/Procedures: None Ordered   Follow-Up: Your physician wants you to follow-up in: 1 year with [name]. You will receive a reminder letter in the mail two months in advance. If you don't receive a letter, please call our office to schedule the follow-up appointment.  Remote monitoring is used to monitor your ICD from home. This monitoring reduces the number of office visits required to check your device to one time per year. It allows [place] to keep an eye on the functioning of your device to ensure it is working properly. You are scheduled for a device check from home on 02/03/16. You may send your transmission at any time that day. If you have a wireless device, the transmission will be sent automatically. After your physician reviews your transmission, you will receive a postcard with your next transmission date.    Any Other Special Instructions Will Be Listed Below (If Applicable).     If you need a refill on your cardiac medications before your next appointment, please call your pharmacy.

## 2016-10-31 NOTE — Progress Notes (Signed)
HPI Matthew Joyce returns today for followup. He is a pleasant middle aged man with a h/o ICM, chronic class 1-2 CHF, and HTN. He is s/p ICD implant. In the interim, he has done well. He denies chest pain, shortness of breath, peripheral edema, or syncope. No ICD shocks since his shock in June. 2D echo and stress myoview were unchanged. In the interim he feels well and enjoys retirement. He still plays golf regularly. Allergies  Allergen Reactions  . Atorvastatin     REACTION: myalgia  . Sulfonamide Derivatives     Rash and hot flashes  . Tape Other (See Comments)    Rips skin- use PAPER tape  . Sulfamethoxazole Rash    Pt turns red     Current Outpatient Prescriptions  Medication Sig Dispense Refill  . aspirin 81 MG tablet Take 81 mg by mouth daily.      . candesartan (ATACAND) 4 MG tablet TAKE ONE TABLET BY MOUTH EVERY DAY 90 tablet 3  . cetirizine (ZYRTEC) 10 MG tablet Take 10 mg by mouth daily.      . Fish Oil OIL Take 3,000 mg by mouth daily.     . metoprolol succinate (TOPROL-XL) 25 MG 24 hr tablet Take 12.5 mg by mouth 2 (two) times daily.    . nitroGLYCERIN (NITROSTAT) 0.4 MG SL tablet Place 1 tablet (0.4 mg total) under the tongue every 5 (five) minutes as needed. 25 tablet 2  . simvastatin (ZOCOR) 40 MG tablet TAKE ONE TABLET BY MOUTH AT BEDTIME 90 tablet 3   No current facility-administered medications for this visit.      Past Medical History:  Diagnosis Date  . AICD (automatic cardioverter/defibrillator) present 6/09   St. Jude  . Bradycardia    Use of beta blocker limited  . CAD (coronary artery disease)    a- S/p cabg in march 2006 by Dr.Owen;  b. myoview 5/12: large IL scar from apex to base, no ischemia, EF 37%  . DM (diabetes mellitus) ([place])   . ED (erectile dysfunction)   . Gout   . Hyperlipidemia   . Ischemic cardiomyopathy    a-Echocardiogram, Nov 2006, showed ejection fraction of 30-40% with mild to moderated mitral  regurgitation and mild aortic  regurgitation b- Cardiac MRI, May 2007, EF of 44% with 50% scar involving  the inferolateral walls. No comment of mitral regurgitation. c- last echo  11/10: EF 30-35%  mod MR d- Nega T-Wave alternans testing in May of 2007 e- no ACE due to low BP;  f. CPX 3/11: normal  . 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 Systolic CHF, chronic (HCC)     ROS:   All systems reviewed and negative except as noted in the HPI.   Past Surgical History:  Procedure Laterality Date  . CARDIAC DEFIBRILLATOR PLACEMENT  06/01/08   ICD  . CORONARY ARTERY BYPASS GRAFT  02-2005  . ORIF ANKLE FRACTURE  1987   left   . SHOULDER ARTHROSCOPY  02/2010   rt   . TONSILLECTOMY     as a child     Family History  Problem Relation Age of Onset  . Heart attack Father 66  . Cardiomyopathy Brother   . Colon cancer Neg Hx   . Prostate cancer Neg Hx   . Diabetes Neg Hx      Social History   Social History  . Marital status: Married    Spouse name: N/A  .  Number of children: 2  . Years of education: N/A   Occupational History  . 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  [place] History Main Topics  . Smoking status: Former Smoker    Packs/day: 0.10    Types: Cigarettes    Quit date: 12/16/1999  . Smokeless tobacco: Never Used  . Alcohol use 0.0 oz/week     Comment: socially  . Drug use: No  . Sexual activity: Not on file   Other Topics Concern  . Not on file   Social History Narrative   Household- pt and wife   2 adult children      BP 94/60   Pulse (!) 59   Ht 5' 9.5" (1.765 m)   Wt 197 lb 3.2 oz (89.4 kg)   SpO2 98%   BMI 28.70 kg/m   Physical Exam:  Well appearing middle-aged man, NAD HEENT: Unremarkable Neck:  6 cm JVD, no thyromegally Lungs:  Clear with no wheezes, rales, or rhonchi. Well-healed ICD incision. HEART:  Regular rate rhythm, no murmurs, no rubs, no clicks Abd:  soft, positive  bowel sounds, no organomegally, no rebound, no guarding Ext:  2 plus pulses, no edema, no cyanosis, no clubbing Skin:  No rashes no nodules Neuro:  CN II through XII intact, motor grossly intact  DEVICE  Normal device function.  See PaceArt for details.   Assess/Plan:  1. VF - he has had no recurrent episodes. He will continue his current meds. 2. ICM - his symptoms are quiet. He remains active exercising. No anginal symptoms. 3. Chronic systolic heart failure - he is class 1-2. He will continue his current meds. 4. ICD - his St. Jude device is working normally. He is approaching ERI.   [name].D.

## 2016-11-25 ENCOUNTER — Ambulatory Visit (INDEPENDENT_AMBULATORY_CARE_PROVIDER_SITE_OTHER): Payer: Medicare Other

## 2016-11-25 DIAGNOSIS — Z23 Encounter for immunization: Secondary | ICD-10-CM

## 2016-11-26 DIAGNOSIS — L810 Postinflammatory hyperpigmentation: Secondary | ICD-10-CM | POA: Diagnosis not present

## 2016-11-26 DIAGNOSIS — L820 Inflamed seborrheic keratosis: Secondary | ICD-10-CM | POA: Diagnosis not present

## 2017-01-16 ENCOUNTER — Telehealth: Payer: Self-pay

## 2017-01-16 NOTE — Telephone Encounter (Signed)
Called pt to let him know that his device had reached ERI and a scheduler would be calling to make an apt with [name] to discuss generator change. Pt voiced understanding,

## 2017-01-23 ENCOUNTER — Encounter: Payer: Self-pay | Admitting: Internal Medicine

## 2017-01-23 ENCOUNTER — Ambulatory Visit (INDEPENDENT_AMBULATORY_CARE_PROVIDER_SITE_OTHER): Payer: Medicare Other | Admitting: Internal Medicine

## 2017-01-23 VITALS — BP 126/68 | HR 60 | Ht 69.0 in | Wt 195.0 lb

## 2017-01-23 DIAGNOSIS — I25810 Atherosclerosis of coronary artery bypass graft(s) without angina pectoris: Secondary | ICD-10-CM | POA: Diagnosis not present

## 2017-01-23 DIAGNOSIS — I340 Nonrheumatic mitral (valve) insufficiency: Secondary | ICD-10-CM | POA: Diagnosis not present

## 2017-01-23 DIAGNOSIS — Z95810 Presence of automatic (implantable) cardiac defibrillator: Secondary | ICD-10-CM | POA: Diagnosis not present

## 2017-01-23 DIAGNOSIS — Z01812 Encounter for preprocedural laboratory examination: Secondary | ICD-10-CM | POA: Diagnosis not present

## 2017-01-23 DIAGNOSIS — I5022 Chronic systolic (congestive) heart failure: Secondary | ICD-10-CM | POA: Diagnosis not present

## 2017-01-23 LAB — BASIC METABOLIC PANEL
BUN / CREAT RATIO: 17 (ref 10–24)
BUN: 12 mg/dL (ref 8–27)
CO2: 22 mmol/L (ref 18–29)
CREATININE: 0.71 mg/dL — AB (ref 0.76–1.27)
Calcium: 9.8 mg/dL (ref 8.6–10.2)
Chloride: 99 mmol/L (ref 96–106)
GFR calc non Af Amer: 98 mL/min/{1.73_m2} (ref >59)
GFR, EST AFRICAN AMERICAN: 113 mL/min/{1.73_m2} (ref >59)
Glucose: 111 mg/dL — ABNORMAL HIGH (ref 65–99)
Potassium: 4.8 mmol/L (ref 3.5–5.2)
Sodium: 140 mmol/L (ref 134–144)

## 2017-01-23 LAB — CBC WITH DIFFERENTIAL/PLATELET
BASOS: 1 %
Basophils Absolute: 0.1 10*3/uL (ref 0.0–0.2)
EOS (ABSOLUTE): 0.3 10*3/uL (ref 0.0–0.4)
EOS: 4 %
HEMATOCRIT: 43.4 % (ref 37.5–51.0)
HEMOGLOBIN: 14.7 g/dL (ref 13.0–17.7)
Immature Grans (Abs): 0 10*3/uL (ref 0.0–0.1)
Immature Granulocytes: 0 %
LYMPHS ABS: 2.1 10*3/uL (ref 0.7–3.1)
Lymphs: 29 %
MCH: 30.2 pg (ref 26.6–33.0)
MCHC: 33.9 g/dL (ref 31.5–35.7)
MCV: 89 fL (ref 79–97)
MONOCYTES: 14 %
Monocytes Absolute: 1.1 10*3/uL — ABNORMAL HIGH (ref 0.1–0.9)
NEUTROS ABS: 3.9 10*3/uL (ref 1.4–7.0)
Neutrophils: 52 %
Platelets: 254 10*3/uL (ref 150–379)
RBC: 4.87 x10E6/uL (ref 4.14–5.80)
RDW: 13 % (ref 12.3–15.4)
WBC: 7.4 10*3/uL (ref 3.4–10.8)

## 2017-01-23 NOTE — Progress Notes (Signed)
HPI Mr. Fickett returns today for followup. He is a pleasant middle aged man with a h/o ICM, chronic class 1-2 CHF, and HTN. He is s/p ICD implant. In the interim, he has done well. He denies chest pain, shortness of breath, peripheral edema, or syncope. No ICD shocks since his shock in June. 2D echo and stress myoview were unchanged. EF is 30%. In the interim he has reached ERI. Allergies  Allergen Reactions  . Atorvastatin     REACTION: myalgia  . Sulfonamide Derivatives     Rash and hot flashes  . Tape Other (See Comments)    Rips skin- use PAPER tape  . Sulfamethoxazole Rash    Pt turns red     Current Outpatient Prescriptions  Medication Sig Dispense Refill  . aspirin 81 MG tablet Take 81 mg by mouth daily.      . candesartan (ATACAND) 4 MG tablet TAKE ONE TABLET BY MOUTH EVERY DAY 90 tablet 3  . cetirizine (ZYRTEC) 10 MG tablet Take 10 mg by mouth daily.      . Fish Oil OIL Take 3,000 mg by mouth daily.     . metoprolol succinate (TOPROL-XL) 25 MG 24 hr tablet Take 12.5 mg by mouth 2 (two) times daily.    . nitroGLYCERIN (NITROSTAT) 0.4 MG SL tablet Place 0.4 mg under the tongue every 5 (five) minutes as needed for chest pain (MAX 3 TABLETS).    Marland Kitchen simvastatin (ZOCOR) 40 MG tablet TAKE ONE TABLET BY MOUTH AT BEDTIME 90 tablet 3   No current facility-administered medications for this visit.      Past Medical History:  Diagnosis Date  . AICD (automatic cardioverter/defibrillator) present 6/09   St. Jude  . Bradycardia    Use of beta blocker limited  . CAD (coronary artery disease)    a- S/p cabg in march 2006 by Dr.Owen;  b. myoview 5/12: large IL scar from apex to base, no ischemia, EF 37%  . DM (diabetes mellitus) ([place])   . ED (erectile dysfunction)   . Gout   . Hyperlipidemia   . Ischemic cardiomyopathy    a-Echocardiogram, Nov 2006, showed ejection fraction of 30-40% with mild to moderated mitral  regurgitation and mild aortic regurgitation b- Cardiac MRI, May 2007,  EF of 44% with 50% scar involving  the inferolateral walls. No comment of mitral regurgitation. c- last echo  11/10: EF 30-35%  mod MR d- Nega T-Wave alternans testing in May of 2007 e- no ACE due to low BP;  f. CPX 3/11: normal  . 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 Systolic CHF, chronic (HCC)     ROS:   All systems reviewed and negative except as noted in the HPI.   Past Surgical History:  Procedure Laterality Date  . CARDIAC DEFIBRILLATOR PLACEMENT  06/01/08   ICD  . CORONARY ARTERY BYPASS GRAFT  02-2005  . ORIF ANKLE FRACTURE  1987   left   . SHOULDER ARTHROSCOPY  02/2010   rt   . TONSILLECTOMY     as a child     Family History  Problem Relation Age of Onset  . Heart attack Father 20  . Cardiomyopathy Brother   . Colon cancer Neg Hx   . Prostate cancer Neg Hx   . Diabetes Neg Hx      Social History   Social History  . Marital status: Married    Spouse name: N/A  . Number of  children: 2  . Years of education: N/A   Occupational History  . 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  [place] History Main Topics  . Smoking status: Former Smoker    Packs/day: 0.10    Types: Cigarettes    Quit date: 12/16/1999  . Smokeless tobacco: Never Used  . Alcohol use 0.0 oz/week     Comment: socially  . Drug use: No  . Sexual activity: Not on file   Other Topics Concern  . Not on file   Social History Narrative   Household- pt and wife   2 adult children      BP 126/68 (BP Location: Left Arm)   Pulse 60   Ht 5\' 9"  (1.753 m)   Wt 195 lb (88.5 kg)   BMI 28.80 kg/m   Physical Exam:  Well appearing middle-aged man, NAD HEENT: Unremarkable Neck:  6 cm JVD, no thyromegally Lungs:  Clear with no wheezes, rales, or rhonchi. Well-healed ICD incision. HEART:  Regular rate rhythm, no murmurs, no rubs, no clicks Abd:  soft, positive bowel sounds, no organomegally, no  rebound, no guarding Ext:  2 plus pulses, no edema, no cyanosis, no clubbing Skin:  No rashes no nodules Neuro:  CN II through XII intact, motor grossly intact  DEVICE  Normal device function.  See PaceArt for details.   Assess/Plan:  1. VF - he has had no recurrent episodes. He will continue his current meds. 2. ICM - his symptoms are quiet. He remains active exercising. No anginal symptoms. 3. Chronic systolic heart failure - he is class 1-2. He will continue his current meds. 4. ICD - his St. Jude device is working normally. He has reached ERI and will plan to schedule him to undergo ICD system change out.  Anahid Eskelson,M.D.  [name].D.

## 2017-01-23 NOTE — Patient Instructions (Addendum)
Medication Instructions:  Your physician recommends that you continue on your current medications as directed. Please refer to the Current Medication list given to you today.   Labwork: TODAY: BMET / CBC w/ diff  Testing/Procedures: Generator Change  Follow-Up: Follow-up for a wound check in the [facility] 10-14 days from date 02/03/17  and with [name] 91 days after 02/03/17 .    Any Other Special Instructions Will Be Listed Below ---  Please wash with the CHG Soap the night before and morning of procedure (follow instruction page "Preparing For Surgery").   Report to the [facility] on 02/03/17 at 7:30 AM  Nothing to eat or drink after midnight the night before procedure  Do not take any medication morning of procedure  You will need someone to drive you home after the procedure    If you need a refill on your cardiac medications before your next appointment, please call your pharmacy.

## 2017-02-03 ENCOUNTER — Ambulatory Visit (HOSPITAL_COMMUNITY)
Admission: RE | Admit: 2017-02-03 | Discharge: 2017-02-03 | Disposition: A | Discharge status: Home / Self Care | Payer: Medicare Other | Source: Ambulatory Visit | Attending: Internal Medicine | Admitting: Internal Medicine

## 2017-02-03 ENCOUNTER — Encounter (HOSPITAL_COMMUNITY): Payer: Self-pay | Admitting: Internal Medicine

## 2017-02-03 ENCOUNTER — Encounter (HOSPITAL_COMMUNITY)
Admission: RE | Disposition: A | Discharge status: Home / Self Care | Payer: Self-pay | Source: Ambulatory Visit | Attending: Internal Medicine

## 2017-02-03 DIAGNOSIS — I252 Old myocardial infarction: Secondary | ICD-10-CM | POA: Diagnosis not present

## 2017-02-03 DIAGNOSIS — Z4502 Encounter for adjustment and management of automatic implantable cardiac defibrillator: Secondary | ICD-10-CM | POA: Insufficient documentation

## 2017-02-03 DIAGNOSIS — I255 Ischemic cardiomyopathy: Secondary | ICD-10-CM | POA: Insufficient documentation

## 2017-02-03 DIAGNOSIS — I472 Ventricular tachycardia: Secondary | ICD-10-CM | POA: Insufficient documentation

## 2017-02-03 DIAGNOSIS — I5022 Chronic systolic (congestive) heart failure: Secondary | ICD-10-CM | POA: Diagnosis present

## 2017-02-03 HISTORY — PX: ICD GENERATOR CHANGEOUT: EP1231

## 2017-02-03 LAB — SURGICAL PCR SCREEN
MRSA, PCR: POSITIVE — AB
Staphylococcus aureus: POSITIVE — AB

## 2017-02-03 SURGERY — ICD GENERATOR CHANGEOUT
Anesthesia: LOCAL

## 2017-02-03 MED ORDER — SODIUM CHLORIDE 0.9 % IR SOLN
Status: AC
  Filled 2017-02-03: qty 2

## 2017-02-03 MED ORDER — LIDOCAINE HCL (PF) 1 % IJ SOLN
INTRAMUSCULAR | Status: DC | PRN
  Administered 2017-02-03: 40 mL via INTRADERMAL

## 2017-02-03 MED ORDER — ACETAMINOPHEN 325 MG PO TABS
325.0000 mg | ORAL_TABLET | ORAL | Status: DC | PRN
  Filled 2017-02-03: qty 2

## 2017-02-03 MED ORDER — SODIUM CHLORIDE 0.9 % IR SOLN
Status: DC | PRN
  Administered 2017-02-03: 500 mL

## 2017-02-03 MED ORDER — CEFAZOLIN SODIUM-DEXTROSE 2-3 GM-% IV SOLR
INTRAVENOUS | Status: DC | PRN
  Administered 2017-02-03: 2 g via INTRAVENOUS

## 2017-02-03 MED ORDER — FENTANYL CITRATE (PF) 100 MCG/2ML IJ SOLN
INTRAMUSCULAR | Status: DC | PRN
  Administered 2017-02-03 (×2): 12.5 ug via INTRAVENOUS
  Administered 2017-02-03: 25 ug via INTRAVENOUS

## 2017-02-03 MED ORDER — MUPIROCIN 2 % EX OINT: TOPICAL_OINTMENT | Freq: Once | CUTANEOUS | Status: DC

## 2017-02-03 MED ORDER — CEFAZOLIN SODIUM-DEXTROSE 2-4 GM/100ML-% IV SOLN
2.0000 g | INTRAVENOUS | Status: DC
  Filled 2017-02-03: qty 100

## 2017-02-03 MED ORDER — FENTANYL CITRATE (PF) 100 MCG/2ML IJ SOLN
INTRAMUSCULAR | Status: AC
  Filled 2017-02-03: qty 2

## 2017-02-03 MED ORDER — CHLORHEXIDINE GLUCONATE 4 % EX LIQD
60.0000 mL | Freq: Once | CUTANEOUS | Status: DC
  Filled 2017-02-03: qty 60

## 2017-02-03 MED ORDER — MIDAZOLAM HCL 5 MG/5ML IJ SOLN
INTRAMUSCULAR | Status: DC | PRN
  Administered 2017-02-03: 1 mg via INTRAVENOUS
  Administered 2017-02-03: 2 mg via INTRAVENOUS
  Administered 2017-02-03 (×2): 1 mg via INTRAVENOUS

## 2017-02-03 MED ORDER — MIDAZOLAM HCL 5 MG/5ML IJ SOLN
INTRAMUSCULAR | Status: AC
  Filled 2017-02-03: qty 5

## 2017-02-03 MED ORDER — SODIUM CHLORIDE 0.9 % IR SOLN
80.0000 mg | Status: DC
  Filled 2017-02-03: qty 2

## 2017-02-03 MED ORDER — SODIUM CHLORIDE 0.9 % IV SOLN
INTRAVENOUS | Status: DC
  Administered 2017-02-03: 08:00:00 via INTRAVENOUS

## 2017-02-03 MED ORDER — MUPIROCIN 2 % EX OINT
TOPICAL_OINTMENT | CUTANEOUS | Status: AC
  Administered 2017-02-03: 1 via NASAL
  Filled 2017-02-03: qty 22

## 2017-02-03 MED ORDER — CEFAZOLIN SODIUM-DEXTROSE 2-4 GM/100ML-% IV SOLN
INTRAVENOUS | Status: AC
  Filled 2017-02-03: qty 100

## 2017-02-03 MED ORDER — LIDOCAINE HCL (PF) 1 % IJ SOLN
INTRAMUSCULAR | Status: AC
Start: 2017-02-03 — End: ?
  Filled 2017-02-03: qty 60

## 2017-02-03 MED ORDER — ONDANSETRON HCL 4 MG/2ML IJ SOLN: 4.0000 mg | Freq: Four times a day (QID) | INTRAMUSCULAR | Status: DC | PRN

## 2017-02-03 SURGICAL SUPPLY — 4 items
CABLE SURGICAL S-101-97-12 (CABLE) ×2 IMPLANT
ICD ELLIPSE VR CD1411-36C (ICD Generator) ×2 IMPLANT
PAD DEFIB LIFELINK (PAD) ×2 IMPLANT
TRAY PACEMAKER INSERTION (PACKS) ×2 IMPLANT

## 2017-02-03 NOTE — Discharge Instructions (Signed)
Mupirocin nasal ointment What is this medicine? MUPIROCIN CALCIUM (myoo PEER oh sin KAL see um) is an antibiotic. It is used inside the nose to treat infections that are caused by certain bacteria. This helps prevent the spread of infection to patients and health care workers during outbreaks at institutions. This medicine may be used for other purposes; ask your health care provider or pharmacist if you have questions. COMMON BRAND NAME(S): Bactroban What should I tell my health care provider before I take this medicine? They need to know if you have any of these conditions: -an unusual or allergic reaction to mupirocin, other medicines, foods, dyes, or preservatives -pregnant or trying to get pregnant -breast-feeding How should I use this medicine? This medicine is only for use inside the nose. Follow the directions on the prescription label. Wash your hands before and after use. Squeeze half the contents of a single-use tube into one nostril, then squeeze the other half into the other nostril. Press the sides of your nose together and gently massage after application to spread the ointment throughout the nostrils. Do not use your medicine more often than directed. Finish the full course of medicine prescribed by your doctor or health care professional even if you think your condition is better. Talk to your pediatrician regarding the use of this medicine in children. Special care may be needed. Overdosage: If you think you have taken too much of this medicine contact a poison control center or emergency room at once. NOTE: This medicine is only for you. Do not share this medicine with others. What if I miss a dose? If you miss a dose, take it as soon as you can. If it is almost time for your next dose, take only that dose. Do not take double or extra doses. What may interact with this medicine? Interactions are not expected. Do not use any other nose products without telling your doctor or  health care professional. This list may not describe all possible interactions. Give your health care provider a list of all the medicines, herbs, non-prescription drugs, or dietary supplements you use. Also tell them if you smoke, drink alcohol, or use illegal drugs. Some items may interact with your medicine. What should I watch for while using this medicine? If your nose is severely irritated, burning or stinging from use of this medicine, stop using it and contact your doctor or health care professional. Do not get this medicine in your eyes. If you do, rinse out with plenty of cool tap water. What side effects may I notice from receiving this medicine? Side effects that you should report to your doctor or health care professional as soon as possible: -severe irritation, burning, stinging, or pain Side effects that usually do not require medical attention (report to your doctor or health care professional if they continue or are bothersome): -altered taste -cough -headache -skin itching -sore throat -stuffy or runny nose This list may not describe all possible side effects. Call your doctor for medical advice about side effects. You may report side effects to FDA at 1-800-FDA-1088. Where should I keep my medicine? Keep out of the reach of children. Store at room temperature between 15 and 30 degrees C (59 and 86 degrees F). Do not refrigerate. One tube of ointment is for single use in both nostrils. Throw away after use. NOTE: This sheet is a summary. It may not cover all possible information. If you have questions about this medicine, talk to your doctor, pharmacist, or  health care provider.  2017 Elsevier/Gold Standard (2008-06-19 14:36:10)  Pacemaker Battery Change, Care After Refer to this sheet in the next few weeks. These instructions provide you with information on caring for yourself after your procedure. Your health care provider may also give you more specific instructions. Your  treatment has been planned according to current medical practices, but problems sometimes occur. Call your health care provider if you have any problems or questions after your procedure. WHAT TO EXPECT AFTER THE PROCEDURE After your procedure, it is typical to have the following sensations:  Soreness at the pacemaker site. HOME CARE INSTRUCTIONS   Keep the incision clean and dry.  Unless advised otherwise, you may shower beginning 48 hours after your procedure.  For the first week after the replacement, avoid stretching motions that pull at the incision site, and avoid heavy exercise with the arm that is on the same side as the incision.  Take medicines only as directed by your health care provider.  Keep all follow-up visits as directed by your health care provider. SEEK MEDICAL CARE IF:   You have pain at the incision site that is not relieved by over-the-counter or prescription medicine.  There is drainage or pus from the incision site.  There is swelling larger than a lime at the incision site.  You develop red streaking that extends above or below the incision site.  You feel brief, intermittent palpitations, light-headedness, or any symptoms that you feel might be related to your heart. SEEK IMMEDIATE MEDICAL CARE IF:   You experience chest pain that is different than the pain at the pacemaker site.  You experience shortness of breath.  You have palpitations or irregular heartbeat.  You have light-headedness that does not go away quickly.  You faint.  You have pain that gets worse and is not relieved by medicine. This information is not intended to replace advice given to you by your health care provider. Make sure you discuss any questions you have with your health care provider. Document Released: 09/21/2013 Document Revised: 12/22/2014 Document Reviewed: 09/21/2013 Elsevier Interactive Patient Education  2017 [employer].

## 2017-02-03 NOTE — H&P (Signed)
ICD Criteria  Current LVEF:30%. Within 12 months prior to implant: Yes   Heart failure history: Yes, Class II  Cardiomyopathy history: Yes, Ischemic Cardiomyopathy - Prior MI.  Atrial Fibrillation/Atrial Flutter: No.  Ventricular tachycardia history: yes, unstable hemodynamically Cardiac arrest history: No.  History of syndromes with risk of sudden death: No.  Previous ICD: Yes, Reason for ICD:  Primary prevention.  Current ICD indication: Secondary  PPM indication: No.   Class I or II Bradycardia indication present: No  Beta Blocker therapy for 3 or more months: Yes, prescribed.   Ace Inhibitor/ARB therapy for 3 or more months: Yes, prescribed.

## 2017-02-04 MED FILL — Midazolam HCl Inj 5 MG/5ML (Base Equivalent): INTRAMUSCULAR | Qty: 5 | Status: AC

## 2017-02-16 ENCOUNTER — Ambulatory Visit (INDEPENDENT_AMBULATORY_CARE_PROVIDER_SITE_OTHER): Payer: Medicare Other | Admitting: *Deleted

## 2017-02-16 DIAGNOSIS — I5022 Chronic systolic (congestive) heart failure: Secondary | ICD-10-CM | POA: Diagnosis not present

## 2017-02-16 NOTE — Progress Notes (Signed)
Wound check appointment. Steri-strips removed prior to wound check. Wound without redness or edema. Incision edges approximated, wound well healed. Normal device function. Threshold, sensing, and impedances consistent with implant measurements. Device programmed at chronic output values. Histogram distribution appropriate for patient and level of activity. No  high ventricular rates noted. Patient educated about wound care. ROV w/ GT 04/2017

## 2017-04-03 ENCOUNTER — Other Ambulatory Visit: Payer: Self-pay | Admitting: Internal Medicine

## 2017-04-20 ENCOUNTER — Encounter: Payer: Self-pay | Admitting: Internal Medicine

## 2017-04-27 ENCOUNTER — Encounter: Payer: Self-pay | Admitting: Internal Medicine

## 2017-04-27 ENCOUNTER — Ambulatory Visit (INDEPENDENT_AMBULATORY_CARE_PROVIDER_SITE_OTHER): Payer: Medicare Other | Admitting: Internal Medicine

## 2017-04-27 VITALS — BP 126/74 | HR 74 | Temp 98.1°F | Resp 14 | Ht 69.0 in | Wt 190.2 lb

## 2017-04-27 DIAGNOSIS — I25810 Atherosclerosis of coronary artery bypass graft(s) without angina pectoris: Secondary | ICD-10-CM | POA: Diagnosis not present

## 2017-04-27 DIAGNOSIS — M109 Gout, unspecified: Secondary | ICD-10-CM

## 2017-04-27 DIAGNOSIS — Z1159 Encounter for screening for other viral diseases: Secondary | ICD-10-CM

## 2017-04-27 DIAGNOSIS — Z23 Encounter for immunization: Secondary | ICD-10-CM | POA: Diagnosis not present

## 2017-04-27 DIAGNOSIS — R739 Hyperglycemia, unspecified: Secondary | ICD-10-CM | POA: Diagnosis not present

## 2017-04-27 DIAGNOSIS — E785 Hyperlipidemia, unspecified: Secondary | ICD-10-CM

## 2017-04-27 DIAGNOSIS — Z0000 Encounter for general adult medical examination without abnormal findings: Secondary | ICD-10-CM

## 2017-04-27 LAB — LIPID PANEL
CHOL/HDL RATIO: 3
Cholesterol: 152 mg/dL (ref 0–200)
HDL: 49.4 mg/dL (ref >39.00)
LDL Cholesterol: 86 mg/dL (ref 0–99)
NonHDL: 103.04
TRIGLYCERIDES: 85 mg/dL (ref 0.0–149.0)
VLDL: 17 mg/dL (ref 0.0–40.0)

## 2017-04-27 LAB — HEMOGLOBIN A1C: Hgb A1c MFr Bld: 6.2 % (ref 4.6–6.5)

## 2017-04-27 LAB — HEPATIC FUNCTION PANEL
ALT: 18 U/L (ref 0–53)
AST: 17 U/L (ref 0–37)
Albumin: 3.8 g/dL (ref 3.5–5.2)
Alkaline Phosphatase: 52 U/L (ref 39–117)
BILIRUBIN DIRECT: 0.1 mg/dL (ref 0.0–0.3)
TOTAL PROTEIN: 6.3 g/dL (ref 6.0–8.3)
Total Bilirubin: 0.9 mg/dL (ref 0.2–1.2)

## 2017-04-27 LAB — TSH: TSH: 1.02 u[IU]/mL (ref 0.35–4.50)

## 2017-04-27 NOTE — Progress Notes (Signed)
Pre visit review using our clinic review tool, if applicable. No additional management support is needed unless otherwise documented below in the visit note. 

## 2017-04-27 NOTE — Assessment & Plan Note (Addendum)
--  Td 04/2017; pneumonia shot 2009, Prevnar 2015; zostavax 2014; shingrex discussed (on back order) --CCS: 01-2008: Cscope, tics, no polyps, next in 10 years --Prostate cancer screening: DRE- PSA wnl 2016, guidelines discussed, will reassess screening next year --Diet and exercise -- doing very well with life style --labs : Hep C, FLP, LFTs, A1c, TSH

## 2017-04-27 NOTE — Patient Instructions (Signed)
GO TO THE LAB : Get the blood work     GO TO THE FRONT DESK Schedule your next appointment for a  yearly checkup in one year, fasting

## 2017-04-27 NOTE — Progress Notes (Signed)
Subjective:    Patient ID: Matthew Joyce, male    DOB: 12-16-1950, 66 y.o.   MRN: [id number]  DOS:  04/27/2017 Type of visit - description : Routine check Interval history: Cardiovascular: Seen recently by [name], felt to be stable High cholesterol: On simvastatin, no apparent side effects Prediabetes: Doing great, eating healthy and remains active   Review of Systems Denies chest pain, difficulty breathing. No lower extremity edema No nausea, vomiting, diarrhea or blood in the stools. No dysuria or gross hematuria.   Past Medical History:  Diagnosis Date  . AICD (automatic cardioverter/defibrillator) present 6/09   St. Jude  . Bradycardia    Use of beta blocker limited  . CAD (coronary artery disease)    a- S/p cabg in march 2006 by Dr.Owen;  b. myoview 5/12: large IL scar from apex to base, no ischemia, EF 37%  . DM (diabetes mellitus) ([place])   . ED (erectile dysfunction)   . Gout   . Hyperlipidemia   . Ischemic cardiomyopathy    a-Echocardiogram, Nov 2006, showed ejection fraction of 30-40% with mild to moderated mitral  regurgitation and mild aortic regurgitation b- Cardiac MRI, May 2007, EF of 44% with 50% scar involving  the inferolateral walls. No comment of mitral regurgitation. c- last echo  11/10: EF 30-35%  mod MR d- Nega T-Wave alternans testing in May of 2007 e- no ACE due to low BP;  f. CPX 3/11: normal  . 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 Systolic CHF, chronic ([place])     Past Surgical History:  Procedure Laterality Date  . CARDIAC DEFIBRILLATOR PLACEMENT  06/01/08   ICD  . CORONARY ARTERY BYPASS GRAFT  02-2005  . ICD GENERATOR CHANGEOUT N/A 02/03/2017   Procedure: ICD Generator Changeout;  Surgeon: [name], MD;  Location: [place] [facility];  Service: Cardiovascular;  Laterality: N/A;  . ORIF [place]   left   . SHOULDER ARTHROSCOPY  02/2010   rt   . TONSILLECTOMY     as a child    Social  History   Social History  . Marital status: Married    Spouse name: N/A  . Number of children: 2  . Years of education: N/A   Occupational History  . 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  [place] History Main Topics  . Smoking status: Former Smoker    Packs/day: 0.10    Types: Cigarettes    Quit date: 12/16/1999  . Smokeless tobacco: Never Used  . Alcohol use 0.0 oz/week     Comment: socially  . Drug use: No  . Sexual activity: Not on file   Other Topics Concern  . Not on file   Social History Narrative   Household- pt and wife   2 adult children in [place]      Allergies as of 04/27/2017      Reactions   Atorvastatin    REACTION: myalgia   Sulfonamide Derivatives    Rash and hot flashes   Tape Other (See Comments)   Rips skin- use PAPER tape   Sulfamethoxazole Rash   Pt turns red      Medication List       Accurate as of 04/27/17 11:59 PM. Always use your most recent med list.          aspirin 81 MG tablet Take 81 mg  by mouth daily.   candesartan 4 MG tablet Commonly known as:  ATACAND TAKE ONE TABLET BY MOUTH EVERY DAY   carboxymethylcellulose 0.5 % Soln Commonly known as:  REFRESH PLUS Place 1 drop into both eyes daily as needed.   cetirizine 10 MG tablet Commonly known as:  ZYRTEC Take 10 mg by mouth daily.   Fish Oil 1000 MG Caps Take 3 capsules by mouth daily.   metoprolol succinate 25 MG 24 hr tablet Commonly known as:  TOPROL-XL Take 12.5 mg by mouth 2 (two) times daily.   nitroGLYCERIN 0.4 MG SL tablet Commonly known as:  NITROSTAT Place 0.4 mg under the tongue every 5 (five) minutes as needed for chest pain (MAX 3 TABLETS).   simvastatin 40 MG tablet Commonly known as:  ZOCOR TAKE ONE TABLET BY MOUTH AT BEDTIME          Objective:   Physical Exam BP 126/74 (BP Location: Left Arm, Patient Position: Sitting, Cuff Size: Normal)   Pulse 74   Temp 98.1 F (36.7 C)  (Oral)   Resp 14   Ht 5\' 9"  (1.753 m)   Wt 190 lb 4 oz (86.3 kg)   SpO2 98%   BMI 28.10 kg/m   General:   Well developed, well nourished . NAD.  Neck: No  thyromegaly  HEENT:  Normocephalic . Face symmetric, atraumatic Lungs:  CTA B Normal respiratory effort, no intercostal retractions, no accessory muscle use. Heart: RRR,  no murmur.  No pretibial edema bilaterally  Abdomen:  Not distended, soft, non-tender. No rebound or rigidity.   Skin: Exposed areas without rash. Not pale. Not jaundice Neurologic:  alert & oriented X3.  Speech normal, gait appropriate for age and unassisted Strength symmetric and appropriate for age.  Psych: Cognition and judgment appear intact.  Cooperative with normal attention span and concentration.  Behavior appropriate. No anxious or depressed appearing.    Assessment & Plan:  Assessment Prediabetes Hyperlipidemia Gout E.D. CV: --CAD, CABG 2006 --Ischemic cardiomyopathy of 30% 2006, EF 45 - 50% 08-2013 --RBBB --AICD 2009 St Jude, replaced 01-2017  PLAN: Prediabetes: excellent lifestyle, check A1c and TSH Hyperlipidemia: On simvastatin, check a FLP, LFTs. Gout: No recent events CAD: Asx. Cardiology note reviewed. Controlling CV RF Primary care issues reviewed RTC one year

## 2017-04-28 LAB — HEPATITIS C ANTIBODY: HCV AB: NEGATIVE

## 2017-04-28 NOTE — Assessment & Plan Note (Signed)
Prediabetes: excellent lifestyle, check A1c and TSH Hyperlipidemia: On simvastatin, check a FLP, LFTs. Gout: No recent events CAD: Asx. Cardiology note reviewed. Controlling CV RF Primary care issues reviewed RTC one year

## 2017-05-04 MED ORDER — ROSUVASTATIN CALCIUM 40 MG PO TABS: 40.0000 mg | ORAL_TABLET | Freq: Every day | ORAL | 2 refills | Status: DC

## 2017-05-04 NOTE — Addendum Note (Signed)
Addended byDamita Dunnings D on: 05/04/2017 05:16 PM   Modules accepted: Orders

## 2017-05-05 ENCOUNTER — Encounter: Payer: Medicare Other | Admitting: Internal Medicine

## 2017-05-05 ENCOUNTER — Telehealth: Payer: Self-pay | Admitting: Internal Medicine

## 2017-05-05 ENCOUNTER — Encounter: Payer: Self-pay | Admitting: Internal Medicine

## 2017-05-05 ENCOUNTER — Ambulatory Visit (INDEPENDENT_AMBULATORY_CARE_PROVIDER_SITE_OTHER): Payer: Medicare Other | Admitting: Internal Medicine

## 2017-05-05 VITALS — BP 110/70 | HR 68 | Ht 69.5 in | Wt 191.2 lb

## 2017-05-05 DIAGNOSIS — I340 Nonrheumatic mitral (valve) insufficiency: Secondary | ICD-10-CM | POA: Diagnosis not present

## 2017-05-05 DIAGNOSIS — I25810 Atherosclerosis of coronary artery bypass graft(s) without angina pectoris: Secondary | ICD-10-CM

## 2017-05-05 DIAGNOSIS — Z95810 Presence of automatic (implantable) cardiac defibrillator: Secondary | ICD-10-CM | POA: Diagnosis not present

## 2017-05-05 DIAGNOSIS — I5022 Chronic systolic (congestive) heart failure: Secondary | ICD-10-CM

## 2017-05-05 LAB — CUP PACEART INCLINIC DEVICE CHECK
Date Time Interrogation Session: 20180522152633
HighPow Impedance: 50.8884
Implantable Lead Location: 753860
Implantable Pulse Generator Implant Date: 20180220
Lead Channel Pacing Threshold Pulse Width: 0.8 ms
Lead Channel Pacing Threshold Pulse Width: 0.8 ms
Lead Channel Setting Pacing Amplitude: 3 V
Lead Channel Setting Pacing Pulse Width: 0.8 ms
Lead Channel Setting Sensing Sensitivity: 0.5 mV
MDC IDC LEAD IMPLANT DT: 20090618
MDC IDC MSMT LEADCHNL RV IMPEDANCE VALUE: 337.5 Ohm
MDC IDC MSMT LEADCHNL RV PACING THRESHOLD AMPLITUDE: 2 V
MDC IDC MSMT LEADCHNL RV PACING THRESHOLD AMPLITUDE: 2 V
MDC IDC MSMT LEADCHNL RV SENSING INTR AMPL: 6 mV
MDC IDC PG SERIAL: 7406967
MDC IDC STAT BRADY RV PERCENT PACED: 0.13 %

## 2017-05-05 NOTE — Telephone Encounter (Signed)
PA initiated via Covermymeds; KEY: [id number]. Awaiting determination.

## 2017-05-05 NOTE — Patient Instructions (Addendum)
Medication Instructions:  Your physician has recommended you make the following change in your medication:  STOP taking Zocor START taking Crestor 40 mg daily  Labwork: None Ordered   Testing/Procedures: None Ordered   Follow-Up: Your physician wants you to follow-up in: 1 year with [name]. You will receive a reminder letter in the mail two months in advance. If you don't receive a letter, please call our office to schedule the follow-up appointment.  Remote monitoring is used to monitor your ICD from home. This monitoring reduces the number of office visits required to check your device to one time per year. It allows [place] to keep an eye on the functioning of your device to ensure it is working properly. You are scheduled for a device check from home on 08/04/17. You may send your transmission at any time that day. If you have a wireless device, the transmission will be sent automatically. After your physician reviews your transmission, you will receive a postcard with your next transmission date.    Any Other Special Instructions Will Be Listed Below (If Applicable).     If you need a refill on your cardiac medications before your next appointment, please call your pharmacy.

## 2017-05-05 NOTE — Telephone Encounter (Signed)
[place], [place] [address] [phone] (Phone) [phone] (Fax)    Reason for call:  [place], [place] [address] faxing over PA form to [phone] Attention Kaylyn regarding  rosuvastatin (CRESTOR) 40 MG tablet

## 2017-05-05 NOTE — Progress Notes (Signed)
HPI Mr. Hanken returns today for followup. He is a pleasant 66 yo man with a h/o ICM, chronic class 1-2 CHF, and HTN. He is s/p ICD implant. In the interim, he has done well. He denies chest pain, shortness of breath, peripheral edema, or syncope. He is back playing golf most days it does not rain. No ICD shocks since his shock in June of 2 years ago. He has undergone ICD generator change out and his new device is working normally. He has dyslipidemia and was recently encouraged to change from simvastatin to crestor. His LDL is 86.  Allergies  Allergen Reactions  . Atorvastatin     REACTION: myalgia  . Sulfonamide Derivatives     Rash and hot flashes  . Tape Other (See Comments)    Rips skin- use PAPER tape  . Sulfamethoxazole Rash    Pt turns red     Current Outpatient Prescriptions  Medication Sig Dispense Refill  . aspirin 81 MG tablet Take 81 mg by mouth daily.      . candesartan (ATACAND) 4 MG tablet TAKE ONE TABLET BY MOUTH EVERY DAY 90 tablet 3  . carboxymethylcellulose (REFRESH PLUS) 0.5 % SOLN Place 1 drop into both eyes daily as needed (dry eyes).     . cetirizine (ZYRTEC) 10 MG tablet Take 10 mg by mouth daily.      . metoprolol succinate (TOPROL-XL) 25 MG 24 hr tablet Take 12.5 mg by mouth 2 (two) times daily.    . nitroGLYCERIN (NITROSTAT) 0.4 MG SL tablet Place 0.4 mg under the tongue every 5 (five) minutes as needed for chest pain (MAX 3 TABLETS).    . Omega-3 Fatty Acids (FISH OIL) 1000 MG CAPS Take 3 capsules by mouth daily.    . rosuvastatin (CRESTOR) 40 MG tablet Take 1 tablet (40 mg total) by mouth at bedtime. (Patient not taking: Reported on 05/05/2017) 30 tablet 2   No current facility-administered medications for this visit.      Past Medical History:  Diagnosis Date  . AICD (automatic cardioverter/defibrillator) present 6/09   St. Jude  . Bradycardia    Use of beta blocker limited  . CAD (coronary artery disease)    a- S/p cabg in march 2006 by Dr.Owen;   b. myoview 5/12: large IL scar from apex to base, no ischemia, EF 37%  . DM (diabetes mellitus) ([place])   . ED (erectile dysfunction)   . Gout   . Hyperlipidemia   . Ischemic cardiomyopathy    a-Echocardiogram, Nov 2006, showed ejection fraction of 30-40% with mild to moderated mitral  regurgitation and mild aortic regurgitation b- Cardiac MRI, May 2007, EF of 44% with 50% scar involving  the inferolateral walls. No comment of mitral regurgitation. c- last echo  11/10: EF 30-35%  mod MR d- Nega T-Wave alternans testing in May of 2007 e- no ACE due to low BP;  f. CPX 3/11: normal  . 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 Systolic CHF, chronic (HCC)     ROS:   All systems reviewed and negative except as noted in the HPI.   Past Surgical History:  Procedure Laterality Date  . CARDIAC DEFIBRILLATOR PLACEMENT  06/01/08   ICD  . CORONARY ARTERY BYPASS GRAFT  02-2005  . ICD GENERATOR CHANGEOUT N/A 02/03/2017   Procedure: ICD Generator Changeout;  Surgeon: [name], MD;  Location: [facility];  Service: Cardiovascular;  Laterality: N/A;  . ORIF  ANKLE FRACTURE  1987   left   . SHOULDER ARTHROSCOPY  02/2010   rt   . TONSILLECTOMY     as a child     Family History  Problem Relation Age of Onset  . Heart attack Father 57  . Cardiomyopathy Brother   . Colon cancer Neg Hx   . Prostate cancer Neg Hx   . Diabetes Neg Hx      Social History   Social History  . Marital status: Married    Spouse name: N/A  . Number of children: 2  . Years of education: N/A   Occupational History  . 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  [place] History Main Topics  . Smoking status: Former Smoker    Packs/day: 0.10    Types: Cigarettes    Quit date: 12/16/1999  . Smokeless tobacco: Never Used  . Alcohol use 0.0 oz/week     Comment: socially  . Drug use: No  . Sexual activity: Not on file    Other Topics Concern  . Not on file   Social History Narrative   Household- pt and wife   2 adult children in [place]     BP 110/70   Pulse 68   Ht 5' 9.5" (1.765 m)   Wt 191 lb 3.2 oz (86.7 kg)   SpO2 96%   BMI 27.83 kg/m   Physical Exam:  Well appearing middle-aged man, NAD HEENT: Unremarkable Neck:  6 cm JVD, no thyromegally Lungs:  Clear with no wheezes, rales, or rhonchi. Well-healed ICD incision. HEART:  Regular rate rhythm, no murmurs, no rubs, no clicks Abd:  soft, positive bowel sounds, no organomegally, no rebound, no guarding Ext:  2 plus pulses, no edema, no cyanosis, no clubbing Skin:  No rashes no nodules Neuro:  CN II through XII intact, motor grossly intact  DEVICE  Normal device function.  See PaceArt for details.   Assess/Plan:  1. VF - he has had no recurrent episodes. He will continue his current meds. 2. Angina - his angina is well controlled. He remains active exercising.  3. Chronic systolic heart failure - he is class 1-2. He will continue his current meds. 4. ICD - his St. Jude device is working normally. We will recheck in several months.  Matthew Joyce.D.

## 2017-05-05 NOTE — Telephone Encounter (Signed)
PA approved through 12/14/2017, reference number: [id number]. [place] informed.

## 2017-07-02 ENCOUNTER — Telehealth: Payer: Self-pay | Admitting: Internal Medicine

## 2017-07-02 NOTE — Telephone Encounter (Signed)
advise patient: Due for labs, FLP, AST, ALT. Please arrange

## 2017-07-02 NOTE — Telephone Encounter (Signed)
My Chart message sent

## 2017-07-03 ENCOUNTER — Other Ambulatory Visit (INDEPENDENT_AMBULATORY_CARE_PROVIDER_SITE_OTHER): Payer: Medicare Other

## 2017-07-03 DIAGNOSIS — E785 Hyperlipidemia, unspecified: Secondary | ICD-10-CM

## 2017-07-03 LAB — LIPID PANEL
Cholesterol: 128 mg/dL (ref 0–200)
HDL: 49.8 mg/dL (ref >39.00)
LDL CALC: 63 mg/dL (ref 0–99)
NONHDL: 78.45
Total CHOL/HDL Ratio: 3
Triglycerides: 76 mg/dL (ref 0.0–149.0)
VLDL: 15.2 mg/dL (ref 0.0–40.0)

## 2017-07-03 LAB — ALT: ALT: 21 U/L (ref 0–53)

## 2017-07-03 LAB — AST: AST: 16 U/L (ref 0–37)

## 2017-07-06 MED ORDER — ROSUVASTATIN CALCIUM 40 MG PO TABS: 40.0000 mg | ORAL_TABLET | Freq: Every day | ORAL | 6 refills | Status: DC

## 2017-08-04 ENCOUNTER — Ambulatory Visit (INDEPENDENT_AMBULATORY_CARE_PROVIDER_SITE_OTHER): Payer: Medicare Other | Admitting: *Deleted

## 2017-08-04 DIAGNOSIS — I5022 Chronic systolic (congestive) heart failure: Secondary | ICD-10-CM | POA: Diagnosis not present

## 2017-08-04 DIAGNOSIS — I472 Ventricular tachycardia, unspecified: Secondary | ICD-10-CM

## 2017-08-05 NOTE — Progress Notes (Signed)
Remote ICD transmission.   

## 2017-08-12 LAB — CUP PACEART REMOTE DEVICE CHECK
Battery Remaining Longevity: 87 mo
Battery Remaining Percentage: 95 %
Battery Voltage: 3.19 V
Brady Statistic RV Percent Paced: 1 %
HIGH POWER IMPEDANCE MEASURED VALUE: 51 Ohm
HIGH POWER IMPEDANCE MEASURED VALUE: 51 Ohm
Implantable Lead Implant Date: 20090618
Implantable Pulse Generator Implant Date: 20180220
Lead Channel Impedance Value: 360 Ohm
Lead Channel Pacing Threshold Amplitude: 2 V
Lead Channel Pacing Threshold Pulse Width: 0.8 ms
Lead Channel Setting Pacing Amplitude: 3 V
MDC IDC LEAD LOCATION: 753860
MDC IDC MSMT LEADCHNL RV SENSING INTR AMPL: 6.9 mV
MDC IDC SESS DTM: 20180821080030
MDC IDC SET LEADCHNL RV PACING PULSEWIDTH: 0.8 ms
MDC IDC SET LEADCHNL RV SENSING SENSITIVITY: 0.5 mV
Pulse Gen Serial Number: 7406967

## 2017-08-14 ENCOUNTER — Encounter: Payer: Self-pay | Admitting: Cardiology

## 2017-08-31 ENCOUNTER — Other Ambulatory Visit (HOSPITAL_COMMUNITY): Payer: Self-pay | Admitting: Internal Medicine

## 2017-09-14 DIAGNOSIS — L570 Actinic keratosis: Secondary | ICD-10-CM | POA: Diagnosis not present

## 2017-09-14 DIAGNOSIS — L820 Inflamed seborrheic keratosis: Secondary | ICD-10-CM | POA: Diagnosis not present

## 2017-10-12 ENCOUNTER — Telehealth: Payer: Self-pay | Admitting: Internal Medicine

## 2017-10-12 MED ORDER — ZOSTER VAC RECOMB ADJUVANTED 50 MCG/0.5ML IM SUSR: 0.5000 mL | Freq: Once | INTRAMUSCULAR | 1 refills | Status: AC

## 2017-10-12 NOTE — Telephone Encounter (Signed)
Relation to AD:LKZG Call back number:[phone] Pharmacy:  Reason for call:  Patient scheduled flu shot for 10/22/17 with nurse only and would like shingle vaccination,please advise

## 2017-10-12 NOTE — Addendum Note (Signed)
Addended byDamita Dunnings D on: 10/12/2017 05:03 PM   Modules accepted: Orders

## 2017-10-12 NOTE — Telephone Encounter (Signed)
Patient requesting shingles orders please fax to   , [place] - [place] [phone] (Phone) [phone] (Fax)

## 2017-10-12 NOTE — Telephone Encounter (Signed)
Rx sent 

## 2017-10-12 NOTE — Telephone Encounter (Signed)
Pt is Medicare- which does not cover Shingles vaccination in office only at the pharmacy.

## 2017-10-22 ENCOUNTER — Ambulatory Visit (INDEPENDENT_AMBULATORY_CARE_PROVIDER_SITE_OTHER): Payer: Medicare Other

## 2017-10-22 DIAGNOSIS — Z23 Encounter for immunization: Secondary | ICD-10-CM

## 2017-11-02 ENCOUNTER — Ambulatory Visit (HOSPITAL_BASED_OUTPATIENT_CLINIC_OR_DEPARTMENT_OTHER)
Admission: RE | Admit: 2017-11-02 | Discharge: 2017-11-02 | Disposition: A | Discharge status: Home / Self Care | Payer: Medicare Other | Source: Ambulatory Visit | Attending: Internal Medicine | Admitting: Internal Medicine

## 2017-11-02 ENCOUNTER — Encounter: Payer: Self-pay | Admitting: Internal Medicine

## 2017-11-02 ENCOUNTER — Ambulatory Visit (INDEPENDENT_AMBULATORY_CARE_PROVIDER_SITE_OTHER): Payer: Medicare Other | Admitting: Internal Medicine

## 2017-11-02 VITALS — BP 118/68 | HR 59 | Temp 97.8°F | Resp 14 | Ht 69.5 in | Wt 198.5 lb

## 2017-11-02 DIAGNOSIS — I700 Atherosclerosis of aorta: Secondary | ICD-10-CM | POA: Diagnosis not present

## 2017-11-02 DIAGNOSIS — R221 Localized swelling, mass and lump, neck: Secondary | ICD-10-CM | POA: Insufficient documentation

## 2017-11-02 DIAGNOSIS — I517 Cardiomegaly: Secondary | ICD-10-CM | POA: Insufficient documentation

## 2017-11-02 DIAGNOSIS — I25810 Atherosclerosis of coronary artery bypass graft(s) without angina pectoris: Secondary | ICD-10-CM

## 2017-11-02 LAB — CBC WITH DIFFERENTIAL/PLATELET
BASOS PCT: 1.1 %
Basophils Absolute: 97 cells/uL (ref 0–200)
EOS ABS: 273 {cells}/uL (ref 15–500)
Eosinophils Relative: 3.1 %
HEMATOCRIT: 41 % (ref 38.5–50.0)
HEMOGLOBIN: 13.6 g/dL (ref 13.2–17.1)
LYMPHS ABS: 2763 {cells}/uL (ref 850–3900)
MCH: 29.2 pg (ref 27.0–33.0)
MCHC: 33.2 g/dL (ref 32.0–36.0)
MCV: 88 fL (ref 80.0–100.0)
MPV: 10.9 fL (ref 7.5–12.5)
Monocytes Relative: 12.3 %
Neutro Abs: 4585 cells/uL (ref 1500–7800)
Neutrophils Relative %: 52.1 %
Platelets: 193 10*3/uL (ref 140–400)
RBC: 4.66 10*6/uL (ref 4.20–5.80)
RDW: 11.7 % (ref 11.0–15.0)
Total Lymphocyte: 31.4 %
WBC mixed population: 1082 cells/uL — ABNORMAL HIGH (ref 200–950)
WBC: 8.8 10*3/uL (ref 3.8–10.8)

## 2017-11-02 LAB — BASIC METABOLIC PANEL
BUN: 13 mg/dL (ref 7–25)
CO2: 29 mmol/L (ref 20–32)
Calcium: 9.4 mg/dL (ref 8.6–10.3)
Chloride: 106 mmol/L (ref 98–110)
Creat: 0.75 mg/dL (ref 0.70–1.25)
GLUCOSE: 82 mg/dL (ref 65–99)
Potassium: 4 mmol/L (ref 3.5–5.3)
SODIUM: 140 mmol/L (ref 135–146)

## 2017-11-02 NOTE — Progress Notes (Signed)
Pre visit review using our clinic review tool, if applicable. No additional management support is needed unless otherwise documented below in the visit note. 

## 2017-11-02 NOTE — Progress Notes (Signed)
Subjective:    Patient ID: Matthew Joyce, male    DOB: [birth year], 66 y.o.   MRN: [id number]  DOS:  11/02/2017 Type of visit - description : acute Interval history: 2 days ago his daughter noted a lump at the right neck, patient was not aware of it.  After he did a self-examination he realized he indeed has a lump.   Review of Systems Denies any fever, chills or unexplained weight loss No night sweats No dental pain No recent cough  Past Medical History:  Diagnosis Date  . AICD (automatic cardioverter/defibrillator) present 6/09   St. Jude  . Bradycardia    Use of beta blocker limited  . CAD (coronary artery disease)    a- S/p cabg in march 2006 by Dr.Owen;  b. myoview 5/12: large IL scar from apex to base, no ischemia, EF 37%  . DM (diabetes mellitus) ([place])   . ED (erectile dysfunction)   . Gout   . Hyperlipidemia   . Ischemic cardiomyopathy    a-Echocardiogram, Nov 2006, showed ejection fraction of 30-40% with mild to moderated mitral  regurgitation and mild aortic regurgitation b- Cardiac MRI, May 2007, EF of 44% with 50% scar involving  the inferolateral walls. No comment of mitral regurgitation. c- last echo  11/10: EF 30-35%  mod MR d- Nega T-Wave alternans testing in May of 2007 e- no ACE due to low BP;  f. CPX 3/11: normal  . 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 Systolic CHF, chronic ([place])     Past Surgical History:  Procedure Laterality Date  . CARDIAC DEFIBRILLATOR PLACEMENT  06/01/08   ICD  . CORONARY ARTERY BYPASS GRAFT  02-2005  . ICD GENERATOR CHANGEOUT N/A 02/03/2017   Procedure: ICD Generator Changeout;  Surgeon: [name], MD;  Location: [facility];  Service: Cardiovascular;  Laterality: N/A;  . ORIF [place]   left   . SHOULDER ARTHROSCOPY  02/2010   rt   . TONSILLECTOMY     as a child    Social History   Socioeconomic History  . Marital status: Married    Spouse name: Not on file  .  Number of children: 2  . Years of education: Not on file  . Highest education level: Not on file  Social Needs  . Financial resource strain: Not on file  . Food insecurity - worry: Not on file  . Food insecurity - inability: Not on file  . Transportation needs - medical: Not on file  . Transportation needs - non-medical: Not on file  Occupational History  . 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 Smoking status: Former Smoker    Packs/day: 0.10    Types: Cigarettes    Last attempt to quit: 12/16/1999    Years since quitting: 17.8  . Smokeless tobacco: Never Used  Substance and Sexual Activity  . Alcohol use: Yes    Alcohol/week: 0.0 oz    Comment: socially  . Drug use: No  . Sexual activity: Not on file  Other Topics Concern  . Not on file  Social History Narrative   Household- pt and wife   2 adult children in [place]      Allergies as of 11/02/2017      Reactions   Atorvastatin    REACTION: myalgia   Sulfonamide Derivatives  Rash and hot flashes   Tape Other (See Comments)   Rips skin- use PAPER tape   Sulfamethoxazole Rash   Pt turns red      Medication List        Accurate as of 11/02/17 11:59 PM. Always use your most recent med list.          aspirin 81 MG tablet Take 81 mg by mouth daily.   candesartan 4 MG tablet Commonly known as:  ATACAND Take 1 tablet (4 mg total) by mouth daily. NEEDS FOLLOW UP   carboxymethylcellulose 0.5 % Soln Commonly known as:  REFRESH PLUS Place 1 drop into both eyes daily as needed (dry eyes).   cetirizine 10 MG tablet Commonly known as:  ZYRTEC Take 10 mg by mouth daily.   Fish Oil 1000 MG Caps Take 3 capsules by mouth daily.   metoprolol succinate 25 MG 24 hr tablet Commonly known as:  TOPROL-XL Take 0.5 tablets (12.5 mg total) by mouth 2 (two) times daily. NEEDS FOLLOW UP   nitroGLYCERIN 0.4 MG SL tablet Commonly  known as:  NITROSTAT Place 0.4 mg under the tongue every 5 (five) minutes as needed for chest pain (MAX 3 TABLETS).   rosuvastatin 40 MG tablet Commonly known as:  CRESTOR Take 1 tablet (40 mg total) by mouth at bedtime.          Objective:   Physical Exam BP 118/68 (BP Location: Left Arm, Patient Position: Sitting, Cuff Size: Small)   Pulse (!) 59   Temp 97.8 F (36.6 C) (Oral)   Resp 14   Ht 5' 9.5" (1.765 m)   Wt 198 lb 8 oz (90 kg)   SpO2 98%   BMI 28.89 kg/m   General:   Well developed, well nourished . NAD.  HEENT:  Normocephalic . Face symmetric, atraumatic. Oral cavity examination: gums without swelling or abscess. Neck: Asymmetric, on the lateral aspect of the neck has a 6 x 3 cm mass, not hard or firm, nontender.  No fluctuance.  Does not seem attach to deeper structures, does not move when the patient swallows. Question if the mass is pulsatile.  I was able to feel a normal carotid pulse on the left.  On the right, I feel a pulse  under the mass (so the mass may feel pulsatile by transmission only).  Unable to palpate the thyroid. No supraclavicular mass. Lungs:  CTA B Normal respiratory effort, no intercostal retractions, no accessory muscle use. Heart: RRR,  no murmur.  No pretibial edema bilaterally  Coming: Soft, no hepato-or splenomegaly Skin: Not pale. Not jaundice Lymphatic system: No mass or LAD at the axillary or groin area Neurologic:  alert & oriented X3.  Speech normal, gait appropriate for age and unassisted Psych--  Cognition and judgment appear intact.  Cooperative with normal attention span and concentration.  Behavior appropriate. No anxious or depressed appearing.          Assessment & Plan:   Assessment Prediabetes Hyperlipidemia Gout E.D. CV: --CAD, CABG 2006 --Ischemic cardiomyopathy of 30% 2006, EF 45 - 50% 08-2013 --RBBB --AICD 2009 St Jude, replaced 01-2017  PLAN: Mass, neck: found  2 days ago, unclear for how long  he had that. Question of the mass being pulsatile, will get a stat ultrasound, r/o aneurysm We will get a BMP, CBC, chest x-ray and CT neck, soft tissue with and without contrast (in AM once BMP is back) ER if pain, swelling, increase size. Addendum: Ultrasound showed a  soft mass, no aneurysm, will proceed with a CT and refer to ENT.  Patient aware

## 2017-11-02 NOTE — Patient Instructions (Addendum)
GO TO THE LAB : Get the blood work      STOP BY THE FIRST FLOOR: get a Ultrasound  We will get CT of the area soon as you are kidney function (BMP) returns  Go to the ER if: Severe pain, acute increase in the size of the area, fever or chills.  Headaches.

## 2017-11-03 ENCOUNTER — Ambulatory Visit (INDEPENDENT_AMBULATORY_CARE_PROVIDER_SITE_OTHER): Payer: Medicare Other | Admitting: *Deleted

## 2017-11-03 ENCOUNTER — Ambulatory Visit (HOSPITAL_BASED_OUTPATIENT_CLINIC_OR_DEPARTMENT_OTHER)
Admission: RE | Admit: 2017-11-03 | Discharge: 2017-11-03 | Disposition: A | Discharge status: Home / Self Care | Payer: Medicare Other | Source: Ambulatory Visit | Attending: Internal Medicine | Admitting: Internal Medicine

## 2017-11-03 DIAGNOSIS — I5022 Chronic systolic (congestive) heart failure: Secondary | ICD-10-CM

## 2017-11-03 DIAGNOSIS — R590 Localized enlarged lymph nodes: Secondary | ICD-10-CM | POA: Diagnosis not present

## 2017-11-03 DIAGNOSIS — I472 Ventricular tachycardia, unspecified: Secondary | ICD-10-CM

## 2017-11-03 DIAGNOSIS — R221 Localized swelling, mass and lump, neck: Secondary | ICD-10-CM | POA: Diagnosis not present

## 2017-11-03 DIAGNOSIS — J392 Other diseases of pharynx: Secondary | ICD-10-CM | POA: Insufficient documentation

## 2017-11-03 MED ORDER — IOPAMIDOL (ISOVUE-300) INJECTION 61%
100.0000 mL | Freq: Once | INTRAVENOUS | Status: AC | PRN
Start: 2017-11-03 — End: 2017-11-03
  Administered 2017-11-03: 75 mL via INTRAVENOUS

## 2017-11-03 NOTE — Assessment & Plan Note (Signed)
Mass, neck: found  2 days ago, unclear for how long he had that. Question of the mass being pulsatile, will get a stat ultrasound, r/o aneurysm We will get a BMP, CBC, chest x-ray and CT neck, soft tissue with and without contrast (in AM once BMP is back) ER if pain, swelling, increase size. Addendum: Ultrasound showed a soft mass, no aneurysm, will proceed with a CT and refer to ENT.  Patient aware

## 2017-11-04 ENCOUNTER — Telehealth: Payer: Self-pay | Admitting: *Deleted

## 2017-11-04 LAB — CUP PACEART REMOTE DEVICE CHECK
Battery Remaining Longevity: 86 mo
Battery Voltage: 3.16 V
Brady Statistic RV Percent Paced: 1 %
HighPow Impedance: 49 Ohm
HighPow Impedance: 49 Ohm
Implantable Lead Implant Date: 20090618
Implantable Lead Model: 7121
Lead Channel Pacing Threshold Amplitude: 2 V
Lead Channel Pacing Threshold Pulse Width: 0.8 ms
Lead Channel Sensing Intrinsic Amplitude: 7.5 mV
Lead Channel Setting Pacing Pulse Width: 0.8 ms
Lead Channel Setting Sensing Sensitivity: 0.5 mV
MDC IDC LEAD LOCATION: 753860
MDC IDC MSMT BATTERY REMAINING PERCENTAGE: 92 %
MDC IDC MSMT LEADCHNL RV IMPEDANCE VALUE: 400 Ohm
MDC IDC PG IMPLANT DT: 20180220
MDC IDC SESS DTM: 20181120090016
MDC IDC SET LEADCHNL RV PACING AMPLITUDE: 3 V
Pulse Gen Serial Number: 7406967

## 2017-11-04 NOTE — Telephone Encounter (Signed)
Received courtesy copy of results from [place]; forwarded to provider/SLS 11/21

## 2017-11-04 NOTE — Progress Notes (Signed)
Remote ICD transmission.   

## 2017-11-12 ENCOUNTER — Encounter: Payer: Self-pay | Admitting: Cardiology

## 2017-11-12 DIAGNOSIS — C770 Secondary and unspecified malignant neoplasm of lymph nodes of head, face and neck: Secondary | ICD-10-CM | POA: Diagnosis not present

## 2017-11-12 DIAGNOSIS — R591 Generalized enlarged lymph nodes: Secondary | ICD-10-CM | POA: Diagnosis not present

## 2017-11-12 DIAGNOSIS — C801 Malignant (primary) neoplasm, unspecified: Secondary | ICD-10-CM | POA: Diagnosis not present

## 2017-11-13 DIAGNOSIS — C760 Malignant neoplasm of head, face and neck: Secondary | ICD-10-CM | POA: Diagnosis not present

## 2017-11-13 DIAGNOSIS — C4442 Squamous cell carcinoma of skin of scalp and neck: Secondary | ICD-10-CM | POA: Diagnosis not present

## 2017-11-20 DIAGNOSIS — C801 Malignant (primary) neoplasm, unspecified: Secondary | ICD-10-CM | POA: Diagnosis not present

## 2017-11-20 DIAGNOSIS — R590 Localized enlarged lymph nodes: Secondary | ICD-10-CM | POA: Diagnosis not present

## 2017-11-20 DIAGNOSIS — C770 Secondary and unspecified malignant neoplasm of lymph nodes of head, face and neck: Secondary | ICD-10-CM | POA: Diagnosis not present

## 2017-11-25 DIAGNOSIS — C770 Secondary and unspecified malignant neoplasm of lymph nodes of head, face and neck: Secondary | ICD-10-CM | POA: Diagnosis not present

## 2017-11-25 DIAGNOSIS — C801 Malignant (primary) neoplasm, unspecified: Secondary | ICD-10-CM | POA: Diagnosis not present

## 2017-11-25 DIAGNOSIS — C7989 Secondary malignant neoplasm of other specified sites: Secondary | ICD-10-CM | POA: Diagnosis not present

## 2017-11-25 DIAGNOSIS — J00 Acute nasopharyngitis [common cold]: Secondary | ICD-10-CM | POA: Diagnosis not present

## 2017-11-27 DIAGNOSIS — C801 Malignant (primary) neoplasm, unspecified: Secondary | ICD-10-CM | POA: Diagnosis not present

## 2017-11-27 DIAGNOSIS — C770 Secondary and unspecified malignant neoplasm of lymph nodes of head, face and neck: Secondary | ICD-10-CM | POA: Diagnosis not present

## 2017-11-30 ENCOUNTER — Other Ambulatory Visit (HOSPITAL_COMMUNITY): Payer: Self-pay | Admitting: Internal Medicine

## 2017-12-02 ENCOUNTER — Other Ambulatory Visit (HOSPITAL_COMMUNITY): Payer: Self-pay | Admitting: *Deleted

## 2017-12-02 MED ORDER — METOPROLOL SUCCINATE ER 25 MG PO TB24: 12.5000 mg | ORAL_TABLET | Freq: Two times a day (BID) | ORAL | 3 refills | Status: DC

## 2017-12-02 MED ORDER — CANDESARTAN CILEXETIL 4 MG PO TABS: 4.0000 mg | ORAL_TABLET | Freq: Every day | ORAL | 3 refills | Status: DC

## 2017-12-03 DIAGNOSIS — C801 Malignant (primary) neoplasm, unspecified: Secondary | ICD-10-CM | POA: Diagnosis not present

## 2017-12-03 DIAGNOSIS — C770 Secondary and unspecified malignant neoplasm of lymph nodes of head, face and neck: Secondary | ICD-10-CM | POA: Diagnosis not present

## 2017-12-03 DIAGNOSIS — C760 Malignant neoplasm of head, face and neck: Secondary | ICD-10-CM | POA: Diagnosis not present

## 2017-12-04 DIAGNOSIS — C801 Malignant (primary) neoplasm, unspecified: Secondary | ICD-10-CM | POA: Diagnosis not present

## 2017-12-04 DIAGNOSIS — Z7189 Other specified counseling: Secondary | ICD-10-CM | POA: Diagnosis not present

## 2017-12-04 DIAGNOSIS — C770 Secondary and unspecified malignant neoplasm of lymph nodes of head, face and neck: Secondary | ICD-10-CM | POA: Diagnosis not present

## 2017-12-11 DIAGNOSIS — Z452 Encounter for adjustment and management of vascular access device: Secondary | ICD-10-CM | POA: Diagnosis not present

## 2017-12-11 DIAGNOSIS — C801 Malignant (primary) neoplasm, unspecified: Secondary | ICD-10-CM | POA: Diagnosis not present

## 2017-12-11 DIAGNOSIS — C760 Malignant neoplasm of head, face and neck: Secondary | ICD-10-CM | POA: Diagnosis not present

## 2017-12-11 DIAGNOSIS — C770 Secondary and unspecified malignant neoplasm of lymph nodes of head, face and neck: Secondary | ICD-10-CM | POA: Diagnosis not present

## 2017-12-11 DIAGNOSIS — Z5111 Encounter for antineoplastic chemotherapy: Secondary | ICD-10-CM | POA: Diagnosis not present

## 2017-12-17 DIAGNOSIS — C801 Malignant (primary) neoplasm, unspecified: Secondary | ICD-10-CM | POA: Diagnosis not present

## 2017-12-17 DIAGNOSIS — Z510 Encounter for antineoplastic radiation therapy: Secondary | ICD-10-CM | POA: Diagnosis not present

## 2017-12-17 DIAGNOSIS — C770 Secondary and unspecified malignant neoplasm of lymph nodes of head, face and neck: Secondary | ICD-10-CM | POA: Diagnosis not present

## 2017-12-18 DIAGNOSIS — Z95828 Presence of other vascular implants and grafts: Secondary | ICD-10-CM | POA: Diagnosis not present

## 2017-12-18 DIAGNOSIS — C770 Secondary and unspecified malignant neoplasm of lymph nodes of head, face and neck: Secondary | ICD-10-CM | POA: Diagnosis not present

## 2017-12-18 DIAGNOSIS — C760 Malignant neoplasm of head, face and neck: Secondary | ICD-10-CM | POA: Diagnosis not present

## 2017-12-18 DIAGNOSIS — C801 Malignant (primary) neoplasm, unspecified: Secondary | ICD-10-CM | POA: Diagnosis not present

## 2017-12-18 DIAGNOSIS — Z931 Gastrostomy status: Secondary | ICD-10-CM | POA: Diagnosis not present

## 2017-12-18 DIAGNOSIS — D72829 Elevated white blood cell count, unspecified: Secondary | ICD-10-CM | POA: Diagnosis not present

## 2017-12-21 DIAGNOSIS — C770 Secondary and unspecified malignant neoplasm of lymph nodes of head, face and neck: Secondary | ICD-10-CM | POA: Diagnosis not present

## 2017-12-21 DIAGNOSIS — Z510 Encounter for antineoplastic radiation therapy: Secondary | ICD-10-CM | POA: Diagnosis not present

## 2017-12-21 DIAGNOSIS — Z5111 Encounter for antineoplastic chemotherapy: Secondary | ICD-10-CM | POA: Diagnosis not present

## 2017-12-21 DIAGNOSIS — C801 Malignant (primary) neoplasm, unspecified: Secondary | ICD-10-CM | POA: Diagnosis not present

## 2017-12-22 DIAGNOSIS — Z510 Encounter for antineoplastic radiation therapy: Secondary | ICD-10-CM | POA: Diagnosis not present

## 2017-12-22 DIAGNOSIS — C801 Malignant (primary) neoplasm, unspecified: Secondary | ICD-10-CM | POA: Diagnosis not present

## 2017-12-22 DIAGNOSIS — C770 Secondary and unspecified malignant neoplasm of lymph nodes of head, face and neck: Secondary | ICD-10-CM | POA: Diagnosis not present

## 2017-12-23 DIAGNOSIS — C801 Malignant (primary) neoplasm, unspecified: Secondary | ICD-10-CM | POA: Diagnosis not present

## 2017-12-23 DIAGNOSIS — C770 Secondary and unspecified malignant neoplasm of lymph nodes of head, face and neck: Secondary | ICD-10-CM | POA: Diagnosis not present

## 2017-12-23 DIAGNOSIS — Z510 Encounter for antineoplastic radiation therapy: Secondary | ICD-10-CM | POA: Diagnosis not present

## 2017-12-24 ENCOUNTER — Ambulatory Visit (HOSPITAL_COMMUNITY)
Admission: RE | Admit: 2017-12-24 | Discharge: 2017-12-24 | Disposition: A | Discharge status: Home / Self Care | Payer: Medicare HMO | Source: Ambulatory Visit | Attending: Internal Medicine | Admitting: Internal Medicine

## 2017-12-24 ENCOUNTER — Encounter (HOSPITAL_COMMUNITY): Payer: Self-pay

## 2017-12-24 VITALS — BP 126/82 | HR 58 | Wt 198.0 lb

## 2017-12-24 DIAGNOSIS — E785 Hyperlipidemia, unspecified: Secondary | ICD-10-CM | POA: Insufficient documentation

## 2017-12-24 DIAGNOSIS — R1310 Dysphagia, unspecified: Secondary | ICD-10-CM | POA: Insufficient documentation

## 2017-12-24 DIAGNOSIS — Z7982 Long term (current) use of aspirin: Secondary | ICD-10-CM | POA: Insufficient documentation

## 2017-12-24 DIAGNOSIS — Z931 Gastrostomy status: Secondary | ICD-10-CM | POA: Insufficient documentation

## 2017-12-24 DIAGNOSIS — Z951 Presence of aortocoronary bypass graft: Secondary | ICD-10-CM | POA: Insufficient documentation

## 2017-12-24 DIAGNOSIS — Z510 Encounter for antineoplastic radiation therapy: Secondary | ICD-10-CM | POA: Diagnosis not present

## 2017-12-24 DIAGNOSIS — I2510 Atherosclerotic heart disease of native coronary artery without angina pectoris: Secondary | ICD-10-CM | POA: Insufficient documentation

## 2017-12-24 DIAGNOSIS — I5042 Chronic combined systolic (congestive) and diastolic (congestive) heart failure: Secondary | ICD-10-CM

## 2017-12-24 DIAGNOSIS — N529 Male erectile dysfunction, unspecified: Secondary | ICD-10-CM | POA: Insufficient documentation

## 2017-12-24 DIAGNOSIS — I25810 Atherosclerosis of coronary artery bypass graft(s) without angina pectoris: Secondary | ICD-10-CM

## 2017-12-24 DIAGNOSIS — I4510 Unspecified right bundle-branch block: Secondary | ICD-10-CM | POA: Insufficient documentation

## 2017-12-24 DIAGNOSIS — C140 Malignant neoplasm of pharynx, unspecified: Secondary | ICD-10-CM | POA: Insufficient documentation

## 2017-12-24 DIAGNOSIS — C7989 Secondary malignant neoplasm of other specified sites: Secondary | ICD-10-CM

## 2017-12-24 DIAGNOSIS — I080 Rheumatic disorders of both mitral and aortic valves: Secondary | ICD-10-CM | POA: Insufficient documentation

## 2017-12-24 DIAGNOSIS — I255 Ischemic cardiomyopathy: Secondary | ICD-10-CM | POA: Diagnosis not present

## 2017-12-24 DIAGNOSIS — E119 Type 2 diabetes mellitus without complications: Secondary | ICD-10-CM | POA: Insufficient documentation

## 2017-12-24 DIAGNOSIS — R001 Bradycardia, unspecified: Secondary | ICD-10-CM | POA: Insufficient documentation

## 2017-12-24 DIAGNOSIS — Z79899 Other long term (current) drug therapy: Secondary | ICD-10-CM | POA: Insufficient documentation

## 2017-12-24 DIAGNOSIS — C801 Malignant (primary) neoplasm, unspecified: Secondary | ICD-10-CM | POA: Diagnosis not present

## 2017-12-24 DIAGNOSIS — Z95810 Presence of automatic (implantable) cardiac defibrillator: Secondary | ICD-10-CM | POA: Diagnosis not present

## 2017-12-24 DIAGNOSIS — I5022 Chronic systolic (congestive) heart failure: Secondary | ICD-10-CM | POA: Diagnosis not present

## 2017-12-24 DIAGNOSIS — M109 Gout, unspecified: Secondary | ICD-10-CM | POA: Insufficient documentation

## 2017-12-24 DIAGNOSIS — C770 Secondary and unspecified malignant neoplasm of lymph nodes of head, face and neck: Secondary | ICD-10-CM | POA: Diagnosis not present

## 2017-12-24 MED ORDER — FUROSEMIDE 40 MG PO TABS: 40.0000 mg | ORAL_TABLET | Freq: Every day | ORAL | 3 refills | Status: DC | PRN

## 2017-12-24 NOTE — Patient Instructions (Signed)
START Lasix 40 mg tablet once daily AS NEEDED for swelling/weight gain 3 lbs or more in 24 hours or 5 lbs or more weight gain in 1 week. Call CHF clinic if this becomes a frequent pattern (weekly or more).  Follow up 3 months with Dr. Haroldine Laws.  Take all medication as prescribed the day of your appointment. Bring all medications with you to your appointment.  Do the following things EVERYDAY: 1) Weigh yourself in the morning before breakfast. Write it down and keep it in a log. 2) Take your medicines as prescribed 3) Eat low salt foods-Limit salt (sodium) to 2000 mg per day.  4) Stay as active as you can everyday 5) Limit all fluids for the day to less than 2 liters

## 2017-12-24 NOTE — Progress Notes (Signed)
ADVANCED HF CLINIC NOTE  Patient ID: Matthew Joyce, male   DOB: [birth year], 67 y.o.   MRN: [id number] History of Present Illness: Primary Cardiologist:  Dr. Glori Bickers Primary Electrophysiologist:  Dr. Cristopher Peru PCP; Dr Clent Jacks is a 67 y.o. male with a history of a CAD S/P CABG in 2006, ICD, ischemic MR, DM, hyperlipidemia, and bradycardia.  B-blocker dosing limited by bradycardia. Had Myoview 5/12 with  an old scar on his images but no ischemia with EF of 36%.   - Stress test 08/14/2016 with EF 31%, old infarct, no ischemia.   He presents today for follow up. Last seen in 07/2016 after shock for VF. Echo stable and Myoview unremarkable.     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  Started chemo and radiation this week. Has PEG tube in place for expected dysphagia from throat radiation. Prior to diagnosis was still playing golf up to 4 times a week. Taking all medications as directed. Denies SOB or DOE. No CP. No peripheral edema. Retired since 2012  Corvue: Interrogated personally. Thoracic impedence below threshold. No VT/VF. No readout printed for pt activity  Echo 07/2016 LVEF 30-35%, Mod MR, Mod LAE, Mildly dilated RV with mild reduced RV function, PA peak pressure 51 mm Hg.   Echo 07/2012: EF ~30%.  Grade 2 diastolic dysfunction.  Mod MR.  Mild to mod dilated LA.  Mildly dilated RV with mildly reduced systolic function.   ECHO 08/2013 EF 45-50% Grade II DD Moderate MR  Labs 04/21/2016: K 4.0 Creatinine 0.86.  Labs 10/13/13 K 4.6 Creatinine 0.7  Lab Results  Component Value Date   CHOL 128 07/03/2017   HDL 49.80 07/03/2017   LDLCALC 63 07/03/2017   TRIG 76.0 07/03/2017   CHOLHDL 3 07/03/2017    No results found for: LDLDIRECT  Past Medical History:  Diagnosis Date  . AICD (automatic cardioverter/defibrillator) present  6/09   St. Jude  . Bradycardia    Use of beta blocker limited  . CAD (coronary artery disease)    a- S/p cabg in march 2006 by Dr.Owen;  b. myoview 5/12: large IL scar from apex to base, no ischemia, EF 37%  . DM (diabetes mellitus) ([place])   . ED (erectile dysfunction)   . Gout   . Hyperlipidemia   . Ischemic cardiomyopathy    a-Echocardiogram, Nov 2006, showed ejection fraction of 30-40% with mild to moderated mitral  regurgitation and mild aortic regurgitation b- Cardiac MRI, May 2007, EF of 44% with 50% scar involving  the inferolateral walls. No comment of mitral regurgitation. c- last echo  11/10: EF 30-35%  mod MR d- Nega T-Wave alternans testing in May of 2007 e- no ACE due to low BP;  f. CPX 3/11: normal  . 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 Systolic CHF, chronic (HCC)     Current Outpatient Medications  Medication Sig Dispense Refill  . aspirin 81 MG tablet Take 81 mg by mouth daily.      . candesartan (ATACAND) 4 MG tablet Take 1 tablet (4 mg total) by mouth daily. 90 tablet 3  . carboxymethylcellulose (REFRESH PLUS) 0.5 % SOLN Place 1 drop into both eyes daily as needed (dry eyes).     . cetirizine (ZYRTEC) 10 MG tablet Take 10 mg by  mouth daily.      . metoprolol succinate (TOPROL-XL) 25 MG 24 hr tablet Take 0.5 tablets (12.5 mg total) by mouth 2 (two) times daily. NEEDS FOLLOW UP 90 tablet 3  . nitroGLYCERIN (NITROSTAT) 0.4 MG SL tablet Place 0.4 mg under the tongue every 5 (five) minutes as needed for chest pain (MAX 3 TABLETS).    . Omega-3 Fatty Acids (FISH OIL) 1000 MG CAPS Take 3 capsules by mouth daily.    . rosuvastatin (CRESTOR) 40 MG tablet Take 1 tablet (40 mg total) by mouth at bedtime. 30 tablet 6   No current facility-administered medications for this encounter.     Allergies  Allergen Reactions  . Atorvastatin     REACTION: myalgia  . Sulfonamide Derivatives     Rash and hot flashes  . Tape Other (See Comments)     Rips skin- use PAPER tape  . Sulfamethoxazole Rash    Pt turns red   Vital Signs:  Vitals:   12/24/17 1400  BP: 126/82  Pulse: (!) 58  SpO2: 97%  Weight: 198 lb (89.8 kg)   Wt Readings from Last 3 Encounters:  12/24/17 198 lb (89.8 kg)  11/02/17 198 lb 8 oz (90 kg)  05/05/17 191 lb 3.2 oz (86.7 kg)    PHYSICAL EXAM: General: Well appearing. No resp difficulty. HEENT: Normal.  Neck: Supple. JVP 5-6. Carotids 2+ bilat; no bruits. No thyromegaly. Large R-sided neck mass tender to palpation Cor: PMI nondisplaced. RRR, No M/G/R noted. R chest Port.  Lungs: CTAB, normal effort. Abdomen: Soft, non-tender, non-distended, no HSM. No bruits or masses. +BS L abdomen PEG tube. Extremities: No cyanosis, clubbing, or rash. R and LLE no edema.  Neuro: Alert & orientedx3, cranial nerves grossly intact. moves all 4 extremities w/o difficulty. Affect pleasant    ASSESSMENT AND PLAN:  1. Chronic Systolic Heart Failure ICM ECHO 08/2013 EF 45-50%  Has St Jude ICD 2009 -  Echo 07/2016 LVEF 30-35%, Mod MR, Mod LAE, Mildly dilated RV with mild reduced RV function, PA peak pressure 51 mm Hg.  - NYHA I symptoms - Volume status stable on exam, though trending up on Corevue after chemo earlier this week. - Will give lasix 40 mg AS NEEDED in case he needs from chemo hydration.  - Continue Toprol XL 12.5 mg BID.  - Continue Atacand 4 mg daily.  - Reinforced fluid restriction to < 2 L daily, sodium restriction to less than 2000 mg daily, and the importance of daily weights.  2. CAD S/P CABG 2006 - No s/s of ischemia.    - Continue aspirin and simvastatin 3. Hyperlipidemia Continue simvastatin 40 mg at bed time.  - Per PCP 4. RBBB 5.  Shock for VF 06/09/16 - No further.  - Follows with [name].  - Stress test 08/14/2016 with EF 31%, old infarct, no ischemia.  6. Throat Cancer - on R side. Squamous Cell Carcinoma with unclear primary.  - Started chemo and radiation this week. No plans for surgery  per patient - Sees Dr. [name] and [name] in [facility]. - Will give lasix 40 mg AS NEEDED in case he needs from chemo hydration.   Doing well from HF perspective, but now with Throat CA. Will give as needed lasix in case of edema/SOB with hydration from chemo.   RTC 2-3 to see Dr. Haroldine Laws. Pt knows to call with any questions or concerns.   Shirley Friar, PA-C 2:14 PM  Patient seen and examined with the above-signed Advanced Practice Provider and/or Housestaff. I personally reviewed laboratory data, imaging studies and relevant notes. I independently examined the patient and formulated the important aspects of the plan. I have edited the note to reflect any of my changes or salient points. I have personally discussed the plan with the patient and/or family.  Doing well from cardiac standpoint. Unfortunately found to have throat CA and now undergoing treatment. ICD interrogation shows mild volume overload likely due to fluid loading from chemo. No recurrent VT/VF. Have instructed him to take lasix as needed for volume overload.   Glori Bickers, MD  10:03 PM

## 2017-12-25 DIAGNOSIS — C801 Malignant (primary) neoplasm, unspecified: Secondary | ICD-10-CM | POA: Diagnosis not present

## 2017-12-25 DIAGNOSIS — Z510 Encounter for antineoplastic radiation therapy: Secondary | ICD-10-CM | POA: Diagnosis not present

## 2017-12-25 DIAGNOSIS — C770 Secondary and unspecified malignant neoplasm of lymph nodes of head, face and neck: Secondary | ICD-10-CM | POA: Diagnosis not present

## 2017-12-28 DIAGNOSIS — C801 Malignant (primary) neoplasm, unspecified: Secondary | ICD-10-CM | POA: Diagnosis not present

## 2017-12-28 DIAGNOSIS — Z510 Encounter for antineoplastic radiation therapy: Secondary | ICD-10-CM | POA: Diagnosis not present

## 2017-12-28 DIAGNOSIS — Z8739 Personal history of other diseases of the musculoskeletal system and connective tissue: Secondary | ICD-10-CM | POA: Diagnosis not present

## 2017-12-28 DIAGNOSIS — C770 Secondary and unspecified malignant neoplasm of lymph nodes of head, face and neck: Secondary | ICD-10-CM | POA: Diagnosis not present

## 2017-12-29 DIAGNOSIS — C801 Malignant (primary) neoplasm, unspecified: Secondary | ICD-10-CM | POA: Diagnosis not present

## 2017-12-29 DIAGNOSIS — Z510 Encounter for antineoplastic radiation therapy: Secondary | ICD-10-CM | POA: Diagnosis not present

## 2017-12-29 DIAGNOSIS — C770 Secondary and unspecified malignant neoplasm of lymph nodes of head, face and neck: Secondary | ICD-10-CM | POA: Diagnosis not present

## 2017-12-30 DIAGNOSIS — C770 Secondary and unspecified malignant neoplasm of lymph nodes of head, face and neck: Secondary | ICD-10-CM | POA: Diagnosis not present

## 2017-12-30 DIAGNOSIS — Z510 Encounter for antineoplastic radiation therapy: Secondary | ICD-10-CM | POA: Diagnosis not present

## 2017-12-30 DIAGNOSIS — C801 Malignant (primary) neoplasm, unspecified: Secondary | ICD-10-CM | POA: Diagnosis not present

## 2017-12-31 DIAGNOSIS — C801 Malignant (primary) neoplasm, unspecified: Secondary | ICD-10-CM | POA: Diagnosis not present

## 2017-12-31 DIAGNOSIS — Z510 Encounter for antineoplastic radiation therapy: Secondary | ICD-10-CM | POA: Diagnosis not present

## 2017-12-31 DIAGNOSIS — C770 Secondary and unspecified malignant neoplasm of lymph nodes of head, face and neck: Secondary | ICD-10-CM | POA: Diagnosis not present

## 2018-01-01 DIAGNOSIS — C801 Malignant (primary) neoplasm, unspecified: Secondary | ICD-10-CM | POA: Diagnosis not present

## 2018-01-01 DIAGNOSIS — Z510 Encounter for antineoplastic radiation therapy: Secondary | ICD-10-CM | POA: Diagnosis not present

## 2018-01-01 DIAGNOSIS — C770 Secondary and unspecified malignant neoplasm of lymph nodes of head, face and neck: Secondary | ICD-10-CM | POA: Diagnosis not present

## 2018-01-04 ENCOUNTER — Encounter: Payer: Self-pay | Admitting: *Deleted

## 2018-01-05 DIAGNOSIS — C801 Malignant (primary) neoplasm, unspecified: Secondary | ICD-10-CM | POA: Diagnosis not present

## 2018-01-05 DIAGNOSIS — Z510 Encounter for antineoplastic radiation therapy: Secondary | ICD-10-CM | POA: Diagnosis not present

## 2018-01-05 DIAGNOSIS — C770 Secondary and unspecified malignant neoplasm of lymph nodes of head, face and neck: Secondary | ICD-10-CM | POA: Diagnosis not present

## 2018-01-06 DIAGNOSIS — C801 Malignant (primary) neoplasm, unspecified: Secondary | ICD-10-CM | POA: Diagnosis not present

## 2018-01-06 DIAGNOSIS — Z510 Encounter for antineoplastic radiation therapy: Secondary | ICD-10-CM | POA: Diagnosis not present

## 2018-01-06 DIAGNOSIS — C770 Secondary and unspecified malignant neoplasm of lymph nodes of head, face and neck: Secondary | ICD-10-CM | POA: Diagnosis not present

## 2018-01-07 DIAGNOSIS — C801 Malignant (primary) neoplasm, unspecified: Secondary | ICD-10-CM | POA: Diagnosis not present

## 2018-01-07 DIAGNOSIS — Z510 Encounter for antineoplastic radiation therapy: Secondary | ICD-10-CM | POA: Diagnosis not present

## 2018-01-07 DIAGNOSIS — C770 Secondary and unspecified malignant neoplasm of lymph nodes of head, face and neck: Secondary | ICD-10-CM | POA: Diagnosis not present

## 2018-01-08 DIAGNOSIS — C801 Malignant (primary) neoplasm, unspecified: Secondary | ICD-10-CM | POA: Diagnosis not present

## 2018-01-08 DIAGNOSIS — C770 Secondary and unspecified malignant neoplasm of lymph nodes of head, face and neck: Secondary | ICD-10-CM | POA: Diagnosis not present

## 2018-01-08 DIAGNOSIS — Z510 Encounter for antineoplastic radiation therapy: Secondary | ICD-10-CM | POA: Diagnosis not present

## 2018-01-11 DIAGNOSIS — C770 Secondary and unspecified malignant neoplasm of lymph nodes of head, face and neck: Secondary | ICD-10-CM | POA: Diagnosis not present

## 2018-01-11 DIAGNOSIS — C801 Malignant (primary) neoplasm, unspecified: Secondary | ICD-10-CM | POA: Diagnosis not present

## 2018-01-11 DIAGNOSIS — Z510 Encounter for antineoplastic radiation therapy: Secondary | ICD-10-CM | POA: Diagnosis not present

## 2018-01-12 DIAGNOSIS — C760 Malignant neoplasm of head, face and neck: Secondary | ICD-10-CM | POA: Diagnosis not present

## 2018-01-12 DIAGNOSIS — C770 Secondary and unspecified malignant neoplasm of lymph nodes of head, face and neck: Secondary | ICD-10-CM | POA: Diagnosis not present

## 2018-01-12 DIAGNOSIS — C801 Malignant (primary) neoplasm, unspecified: Secondary | ICD-10-CM | POA: Diagnosis not present

## 2018-01-12 DIAGNOSIS — Z510 Encounter for antineoplastic radiation therapy: Secondary | ICD-10-CM | POA: Diagnosis not present

## 2018-01-13 DIAGNOSIS — Z510 Encounter for antineoplastic radiation therapy: Secondary | ICD-10-CM | POA: Diagnosis not present

## 2018-01-13 DIAGNOSIS — C770 Secondary and unspecified malignant neoplasm of lymph nodes of head, face and neck: Secondary | ICD-10-CM | POA: Diagnosis not present

## 2018-01-13 DIAGNOSIS — C801 Malignant (primary) neoplasm, unspecified: Secondary | ICD-10-CM | POA: Diagnosis not present

## 2018-01-14 ENCOUNTER — Other Ambulatory Visit: Payer: Self-pay | Admitting: Internal Medicine

## 2018-01-14 DIAGNOSIS — C801 Malignant (primary) neoplasm, unspecified: Secondary | ICD-10-CM | POA: Diagnosis not present

## 2018-01-14 DIAGNOSIS — C770 Secondary and unspecified malignant neoplasm of lymph nodes of head, face and neck: Secondary | ICD-10-CM | POA: Diagnosis not present

## 2018-01-14 DIAGNOSIS — Z510 Encounter for antineoplastic radiation therapy: Secondary | ICD-10-CM | POA: Diagnosis not present

## 2018-01-15 DIAGNOSIS — C770 Secondary and unspecified malignant neoplasm of lymph nodes of head, face and neck: Secondary | ICD-10-CM | POA: Diagnosis not present

## 2018-01-15 DIAGNOSIS — C801 Malignant (primary) neoplasm, unspecified: Secondary | ICD-10-CM | POA: Diagnosis not present

## 2018-01-15 DIAGNOSIS — Z510 Encounter for antineoplastic radiation therapy: Secondary | ICD-10-CM | POA: Diagnosis not present

## 2018-01-18 ENCOUNTER — Other Ambulatory Visit: Payer: Self-pay | Admitting: Internal Medicine

## 2018-01-18 DIAGNOSIS — C801 Malignant (primary) neoplasm, unspecified: Secondary | ICD-10-CM | POA: Diagnosis not present

## 2018-01-18 DIAGNOSIS — Z510 Encounter for antineoplastic radiation therapy: Secondary | ICD-10-CM | POA: Diagnosis not present

## 2018-01-18 DIAGNOSIS — C760 Malignant neoplasm of head, face and neck: Secondary | ICD-10-CM | POA: Diagnosis not present

## 2018-01-18 DIAGNOSIS — C770 Secondary and unspecified malignant neoplasm of lymph nodes of head, face and neck: Secondary | ICD-10-CM | POA: Diagnosis not present

## 2018-01-19 ENCOUNTER — Encounter: Payer: Medicare Other | Admitting: Internal Medicine

## 2018-01-19 DIAGNOSIS — C760 Malignant neoplasm of head, face and neck: Secondary | ICD-10-CM | POA: Diagnosis not present

## 2018-01-19 DIAGNOSIS — Z510 Encounter for antineoplastic radiation therapy: Secondary | ICD-10-CM | POA: Diagnosis not present

## 2018-01-19 DIAGNOSIS — C801 Malignant (primary) neoplasm, unspecified: Secondary | ICD-10-CM | POA: Diagnosis not present

## 2018-01-19 DIAGNOSIS — C770 Secondary and unspecified malignant neoplasm of lymph nodes of head, face and neck: Secondary | ICD-10-CM | POA: Diagnosis not present

## 2018-01-19 DIAGNOSIS — D649 Anemia, unspecified: Secondary | ICD-10-CM | POA: Diagnosis not present

## 2018-01-20 DIAGNOSIS — C770 Secondary and unspecified malignant neoplasm of lymph nodes of head, face and neck: Secondary | ICD-10-CM | POA: Diagnosis not present

## 2018-01-20 DIAGNOSIS — C760 Malignant neoplasm of head, face and neck: Secondary | ICD-10-CM | POA: Diagnosis not present

## 2018-01-20 DIAGNOSIS — C801 Malignant (primary) neoplasm, unspecified: Secondary | ICD-10-CM | POA: Diagnosis not present

## 2018-01-20 DIAGNOSIS — Z510 Encounter for antineoplastic radiation therapy: Secondary | ICD-10-CM | POA: Diagnosis not present

## 2018-01-21 ENCOUNTER — Encounter: Payer: Self-pay | Admitting: Internal Medicine

## 2018-01-21 ENCOUNTER — Encounter: Payer: Self-pay | Admitting: *Deleted

## 2018-01-21 ENCOUNTER — Ambulatory Visit: Payer: Medicare HMO | Admitting: Internal Medicine

## 2018-01-21 VITALS — BP 124/64 | HR 60 | Ht 69.5 in | Wt 192.0 lb

## 2018-01-21 DIAGNOSIS — I5022 Chronic systolic (congestive) heart failure: Secondary | ICD-10-CM

## 2018-01-21 DIAGNOSIS — Z95810 Presence of automatic (implantable) cardiac defibrillator: Secondary | ICD-10-CM | POA: Diagnosis not present

## 2018-01-21 DIAGNOSIS — C760 Malignant neoplasm of head, face and neck: Secondary | ICD-10-CM | POA: Diagnosis not present

## 2018-01-21 DIAGNOSIS — Z510 Encounter for antineoplastic radiation therapy: Secondary | ICD-10-CM | POA: Diagnosis not present

## 2018-01-21 DIAGNOSIS — R1310 Dysphagia, unspecified: Secondary | ICD-10-CM | POA: Insufficient documentation

## 2018-01-21 DIAGNOSIS — C770 Secondary and unspecified malignant neoplasm of lymph nodes of head, face and neck: Secondary | ICD-10-CM | POA: Diagnosis not present

## 2018-01-21 DIAGNOSIS — C801 Malignant (primary) neoplasm, unspecified: Secondary | ICD-10-CM | POA: Diagnosis not present

## 2018-01-21 NOTE — Progress Notes (Signed)
HPI Mr. Laughter returns today for ongoing evaluation and management of chronic systolic heart failure and his ICD. He has CAD, s/p MI. 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 He has undergone XRT and chemotherapy. He received a port a cath and a PEG tube for which he has not had to use. He has less energy and no stamina.  Allergies  Allergen Reactions  . Atorvastatin     REACTION: myalgia  . Sulfonamide Derivatives     Rash and hot flashes  . Tape Other (See Comments)    Rips skin- use PAPER tape  . Sulfamethoxazole Rash    Pt turns red     Current Outpatient Medications  Medication Sig Dispense Refill  . aspirin 81 MG tablet Take 81 mg by mouth daily.      . candesartan (ATACAND) 4 MG tablet Take 1 tablet (4 mg total) by mouth daily. 90 tablet 3  . carboxymethylcellulose (REFRESH PLUS) 0.5 % SOLN Place 1 drop into both eyes daily as needed (dry eyes).     . cetirizine (ZYRTEC) 10 MG tablet Take 10 mg by mouth daily.      . furosemide (LASIX) 40 MG tablet Take 1 tablet (40 mg total) by mouth daily as needed. For swelling/weight gain 3 lbs or more in 24 hrs, or 5 lbs or more in 1 week. 90 tablet 3  . metoprolol succinate (TOPROL-XL) 25 MG 24 hr tablet Take 0.5 tablets (12.5 mg total) by mouth 2 (two) times daily. NEEDS FOLLOW UP 90 tablet 3  . nitroGLYCERIN (NITROSTAT) 0.4 MG SL tablet Place 0.4 mg under the tongue every 5 (five) minutes as needed for chest pain (MAX 3 TABLETS).    . Omega-3 Fatty Acids (FISH OIL) 1000 MG CAPS Take 3 capsules by mouth daily.    . rosuvastatin (CRESTOR) 40 MG tablet Take 1 tablet (40 mg total) by mouth at bedtime. 90 tablet 0   No current facility-administered medications for this visit.      Past Medical History:  Diagnosis Date  . AICD (automatic cardioverter/defibrillator) present 6/09   St. Jude  . Bradycardia    Use of beta blocker limited  . CAD (coronary artery disease)    a-  S/p cabg in march 2006 by Dr.Owen;  b. myoview 5/12: large IL scar from apex to base, no ischemia, EF 37%  . DM (diabetes mellitus) ([place])   . ED (erectile dysfunction)   . Gout   . Hyperlipidemia   . Ischemic cardiomyopathy    a-Echocardiogram, Nov 2006, showed ejection fraction of 30-40% with mild to moderated mitral  regurgitation and mild aortic regurgitation b- Cardiac MRI, May 2007, EF of 44% with 50% scar involving  the inferolateral walls. No comment of mitral regurgitation. c- last echo  11/10: EF 30-35%  mod MR d- Nega T-Wave alternans testing in May of 2007 e- no ACE due to low BP;  f. CPX 3/11: normal  . 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 Systolic CHF, chronic (HCC)     ROS:   All systems reviewed and negative except as noted in the HPI.   Past Surgical History:  Procedure Laterality Date  . CARDIAC DEFIBRILLATOR PLACEMENT  06/01/08   ICD  . CORONARY ARTERY BYPASS GRAFT  02-2005  . ICD GENERATOR CHANGEOUT N/A 02/03/2017   Procedure: ICD Generator Changeout;  Surgeon: [name],  MD;  Location: [facility];  Service: Cardiovascular;  Laterality: N/A;  . ORIF [place]   left   . SHOULDER ARTHROSCOPY  02/2010   rt   . TONSILLECTOMY     as a child     Family History  Problem Relation Age of Onset  . Heart attack Father 76  . Cardiomyopathy Brother   . Colon cancer Neg Hx   . Prostate cancer Neg Hx   . Diabetes Neg Hx      Social History   Socioeconomic History  . Marital status: Married    Spouse name: Not on file  . Number of children: 2  . Years of education: Not on file  . Highest education level: Not on file  Social Needs  . Financial resource strain: Not on file  . Food insecurity - worry: Not on file  . Food insecurity - inability: Not on file  . Transportation needs - medical: Not on file  . Transportation needs - non-medical: Not on file  Occupational History  . 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 Smoking status: Former Smoker    Packs/day: 0.10    Types: Cigarettes    Last attempt to quit: 12/16/1999    Years since quitting: 18.1  . Smokeless tobacco: Never Used  Substance and Sexual Activity  . Alcohol use: Yes    Alcohol/week: 0.0 oz    Comment: socially  . Drug use: No  . Sexual activity: Not on file  Other Topics Concern  . Not on file  Social History Narrative   Household- pt and wife   2 adult children in [place]     BP 124/64   Pulse 60   Ht 5' 9.5" (1.765 m)   Wt 192 lb (87.1 kg)   SpO2 96%   BMI 27.95 kg/m   Physical Exam:  Well appearing 67 yo man, NAD HEENT: Unremarkable Neck:  No JVD, no thyromegally Lymphatics:  No adenopathy Back:  No CVA tenderness Lungs:  Clear with no wheezes HEART:  Regular rate rhythm, no murmurs, no rubs, no clicks Abd:  soft, positive bowel sounds, no organomegally, no rebound, no guarding Ext:  2 plus pulses, no edema, no cyanosis, no clubbing Skin:  No rashes no nodules Neuro:  CN II through XII intact, motor grossly intact  DEVICE  Normal device function.  See PaceArt for details.   Assess/Plan: 1. Chronic systolic heart failure - his symptoms are class 2. He will continue his current meds. 2. ICD - his St. Jude single chamber device has been interrogated today and is working normally.  3. Squamous cell CA - he is undergoing XRT and chemo and appears to be doing well.   [name].D.

## 2018-01-21 NOTE — Patient Instructions (Signed)
Medication Instructions:  Your physician recommends that you continue on your current medications as directed. Please refer to the Current Medication list given to you today.  Labwork: None ordered.  Testing/Procedures: None ordered.  Follow-Up: Your physician wants you to follow-up in: one year with [name].   You will receive a reminder letter in the mail two months in advance. If you don't receive a letter, please call our office to schedule the follow-up appointment.  Remote monitoring is used to monitor your ICD from home. This monitoring reduces the number of office visits required to check your device to one time per year. It allows [place] to keep an eye on the functioning of your device to ensure it is working properly. You are scheduled for a device check from home on 02/02/2018. You may send your transmission at any time that day. If you have a wireless device, the transmission will be sent automatically. After your physician reviews your transmission, you will receive a postcard with your next transmission date.  Any Other Special Instructions Will Be Listed Below (If Applicable).  If you need a refill on your cardiac medications before your next appointment, please call your pharmacy.

## 2018-01-22 ENCOUNTER — Other Ambulatory Visit: Payer: Self-pay | Admitting: Internal Medicine

## 2018-01-22 DIAGNOSIS — C760 Malignant neoplasm of head, face and neck: Secondary | ICD-10-CM | POA: Diagnosis not present

## 2018-01-22 DIAGNOSIS — Z510 Encounter for antineoplastic radiation therapy: Secondary | ICD-10-CM | POA: Diagnosis not present

## 2018-01-22 DIAGNOSIS — C770 Secondary and unspecified malignant neoplasm of lymph nodes of head, face and neck: Secondary | ICD-10-CM | POA: Diagnosis not present

## 2018-01-22 DIAGNOSIS — C801 Malignant (primary) neoplasm, unspecified: Secondary | ICD-10-CM | POA: Diagnosis not present

## 2018-01-25 ENCOUNTER — Telehealth: Payer: Self-pay | Admitting: Internal Medicine

## 2018-01-25 DIAGNOSIS — Z510 Encounter for antineoplastic radiation therapy: Secondary | ICD-10-CM | POA: Diagnosis not present

## 2018-01-25 DIAGNOSIS — C760 Malignant neoplasm of head, face and neck: Secondary | ICD-10-CM | POA: Diagnosis not present

## 2018-01-25 DIAGNOSIS — C801 Malignant (primary) neoplasm, unspecified: Secondary | ICD-10-CM | POA: Diagnosis not present

## 2018-01-25 DIAGNOSIS — C770 Secondary and unspecified malignant neoplasm of lymph nodes of head, face and neck: Secondary | ICD-10-CM | POA: Diagnosis not present

## 2018-01-25 MED ORDER — COLCHICINE 0.6 MG PO CAPS: 0.6000 mg | ORAL_CAPSULE | Freq: Two times a day (BID) | ORAL | 0 refills | Status: AC | PRN

## 2018-01-25 NOTE — Telephone Encounter (Signed)
Spoke w/ Pt- informed of recommendations. Pt has already spoke w/ oncologist this morning- they are okay w/ colchicine prn. Rx sent to [place] [facility]. Pt to let us know if not improving.

## 2018-01-25 NOTE — Telephone Encounter (Signed)
Copied from [place]. Topic: Quick Communication - See Telephone Encounter >> Jan 25, 2018  8:52 AM Matthew Joyce wrote: CRM for notification. See Telephone encounter for:   01/25/18.  Patient states that he has had a flare up again with his gout in his big toe and ankle on his left foot. Flared up the other day in the right but has eased off a little. He said he wants to know if Dr Larose Kells would call him in another script for this.  [place], [place]

## 2018-01-25 NOTE — Telephone Encounter (Signed)
Advised patient: 1.  Pt to notify his oncologist regards the  gout episode 2.  Call colchicine 0.6 mg 1 tablet twice a day until better #60 no refills. 3.  If he is not improving or if he has any doubts that this is gout: Needs office visit.

## 2018-01-25 NOTE — Telephone Encounter (Signed)
Please advise 

## 2018-01-26 DIAGNOSIS — C770 Secondary and unspecified malignant neoplasm of lymph nodes of head, face and neck: Secondary | ICD-10-CM | POA: Diagnosis not present

## 2018-01-26 DIAGNOSIS — Z510 Encounter for antineoplastic radiation therapy: Secondary | ICD-10-CM | POA: Diagnosis not present

## 2018-01-26 DIAGNOSIS — C801 Malignant (primary) neoplasm, unspecified: Secondary | ICD-10-CM | POA: Diagnosis not present

## 2018-01-26 DIAGNOSIS — Z5111 Encounter for antineoplastic chemotherapy: Secondary | ICD-10-CM | POA: Diagnosis not present

## 2018-01-26 DIAGNOSIS — C760 Malignant neoplasm of head, face and neck: Secondary | ICD-10-CM | POA: Diagnosis not present

## 2018-01-26 DIAGNOSIS — D6959 Other secondary thrombocytopenia: Secondary | ICD-10-CM | POA: Diagnosis not present

## 2018-01-27 DIAGNOSIS — C801 Malignant (primary) neoplasm, unspecified: Secondary | ICD-10-CM | POA: Diagnosis not present

## 2018-01-27 DIAGNOSIS — Z510 Encounter for antineoplastic radiation therapy: Secondary | ICD-10-CM | POA: Diagnosis not present

## 2018-01-27 DIAGNOSIS — C770 Secondary and unspecified malignant neoplasm of lymph nodes of head, face and neck: Secondary | ICD-10-CM | POA: Diagnosis not present

## 2018-01-27 DIAGNOSIS — C760 Malignant neoplasm of head, face and neck: Secondary | ICD-10-CM | POA: Diagnosis not present

## 2018-01-28 DIAGNOSIS — C760 Malignant neoplasm of head, face and neck: Secondary | ICD-10-CM | POA: Diagnosis not present

## 2018-01-28 DIAGNOSIS — C770 Secondary and unspecified malignant neoplasm of lymph nodes of head, face and neck: Secondary | ICD-10-CM | POA: Diagnosis not present

## 2018-01-28 DIAGNOSIS — C801 Malignant (primary) neoplasm, unspecified: Secondary | ICD-10-CM | POA: Diagnosis not present

## 2018-01-28 DIAGNOSIS — Z510 Encounter for antineoplastic radiation therapy: Secondary | ICD-10-CM | POA: Diagnosis not present

## 2018-01-29 DIAGNOSIS — C801 Malignant (primary) neoplasm, unspecified: Secondary | ICD-10-CM | POA: Diagnosis not present

## 2018-01-29 DIAGNOSIS — C760 Malignant neoplasm of head, face and neck: Secondary | ICD-10-CM | POA: Diagnosis not present

## 2018-01-29 DIAGNOSIS — Z510 Encounter for antineoplastic radiation therapy: Secondary | ICD-10-CM | POA: Diagnosis not present

## 2018-01-29 DIAGNOSIS — C770 Secondary and unspecified malignant neoplasm of lymph nodes of head, face and neck: Secondary | ICD-10-CM | POA: Diagnosis not present

## 2018-02-01 DIAGNOSIS — C760 Malignant neoplasm of head, face and neck: Secondary | ICD-10-CM | POA: Diagnosis not present

## 2018-02-01 DIAGNOSIS — C801 Malignant (primary) neoplasm, unspecified: Secondary | ICD-10-CM | POA: Diagnosis not present

## 2018-02-01 DIAGNOSIS — C770 Secondary and unspecified malignant neoplasm of lymph nodes of head, face and neck: Secondary | ICD-10-CM | POA: Diagnosis not present

## 2018-02-01 DIAGNOSIS — Z510 Encounter for antineoplastic radiation therapy: Secondary | ICD-10-CM | POA: Diagnosis not present

## 2018-02-02 ENCOUNTER — Encounter: Payer: Self-pay | Admitting: Internal Medicine

## 2018-02-02 ENCOUNTER — Ambulatory Visit (INDEPENDENT_AMBULATORY_CARE_PROVIDER_SITE_OTHER): Payer: Medicare HMO | Admitting: *Deleted

## 2018-02-02 DIAGNOSIS — C760 Malignant neoplasm of head, face and neck: Secondary | ICD-10-CM | POA: Diagnosis not present

## 2018-02-02 DIAGNOSIS — D72819 Decreased white blood cell count, unspecified: Secondary | ICD-10-CM | POA: Diagnosis not present

## 2018-02-02 DIAGNOSIS — C801 Malignant (primary) neoplasm, unspecified: Secondary | ICD-10-CM | POA: Diagnosis not present

## 2018-02-02 DIAGNOSIS — Z510 Encounter for antineoplastic radiation therapy: Secondary | ICD-10-CM | POA: Diagnosis not present

## 2018-02-02 DIAGNOSIS — I472 Ventricular tachycardia, unspecified: Secondary | ICD-10-CM

## 2018-02-02 DIAGNOSIS — C770 Secondary and unspecified malignant neoplasm of lymph nodes of head, face and neck: Secondary | ICD-10-CM | POA: Diagnosis not present

## 2018-02-02 NOTE — Progress Notes (Signed)
Remote ICD transmission.   

## 2018-02-03 DIAGNOSIS — Z510 Encounter for antineoplastic radiation therapy: Secondary | ICD-10-CM | POA: Diagnosis not present

## 2018-02-03 DIAGNOSIS — C801 Malignant (primary) neoplasm, unspecified: Secondary | ICD-10-CM | POA: Diagnosis not present

## 2018-02-03 DIAGNOSIS — C770 Secondary and unspecified malignant neoplasm of lymph nodes of head, face and neck: Secondary | ICD-10-CM | POA: Diagnosis not present

## 2018-02-03 DIAGNOSIS — C760 Malignant neoplasm of head, face and neck: Secondary | ICD-10-CM | POA: Diagnosis not present

## 2018-02-03 LAB — CUP PACEART REMOTE DEVICE CHECK
Date Time Interrogation Session: 20190219090024
HIGH POWER IMPEDANCE MEASURED VALUE: 51 Ohm
HighPow Impedance: 50 Ohm
Implantable Lead Location: 753860
Implantable Pulse Generator Implant Date: 20180220
Lead Channel Setting Pacing Amplitude: 2.5 V
Lead Channel Setting Pacing Pulse Width: 0.8 ms
MDC IDC LEAD IMPLANT DT: 20090618
MDC IDC MSMT BATTERY REMAINING LONGEVITY: 92 mo
MDC IDC MSMT BATTERY REMAINING PERCENTAGE: 91 %
MDC IDC MSMT BATTERY VOLTAGE: 3.11 V
MDC IDC MSMT LEADCHNL RV IMPEDANCE VALUE: 400 Ohm
MDC IDC MSMT LEADCHNL RV PACING THRESHOLD AMPLITUDE: 1 V
MDC IDC MSMT LEADCHNL RV PACING THRESHOLD PULSEWIDTH: 0.8 ms
MDC IDC MSMT LEADCHNL RV SENSING INTR AMPL: 6.5 mV
MDC IDC SET LEADCHNL RV SENSING SENSITIVITY: 0.5 mV
MDC IDC STAT BRADY RV PERCENT PACED: 1 %
Pulse Gen Serial Number: 7406967

## 2018-02-04 ENCOUNTER — Encounter: Payer: Self-pay | Admitting: Cardiology

## 2018-02-04 DIAGNOSIS — C770 Secondary and unspecified malignant neoplasm of lymph nodes of head, face and neck: Secondary | ICD-10-CM | POA: Diagnosis not present

## 2018-02-04 DIAGNOSIS — Z510 Encounter for antineoplastic radiation therapy: Secondary | ICD-10-CM | POA: Diagnosis not present

## 2018-02-04 DIAGNOSIS — C801 Malignant (primary) neoplasm, unspecified: Secondary | ICD-10-CM | POA: Diagnosis not present

## 2018-02-04 DIAGNOSIS — C760 Malignant neoplasm of head, face and neck: Secondary | ICD-10-CM | POA: Diagnosis not present

## 2018-02-05 DIAGNOSIS — C770 Secondary and unspecified malignant neoplasm of lymph nodes of head, face and neck: Secondary | ICD-10-CM | POA: Diagnosis not present

## 2018-02-05 DIAGNOSIS — C801 Malignant (primary) neoplasm, unspecified: Secondary | ICD-10-CM | POA: Diagnosis not present

## 2018-02-05 DIAGNOSIS — Z510 Encounter for antineoplastic radiation therapy: Secondary | ICD-10-CM | POA: Diagnosis not present

## 2018-02-05 DIAGNOSIS — C760 Malignant neoplasm of head, face and neck: Secondary | ICD-10-CM | POA: Diagnosis not present

## 2018-02-08 DIAGNOSIS — C801 Malignant (primary) neoplasm, unspecified: Secondary | ICD-10-CM | POA: Diagnosis not present

## 2018-02-08 DIAGNOSIS — C760 Malignant neoplasm of head, face and neck: Secondary | ICD-10-CM | POA: Diagnosis not present

## 2018-02-08 DIAGNOSIS — C770 Secondary and unspecified malignant neoplasm of lymph nodes of head, face and neck: Secondary | ICD-10-CM | POA: Diagnosis not present

## 2018-02-08 DIAGNOSIS — Z510 Encounter for antineoplastic radiation therapy: Secondary | ICD-10-CM | POA: Diagnosis not present

## 2018-02-18 DIAGNOSIS — C770 Secondary and unspecified malignant neoplasm of lymph nodes of head, face and neck: Secondary | ICD-10-CM | POA: Diagnosis not present

## 2018-02-18 DIAGNOSIS — D649 Anemia, unspecified: Secondary | ICD-10-CM | POA: Diagnosis not present

## 2018-02-18 DIAGNOSIS — C801 Malignant (primary) neoplasm, unspecified: Secondary | ICD-10-CM | POA: Diagnosis not present

## 2018-03-10 DIAGNOSIS — Z4589 Encounter for adjustment and management of other implanted devices: Secondary | ICD-10-CM | POA: Diagnosis not present

## 2018-03-10 DIAGNOSIS — Z9221 Personal history of antineoplastic chemotherapy: Secondary | ICD-10-CM | POA: Diagnosis not present

## 2018-03-10 DIAGNOSIS — Z452 Encounter for adjustment and management of vascular access device: Secondary | ICD-10-CM | POA: Diagnosis not present

## 2018-03-10 DIAGNOSIS — Z923 Personal history of irradiation: Secondary | ICD-10-CM | POA: Diagnosis not present

## 2018-03-10 DIAGNOSIS — C801 Malignant (primary) neoplasm, unspecified: Secondary | ICD-10-CM | POA: Diagnosis not present

## 2018-03-10 DIAGNOSIS — C770 Secondary and unspecified malignant neoplasm of lymph nodes of head, face and neck: Secondary | ICD-10-CM | POA: Diagnosis not present

## 2018-03-10 DIAGNOSIS — Z431 Encounter for attention to gastrostomy: Secondary | ICD-10-CM | POA: Diagnosis not present

## 2018-03-12 DIAGNOSIS — C770 Secondary and unspecified malignant neoplasm of lymph nodes of head, face and neck: Secondary | ICD-10-CM | POA: Diagnosis not present

## 2018-03-12 DIAGNOSIS — C801 Malignant (primary) neoplasm, unspecified: Secondary | ICD-10-CM | POA: Diagnosis not present

## 2018-04-01 DIAGNOSIS — D649 Anemia, unspecified: Secondary | ICD-10-CM | POA: Diagnosis not present

## 2018-04-01 DIAGNOSIS — C770 Secondary and unspecified malignant neoplasm of lymph nodes of head, face and neck: Secondary | ICD-10-CM | POA: Diagnosis not present

## 2018-04-01 DIAGNOSIS — C801 Malignant (primary) neoplasm, unspecified: Secondary | ICD-10-CM | POA: Diagnosis not present

## 2018-04-28 ENCOUNTER — Other Ambulatory Visit: Payer: Self-pay | Admitting: Internal Medicine

## 2018-04-30 ENCOUNTER — Ambulatory Visit: Payer: Medicare Other | Admitting: Internal Medicine

## 2018-04-30 DIAGNOSIS — C760 Malignant neoplasm of head, face and neck: Secondary | ICD-10-CM | POA: Diagnosis not present

## 2018-04-30 DIAGNOSIS — C801 Malignant (primary) neoplasm, unspecified: Secondary | ICD-10-CM | POA: Diagnosis not present

## 2018-04-30 DIAGNOSIS — R7309 Other abnormal glucose: Secondary | ICD-10-CM | POA: Diagnosis not present

## 2018-04-30 DIAGNOSIS — C770 Secondary and unspecified malignant neoplasm of lymph nodes of head, face and neck: Secondary | ICD-10-CM | POA: Diagnosis not present

## 2018-05-04 ENCOUNTER — Ambulatory Visit (INDEPENDENT_AMBULATORY_CARE_PROVIDER_SITE_OTHER): Payer: Medicare HMO | Admitting: *Deleted

## 2018-05-04 DIAGNOSIS — I5042 Chronic combined systolic (congestive) and diastolic (congestive) heart failure: Secondary | ICD-10-CM | POA: Diagnosis not present

## 2018-05-04 DIAGNOSIS — C801 Malignant (primary) neoplasm, unspecified: Secondary | ICD-10-CM | POA: Diagnosis not present

## 2018-05-04 DIAGNOSIS — I428 Other cardiomyopathies: Secondary | ICD-10-CM

## 2018-05-04 DIAGNOSIS — C770 Secondary and unspecified malignant neoplasm of lymph nodes of head, face and neck: Secondary | ICD-10-CM | POA: Diagnosis not present

## 2018-05-04 DIAGNOSIS — I429 Cardiomyopathy, unspecified: Secondary | ICD-10-CM

## 2018-05-04 NOTE — Progress Notes (Signed)
Remote ICD transmission.   

## 2018-05-06 LAB — CUP PACEART REMOTE DEVICE CHECK
Battery Remaining Percentage: 88 %
Date Time Interrogation Session: 20190521080022
HIGH POWER IMPEDANCE MEASURED VALUE: 46 Ohm
HIGH POWER IMPEDANCE MEASURED VALUE: 46 Ohm
Implantable Lead Model: 7121
Implantable Pulse Generator Implant Date: 20180220
Lead Channel Impedance Value: 400 Ohm
Lead Channel Pacing Threshold Pulse Width: 0.8 ms
Lead Channel Sensing Intrinsic Amplitude: 5.6 mV
Lead Channel Setting Pacing Pulse Width: 0.8 ms
MDC IDC LEAD IMPLANT DT: 20090618
MDC IDC LEAD LOCATION: 753860
MDC IDC MSMT BATTERY REMAINING LONGEVITY: 89 mo
MDC IDC MSMT BATTERY VOLTAGE: 3.05 V
MDC IDC MSMT LEADCHNL RV PACING THRESHOLD AMPLITUDE: 1 V
MDC IDC PG SERIAL: 7406967
MDC IDC SET LEADCHNL RV PACING AMPLITUDE: 2.5 V
MDC IDC SET LEADCHNL RV SENSING SENSITIVITY: 0.5 mV
MDC IDC STAT BRADY RV PERCENT PACED: 1 %

## 2018-05-27 DIAGNOSIS — R69 Illness, unspecified: Secondary | ICD-10-CM | POA: Diagnosis not present

## 2018-06-03 ENCOUNTER — Ambulatory Visit (INDEPENDENT_AMBULATORY_CARE_PROVIDER_SITE_OTHER): Payer: Medicare HMO | Admitting: Internal Medicine

## 2018-06-03 ENCOUNTER — Encounter: Payer: Self-pay | Admitting: Internal Medicine

## 2018-06-03 VITALS — BP 124/70 | HR 48 | Temp 98.0°F | Resp 16 | Ht 70.0 in | Wt 154.4 lb

## 2018-06-03 DIAGNOSIS — E785 Hyperlipidemia, unspecified: Secondary | ICD-10-CM | POA: Diagnosis not present

## 2018-06-03 DIAGNOSIS — E871 Hypo-osmolality and hyponatremia: Secondary | ICD-10-CM

## 2018-06-03 DIAGNOSIS — R739 Hyperglycemia, unspecified: Secondary | ICD-10-CM | POA: Diagnosis not present

## 2018-06-03 DIAGNOSIS — Z0000 Encounter for general adult medical examination without abnormal findings: Secondary | ICD-10-CM | POA: Diagnosis not present

## 2018-06-03 LAB — LIPID PANEL
CHOLESTEROL: 124 mg/dL (ref 0–200)
HDL: 64.4 mg/dL (ref >39.00)
LDL CALC: 50 mg/dL (ref 0–99)
NonHDL: 59.82
Total CHOL/HDL Ratio: 2
Triglycerides: 50 mg/dL (ref 0.0–149.0)
VLDL: 10 mg/dL (ref 0.0–40.0)

## 2018-06-03 LAB — COMPREHENSIVE METABOLIC PANEL
ALBUMIN: 4.1 g/dL (ref 3.5–5.2)
ALT: 19 U/L (ref 0–53)
AST: 20 U/L (ref 0–37)
Alkaline Phosphatase: 53 U/L (ref 39–117)
BILIRUBIN TOTAL: 1.4 mg/dL — AB (ref 0.2–1.2)
BUN: 8 mg/dL (ref 6–23)
CHLORIDE: 95 meq/L — AB (ref 96–112)
CO2: 30 mEq/L (ref 19–32)
CREATININE: 0.69 mg/dL (ref 0.40–1.50)
Calcium: 10 mg/dL (ref 8.4–10.5)
GFR: 121.23 mL/min (ref >60.00)
Glucose, Bld: 113 mg/dL — ABNORMAL HIGH (ref 70–99)
Potassium: 4.1 mEq/L (ref 3.5–5.1)
SODIUM: 131 meq/L — AB (ref 135–145)
Total Protein: 6.3 g/dL (ref 6.0–8.3)

## 2018-06-03 LAB — HEMOGLOBIN A1C: Hgb A1c MFr Bld: 5.7 % (ref 4.6–6.5)

## 2018-06-03 LAB — CBC WITH DIFFERENTIAL/PLATELET
BASOS ABS: 0 10*3/uL (ref 0.0–0.1)
BASOS PCT: 1 % (ref 0.0–3.0)
EOS ABS: 0.1 10*3/uL (ref 0.0–0.7)
Eosinophils Relative: 1.2 % (ref 0.0–5.0)
HEMATOCRIT: 41.7 % (ref 39.0–52.0)
HEMOGLOBIN: 14 g/dL (ref 13.0–17.0)
LYMPHS PCT: 8.5 % — AB (ref 12.0–46.0)
Lymphs Abs: 0.4 10*3/uL — ABNORMAL LOW (ref 0.7–4.0)
MCHC: 33.6 g/dL (ref 30.0–36.0)
MCV: 91.3 fl (ref 78.0–100.0)
Monocytes Absolute: 0.6 10*3/uL (ref 0.1–1.0)
Monocytes Relative: 15.2 % — ABNORMAL HIGH (ref 3.0–12.0)
Neutro Abs: 3.1 10*3/uL (ref 1.4–7.7)
Neutrophils Relative %: 74.1 % (ref 43.0–77.0)
Platelets: 156 10*3/uL (ref 150.0–400.0)
RBC: 4.57 Mil/uL (ref 4.22–5.81)
RDW: 13.3 % (ref 11.5–15.5)
WBC: 4.2 10*3/uL (ref 4.0–10.5)

## 2018-06-03 LAB — T3, FREE: T3 FREE: 2.9 pg/mL (ref 2.3–4.2)

## 2018-06-03 LAB — TSH: TSH: 1.64 u[IU]/mL (ref 0.35–4.50)

## 2018-06-03 LAB — T4, FREE: FREE T4: 1.28 ng/dL (ref 0.60–1.60)

## 2018-06-03 NOTE — Patient Instructions (Signed)
GO TO THE LAB : Get the blood work     GO TO THE FRONT DESK Schedule your next appointment for a  Physical exam in 1 year 

## 2018-06-03 NOTE — Assessment & Plan Note (Addendum)
--  Td 04/2017; pneumonia shot 2009 and 2016, Prevnar 2015; zostavax 2014; got shingrex #1 --CCS: 01-2008: Cscope, tics, no polyps; due for a cscope, will re assess next year, just recovering from XRT -Chemo --Prostate cancer screening: DRE- PSA wnl 2016, see above, re assess next year --Diet and exercise discussed  --labs, was rec to check TFTs d/t neck XRT : CMP, CBC, FLP, TSH, free T3, free T4, A1c

## 2018-06-03 NOTE — Progress Notes (Signed)
Pre visit review using our clinic review tool, if applicable. No additional management support is needed unless otherwise documented below in the visit note. 

## 2018-06-03 NOTE — Progress Notes (Signed)
Subjective:    Patient ID: Matthew Joyce, male    DOB: [birth year], 67 y.o.   MRN: [id number]  DOS:  06/03/2018 Type of visit - description : cpx Interval history: Since the last office visit, was diagnosed with cancer, status post treatment, still having side effects from treatment  including dry mouth, weight loss, poor taste. He however remains optimistic, no anxiety or depression.  Review of Systems   Other than above, a 14 point review of systems is negative    Past Medical History:  Diagnosis Date  . AICD (automatic cardioverter/defibrillator) present 6/09   St. Jude  . Bradycardia    Use of beta blocker limited  . CAD (coronary artery disease)    a- S/p cabg in march 2006 by Dr.Owen;  b. myoview 5/12: large IL scar from apex to base, no ischemia, EF 37%  . DM (diabetes mellitus) ([place])   . ED (erectile dysfunction)   . Gout   . Hyperlipidemia   . Ischemic cardiomyopathy    a-Echocardiogram, Nov 2006, showed ejection fraction of 30-40% with mild to moderated mitral  regurgitation and mild aortic regurgitation b- Cardiac MRI, May 2007, EF of 44% with 50% scar involving  the inferolateral walls. No comment of mitral regurgitation. c- last echo  11/10: EF 30-35%  mod MR d- Nega T-Wave alternans testing in May of 2007 e- no ACE due to low BP;  f. CPX 3/11: normal  . 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 Systolic CHF, chronic ([place])     Past Surgical History:  Procedure Laterality Date  . CARDIAC DEFIBRILLATOR PLACEMENT  06/01/08   ICD  . CORONARY ARTERY BYPASS GRAFT  02-2005  . ICD GENERATOR CHANGEOUT N/A 02/03/2017   Procedure: ICD Generator Changeout;  Surgeon: [name], MD;  Location: [facility];  Service: Cardiovascular;  Laterality: N/A;  . ORIF [place]   left   . SHOULDER ARTHROSCOPY  02/2010   rt   . TONSILLECTOMY     as a child    Social History   Socioeconomic History  . Marital status: Married    Spouse name: Not on file  . Number of children: 2  . Years of education: Not on file  . Highest education level: Not on file  Occupational History  . 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 Financial resource strain: Not on file  . Food insecurity:    Worry: Not on file    Inability: Not on file  . Transportation needs:    Medical: Not on file    Non-medical: Not on file  Tobacco Use  . Smoking status: Former Smoker    Packs/day: 0.10    Types: Cigarettes    Last attempt to quit: 12/16/1999    Years since quitting: 18.4  . Smokeless tobacco: Never Used  Substance and Sexual Activity  . Alcohol use: Yes    Comment: socially  . Drug use: No  . Sexual activity: Not on file  Lifestyle  . Physical activity:    Days per week: Not on file    Minutes per session: Not on file  . Stress: Not on file  Relationships  . Social connections:    Talks on phone: Not on file    Gets together: Not on file    Attends religious service: Not on  file    Active member of club or organization: Not on file    Attends meetings of clubs or organizations: Not on file    Relationship status: Not on file  . Intimate partner violence:    Fear of current or ex partner: Not on file    Emotionally abused: Not on file    Physically abused: Not on file    Forced sexual activity: Not on file  Other Topics Concern  . Not on file  Social History Narrative   Household- pt and wife   2 adult children in [place]     Family History  Problem Relation Age of Onset  . Heart attack Father 35  . Cardiomyopathy Brother   . Colon cancer Neg Hx   . Prostate cancer Neg Hx   . Diabetes Neg Hx      Allergies as of 06/03/2018      Reactions   Atorvastatin    REACTION: myalgia   Sulfonamide Derivatives    Rash and hot flashes   Tape Other (See Comments)   Rips skin- use PAPER tape   Sulfamethoxazole Rash   Pt  turns red      Medication List        Accurate as of 06/03/18 11:59 PM. Always use your most recent med list.          aspirin 81 MG tablet Take 81 mg by mouth daily.   candesartan 4 MG tablet Commonly known as:  ATACAND Take 1 tablet (4 mg total) by mouth daily.   carboxymethylcellulose 0.5 % Soln Commonly known as:  REFRESH PLUS Place 1 drop into both eyes daily as needed (dry eyes).   cetirizine 10 MG tablet Commonly known as:  ZYRTEC Take 10 mg by mouth daily.   Colchicine 0.6 MG Caps Commonly known as:  MITIGARE Take 0.6 mg by mouth 2 (two) times daily as needed (gout flare).   Fish Oil 1000 MG Caps Take 3 capsules by mouth daily.   furosemide 40 MG tablet Commonly known as:  LASIX Take 1 tablet (40 mg total) by mouth daily as needed. For swelling/weight gain 3 lbs or more in 24 hrs, or 5 lbs or more in 1 week.   metoprolol succinate 25 MG 24 hr tablet Commonly known as:  TOPROL-XL Take 0.5 tablets (12.5 mg total) by mouth 2 (two) times daily. NEEDS FOLLOW UP   nitroGLYCERIN 0.4 MG SL tablet Commonly known as:  NITROSTAT Place 0.4 mg under the tongue every 5 (five) minutes as needed for chest pain (MAX 3 TABLETS).   rosuvastatin 40 MG tablet Commonly known as:  CRESTOR Take 1 tablet (40 mg total) by mouth at bedtime.          Objective:   Physical Exam BP 124/70 (BP Location: Left Arm, Patient Position: Sitting, Cuff Size: Small)   Pulse (!) 48   Temp 98 F (36.7 C) (Oral)   Resp 16   Ht 5\' 10"  (1.778 m)   Wt 154 lb 6 oz (70 kg)   SpO2 94%   BMI 22.15 kg/m  General: Well developed, NAD, see BMI.  Neck: No  thyromegaly, some hyperpigmentation from XRT noted.  No mass HEENT:  Normocephalic . Face symmetric, atraumatic Lungs:  CTA B Normal respiratory effort, no intercostal retractions, no accessory muscle use. Heart: RRR,  no murmur.  No pretibial edema bilaterally  Abdomen:  Not distended, soft, non-tender. No rebound or rigidity.     Skin: Exposed areas  without rash. Not pale. Not jaundice Neurologic:  alert & oriented X3.  Speech normal, gait appropriate for age and unassisted Strength symmetric and appropriate for age.  Psych: Cognition and judgment appear intact.  Cooperative with normal attention span and concentration.  Behavior appropriate. No anxious or depressed appearing.     Assessment & Plan:   Assessment Prediabetes Hyperlipidemia Gout E.D. 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 S/p chemo x 6, XRT x 35; treatment ended 01/2018   PLAN: Squamosal carcinoma, neck: dx 12/19, pt reports chemo x 6, XRT x 35; treatment ended 01/2018.  Was told he is doing well, still having side effects from treatment.  Fortunately he remains optimistic. Hyperlipidemia: On Crestor, wonders if it is okay to stop the fish oil and that is okay. Gout: Had a couple of episodes during chemotherapy, asymptomatic now CAD, cardiomyopathy, RBBB, AICD: Seen by cardiology 01-2018, no changes recommended, pt is asx.  Has Lasix to take as needed, has never use it. RTC 1 year.

## 2018-06-06 NOTE — Assessment & Plan Note (Signed)
Squamosal carcinoma, neck: dx 12/19, pt reports chemo x 6, XRT x 35; treatment ended 01/2018.  Was told he is doing well, still having side effects from treatment.  Fortunately he remains optimistic. Hyperlipidemia: On Crestor, wonders if it is okay to stop the fish oil and that is okay. Gout: Had a couple of episodes during chemotherapy, asymptomatic now CAD, cardiomyopathy, RBBB, AICD: Seen by cardiology 01-2018, no changes recommended, pt is asx.  Has Lasix to take as needed, has never use it. RTC 1 year.

## 2018-06-07 NOTE — Addendum Note (Signed)
Addended byDamita Dunnings D on: 06/07/2018 08:01 AM   Modules accepted: Orders

## 2018-06-28 DIAGNOSIS — C801 Malignant (primary) neoplasm, unspecified: Secondary | ICD-10-CM | POA: Diagnosis not present

## 2018-06-28 DIAGNOSIS — C770 Secondary and unspecified malignant neoplasm of lymph nodes of head, face and neck: Secondary | ICD-10-CM | POA: Diagnosis not present

## 2018-06-29 DIAGNOSIS — R69 Illness, unspecified: Secondary | ICD-10-CM | POA: Diagnosis not present

## 2018-07-01 DIAGNOSIS — H52223 Regular astigmatism, bilateral: Secondary | ICD-10-CM | POA: Diagnosis not present

## 2018-07-12 ENCOUNTER — Other Ambulatory Visit (INDEPENDENT_AMBULATORY_CARE_PROVIDER_SITE_OTHER): Payer: Medicare HMO

## 2018-07-12 DIAGNOSIS — E871 Hypo-osmolality and hyponatremia: Secondary | ICD-10-CM | POA: Diagnosis not present

## 2018-07-12 LAB — BASIC METABOLIC PANEL
BUN: 10 mg/dL (ref 6–23)
CO2: 30 mEq/L (ref 19–32)
Calcium: 9.2 mg/dL (ref 8.4–10.5)
Chloride: 98 mEq/L (ref 96–112)
Creatinine, Ser: 0.83 mg/dL (ref 0.40–1.50)
GFR: 97.93 mL/min (ref >60.00)
GLUCOSE: 168 mg/dL — AB (ref 70–99)
POTASSIUM: 4 meq/L (ref 3.5–5.1)
Sodium: 135 mEq/L (ref 135–145)

## 2018-07-19 DIAGNOSIS — C770 Secondary and unspecified malignant neoplasm of lymph nodes of head, face and neck: Secondary | ICD-10-CM | POA: Diagnosis not present

## 2018-07-19 DIAGNOSIS — C801 Malignant (primary) neoplasm, unspecified: Secondary | ICD-10-CM | POA: Diagnosis not present

## 2018-07-22 ENCOUNTER — Other Ambulatory Visit: Payer: Self-pay | Admitting: Internal Medicine

## 2018-08-02 DIAGNOSIS — C770 Secondary and unspecified malignant neoplasm of lymph nodes of head, face and neck: Secondary | ICD-10-CM | POA: Diagnosis not present

## 2018-08-02 DIAGNOSIS — C801 Malignant (primary) neoplasm, unspecified: Secondary | ICD-10-CM | POA: Diagnosis not present

## 2018-08-03 ENCOUNTER — Telehealth: Payer: Self-pay | Admitting: Cardiology

## 2018-08-03 ENCOUNTER — Ambulatory Visit (INDEPENDENT_AMBULATORY_CARE_PROVIDER_SITE_OTHER): Payer: Medicare HMO | Admitting: *Deleted

## 2018-08-03 DIAGNOSIS — I429 Cardiomyopathy, unspecified: Secondary | ICD-10-CM

## 2018-08-03 DIAGNOSIS — I428 Other cardiomyopathies: Secondary | ICD-10-CM | POA: Diagnosis not present

## 2018-08-03 DIAGNOSIS — I5042 Chronic combined systolic (congestive) and diastolic (congestive) heart failure: Secondary | ICD-10-CM

## 2018-08-03 NOTE — Telephone Encounter (Signed)
Spoke with pt and reminded pt of remote transmission that is due today. Pt verbalized understanding.   

## 2018-08-04 NOTE — Progress Notes (Signed)
Remote ICD transmission.   

## 2018-09-02 DIAGNOSIS — C770 Secondary and unspecified malignant neoplasm of lymph nodes of head, face and neck: Secondary | ICD-10-CM | POA: Diagnosis not present

## 2018-09-02 DIAGNOSIS — C801 Malignant (primary) neoplasm, unspecified: Secondary | ICD-10-CM | POA: Diagnosis not present

## 2018-09-06 LAB — CUP PACEART REMOTE DEVICE CHECK
Battery Remaining Longevity: 89 mo
Battery Remaining Percentage: 87 %
Date Time Interrogation Session: 20190820201825
HIGH POWER IMPEDANCE MEASURED VALUE: 46 Ohm
HIGH POWER IMPEDANCE MEASURED VALUE: 46 Ohm
Implantable Lead Model: 7121
Lead Channel Impedance Value: 430 Ohm
Lead Channel Setting Pacing Amplitude: 2.5 V
Lead Channel Setting Pacing Pulse Width: 0.8 ms
MDC IDC LEAD IMPLANT DT: 20090618
MDC IDC LEAD LOCATION: 753860
MDC IDC MSMT BATTERY VOLTAGE: 3.02 V
MDC IDC MSMT LEADCHNL RV PACING THRESHOLD AMPLITUDE: 1 V
MDC IDC MSMT LEADCHNL RV PACING THRESHOLD PULSEWIDTH: 0.8 ms
MDC IDC MSMT LEADCHNL RV SENSING INTR AMPL: 4.9 mV
MDC IDC PG IMPLANT DT: 20180220
MDC IDC PG SERIAL: 7406967
MDC IDC SET LEADCHNL RV SENSING SENSITIVITY: 0.5 mV
MDC IDC STAT BRADY RV PERCENT PACED: 1 %

## 2018-09-07 DIAGNOSIS — R69 Illness, unspecified: Secondary | ICD-10-CM | POA: Diagnosis not present

## 2018-09-17 DIAGNOSIS — C770 Secondary and unspecified malignant neoplasm of lymph nodes of head, face and neck: Secondary | ICD-10-CM | POA: Diagnosis not present

## 2018-10-20 ENCOUNTER — Other Ambulatory Visit (HOSPITAL_COMMUNITY): Payer: Self-pay | Admitting: *Deleted

## 2018-10-20 MED ORDER — NITROGLYCERIN 0.4 MG SL SUBL: 0.4000 mg | SUBLINGUAL_TABLET | SUBLINGUAL | 6 refills | Status: DC | PRN

## 2018-10-21 DIAGNOSIS — R69 Illness, unspecified: Secondary | ICD-10-CM | POA: Diagnosis not present

## 2018-10-21 DIAGNOSIS — I252 Old myocardial infarction: Secondary | ICD-10-CM | POA: Diagnosis not present

## 2018-10-21 DIAGNOSIS — C8590 Non-Hodgkin lymphoma, unspecified, unspecified site: Secondary | ICD-10-CM | POA: Diagnosis not present

## 2018-10-21 DIAGNOSIS — N529 Male erectile dysfunction, unspecified: Secondary | ICD-10-CM | POA: Diagnosis not present

## 2018-10-21 DIAGNOSIS — Z8249 Family history of ischemic heart disease and other diseases of the circulatory system: Secondary | ICD-10-CM | POA: Diagnosis not present

## 2018-10-21 DIAGNOSIS — I509 Heart failure, unspecified: Secondary | ICD-10-CM | POA: Diagnosis not present

## 2018-10-21 DIAGNOSIS — Z7982 Long term (current) use of aspirin: Secondary | ICD-10-CM | POA: Diagnosis not present

## 2018-10-21 DIAGNOSIS — I2510 Atherosclerotic heart disease of native coronary artery without angina pectoris: Secondary | ICD-10-CM | POA: Diagnosis not present

## 2018-10-21 DIAGNOSIS — Z87891 Personal history of nicotine dependence: Secondary | ICD-10-CM | POA: Diagnosis not present

## 2018-10-21 DIAGNOSIS — E785 Hyperlipidemia, unspecified: Secondary | ICD-10-CM | POA: Diagnosis not present

## 2018-10-21 DIAGNOSIS — G8929 Other chronic pain: Secondary | ICD-10-CM | POA: Diagnosis not present

## 2018-10-26 DIAGNOSIS — C770 Secondary and unspecified malignant neoplasm of lymph nodes of head, face and neck: Secondary | ICD-10-CM | POA: Diagnosis not present

## 2018-10-26 DIAGNOSIS — C801 Malignant (primary) neoplasm, unspecified: Secondary | ICD-10-CM | POA: Diagnosis not present

## 2018-11-02 ENCOUNTER — Ambulatory Visit (INDEPENDENT_AMBULATORY_CARE_PROVIDER_SITE_OTHER): Payer: Medicare HMO

## 2018-11-02 DIAGNOSIS — I428 Other cardiomyopathies: Secondary | ICD-10-CM | POA: Diagnosis not present

## 2018-11-02 DIAGNOSIS — I429 Cardiomyopathy, unspecified: Secondary | ICD-10-CM

## 2018-11-02 DIAGNOSIS — I5042 Chronic combined systolic (congestive) and diastolic (congestive) heart failure: Secondary | ICD-10-CM

## 2018-11-02 NOTE — Progress Notes (Signed)
Remote ICD transmission.   

## 2018-11-25 ENCOUNTER — Other Ambulatory Visit: Payer: Self-pay | Admitting: Internal Medicine

## 2018-12-01 ENCOUNTER — Other Ambulatory Visit (HOSPITAL_COMMUNITY): Payer: Self-pay | Admitting: *Deleted

## 2018-12-02 DIAGNOSIS — R69 Illness, unspecified: Secondary | ICD-10-CM | POA: Diagnosis not present

## 2018-12-28 DIAGNOSIS — C801 Malignant (primary) neoplasm, unspecified: Secondary | ICD-10-CM | POA: Diagnosis not present

## 2018-12-28 DIAGNOSIS — C770 Secondary and unspecified malignant neoplasm of lymph nodes of head, face and neck: Secondary | ICD-10-CM | POA: Diagnosis not present

## 2018-12-28 LAB — CUP PACEART REMOTE DEVICE CHECK
Battery Remaining Percentage: 85 %
Date Time Interrogation Session: 20191119090017
HIGH POWER IMPEDANCE MEASURED VALUE: 44 Ohm
HighPow Impedance: 45 Ohm
Implantable Lead Model: 7121
Implantable Pulse Generator Implant Date: 20180220
Lead Channel Impedance Value: 450 Ohm
Lead Channel Setting Pacing Amplitude: 2.5 V
Lead Channel Setting Pacing Pulse Width: 0.8 ms
MDC IDC LEAD IMPLANT DT: 20090618
MDC IDC LEAD LOCATION: 753860
MDC IDC MSMT BATTERY REMAINING LONGEVITY: 86 mo
MDC IDC MSMT BATTERY VOLTAGE: 3.01 V
MDC IDC MSMT LEADCHNL RV PACING THRESHOLD AMPLITUDE: 1 V
MDC IDC MSMT LEADCHNL RV PACING THRESHOLD PULSEWIDTH: 0.8 ms
MDC IDC MSMT LEADCHNL RV SENSING INTR AMPL: 6.2 mV
MDC IDC PG SERIAL: 7406967
MDC IDC SET LEADCHNL RV SENSING SENSITIVITY: 0.5 mV
MDC IDC STAT BRADY RV PERCENT PACED: 1 %

## 2019-01-11 DIAGNOSIS — R69 Illness, unspecified: Secondary | ICD-10-CM | POA: Diagnosis not present

## 2019-01-25 ENCOUNTER — Ambulatory Visit: Payer: Medicare HMO | Admitting: Internal Medicine

## 2019-01-25 ENCOUNTER — Encounter: Payer: Self-pay | Admitting: *Deleted

## 2019-01-25 ENCOUNTER — Encounter: Payer: Self-pay | Admitting: Internal Medicine

## 2019-01-25 VITALS — BP 122/66 | HR 51 | Ht 70.0 in | Wt 175.0 lb

## 2019-01-25 DIAGNOSIS — Z951 Presence of aortocoronary bypass graft: Secondary | ICD-10-CM

## 2019-01-25 DIAGNOSIS — C770 Secondary and unspecified malignant neoplasm of lymph nodes of head, face and neck: Secondary | ICD-10-CM | POA: Diagnosis not present

## 2019-01-25 DIAGNOSIS — Z95810 Presence of automatic (implantable) cardiac defibrillator: Secondary | ICD-10-CM

## 2019-01-25 DIAGNOSIS — I5022 Chronic systolic (congestive) heart failure: Secondary | ICD-10-CM | POA: Diagnosis not present

## 2019-01-25 DIAGNOSIS — Z79899 Other long term (current) drug therapy: Secondary | ICD-10-CM | POA: Diagnosis not present

## 2019-01-25 DIAGNOSIS — C801 Malignant (primary) neoplasm, unspecified: Secondary | ICD-10-CM | POA: Diagnosis not present

## 2019-01-25 NOTE — Patient Instructions (Signed)
Medication Instructions:  Your physician recommends that you continue on your current medications as directed. Please refer to the Current Medication list given to you today.  Labwork: None ordered.  Testing/Procedures: None ordered.  Follow-Up: Your physician wants you to follow-up in: one year with [name].   You will receive a reminder letter in the mail two months in advance. If you don't receive a letter, please call our office to schedule the follow-up appointment.  Remote monitoring is used to monitor your ICD from home. This monitoring reduces the number of office visits required to check your device to one time per year. It allows [place] to keep an eye on the functioning of your device to ensure it is working properly. You are scheduled for a device check from home on 02/01/2019. You may send your transmission at any time that day. If you have a wireless device, the transmission will be sent automatically. After your physician reviews your transmission, you will receive a postcard with your next transmission date.  Any Other Special Instructions Will Be Listed Below (If Applicable).  If you need a refill on your cardiac medications before your next appointment, please call your pharmacy.

## 2019-01-25 NOTE — Progress Notes (Signed)
HPI [name] returns today for ongoing evaluation and management of chronic systolic heart failure and his ICD. He has CAD, s/p MI. 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 He has undergone XRT and chemotherapy. He has been told he is now tumor free and has gained back some of the 50 lbs he lost. He is playing golf and he feels well. Allergies  Allergen Reactions  . Atorvastatin      myalgia, Muscle aching  . Sulfonamide Derivatives     Rash and hot flashes  . Tape Other (See Comments)    Rips skin- use PAPER tape  . Sulfamethoxazole Rash    Pt turns red     Current Outpatient Medications  Medication Sig Dispense Refill  . aspirin 81 MG tablet Take 81 mg by mouth daily.      . candesartan (ATACAND) 4 MG tablet Take 1 tablet (4 mg total) by mouth daily. 90 tablet 3  . carboxymethylcellulose (REFRESH PLUS) 0.5 % SOLN Place 1 drop into both eyes daily as needed (dry eyes).     . cetirizine (ZYRTEC) 10 MG tablet Take 10 mg by mouth daily.      . Colchicine (MITIGARE) 0.6 MG CAPS Take 0.6 mg by mouth 2 (two) times daily as needed (gout flare). 60 capsule 0  . metoprolol succinate (TOPROL-XL) 25 MG 24 hr tablet Take 0.5 tablets (12.5 mg total) by mouth 2 (two) times daily. 90 tablet 3  . nitroGLYCERIN (NITROSTAT) 0.4 MG SL tablet Place 1 tablet (0.4 mg total) under the tongue every 5 (five) minutes as needed for chest pain (MAX 3 TABLETS). 25 tablet 6  . Omega-3 Fatty Acids (FISH OIL) 1000 MG CAPS Take 3 capsules by mouth daily.    . rosuvastatin (CRESTOR) 40 MG tablet Take 1 tablet (40 mg total) by mouth at bedtime. 90 tablet 3  . furosemide (LASIX) 40 MG tablet Take 1 tablet (40 mg total) by mouth daily as needed. For swelling/weight gain 3 lbs or more in 24 hrs, or 5 lbs or more in 1 week. (Patient not taking: Reported on 01/25/2019) 90 tablet 3   No current facility-administered medications for this visit.      Past Medical  History:  Diagnosis Date  . AICD (automatic cardioverter/defibrillator) present 6/09   St. Jude  . Bradycardia    Use of beta blocker limited  . CAD (coronary artery disease)    a- S/p cabg in march 2006 by Dr.Owen;  b. myoview 5/12: large IL scar from apex to base, no ischemia, EF 37%  . DM (diabetes mellitus) ([place])   . ED (erectile dysfunction)   . Gout   . Hyperlipidemia   . Ischemic cardiomyopathy    a-Echocardiogram, Nov 2006, showed ejection fraction of 30-40% with mild to moderated mitral  regurgitation and mild aortic regurgitation b- Cardiac MRI, May 2007, EF of 44% with 50% scar involving  the inferolateral walls. No comment of mitral regurgitation. c- last echo  11/10: EF 30-35%  mod MR d- Nega T-Wave alternans testing in May of 2007 e- no ACE due to low BP;  f. CPX 3/11: normal  . 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 Systolic CHF, chronic (HCC)     ROS:   All systems reviewed and negative except as noted in the HPI.   Past Surgical History:  Procedure Laterality Date  .  CARDIAC DEFIBRILLATOR PLACEMENT  06/01/08   ICD  . CORONARY ARTERY BYPASS GRAFT  02-2005  . ICD GENERATOR CHANGEOUT N/A 02/03/2017   Procedure: ICD Generator Changeout;  Surgeon: [name], MD;  Location: [facility];  Service: Cardiovascular;  Laterality: N/A;  . ORIF [place]   left   . SHOULDER ARTHROSCOPY  02/2010   rt   . TONSILLECTOMY     as a child     Family History  Problem Relation Age of Onset  . Heart attack Father 47  . Cardiomyopathy Brother   . Colon cancer Neg Hx   . Prostate cancer Neg Hx   . Diabetes Neg Hx      Social History   Socioeconomic History  . Marital status: Married    Spouse name: Not on file  . Number of children: 2  . Years of education: Not on file  . Highest education level: Not on file  Occupational History  . 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 Financial resource strain: Not on file  . Food insecurity:    Worry: Not on file    Inability: Not on file  . Transportation needs:    Medical: Not on file    Non-medical: Not on file  Tobacco Use  . Smoking status: Former Smoker    Packs/day: 0.10    Types: Cigarettes    Last attempt to quit: 12/16/1999    Years since quitting: 19.1  . Smokeless tobacco: Never Used  Substance and Sexual Activity  . Alcohol use: Yes    Comment: socially  . Drug use: No  . Sexual activity: Not on file  Lifestyle  . Physical activity:    Days per week: Not on file    Minutes per session: Not on file  . Stress: Not on file  Relationships  . Social connections:    Talks on phone: Not on file    Gets together: Not on file    Attends religious service: Not on file    Active member of club or organization: Not on file    Attends meetings of clubs or organizations: Not on file    Relationship status: Not on file  . Intimate partner violence:    Fear of current or ex partner: Not on file    Emotionally abused: Not on file    Physically abused: Not on file    Forced sexual activity: Not on file  Other Topics Concern  . Not on file  Social History Narrative   Household- pt and wife   2 adult children in [place]     BP 122/66   Pulse (!) 51   Ht 5\' 10"  (1.778 m)   Wt 175 lb (79.4 kg)   BMI 25.11 kg/m   Physical Exam:  Well appearing 68 yo man, NAD HEENT: Unremarkable Neck:  6 cm JVD, no thyromegally Lymphatics:  No adenopathy Back:  No CVA tenderness Lungs:  Clear with no wheezes HEART:  Regular rate rhythm, no murmurs, no rubs, no clicks Abd:  soft, positive bowel sounds, no organomegally, no rebound, no guarding Ext:  2 plus pulses, no edema, no cyanosis, no clubbing Skin:  No rashes no nodules Neuro:  CN II through XII intact, motor grossly intact  EKG - sinus brady with RBBB  DEVICE  Normal device function.  See  PaceArt for details.  Assess/Plan: 1. Chronic systolic heart failure - his symptoms are class 2. He will continue his current meds. 2. ICD - his St. Jude single chamber device is working normally.  3. CAD - he is s/p CABG and denies anginal symptoms.   [name].D.

## 2019-02-01 ENCOUNTER — Ambulatory Visit (INDEPENDENT_AMBULATORY_CARE_PROVIDER_SITE_OTHER): Payer: Medicare HMO

## 2019-02-01 DIAGNOSIS — I428 Other cardiomyopathies: Secondary | ICD-10-CM | POA: Diagnosis not present

## 2019-02-01 DIAGNOSIS — I429 Cardiomyopathy, unspecified: Secondary | ICD-10-CM

## 2019-02-01 DIAGNOSIS — I5022 Chronic systolic (congestive) heart failure: Secondary | ICD-10-CM

## 2019-02-01 LAB — CUP PACEART INCLINIC DEVICE CHECK
Date Time Interrogation Session: 20200211185950
HighPow Impedance: 45.2763
Implantable Lead Model: 7121
Implantable Pulse Generator Implant Date: 20180220
Lead Channel Impedance Value: 425 Ohm
Lead Channel Pacing Threshold Pulse Width: 0.8 ms
Lead Channel Pacing Threshold Pulse Width: 0.8 ms
Lead Channel Sensing Intrinsic Amplitude: 6.4 mV
Lead Channel Setting Pacing Amplitude: 2.5 V
MDC IDC LEAD IMPLANT DT: 20090618
MDC IDC LEAD LOCATION: 753860
MDC IDC MSMT BATTERY REMAINING LONGEVITY: 88 mo
MDC IDC MSMT LEADCHNL RV PACING THRESHOLD AMPLITUDE: 1 V
MDC IDC MSMT LEADCHNL RV PACING THRESHOLD AMPLITUDE: 1 V
MDC IDC SET LEADCHNL RV PACING PULSEWIDTH: 0.8 ms
MDC IDC SET LEADCHNL RV SENSING SENSITIVITY: 0.5 mV
MDC IDC STAT BRADY RV PERCENT PACED: 0.33 %
Pulse Gen Serial Number: 7406967

## 2019-02-01 LAB — CUP PACEART REMOTE DEVICE CHECK
Battery Remaining Longevity: 86 mo
Battery Voltage: 2.99 V
Brady Statistic RV Percent Paced: 1 %
HighPow Impedance: 48 Ohm
HighPow Impedance: 48 Ohm
Implantable Lead Implant Date: 20090618
Implantable Lead Location: 753860
Implantable Lead Model: 7121
Lead Channel Pacing Threshold Amplitude: 1 V
Lead Channel Sensing Intrinsic Amplitude: 6.5 mV
Lead Channel Setting Pacing Amplitude: 2.5 V
Lead Channel Setting Sensing Sensitivity: 0.5 mV
MDC IDC MSMT BATTERY REMAINING PERCENTAGE: 83 %
MDC IDC MSMT LEADCHNL RV IMPEDANCE VALUE: 440 Ohm
MDC IDC MSMT LEADCHNL RV PACING THRESHOLD PULSEWIDTH: 0.8 ms
MDC IDC PG IMPLANT DT: 20180220
MDC IDC SESS DTM: 20200218090016
MDC IDC SET LEADCHNL RV PACING PULSEWIDTH: 0.8 ms
Pulse Gen Serial Number: 7406967

## 2019-02-09 NOTE — Progress Notes (Signed)
Remote ICD transmission.   

## 2019-02-14 ENCOUNTER — Encounter: Payer: Self-pay | Admitting: Cardiology

## 2019-02-23 ENCOUNTER — Encounter: Payer: Self-pay | Admitting: Internal Medicine

## 2019-04-01 IMAGING — CT CT NECK W/ CM
4 of 5 series · 14 of 33 positions shown, 16 images · IV contrast (iopamidol)
Comparison: Neck ultrasound 11/02/2017

CLINICAL DATA: Right neck mass.

EXAM:
CT NECK WITH CONTRAST
TECHNIQUE: Multidetector CT imaging of the neck was performed using the
standard protocol following the bolus administration of intravenous
contrast.
CONTRAST:  75mL FUETED-CGG IOPAMIDOL (FUETED-CGG) INJECTION 61%

[Series 3: axial neck · axial · 0.58mm/px · z∈[-321,-133]mm · 4 of 158 slices shown, 5 images]
[im 32/158  soft-tissue]
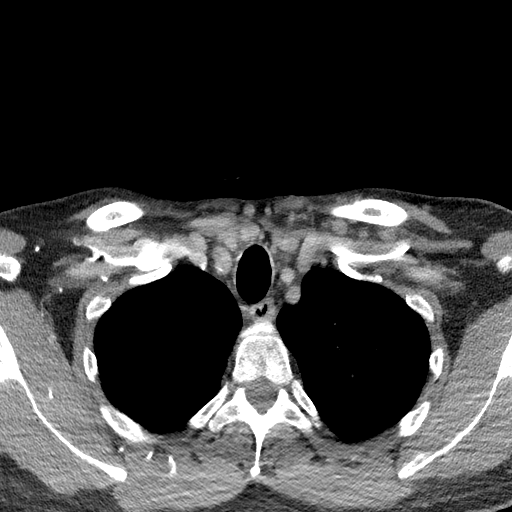
[im 32/158  bone]
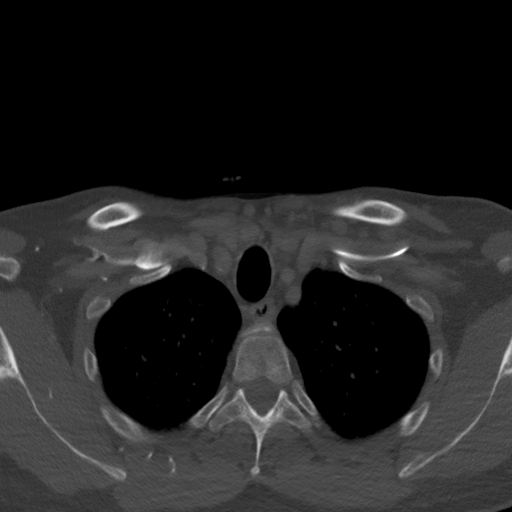
[im 63/158  bone]
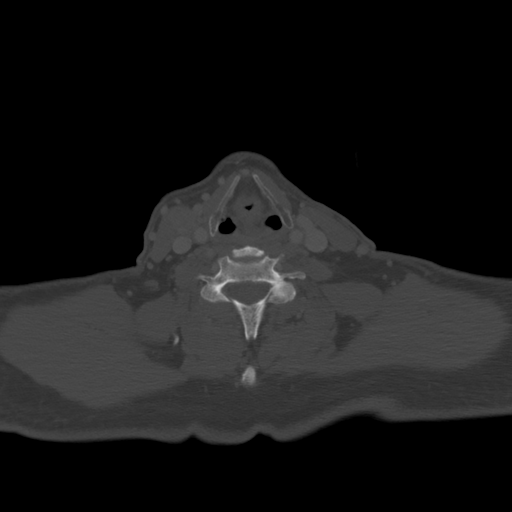
[im 95/158  bone]
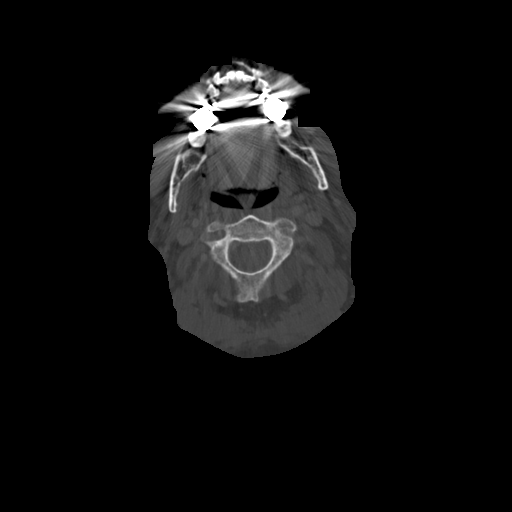
[im 126/158  bone]
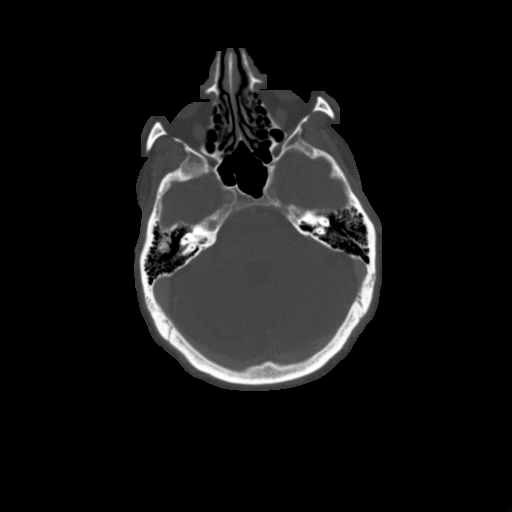

[Series 7: sag neck · sagittal · 0.55mm/px · 5 of 101 slices shown, 6 images]
[im 34/101  bone]
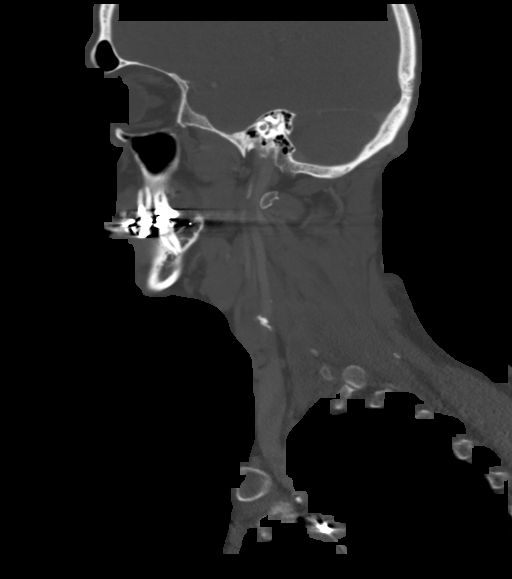
[im 42/101  bone]
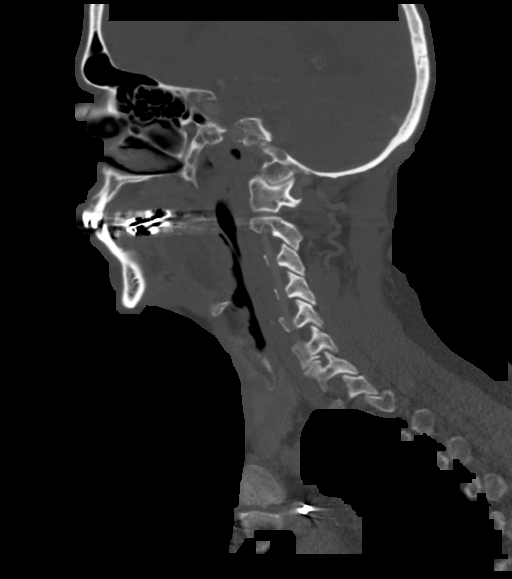
[im 51/101  soft-tissue]
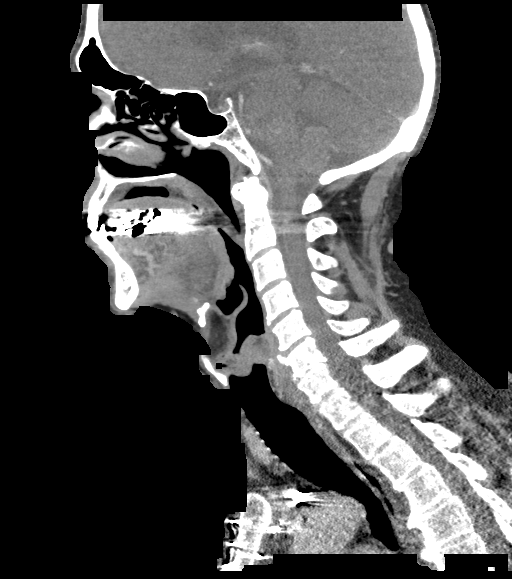
[im 51/101  bone]
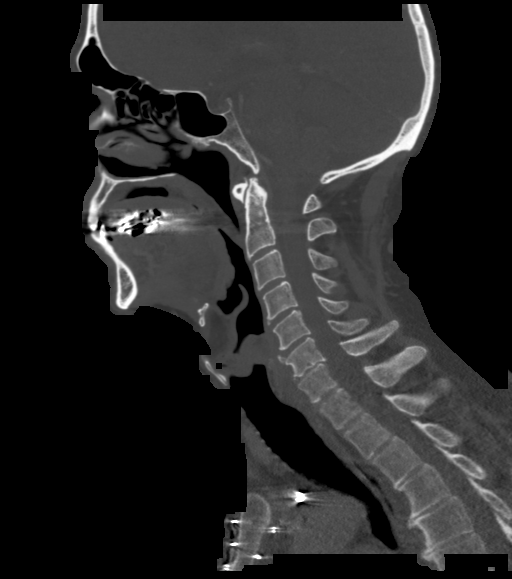
[im 59/101  bone]
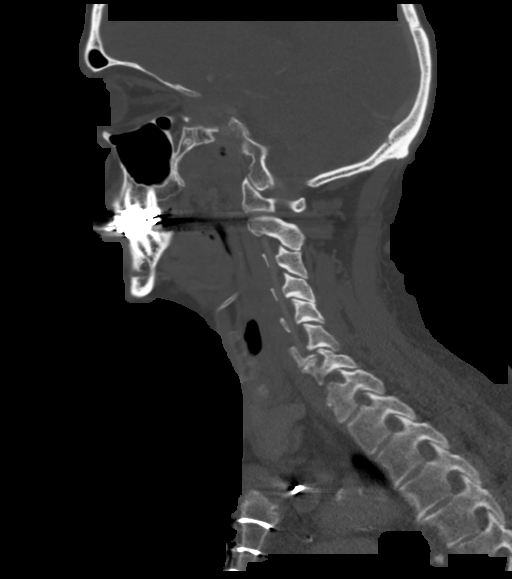
[im 67/101  bone]
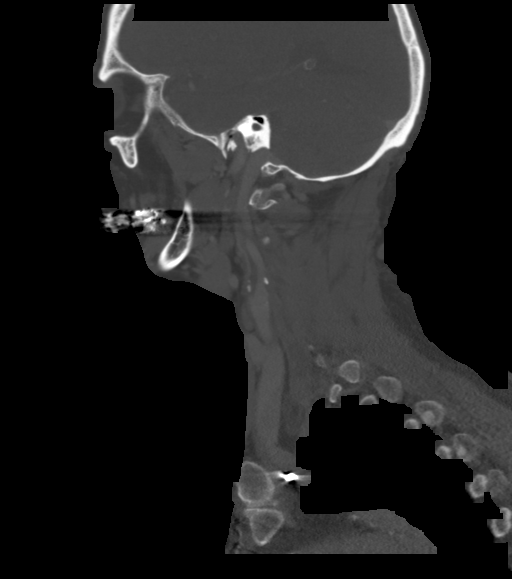

[Series 8: cor neck · coronal · 0.49mm/px · 3 of 119 slices shown]
[im 24/119  bone]
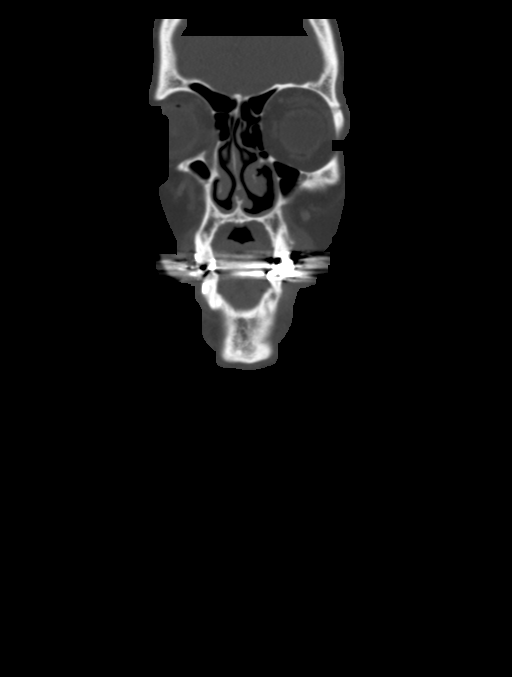
[im 48/119  bone]
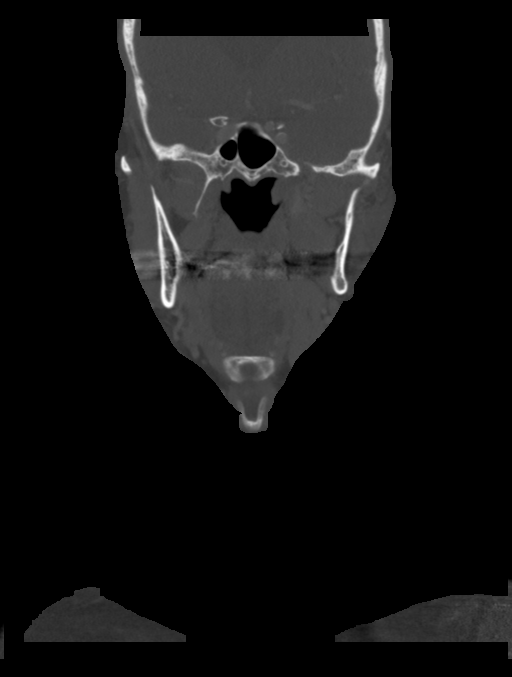
[im 71/119  bone]
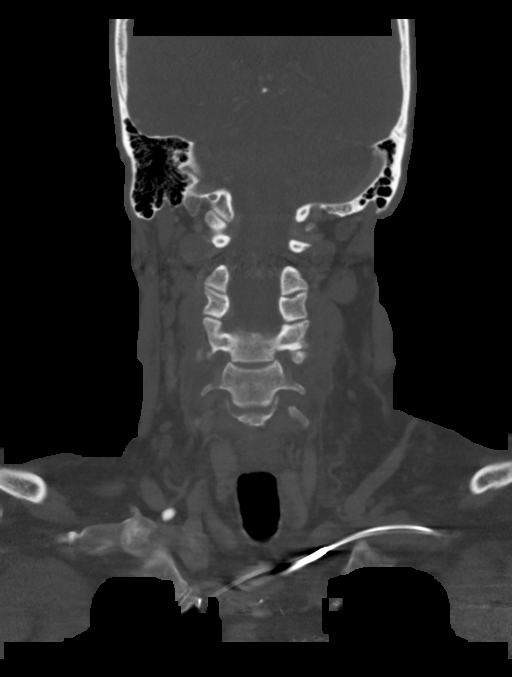

[Series 9: orthogonal ax · axial · 0.39mm/px · z∈[-322,-258]mm · 2 of 159 slices shown]
[im 32/159  bone]
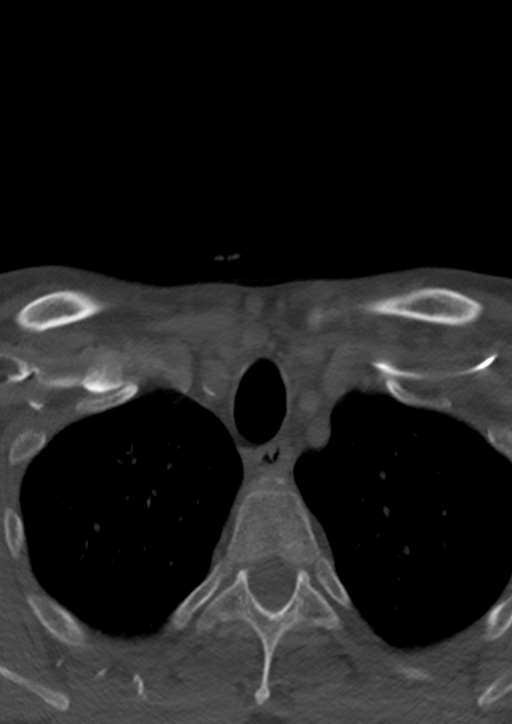
[im 64/159  bone]
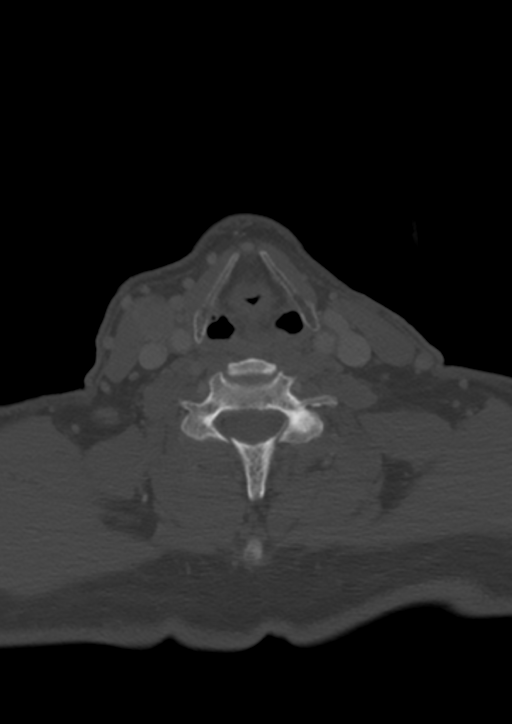

[14 of 33 positions shown; findings below may reference images not displayed]

FINDINGS: Pharynx and larynx: Slight asymmetric soft tissue fullness and
nodularity in the right lateral oropharynx (series 3, image 72)
without a measurable mass. Unremarkable larynx.

Salivary glands: No inflammation, mass, or stone.

Thyroid: 7 mm low-density left thyroid nodule.

Lymph nodes: Enlarged, partially cystic right level II a lymph node
measuring 2.4 x 2.3 cm. Mildly prominent left level III lymph node
measures 7 mm in short axis with a normal elongated morphology.
Small but asymmetrically prominent left supraclavicular lymph nodes
measure 7-8 mm in short axis.

Vascular: Aortic and carotid artery atherosclerosis.

Limited intracranial: Unremarkable.

Visualized orbits: Unremarkable.

Mastoids and visualized paranasal sinuses: Trace bilateral maxillary
sinus mucosal thickening. Clear mastoid air cells.

Skeleton: No suspicious osseous lesion. Lower cervical disc
degeneration, greatest at C6-7 where uncovertebral spurring results
in bilateral neural foraminal stenosis.

Upper chest: Clear lung apices.  Prior CABG.

Other: None.
IMPRESSION: 1. Enlarged, cystic right level II lymph node most concerning for
metastatic adenopathy.
2. Slight soft tissue asymmetry in the right lateral oropharynx.
Recommend direct visualization to assess for a primary lesion.
3. Indeterminate subcentimeter lymph nodes in the left lower
neck/supraclavicular region.

## 2019-04-26 DIAGNOSIS — C801 Malignant (primary) neoplasm, unspecified: Secondary | ICD-10-CM | POA: Diagnosis not present

## 2019-04-26 DIAGNOSIS — C770 Secondary and unspecified malignant neoplasm of lymph nodes of head, face and neck: Secondary | ICD-10-CM | POA: Diagnosis not present

## 2019-05-03 ENCOUNTER — Other Ambulatory Visit: Payer: Self-pay

## 2019-05-03 ENCOUNTER — Ambulatory Visit (INDEPENDENT_AMBULATORY_CARE_PROVIDER_SITE_OTHER): Payer: Medicare HMO | Admitting: *Deleted

## 2019-05-03 DIAGNOSIS — I428 Other cardiomyopathies: Secondary | ICD-10-CM

## 2019-05-03 DIAGNOSIS — I5022 Chronic systolic (congestive) heart failure: Secondary | ICD-10-CM

## 2019-05-03 DIAGNOSIS — I429 Cardiomyopathy, unspecified: Secondary | ICD-10-CM

## 2019-05-03 LAB — CUP PACEART REMOTE DEVICE CHECK
Date Time Interrogation Session: 20200519180019
Implantable Lead Implant Date: 20090618
Implantable Lead Location: 753860
Implantable Lead Model: 7121
Implantable Pulse Generator Implant Date: 20180220
Pulse Gen Serial Number: 7406967

## 2019-05-12 ENCOUNTER — Encounter: Payer: Self-pay | Admitting: Cardiology

## 2019-05-12 NOTE — Progress Notes (Signed)
Remote ICD transmission.   

## 2019-06-09 ENCOUNTER — Other Ambulatory Visit: Payer: Self-pay

## 2019-06-09 ENCOUNTER — Ambulatory Visit (INDEPENDENT_AMBULATORY_CARE_PROVIDER_SITE_OTHER): Payer: Medicare HMO | Admitting: Internal Medicine

## 2019-06-09 ENCOUNTER — Encounter: Payer: Self-pay | Admitting: Internal Medicine

## 2019-06-09 VITALS — BP 108/61 | HR 48 | Temp 98.1°F | Resp 16 | Ht 70.0 in | Wt 171.1 lb

## 2019-06-09 DIAGNOSIS — R739 Hyperglycemia, unspecified: Secondary | ICD-10-CM

## 2019-06-09 DIAGNOSIS — Z0000 Encounter for general adult medical examination without abnormal findings: Secondary | ICD-10-CM | POA: Diagnosis not present

## 2019-06-09 LAB — CBC WITH DIFFERENTIAL/PLATELET
Basophils Absolute: 0.1 10*3/uL (ref 0.0–0.1)
Basophils Relative: 1.3 % (ref 0.0–3.0)
Eosinophils Absolute: 0.3 10*3/uL (ref 0.0–0.7)
Eosinophils Relative: 4.7 % (ref 0.0–5.0)
HCT: 42.1 % (ref 39.0–52.0)
Hemoglobin: 13.9 g/dL (ref 13.0–17.0)
Lymphocytes Relative: 17.3 % (ref 12.0–46.0)
Lymphs Abs: 0.9 10*3/uL (ref 0.7–4.0)
MCHC: 33.1 g/dL (ref 30.0–36.0)
MCV: 91.2 fl (ref 78.0–100.0)
Monocytes Absolute: 0.7 10*3/uL (ref 0.1–1.0)
Monocytes Relative: 13.9 % — ABNORMAL HIGH (ref 3.0–12.0)
Neutro Abs: 3.4 10*3/uL (ref 1.4–7.7)
Neutrophils Relative %: 62.8 % (ref 43.0–77.0)
Platelets: 166 10*3/uL (ref 150.0–400.0)
RBC: 4.61 Mil/uL (ref 4.22–5.81)
RDW: 13.9 % (ref 11.5–15.5)
WBC: 5.4 10*3/uL (ref 4.0–10.5)

## 2019-06-09 LAB — LIPID PANEL
Cholesterol: 115 mg/dL (ref 0–200)
HDL: 43.1 mg/dL (ref >39.00)
LDL Cholesterol: 58 mg/dL (ref 0–99)
NonHDL: 71.62
Total CHOL/HDL Ratio: 3
Triglycerides: 66 mg/dL (ref 0.0–149.0)
VLDL: 13.2 mg/dL (ref 0.0–40.0)

## 2019-06-09 LAB — COMPREHENSIVE METABOLIC PANEL
ALT: 21 U/L (ref 0–53)
AST: 20 U/L (ref 0–37)
Albumin: 4.1 g/dL (ref 3.5–5.2)
Alkaline Phosphatase: 71 U/L (ref 39–117)
BUN: 16 mg/dL (ref 6–23)
CO2: 29 mEq/L (ref 19–32)
Calcium: 9.3 mg/dL (ref 8.4–10.5)
Chloride: 102 mEq/L (ref 96–112)
Creatinine, Ser: 0.87 mg/dL (ref 0.40–1.50)
GFR: 87.03 mL/min (ref >60.00)
Glucose, Bld: 118 mg/dL — ABNORMAL HIGH (ref 70–99)
Potassium: 4.5 mEq/L (ref 3.5–5.1)
Sodium: 137 mEq/L (ref 135–145)
Total Bilirubin: 2.1 mg/dL — ABNORMAL HIGH (ref 0.2–1.2)
Total Protein: 6 g/dL (ref 6.0–8.3)

## 2019-06-09 LAB — TSH: TSH: 5.09 u[IU]/mL — ABNORMAL HIGH (ref 0.35–4.50)

## 2019-06-09 LAB — PSA: PSA: 0.41 ng/mL (ref 0.10–4.00)

## 2019-06-09 LAB — HEMOGLOBIN A1C: Hgb A1c MFr Bld: 6.4 % (ref 4.6–6.5)

## 2019-06-09 NOTE — Assessment & Plan Note (Addendum)
--  Td 04/2017; pneumonia shot 2009 and 2016, Prevnar 2015; zostavax 2014; got shingrex x 2 ; rec early flu shot for 2020 --CCS: 01-2008: Cscope, tics, no polyps; due for a cscope, still not completely ready to proceed, we agreed on an IFOB --Prostate cancer screening: DRE normal, checking a PSA --Diet and exercise discussed  --labs: CMP, FLP, CBC, A1c, TSH, PSA

## 2019-06-09 NOTE — Progress Notes (Signed)
Subjective:    Patient ID: Matthew Joyce, male    DOB: [birth year], 68 y.o.   MRN: [id number]  DOS:  06/09/2019 Type of visit - description: Here for CPX Doing well, notes from cardiology and ENT reviewed Staying active.   Review of Systems Still have some residual side effects from chemotherapy including dry mouth and lack of taste  Other than above, a 14 point review of systems is negative   Past Medical History:  Diagnosis Date  . AICD (automatic cardioverter/defibrillator) present 6/09   St. Jude  . Bradycardia    Use of beta blocker limited  . CAD (coronary artery disease)    a- S/p cabg in march 2006 by Dr.Owen;  b. myoview 5/12: large IL scar from apex to base, no ischemia, EF 37%  . DM (diabetes mellitus) ([place])   . ED (erectile dysfunction)   . Gout   . Hyperlipidemia   . Ischemic cardiomyopathy    a-Echocardiogram, Nov 2006, showed ejection fraction of 30-40% with mild to moderated mitral  regurgitation and mild aortic regurgitation b- Cardiac MRI, May 2007, EF of 44% with 50% scar involving  the inferolateral walls. No comment of mitral regurgitation. c- last echo  11/10: EF 30-35%  mod MR d- Nega T-Wave alternans testing in May of 2007 e- no ACE due to low BP;  f. CPX 3/11: normal  . 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 Systolic CHF, chronic ([place])     Past Surgical History:  Procedure Laterality Date  . CARDIAC DEFIBRILLATOR PLACEMENT  06/01/08   ICD  . CORONARY ARTERY BYPASS GRAFT  02-2005  . ICD GENERATOR CHANGEOUT N/A 02/03/2017   Procedure: ICD Generator Changeout;  Surgeon: [name], MD;  Location: [facility];  Service: Cardiovascular;  Laterality: N/A;  . ORIF [place]   left   . SHOULDER ARTHROSCOPY  02/2010   rt   . TONSILLECTOMY     as a child    Social History   Socioeconomic History  . Marital status: Married    Spouse name: Not on file  . Number of children: 2  . Years of  education: Not on file  . Highest education level: Not on file  Occupational History  . 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 Financial resource strain: Not on file  . Food insecurity    Worry: Not on file    Inability: Not on file  . Transportation needs    Medical: Not on file    Non-medical: Not on file  Tobacco Use  . Smoking status: Former Smoker    Packs/day: 0.10    Types: Cigarettes    Quit date: 12/16/1999    Years since quitting: 19.4  . Smokeless tobacco: Never Used  Substance and Sexual Activity  . Alcohol use: Yes    Comment: socially  . Drug use: No  . Sexual activity: Not on file  Lifestyle  . Physical activity    Days per week: Not on file    Minutes per session: Not on file  . Stress: Not on file  Relationships  . Social Herbalist on phone: Not on file    Gets together: Not on file    Attends religious service: Not on file    Active member of club or organization: Not on  file    Attends meetings of clubs or organizations: Not on file    Relationship status: Not on file  . Intimate partner violence    Fear of current or ex partner: Not on file    Emotionally abused: Not on file    Physically abused: Not on file    Forced sexual activity: Not on file  Other Topics Concern  . Not on file  Social History Narrative   Household- pt and wife   2 adult children in [place]     Family History  Problem Relation Age of Onset  . Heart attack Father 71  . Cardiomyopathy Brother   . Colon cancer Neg Hx   . Prostate cancer Neg Hx   . Diabetes Neg Hx      Allergies as of 06/09/2019      Reactions   Atorvastatin     myalgia, Muscle aching   Sulfonamide Derivatives    Rash and hot flashes   Tape Other (See Comments)   Rips skin- use PAPER tape   Sulfamethoxazole Rash   Pt turns red      Medication List       Accurate as of June 09, 2019  11:59 PM. If you have any questions, ask your nurse or doctor.        aspirin 81 MG tablet Take 81 mg by mouth daily.   candesartan 4 MG tablet Commonly known as: ATACAND Take 1 tablet (4 mg total) by mouth daily.   carboxymethylcellulose 0.5 % Soln Commonly known as: REFRESH PLUS Place 1 drop into both eyes daily as needed (dry eyes).   cetirizine 10 MG tablet Commonly known as: ZYRTEC Take 10 mg by mouth daily.   Colchicine 0.6 MG Caps Commonly known as: Mitigare Take 0.6 mg by mouth 2 (two) times daily as needed (gout flare).   Fish Oil 1000 MG Caps Take 3 capsules by mouth daily.   furosemide 40 MG tablet Commonly known as: Lasix Take 1 tablet (40 mg total) by mouth daily as needed. For swelling/weight gain 3 lbs or more in 24 hrs, or 5 lbs or more in 1 week.   metoprolol succinate 25 MG 24 hr tablet Commonly known as: TOPROL-XL Take 0.5 tablets (12.5 mg total) by mouth 2 (two) times daily.   nitroGLYCERIN 0.4 MG SL tablet Commonly known as: NITROSTAT Place 1 tablet (0.4 mg total) under the tongue every 5 (five) minutes as needed for chest pain (MAX 3 TABLETS).   rosuvastatin 40 MG tablet Commonly known as: CRESTOR Take 1 tablet (40 mg total) by mouth at bedtime.           Objective:   Physical Exam BP 108/61 (BP Location: Left Arm, Patient Position: Sitting, Cuff Size: Small)   Pulse (!) 48   Temp 98.1 F (36.7 C) (Oral)   Resp 16   Ht 5\' 10"  (1.778 m)   Wt 171 lb 2 oz (77.6 kg)   SpO2 100%   BMI 24.55 kg/m  General: Well developed, NAD, BMI noted Neck: No  thyromegaly  HEENT:  Normocephalic . Face symmetric, atraumatic Lungs:  CTA B Normal respiratory effort, no intercostal retractions, no accessory muscle use. Heart: RRR,  no murmur.  No pretibial edema bilaterally  Abdomen:  Not distended, soft, non-tender. No rebound or rigidity.   Skin: Exposed areas without rash. Not pale. Not jaundice DRE: Normal sphincter tone, brown stools,  prostate gland is symmetric, not enlarged, nodular or tender Neurologic:  alert &  oriented X3.  Speech normal, gait appropriate for age and unassisted Strength symmetric and appropriate for age.  Psych: Cognition and judgment appear intact.  Cooperative with normal attention span and concentration.  Behavior appropriate. No anxious or depressed appearing.     Assessment     Assessment Prediabetes Hyperlipidemia Gout E.D. 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 S/p chemo x 6, XRT x 35; treatment ended 01/2018   PLAN: Here for CPX Chronic medical problems seem to be well controlled. Saw ENT 04/26/2019, had a flexible laryngoscopy, felt to be doing well Saw cardiology 01/25/2019, felt to be doing well We are checking general labs including A1c and FLP. RTC 1 year, continue same meds

## 2019-06-09 NOTE — Progress Notes (Signed)
Pre visit review using our clinic review tool, if applicable. No additional management support is needed unless otherwise documented below in the visit note. 

## 2019-06-09 NOTE — Patient Instructions (Signed)
Get the blood work     [place] Schedule your next appointment a physical exam in 1 year      Check the  blood pressure 2 or 3 times a month Be sure your blood pressure is between 110/65 and  135/85. If it is consistently higher or lower, let me know

## 2019-06-10 NOTE — Assessment & Plan Note (Signed)
Here for CPX Chronic medical problems seem to be well controlled. Saw ENT 04/26/2019, had a flexible laryngoscopy, felt to be doing well Saw cardiology 01/25/2019, felt to be doing well We are checking general labs including A1c and FLP. RTC 1 year, continue same meds

## 2019-06-21 ENCOUNTER — Other Ambulatory Visit (INDEPENDENT_AMBULATORY_CARE_PROVIDER_SITE_OTHER): Payer: Medicare HMO

## 2019-06-21 DIAGNOSIS — Z0000 Encounter for general adult medical examination without abnormal findings: Secondary | ICD-10-CM | POA: Diagnosis not present

## 2019-06-21 DIAGNOSIS — R195 Other fecal abnormalities: Secondary | ICD-10-CM

## 2019-06-21 DIAGNOSIS — Z1211 Encounter for screening for malignant neoplasm of colon: Secondary | ICD-10-CM

## 2019-06-21 LAB — FECAL OCCULT BLOOD, IMMUNOCHEMICAL: Fecal Occult Bld: POSITIVE — AB

## 2019-07-05 DIAGNOSIS — R69 Illness, unspecified: Secondary | ICD-10-CM | POA: Diagnosis not present

## 2019-07-10 ENCOUNTER — Other Ambulatory Visit: Payer: Self-pay | Admitting: Internal Medicine

## 2019-07-21 ENCOUNTER — Ambulatory Visit: Payer: Medicare HMO | Admitting: Internal Medicine

## 2019-07-21 ENCOUNTER — Telehealth: Payer: Self-pay | Admitting: Internal Medicine

## 2019-07-21 ENCOUNTER — Encounter: Payer: Self-pay | Admitting: Internal Medicine

## 2019-07-21 VITALS — BP 110/60 | HR 56 | Temp 97.7°F | Ht 67.5 in | Wt 172.0 lb

## 2019-07-21 DIAGNOSIS — R195 Other fecal abnormalities: Secondary | ICD-10-CM

## 2019-07-21 DIAGNOSIS — K117 Disturbances of salivary secretion: Secondary | ICD-10-CM

## 2019-07-21 DIAGNOSIS — R682 Dry mouth, unspecified: Secondary | ICD-10-CM

## 2019-07-21 DIAGNOSIS — Z95810 Presence of automatic (implantable) cardiac defibrillator: Secondary | ICD-10-CM

## 2019-07-21 NOTE — Progress Notes (Signed)
Matthew Joyce 68 y.o. 25-May-1951 [id number]  Assessment & Plan:   Encounter Diagnoses  Name Primary?   Heme + stool Yes   AICD (automatic cardioverter/defibrillator) present    Xerostomia     Colonoscopy is appropriate to investigate the heme positive stool.  Because of his low ejection fraction and presence of an AICD, for safety purposes this will be done at the hospital as we do not perform procedures with sedation on patients such as he in our outpatient ambulatory surgery center.  The risks and benefits as well as alternatives of endoscopic procedure(s) have been discussed and reviewed. All questions answered. The patient agrees to proceed.   Regarding his xerostomia we were talking about that and I mentioned he could consider asking for treatment with getting for treatment with pilocarpine but he should clarify with both his ENT physician and cardiologist, Dr. Haroldine Laws before he would use that.  I appreciate the opportunity to care for this patient. CC: Colon Branch, MD  Subjective:   Chief Complaint: Heme positive stool  HPI The patient is a 68 year old married white man who recently had an immune fecal occult blood test positive as part of colorectal cancer screening.  He denies any active symptoms of GI bleeding bowel habit changes in his GI review of systems is really negative overall.  He reports having some dry mouth problems related to previous treatment of head and neck cancer with radiation therapy.  ACT gum seems to help the most he says.  He has not tried any prescription therapy.  Previous colonoscopy in 2009 showed diverticulosis and internal hemorrhoids.  He is not anemic.   He is able to have good function go up steps walk from room to room there is no significant dyspnea on exertion orthopnea reported.  He is able to golf several times a week.  He has an AICD in place because his ejection fraction is 30 to 35% at most.  Last echocardiogram 2017.  He  is followed by Dr. Haroldine Laws and [name] of cardiology. Allergies  Allergen Reactions   Atorvastatin      myalgia, Muscle aching   Sulfonamide Derivatives     Rash and hot flashes   Tape Other (See Comments)    Rips skin- use PAPER tape   Sulfamethoxazole Rash    Pt turns red   Current Meds  Medication Sig   aspirin 81 MG tablet Take 81 mg by mouth daily.     candesartan (ATACAND) 4 MG tablet Take 1 tablet (4 mg total) by mouth daily.   carboxymethylcellulose (REFRESH PLUS) 0.5 % SOLN Place 1 drop into both eyes daily as needed (dry eyes).    cetirizine (ZYRTEC) 10 MG tablet Take 10 mg by mouth daily.     Colchicine (MITIGARE) 0.6 MG CAPS Take 0.6 mg by mouth 2 (two) times daily as needed (gout flare).   metoprolol succinate (TOPROL-XL) 25 MG 24 hr tablet Take 0.5 tablets (12.5 mg total) by mouth 2 (two) times daily.   Omega-3 Fatty Acids (FISH OIL) 1000 MG CAPS Take 3 capsules by mouth daily.   rosuvastatin (CRESTOR) 40 MG tablet Take 1 tablet (40 mg total) by mouth at bedtime.   Past Medical History:  Diagnosis Date   AICD (automatic cardioverter/defibrillator) present 6/09   St. Jude   Bradycardia    Use of beta blocker limited   CAD (coronary artery disease)    a- S/p cabg in march 2006 by Dr.Owen;  b. [name]  5/12: large IL scar from apex to base, no ischemia, EF 37%   Cancer (HCC)    on right side of neck   DM (diabetes mellitus) ([place])    ED (erectile dysfunction)    Gout    Hyperlipidemia    Ischemic cardiomyopathy    a-Echocardiogram, Nov 2006, showed ejection fraction of 30-40% with mild to moderated mitral  regurgitation and mild aortic regurgitation b- Cardiac MRI, May 2007, EF of 44% with 50% scar involving  the inferolateral walls. No comment of mitral regurgitation. c- last echo  11/10: EF 30-35%  mod MR d- Nega T-Wave alternans testing in May of 2007 e- no ACE due to low BP;  f. CPX 3/11: normal   RBBB (right bundle branch block with left  anterior fascicular block)    Systolic CHF, chronic [facility])    Past Surgical History:  Procedure Laterality Date   CARDIAC DEFIBRILLATOR PLACEMENT  06/01/08   ICD   COLONOSCOPY     CORONARY ARTERY BYPASS GRAFT  02-2005   ICD GENERATOR CHANGEOUT N/A 02/03/2017   Procedure: ICD Generator Changeout;  Surgeon: [name], MD;  Location: [facility];  Service: Cardiovascular;  Laterality: N/A;   ORIF ANKLE FRACTURE  1987   left    SHOULDER ARTHROSCOPY  02/2010   rt    TONSILLECTOMY     as a child   Social History   Social History Narrative   Retired and happy about it    household- pt and wife   2 adult children in [place] also has grandchildren   Former but not current smoker, occasional alcohol no drug use   family history includes Cardiomyopathy in his brother; Heart attack (age of onset: 72) in his father.   Review of Systems As per HPI  Objective:   Physical Exam BP 110/60 (BP Location: Left Arm, Patient Position: Sitting, Cuff Size: Normal)    Pulse (!) 56    Temp 97.7 F (36.5 C)    Ht 5' 7.5" (1.715 m) Comment: height measured without shoes   Wt 172 lb (78 kg)    BMI 26.54 kg/m   NAD Anicteric Lungs CTA Cor sl bradycardic NL S1S2 no rmg abd soft and NT no HSM/mass Skin - tanned Ext trace LE edema R>L A and o x 3   CBC Latest Ref Rng & Units 06/09/2019 06/03/2018 11/02/2017  WBC 4.0 - 10.5 K/uL 5.4 4.2 8.8  Hemoglobin 13.0 - 17.0 g/dL 13.9 14.0 13.6  Hematocrit 39.0 - 52.0 % 42.1 41.7 41.0  Platelets 150.0 - 400.0 K/uL 166.0 156.0 193

## 2019-07-21 NOTE — Patient Instructions (Addendum)
   You can ask ENTdoctor and Dr. Haroldine Laws if pilocarpine is ok to try for dry mouth.  You have been scheduled for a colonoscopy. Please follow written instructions given to you at your visit today.  Please pick up your prep supplies at the pharmacy within the next 1-3 days. If you use inhalers (even only as needed), please bring them with you on the day of your procedure.   Due to recent COVID-19 restrictions implemented by our local and state authorities and in an effort to keep both patients and staff as safe as possible, our hospital system now requires COVID-19 testing prior to any scheduled hospital procedure. Please go to our [facility] location drive thru testing site ([address] [place], [place] [address]) on 08/06/2019, any time between 9:30am - 3 pm (Wednesday hours are 9:30 am-12 pm and Saturday hours are 9 am-12:30 pm). There will be multiple testing areas, the first checkpoint being for pre-procedure/surgery testing. Get into the right (yellow) lane that leads to the PAT testing team. You will not be billed at the time of testing but may receive a bill later depending on your insurance. The approximate cost of the test is $100. You must agree to quarantine from the time of your testing until the procedure date on 08/10/2019 . This should include staying at home with ONLY the people you live with. Avoid take-out, grocery store shopping or leaving the house for any non-emergent reason. Please call our office at [phone] if you have any questions.    I appreciate the opportunity to care for you. Silvano Rusk, MD, [facility]

## 2019-07-21 NOTE — Telephone Encounter (Signed)
Yes I can do trhat one

## 2019-07-21 NOTE — Telephone Encounter (Signed)
Is 07/28/2019 at 12:30pm still good for you if I can switch this Sir?

## 2019-07-21 NOTE — H&P (View-Only) (Signed)
Matthew Joyce 68 y.o. 1951-08-30 [id number]  Assessment & Plan:   Encounter Diagnoses  Name Primary?   Heme + stool Yes   AICD (automatic cardioverter/defibrillator) present    Xerostomia     Colonoscopy is appropriate to investigate the heme positive stool.  Because of his low ejection fraction and presence of an AICD, for safety purposes this will be done at the hospital as we do not perform procedures with sedation on patients such as he in our outpatient ambulatory surgery center.  The risks and benefits as well as alternatives of endoscopic procedure(s) have been discussed and reviewed. All questions answered. The patient agrees to proceed.   Regarding his xerostomia we were talking about that and I mentioned he could consider asking for treatment with getting for treatment with pilocarpine but he should clarify with both his ENT physician and cardiologist, Dr. Haroldine Laws before he would use that.  I appreciate the opportunity to care for this patient. CC: Matthew Branch, MD  Subjective:   Chief Complaint: Heme positive stool  HPI The patient is a 68 year old married white man who recently had an immune fecal occult blood test positive as part of colorectal cancer screening.  He denies any active symptoms of GI bleeding bowel habit changes in his GI review of systems is really negative overall.  He reports having some dry mouth problems related to previous treatment of head and neck cancer with radiation therapy.  ACT gum seems to help the most he says.  He has not tried any prescription therapy.  Previous colonoscopy in 2009 showed diverticulosis and internal hemorrhoids.  He is not anemic.   He is able to have good function go up steps walk from room to room there is no significant dyspnea on exertion orthopnea reported.  He is able to golf several times a week.  He has an AICD in place because his ejection fraction is 30 to 35% at most.  Last echocardiogram 2017.  He  is followed by Dr. Haroldine Laws and [name] of cardiology. Allergies  Allergen Reactions   Atorvastatin      myalgia, Muscle aching   Sulfonamide Derivatives     Rash and hot flashes   Tape Other (See Comments)    Rips skin- use PAPER tape   Sulfamethoxazole Rash    Pt turns red   Current Meds  Medication Sig   aspirin 81 MG tablet Take 81 mg by mouth daily.     candesartan (ATACAND) 4 MG tablet Take 1 tablet (4 mg total) by mouth daily.   carboxymethylcellulose (REFRESH PLUS) 0.5 % SOLN Place 1 drop into both eyes daily as needed (dry eyes).    cetirizine (ZYRTEC) 10 MG tablet Take 10 mg by mouth daily.     Colchicine (MITIGARE) 0.6 MG CAPS Take 0.6 mg by mouth 2 (two) times daily as needed (gout flare).   metoprolol succinate (TOPROL-XL) 25 MG 24 hr tablet Take 0.5 tablets (12.5 mg total) by mouth 2 (two) times daily.   Omega-3 Fatty Acids (FISH OIL) 1000 MG CAPS Take 3 capsules by mouth daily.   rosuvastatin (CRESTOR) 40 MG tablet Take 1 tablet (40 mg total) by mouth at bedtime.   Past Medical History:  Diagnosis Date   AICD (automatic cardioverter/defibrillator) present 6/09   St. Jude   Bradycardia    Use of beta blocker limited   CAD (coronary artery disease)    a- S/p cabg in march 2006 by Dr.Owen;  b. [name]  5/12: large IL scar from apex to base, no ischemia, EF 37%   Cancer (HCC)    on right side of neck   DM (diabetes mellitus) ([place])    ED (erectile dysfunction)    Gout    Hyperlipidemia    Ischemic cardiomyopathy    a-Echocardiogram, Nov 2006, showed ejection fraction of 30-40% with mild to moderated mitral  regurgitation and mild aortic regurgitation b- Cardiac MRI, May 2007, EF of 44% with 50% scar involving  the inferolateral walls. No comment of mitral regurgitation. c- last echo  11/10: EF 30-35%  mod MR d- Nega T-Wave alternans testing in May of 2007 e- no ACE due to low BP;  f. CPX 3/11: normal   RBBB (right bundle Joyce block with left  anterior fascicular block)    Systolic CHF, chronic [facility])    Past Surgical History:  Procedure Laterality Date   CARDIAC DEFIBRILLATOR PLACEMENT  06/01/08   ICD   COLONOSCOPY     CORONARY ARTERY BYPASS GRAFT  02-2005   ICD GENERATOR CHANGEOUT N/A 02/03/2017   Procedure: ICD Generator Changeout;  Surgeon: [name], MD;  Location: [facility];  Service: Cardiovascular;  Laterality: N/A;   ORIF ANKLE FRACTURE  1987   left    SHOULDER ARTHROSCOPY  02/2010   rt    TONSILLECTOMY     as a child   Social History   Social History Narrative   Retired and happy about it    household- pt and wife   2 adult children in [place] also has grandchildren   Former but not current smoker, occasional alcohol no drug use   family history includes Cardiomyopathy in his brother; Heart attack (age of onset: 67) in his father.   Review of Systems As per HPI  Objective:   Physical Exam BP 110/60 (BP Location: Left Arm, Patient Position: Sitting, Cuff Size: Normal)    Pulse (!) 56    Temp 97.7 F (36.5 C)    Ht 5' 7.5" (1.715 m) Comment: height measured without shoes   Wt 172 lb (78 kg)    BMI 26.54 kg/m   NAD Anicteric Lungs CTA Cor sl bradycardic NL S1S2 no rmg abd soft and NT no HSM/mass Skin - tanned Ext trace LE edema R>L A and o x 3   CBC Latest Ref Rng & Units 06/09/2019 06/03/2018 11/02/2017  WBC 4.0 - 10.5 K/uL 5.4 4.2 8.8  Hemoglobin 13.0 - 17.0 g/dL 13.9 14.0 13.6  Hematocrit 39.0 - 52.0 % 42.1 41.7 41.0  Platelets 150.0 - 400.0 K/uL 166.0 156.0 193

## 2019-07-22 NOTE — Telephone Encounter (Signed)
I have changed his colonoscopy to 07/28/2019 at [facility] at 12:30pm and his COVID testing to 07/25/2019. I have spoken to the patient and he is good with the new dates and times. He requested I email this to him which I did after confirming his email.

## 2019-07-25 ENCOUNTER — Other Ambulatory Visit (HOSPITAL_COMMUNITY)
Admission: RE | Admit: 2019-07-25 | Discharge: 2019-07-25 | Disposition: A | Discharge status: Home / Self Care | Payer: Medicare HMO | Source: Ambulatory Visit | Attending: Internal Medicine | Admitting: Internal Medicine

## 2019-07-25 DIAGNOSIS — Z01812 Encounter for preprocedural laboratory examination: Secondary | ICD-10-CM | POA: Insufficient documentation

## 2019-07-25 DIAGNOSIS — Z20828 Contact with and (suspected) exposure to other viral communicable diseases: Secondary | ICD-10-CM | POA: Insufficient documentation

## 2019-07-25 LAB — SARS CORONAVIRUS 2 (TAT 6-24 HRS): SARS Coronavirus 2: NEGATIVE

## 2019-07-27 ENCOUNTER — Encounter (HOSPITAL_COMMUNITY): Payer: Self-pay | Admitting: *Deleted

## 2019-07-27 ENCOUNTER — Other Ambulatory Visit: Payer: Self-pay

## 2019-07-28 ENCOUNTER — Encounter (HOSPITAL_COMMUNITY)
Admission: RE | Disposition: A | Discharge status: Home / Self Care | Payer: Self-pay | Source: Home / Self Care | Attending: Internal Medicine

## 2019-07-28 ENCOUNTER — Encounter (HOSPITAL_COMMUNITY): Payer: Self-pay

## 2019-07-28 ENCOUNTER — Other Ambulatory Visit: Payer: Self-pay

## 2019-07-28 ENCOUNTER — Ambulatory Visit (HOSPITAL_COMMUNITY): Payer: Medicare HMO | Admitting: Physician Assistant

## 2019-07-28 ENCOUNTER — Ambulatory Visit (HOSPITAL_COMMUNITY)
Admission: RE | Admit: 2019-07-28 | Discharge: 2019-07-28 | Disposition: A | Discharge status: Home / Self Care | Payer: Medicare HMO | Attending: Internal Medicine | Admitting: Internal Medicine

## 2019-07-28 DIAGNOSIS — K5730 Diverticulosis of large intestine without perforation or abscess without bleeding: Secondary | ICD-10-CM | POA: Insufficient documentation

## 2019-07-28 DIAGNOSIS — R001 Bradycardia, unspecified: Secondary | ICD-10-CM | POA: Diagnosis not present

## 2019-07-28 DIAGNOSIS — I2510 Atherosclerotic heart disease of native coronary artery without angina pectoris: Secondary | ICD-10-CM | POA: Insufficient documentation

## 2019-07-28 DIAGNOSIS — Z951 Presence of aortocoronary bypass graft: Secondary | ICD-10-CM | POA: Diagnosis not present

## 2019-07-28 DIAGNOSIS — E785 Hyperlipidemia, unspecified: Secondary | ICD-10-CM | POA: Insufficient documentation

## 2019-07-28 DIAGNOSIS — Z95810 Presence of automatic (implantable) cardiac defibrillator: Secondary | ICD-10-CM | POA: Diagnosis not present

## 2019-07-28 DIAGNOSIS — K644 Residual hemorrhoidal skin tags: Secondary | ICD-10-CM | POA: Diagnosis not present

## 2019-07-28 DIAGNOSIS — I5022 Chronic systolic (congestive) heart failure: Secondary | ICD-10-CM | POA: Diagnosis not present

## 2019-07-28 DIAGNOSIS — Z87891 Personal history of nicotine dependence: Secondary | ICD-10-CM | POA: Insufficient documentation

## 2019-07-28 DIAGNOSIS — I255 Ischemic cardiomyopathy: Secondary | ICD-10-CM | POA: Diagnosis not present

## 2019-07-28 DIAGNOSIS — M109 Gout, unspecified: Secondary | ICD-10-CM | POA: Diagnosis not present

## 2019-07-28 DIAGNOSIS — Z888 Allergy status to other drugs, medicaments and biological substances status: Secondary | ICD-10-CM | POA: Insufficient documentation

## 2019-07-28 DIAGNOSIS — K641 Second degree hemorrhoids: Secondary | ICD-10-CM | POA: Insufficient documentation

## 2019-07-28 DIAGNOSIS — Z79899 Other long term (current) drug therapy: Secondary | ICD-10-CM | POA: Insufficient documentation

## 2019-07-28 DIAGNOSIS — Z882 Allergy status to sulfonamides status: Secondary | ICD-10-CM | POA: Diagnosis not present

## 2019-07-28 DIAGNOSIS — K648 Other hemorrhoids: Secondary | ICD-10-CM

## 2019-07-28 DIAGNOSIS — R195 Other fecal abnormalities: Secondary | ICD-10-CM | POA: Diagnosis not present

## 2019-07-28 DIAGNOSIS — E119 Type 2 diabetes mellitus without complications: Secondary | ICD-10-CM | POA: Diagnosis not present

## 2019-07-28 DIAGNOSIS — Z7982 Long term (current) use of aspirin: Secondary | ICD-10-CM | POA: Insufficient documentation

## 2019-07-28 HISTORY — PX: COLONOSCOPY WITH PROPOFOL: SHX5780

## 2019-07-28 SURGERY — COLONOSCOPY WITH PROPOFOL
Anesthesia: Monitor Anesthesia Care

## 2019-07-28 MED ORDER — SODIUM CHLORIDE 0.9 % IV SOLN: INTRAVENOUS | Status: DC

## 2019-07-28 MED ORDER — LACTATED RINGERS IV SOLN
INTRAVENOUS | Status: DC
  Administered 2019-07-28: 12:00:00 via INTRAVENOUS

## 2019-07-28 MED ORDER — PROPOFOL 500 MG/50ML IV EMUL
INTRAVENOUS | Status: DC | PRN
  Administered 2019-07-28: 150 ug/kg/min via INTRAVENOUS

## 2019-07-28 MED ORDER — PROPOFOL 10 MG/ML IV BOLUS
INTRAVENOUS | Status: AC
  Filled 2019-07-28: qty 60

## 2019-07-28 MED ORDER — EPHEDRINE SULFATE-NACL 50-0.9 MG/10ML-% IV SOSY
PREFILLED_SYRINGE | INTRAVENOUS | Status: DC | PRN
  Administered 2019-07-28 (×4): 10 mg via INTRAVENOUS

## 2019-07-28 MED ORDER — LIDOCAINE 2% (20 MG/ML) 5 ML SYRINGE
INTRAMUSCULAR | Status: DC | PRN
  Administered 2019-07-28: 60 mg via INTRAVENOUS

## 2019-07-28 MED ORDER — PROPOFOL 10 MG/ML IV BOLUS
INTRAVENOUS | Status: DC | PRN
  Administered 2019-07-28 (×2): 20 mg via INTRAVENOUS

## 2019-07-28 SURGICAL SUPPLY — 21 items

## 2019-07-28 NOTE — Discharge Instructions (Addendum)
° ° °  I found swollen hemorrhoids that I suspect were the source of the blood in the stool. Also saw diverticulosis - common and not usually a problem.  No polyps, no cancer and no more routine stool tests or colonoscopy needed.  If you have hemorrhoid problems (swelling, itching, bleeding) I am able to treat those with an in-office procedure. If you like, please call my office at [phone] to schedule an appointment and I can evaluate you further.   I appreciate the opportunity to care for you. [name], MD, FACG  YOU HAD AN ENDOSCOPIC PROCEDURE TODAY: Refer to the procedure report and other information in the discharge instructions given to you for any specific questions about what was found during the examination. If this information does not answer your questions, please call Dr. Celesta Aver office at [phone] to clarify.   YOU SHOULD EXPECT: Some feelings of bloating in the abdomen. Passage of more gas than usual. Walking can help get rid of the air that was put into your GI tract during the procedure and reduce the bloating. If you had a lower endoscopy (such as a colonoscopy or flexible sigmoidoscopy) you may notice spotting of blood in your stool or on the toilet paper. Some abdominal soreness may be present for a day or two, also.  DIET: Your first meal following the procedure should be a light meal and then it is ok to progress to your normal diet. A half-sandwich or bowl of soup is an example of a good first meal. Heavy or fried foods are harder to digest and may make you feel nauseous or bloated. Drink plenty of fluids but you should avoid alcoholic beverages for 24 hours.   ACTIVITY: Your care partner should take you home directly after the procedure. You should plan to take it easy, moving slowly for the rest of the day. You can resume normal activity the day after the procedure however YOU SHOULD NOT DRIVE, use power tools, machinery or perform tasks that involve climbing  or major physical exertion for 24 hours (because of the sedation medicines used during the test).   SYMPTOMS TO REPORT IMMEDIATELY: A gastroenterologist can be reached at any hour. Please call [phone]  for any of the following symptoms:  Following lower endoscopy (colonoscopy, flexible sigmoidoscopy) Excessive amounts of blood in the stool  Significant tenderness, worsening of abdominal pains  Swelling of the abdomen that is new, acute  Fever of 100 or higher

## 2019-07-28 NOTE — Op Note (Signed)
[facility] Patient Name: Matthew Joyce Procedure Date: 07/28/2019 MRN: [id number] Attending MD: [name] , MD Date of Birth: 04-Jan-1951 CSN: [id number] Age: 68 Admit Type: Outpatient Procedure:                Colonoscopy Indications:              Heme positive stool, Positive fecal immunochemical                            test Providers:                [name], MD, Glori Bickers, RN, Ashley Jacobs, RN, Cherylynn Ridges, Technician, Caryl Pina CRNA Referring MD:             Kathlene November, MD Medicines:                Propofol per Anesthesia, Monitored Anesthesia Care Complications:            No immediate complications. Estimated Blood Loss:     Estimated blood loss: none. Procedure:                Pre-Anesthesia Assessment:                           - Prior to the procedure, a History and Physical                            was performed, and patient medications and                            allergies were reviewed. The patient's tolerance of                            previous anesthesia was also reviewed. The risks                            and benefits of the procedure and the sedation                            options and risks were discussed with the patient.                            All questions were answered, and informed consent                            was obtained. Prior Anticoagulants: The patient has                            taken no previous anticoagulant or antiplatelet                            agents.  ASA Grade Assessment: III - A patient with                            severe systemic disease. After reviewing the risks                            and benefits, the patient was deemed in                            satisfactory condition to undergo the procedure.                           After obtaining informed consent, the colonoscope                            was passed under  direct vision. Throughout the                            procedure, the patient's blood pressure, pulse, and                            oxygen saturations were monitored continuously. The                            CF-HQ190L ([id number]) Olympus colonoscope was                            introduced through the anus and advanced to the the                            cecum, identified by appendiceal orifice and                            ileocecal valve. The colonoscopy was performed                            without difficulty. The patient tolerated the                            procedure well. The quality of the bowel                            preparation was excellent. The bowel preparation                            used was Miralax via split dose instruction. The                            ileocecal valve, appendiceal orifice, and rectum                            were photographed. Scope In: 12:12:15 PM Scope Out: 12:27:16 PM Scope Withdrawal Time: 0 hours 0 minutes 2 seconds  Total Procedure Duration: 0 hours 15 minutes 1  second  Findings:      The perianal and digital rectal examinations were normal. Pertinent       negatives include normal prostate (size, shape, and consistency).      External and internal hemorrhoids were found. The hemorrhoids were Grade       II (internal hemorrhoids that prolapse but reduce spontaneously).      Multiple diverticula were found in the sigmoid colon.      The exam was otherwise without abnormality on direct and retroflexion       views. Impression:               - External and internal hemorrhoids. BELIEVE CAUSE                            OF HEME + STOOL - IF SXS COULD BAND IF DESIRED                           - Diverticulosis in the sigmoid colon.                           - The examination was otherwise normal on direct                            and retroflexion views.                           - No specimens collected. Moderate Sedation:       Not Applicable - Patient had care per Anesthesia. Recommendation:           - Patient has a contact number available for                            emergencies. The signs and symptoms of potential                            delayed complications were discussed with the                            patient. Return to normal activities tomorrow.                            Written discharge instructions were provided to the                            patient.                           - Resume previous diet.                           - Continue present medications.                           - No repeat colonoscopy due to current age (69  years or older) and the absence of colonic polyps.                           - Return to GI clinic PRN. DOES NOT NEED ANYMORE                            ROUTINE COLON CANCER SCREENING WITH STOOL TESTS OR                            COLONOSCOPY Procedure Code(s):        --- Professional ---                           [phone], Colonoscopy, flexible; diagnostic, including                            collection of specimen(s) by brushing or washing,                            when performed (separate procedure) Diagnosis Code(s):        --- Professional ---                           K64.1, Second degree hemorrhoids                           R19.5, Other fecal abnormalities                           K57.30, Diverticulosis of large intestine without                            perforation or abscess without bleeding CPT copyright 2019 [employer]. All rights reserved. The codes documented in this report are preliminary and upon coder review may  be revised to meet current compliance requirements. [name], MD 07/28/2019 12:41:37 PM This report has been signed electronically. Number of Addenda: 0

## 2019-07-28 NOTE — Transfer of Care (Signed)
Immediate Anesthesia Transfer of Care Note  Patient: Matthew Joyce  Procedure(s) Performed: COLONOSCOPY WITH PROPOFOL (N/A )  Patient Location: Endoscopy Unit  Anesthesia Type:MAC  Level of Consciousness: drowsy and responds to stimulation  Airway & Oxygen Therapy: Patient Spontanous Breathing and Patient connected to face mask oxygen  Post-op Assessment: Report given to RN, Post -op Vital signs reviewed and stable and Patient moving all extremities  Post vital signs: Reviewed and stable  Last Vitals:  Vitals Value Taken Time  BP 111/58 07/28/19 1233  Temp 36.5 C 07/28/19 1233  Pulse 69 07/28/19 1233  Resp 14 07/28/19 1233  SpO2 100 % 07/28/19 1233    Last Pain:  Vitals:   07/28/19 1233  TempSrc: Temporal  PainSc: Asleep         Complications: No apparent anesthesia complications

## 2019-07-28 NOTE — Anesthesia Preprocedure Evaluation (Addendum)
Anesthesia Evaluation  Patient identified by MRN, date of birth, ID band Patient awake    Reviewed: Allergy & Precautions, NPO status , Patient's Chart, lab work & pertinent test results  History of Anesthesia Complications Negative for: history of anesthetic complications  Airway Mallampati: II  TM Distance: >3 FB Neck ROM: Full    Dental  (+) Dental Advisory Given   Pulmonary former smoker,    Pulmonary exam normal        Cardiovascular (-) angina+ CAD, + CABG (2006) and +CHF  Normal cardiovascular exam+ dysrhythmias + Cardiac Defibrillator    '17 TTE - LV cavity size was mildly dilated. EF 30% to 35%. Doppler parameters are consistent with restrictive physiology, indicative of decreased left ventricular diastolic compliance and/or increased left atrial pressure. Moderate MR. Moderately dilated LA, mildly dilated RA. RV cavity size was mildly dilated. Pacer wire or catheter noted in right ventricle. RV systolic function was mildly reduced. PA peak pressure: 51 mm Hg     Neuro/Psych negative neurological ROS  negative psych ROS   GI/Hepatic negative GI ROS, Neg liver ROS,   Endo/Other  negative endocrine ROS  Renal/GU negative Renal ROS     Musculoskeletal negative musculoskeletal ROS (+)   Abdominal   Peds  Hematology negative hematology ROS (+)   Anesthesia Other Findings   Reproductive/Obstetrics                            Anesthesia Physical Anesthesia Plan  ASA: III  Anesthesia Plan: MAC   Post-op Pain Management:    Induction: Intravenous  PONV Risk Score and Plan: 1 and Propofol infusion and Treatment may vary due to age or medical condition  Airway Management Planned: Nasal Cannula and Natural Airway  Additional Equipment: None  Intra-op Plan:   Post-operative Plan:   Informed Consent: I have reviewed the patients History and Physical, chart, labs and discussed the  procedure including the risks, benefits and alternatives for the proposed anesthesia with the patient or authorized representative who has indicated his/her understanding and acceptance.       Plan Discussed with: CRNA and Anesthesiologist  Anesthesia Plan Comments:        Anesthesia Quick Evaluation

## 2019-07-28 NOTE — Anesthesia Postprocedure Evaluation (Signed)
Anesthesia Post Note  Patient: Matthew Joyce  Procedure(s) Performed: COLONOSCOPY WITH PROPOFOL (N/A )     Patient location during evaluation: PACU Anesthesia Type: MAC Level of consciousness: awake and alert Pain management: pain level controlled Vital Signs Assessment: post-procedure vital signs reviewed and stable Respiratory status: spontaneous breathing, nonlabored ventilation and respiratory function stable Cardiovascular status: stable and blood pressure returned to baseline Anesthetic complications: no    Last Vitals:  Vitals:   07/28/19 1240 07/28/19 1244  BP: (!) 117/46 (!) 115/56  Pulse: 63 65  Resp: 19 19  Temp:    SpO2: 100% 98%    Last Pain:  Vitals:   07/28/19 1244  TempSrc:   PainSc: 0-No pain                 Audry Pili

## 2019-07-28 NOTE — Interval H&P Note (Signed)
History and Physical Interval Note:  07/28/2019 11:54 AM  Matthew Joyce  has presented today for surgery, with the diagnosis of HEME POSITIVE STOOL.  The various methods of treatment have been discussed with the patient and family. After consideration of risks, benefits and other options for treatment, the patient has consented to  Procedure(s): COLONOSCOPY WITH PROPOFOL (N/A) as a surgical intervention.  The patient's history has been reviewed, patient examined, no change in status, stable for surgery.  I have reviewed the patient's chart and labs.  Questions were answered to the patient's satisfaction.     Silvano Rusk

## 2019-07-29 ENCOUNTER — Encounter (HOSPITAL_COMMUNITY): Payer: Self-pay | Admitting: Internal Medicine

## 2019-08-02 ENCOUNTER — Ambulatory Visit (INDEPENDENT_AMBULATORY_CARE_PROVIDER_SITE_OTHER): Payer: Medicare HMO | Admitting: *Deleted

## 2019-08-02 DIAGNOSIS — I428 Other cardiomyopathies: Secondary | ICD-10-CM | POA: Diagnosis not present

## 2019-08-02 DIAGNOSIS — C770 Secondary and unspecified malignant neoplasm of lymph nodes of head, face and neck: Secondary | ICD-10-CM | POA: Diagnosis not present

## 2019-08-02 DIAGNOSIS — Z923 Personal history of irradiation: Secondary | ICD-10-CM | POA: Diagnosis not present

## 2019-08-02 DIAGNOSIS — R590 Localized enlarged lymph nodes: Secondary | ICD-10-CM | POA: Diagnosis not present

## 2019-08-02 DIAGNOSIS — I5022 Chronic systolic (congestive) heart failure: Secondary | ICD-10-CM

## 2019-08-02 DIAGNOSIS — I429 Cardiomyopathy, unspecified: Secondary | ICD-10-CM

## 2019-08-02 DIAGNOSIS — C801 Malignant (primary) neoplasm, unspecified: Secondary | ICD-10-CM | POA: Diagnosis not present

## 2019-08-02 LAB — CUP PACEART REMOTE DEVICE CHECK
Battery Remaining Longevity: 82 mo
Battery Remaining Percentage: 80 %
Battery Voltage: 2.98 V
Brady Statistic RV Percent Paced: 1.6 %
Date Time Interrogation Session: 20200818080015
HighPow Impedance: 51 Ohm
HighPow Impedance: 51 Ohm
Implantable Lead Implant Date: 20090618
Implantable Lead Location: 753860
Implantable Lead Model: 7121
Implantable Pulse Generator Implant Date: 20180220
Lead Channel Impedance Value: 460 Ohm
Lead Channel Pacing Threshold Amplitude: 1 V
Lead Channel Pacing Threshold Pulse Width: 0.8 ms
Lead Channel Sensing Intrinsic Amplitude: 5.4 mV
Lead Channel Setting Pacing Amplitude: 2.5 V
Lead Channel Setting Pacing Pulse Width: 0.8 ms
Lead Channel Setting Sensing Sensitivity: 0.5 mV
Pulse Gen Serial Number: 7406967

## 2019-08-06 ENCOUNTER — Other Ambulatory Visit (HOSPITAL_COMMUNITY): Payer: Medicare HMO

## 2019-08-09 DIAGNOSIS — C770 Secondary and unspecified malignant neoplasm of lymph nodes of head, face and neck: Secondary | ICD-10-CM | POA: Diagnosis not present

## 2019-08-09 DIAGNOSIS — C801 Malignant (primary) neoplasm, unspecified: Secondary | ICD-10-CM | POA: Diagnosis not present

## 2019-08-09 DIAGNOSIS — Z923 Personal history of irradiation: Secondary | ICD-10-CM | POA: Diagnosis not present

## 2019-08-09 LAB — TSH: TSH: 5.87 (ref <=5.90)

## 2019-08-10 ENCOUNTER — Encounter: Payer: Self-pay | Admitting: Cardiology

## 2019-08-10 NOTE — Progress Notes (Signed)
Remote ICD transmission.   

## 2019-08-15 ENCOUNTER — Telehealth: Payer: Self-pay | Admitting: Internal Medicine

## 2019-08-15 NOTE — Telephone Encounter (Signed)
Received fax from [facility] regards TFTs: TSH 5.8 slightly elevated, T4 0.8, normal. Plan: Monitor TFTs regularly.

## 2019-09-01 DIAGNOSIS — R69 Illness, unspecified: Secondary | ICD-10-CM | POA: Diagnosis not present

## 2019-09-20 ENCOUNTER — Other Ambulatory Visit: Payer: Self-pay

## 2019-09-20 ENCOUNTER — Other Ambulatory Visit (HOSPITAL_COMMUNITY): Payer: Self-pay | Admitting: *Deleted

## 2019-09-20 ENCOUNTER — Encounter (HOSPITAL_COMMUNITY): Admission: RE | Disposition: A | Discharge status: Home / Self Care | Payer: Self-pay | Attending: Internal Medicine

## 2019-09-20 ENCOUNTER — Telehealth (HOSPITAL_COMMUNITY): Payer: Self-pay | Admitting: *Deleted

## 2019-09-20 ENCOUNTER — Ambulatory Visit (HOSPITAL_BASED_OUTPATIENT_CLINIC_OR_DEPARTMENT_OTHER)
Admission: RE | Admit: 2019-09-20 | Discharge: 2019-09-20 | Disposition: A | Discharge status: Home / Self Care | Payer: Medicare HMO | Source: Ambulatory Visit | Attending: Internal Medicine | Admitting: Internal Medicine

## 2019-09-20 ENCOUNTER — Ambulatory Visit (HOSPITAL_COMMUNITY)
Admission: RE | Admit: 2019-09-20 | Discharge: 2019-09-20 | Disposition: A | Discharge status: Home / Self Care | Payer: Medicare HMO | Source: Other Acute Inpatient Hospital | Attending: Internal Medicine | Admitting: Internal Medicine

## 2019-09-20 DIAGNOSIS — I5023 Acute on chronic systolic (congestive) heart failure: Secondary | ICD-10-CM

## 2019-09-20 DIAGNOSIS — Z951 Presence of aortocoronary bypass graft: Secondary | ICD-10-CM

## 2019-09-20 DIAGNOSIS — Z87891 Personal history of nicotine dependence: Secondary | ICD-10-CM | POA: Diagnosis not present

## 2019-09-20 DIAGNOSIS — I4510 Unspecified right bundle-branch block: Secondary | ICD-10-CM | POA: Diagnosis not present

## 2019-09-20 DIAGNOSIS — Z923 Personal history of irradiation: Secondary | ICD-10-CM | POA: Insufficient documentation

## 2019-09-20 DIAGNOSIS — Z8249 Family history of ischemic heart disease and other diseases of the circulatory system: Secondary | ICD-10-CM | POA: Diagnosis not present

## 2019-09-20 DIAGNOSIS — I255 Ischemic cardiomyopathy: Secondary | ICD-10-CM | POA: Diagnosis not present

## 2019-09-20 DIAGNOSIS — Z9221 Personal history of antineoplastic chemotherapy: Secondary | ICD-10-CM | POA: Diagnosis not present

## 2019-09-20 DIAGNOSIS — Z888 Allergy status to other drugs, medicaments and biological substances status: Secondary | ICD-10-CM | POA: Diagnosis not present

## 2019-09-20 DIAGNOSIS — Z882 Allergy status to sulfonamides status: Secondary | ICD-10-CM | POA: Insufficient documentation

## 2019-09-20 DIAGNOSIS — M109 Gout, unspecified: Secondary | ICD-10-CM | POA: Insufficient documentation

## 2019-09-20 DIAGNOSIS — E119 Type 2 diabetes mellitus without complications: Secondary | ICD-10-CM | POA: Insufficient documentation

## 2019-09-20 DIAGNOSIS — Z95810 Presence of automatic (implantable) cardiac defibrillator: Secondary | ICD-10-CM | POA: Insufficient documentation

## 2019-09-20 DIAGNOSIS — Z79899 Other long term (current) drug therapy: Secondary | ICD-10-CM | POA: Insufficient documentation

## 2019-09-20 DIAGNOSIS — R079 Chest pain, unspecified: Secondary | ICD-10-CM

## 2019-09-20 DIAGNOSIS — I452 Bifascicular block: Secondary | ICD-10-CM | POA: Insufficient documentation

## 2019-09-20 DIAGNOSIS — Z7982 Long term (current) use of aspirin: Secondary | ICD-10-CM | POA: Insufficient documentation

## 2019-09-20 DIAGNOSIS — Z20828 Contact with and (suspected) exposure to other viral communicable diseases: Secondary | ICD-10-CM | POA: Diagnosis not present

## 2019-09-20 DIAGNOSIS — Z85819 Personal history of malignant neoplasm of unspecified site of lip, oral cavity, and pharynx: Secondary | ICD-10-CM | POA: Diagnosis not present

## 2019-09-20 DIAGNOSIS — I2510 Atherosclerotic heart disease of native coronary artery without angina pectoris: Secondary | ICD-10-CM | POA: Diagnosis not present

## 2019-09-20 DIAGNOSIS — I5022 Chronic systolic (congestive) heart failure: Secondary | ICD-10-CM

## 2019-09-20 DIAGNOSIS — R0789 Other chest pain: Secondary | ICD-10-CM

## 2019-09-20 DIAGNOSIS — E785 Hyperlipidemia, unspecified: Secondary | ICD-10-CM | POA: Insufficient documentation

## 2019-09-20 HISTORY — PX: RIGHT/LEFT HEART CATH AND CORONARY/GRAFT ANGIOGRAPHY: CATH118267

## 2019-09-20 LAB — POCT I-STAT 7, (LYTES, BLD GAS, ICA,H+H)
Acid-base deficit: 2 mmol/L (ref 0.0–2.0)
Bicarbonate: 22 mmol/L (ref 20.0–28.0)
Calcium, Ion: 1.15 mmol/L (ref 1.15–1.40)
HCT: 39 % (ref 39.0–52.0)
Hemoglobin: 13.3 g/dL (ref 13.0–17.0)
O2 Saturation: 99 %
Potassium: 3.7 mmol/L (ref 3.5–5.1)
Sodium: 139 mmol/L (ref 135–145)
TCO2: 23 mmol/L (ref 22–32)
pCO2 arterial: 36 mmHg (ref 32.0–48.0)
pH, Arterial: 7.393 (ref 7.350–7.450)
pO2, Arterial: 126 mmHg — ABNORMAL HIGH (ref 83.0–108.0)

## 2019-09-20 LAB — POCT I-STAT EG7
Acid-base deficit: 1 mmol/L (ref 0.0–2.0)
Bicarbonate: 25.1 mmol/L (ref 20.0–28.0)
Bicarbonate: 24.1 mmol/L (ref 20.0–28.0)
Calcium, Ion: 1.08 mmol/L — ABNORMAL LOW (ref 1.15–1.40)
Calcium, Ion: 1.24 mmol/L (ref 1.15–1.40)
HCT: 37 % — ABNORMAL LOW (ref 39.0–52.0)
HCT: 39 % (ref 39.0–52.0)
Hemoglobin: 12.6 g/dL — ABNORMAL LOW (ref 13.0–17.0)
Hemoglobin: 13.3 g/dL (ref 13.0–17.0)
O2 Saturation: 70 %
O2 Saturation: 67 %
Potassium: 3.5 mmol/L (ref 3.5–5.1)
Potassium: 3.8 mmol/L (ref 3.5–5.1)
Sodium: 142 mmol/L (ref 135–145)
Sodium: 138 mmol/L (ref 135–145)
TCO2: 26 mmol/L (ref 22–32)
TCO2: 25 mmol/L (ref 22–32)
pCO2, Ven: 39.7 mmHg — ABNORMAL LOW (ref 44.0–60.0)
pCO2, Ven: 40 mmHg — ABNORMAL LOW (ref 44.0–60.0)
pH, Ven: 7.409 (ref 7.250–7.430)
pH, Ven: 7.387 (ref 7.250–7.430)
pO2, Ven: 36 mmHg (ref 32.0–45.0)
pO2, Ven: 35 mmHg (ref 32.0–45.0)

## 2019-09-20 LAB — BASIC METABOLIC PANEL
Anion gap: 10 (ref 5–15)
BUN: 12 mg/dL (ref 8–23)
CO2: 24 mmol/L (ref 22–32)
Calcium: 9.3 mg/dL (ref 8.9–10.3)
Chloride: 102 mmol/L (ref 98–111)
Creatinine, Ser: 0.98 mg/dL (ref 0.61–1.24)
GFR calc Af Amer: >60 mL/min (ref >60)
GFR calc non Af Amer: >60 mL/min (ref >60)
Glucose, Bld: 106 mg/dL — ABNORMAL HIGH (ref 70–99)
Potassium: 4.1 mmol/L (ref 3.5–5.1)
Sodium: 136 mmol/L (ref 135–145)

## 2019-09-20 LAB — CBC
HCT: 41.9 % (ref 39.0–52.0)
Hemoglobin: 13.3 g/dL (ref 13.0–17.0)
MCH: 29.2 pg (ref 26.0–34.0)
MCHC: 31.7 g/dL (ref 30.0–36.0)
MCV: 92.1 fL (ref 80.0–100.0)
Platelets: 156 10*3/uL (ref 150–400)
RBC: 4.55 MIL/uL (ref 4.22–5.81)
RDW: 13.6 % (ref 11.5–15.5)
WBC: 6.2 10*3/uL (ref 4.0–10.5)
nRBC: 0 % (ref 0.0–0.2)

## 2019-09-20 LAB — PROTIME-INR
INR: 1.2 (ref 0.8–1.2)
Prothrombin Time: 15.5 seconds — ABNORMAL HIGH (ref 11.4–15.2)

## 2019-09-20 LAB — SARS CORONAVIRUS 2 BY RT PCR (HOSPITAL ORDER, PERFORMED IN ~~LOC~~ HOSPITAL LAB): SARS Coronavirus 2: NEGATIVE

## 2019-09-20 SURGERY — RIGHT/LEFT HEART CATH AND CORONARY/GRAFT ANGIOGRAPHY
Anesthesia: LOCAL

## 2019-09-20 MED ORDER — HEPARIN (PORCINE) IN NACL 1000-0.9 UT/500ML-% IV SOLN
INTRAVENOUS | Status: DC | PRN
  Administered 2019-09-20 (×2): 500 mL

## 2019-09-20 MED ORDER — MIDAZOLAM HCL 2 MG/2ML IJ SOLN
INTRAMUSCULAR | Status: DC | PRN
  Administered 2019-09-20: 1 mg via INTRAVENOUS

## 2019-09-20 MED ORDER — VERAPAMIL HCL 2.5 MG/ML IV SOLN
INTRAVENOUS | Status: AC
  Filled 2019-09-20: qty 2

## 2019-09-20 MED ORDER — LIDOCAINE HCL (PF) 1 % IJ SOLN
INTRAMUSCULAR | Status: DC | PRN
  Administered 2019-09-20 (×2): 2 mL

## 2019-09-20 MED ORDER — SODIUM CHLORIDE 0.9 % IV SOLN: INTRAVENOUS | Status: AC

## 2019-09-20 MED ORDER — ACETAMINOPHEN 325 MG PO TABS: 650.0000 mg | ORAL_TABLET | ORAL | Status: DC | PRN

## 2019-09-20 MED ORDER — LABETALOL HCL 5 MG/ML IV SOLN: 10.0000 mg | INTRAVENOUS | Status: DC | PRN

## 2019-09-20 MED ORDER — LIDOCAINE HCL (PF) 1 % IJ SOLN
INTRAMUSCULAR | Status: AC
  Filled 2019-09-20: qty 30

## 2019-09-20 MED ORDER — SODIUM CHLORIDE 0.9 % IV SOLN: 250.0000 mL | INTRAVENOUS | Status: DC | PRN

## 2019-09-20 MED ORDER — HEPARIN SODIUM (PORCINE) 1000 UNIT/ML IJ SOLN
INTRAMUSCULAR | Status: AC
  Filled 2019-09-20: qty 1

## 2019-09-20 MED ORDER — SODIUM CHLORIDE 0.9% FLUSH: 3.0000 mL | Freq: Two times a day (BID) | INTRAVENOUS | Status: DC

## 2019-09-20 MED ORDER — IOHEXOL 350 MG/ML SOLN
INTRAVENOUS | Status: DC | PRN
  Administered 2019-09-20: 125 mL via INTRA_ARTERIAL

## 2019-09-20 MED ORDER — ASPIRIN 81 MG PO CHEW
81.0000 mg | CHEWABLE_TABLET | ORAL | Status: AC
  Administered 2019-09-20: 13:00:00 81 mg via ORAL

## 2019-09-20 MED ORDER — SODIUM CHLORIDE 0.9% FLUSH: 3.0000 mL | INTRAVENOUS | Status: DC | PRN

## 2019-09-20 MED ORDER — HEPARIN (PORCINE) IN NACL 1000-0.9 UT/500ML-% IV SOLN
INTRAVENOUS | Status: AC
  Filled 2019-09-20: qty 500

## 2019-09-20 MED ORDER — FENTANYL CITRATE (PF) 100 MCG/2ML IJ SOLN
INTRAMUSCULAR | Status: DC | PRN
  Administered 2019-09-20: 25 ug via INTRAVENOUS

## 2019-09-20 MED ORDER — SODIUM CHLORIDE 0.9 % IV SOLN
INTRAVENOUS | Status: DC
  Administered 2019-09-20: 13:00:00 via INTRAVENOUS

## 2019-09-20 MED ORDER — ONDANSETRON HCL 4 MG/2ML IJ SOLN: 4.0000 mg | Freq: Four times a day (QID) | INTRAMUSCULAR | Status: DC | PRN

## 2019-09-20 MED ORDER — HYDRALAZINE HCL 20 MG/ML IJ SOLN: 10.0000 mg | INTRAMUSCULAR | Status: DC | PRN

## 2019-09-20 MED ORDER — MIDAZOLAM HCL 2 MG/2ML IJ SOLN
INTRAMUSCULAR | Status: AC
  Filled 2019-09-20: qty 2

## 2019-09-20 MED ORDER — FENTANYL CITRATE (PF) 100 MCG/2ML IJ SOLN
INTRAMUSCULAR | Status: AC
  Filled 2019-09-20: qty 2

## 2019-09-20 MED ORDER — VERAPAMIL HCL 2.5 MG/ML IV SOLN
INTRAVENOUS | Status: DC | PRN
  Administered 2019-09-20: 10 mL via INTRA_ARTERIAL

## 2019-09-20 MED ORDER — HEPARIN SODIUM (PORCINE) 1000 UNIT/ML IJ SOLN
INTRAMUSCULAR | Status: DC | PRN
  Administered 2019-09-20: 3500 [IU] via INTRAVENOUS

## 2019-09-20 MED ORDER — ASPIRIN 81 MG PO CHEW
CHEWABLE_TABLET | ORAL | Status: AC
  Filled 2019-09-20: qty 1

## 2019-09-20 SURGICAL SUPPLY — 13 items
CATH 5FR JL3.5 JR4 ANG PIG MP (CATHETERS) ×1 IMPLANT
CATH BALLN WEDGE 5F 110CM (CATHETERS) ×1 IMPLANT
CATH EXPO 5F MPA-1 (CATHETERS) ×1 IMPLANT
CATH INFINITI 5 FR LCB (CATHETERS) ×1 IMPLANT
CATH INFINITI 5FR AL1 (CATHETERS) ×1 IMPLANT
DEVICE RAD COMP TR BAND LRG (VASCULAR PRODUCTS) ×1 IMPLANT
GLIDESHEATH SLEND SS 6F .021 (SHEATH) ×1 IMPLANT
GUIDEWIRE INQWIRE 1.5J.035X260 (WIRE) IMPLANT
INQWIRE 1.5J .035X260CM (WIRE) ×2
PACK CARDIAC CATHETERIZATION (CUSTOM PROCEDURE TRAY) ×2 IMPLANT
SHEATH GLIDE SLENDER 4/5FR (SHEATH) ×1 IMPLANT
SYR MEDRAD MARK 7 150ML (SYRINGE) ×1 IMPLANT
TRANSDUCER W/STOPCOCK (MISCELLANEOUS) ×2 IMPLANT

## 2019-09-20 NOTE — Discharge Instructions (Signed)
Radial Site Care ° °This sheet gives you information about how to care for yourself after your procedure. Your health care provider may also give you more specific instructions. If you have problems or questions, contact your health care provider. °What can I expect after the procedure? °After the procedure, it is common to have: °· Bruising and tenderness at the catheter insertion area. °Follow these instructions at home: °Medicines °· Take over-the-counter and prescription medicines only as told by your health care provider. °Insertion site care °· Follow instructions from your health care provider about how to take care of your insertion site. Make sure you: °? Wash your hands with soap and water before you change your bandage (dressing). If soap and water are not available, use hand sanitizer. °? Change your dressing as told by your health care provider. °? Leave stitches (sutures), skin glue, or adhesive strips in place. These skin closures may need to stay in place for 2 weeks or longer. If adhesive strip edges start to loosen and curl up, you may trim the loose edges. Do not remove adhesive strips completely unless your health care provider tells you to do that. °· Check your insertion site every day for signs of infection. Check for: °? Redness, swelling, or pain. °? Fluid or blood. °? Pus or a bad smell. °? Warmth. °· Do not take baths, swim, or use a hot tub until your health care provider approves. °· You may shower 24-48 hours after the procedure, or as directed by your health care provider. °? Remove the dressing and gently wash the site with plain soap and water. °? Pat the area dry with a clean towel. °? Do not rub the site. That could cause bleeding. °· Do not apply powder or lotion to the site. °Activity ° °· For 24 hours after the procedure, or as directed by your health care provider: °? Do not flex or bend the affected arm. °? Do not push or pull heavy objects with the affected arm. °? Do not  drive yourself home from the hospital or clinic. You may drive 24 hours after the procedure unless your health care provider tells you not to. °? Do not operate machinery or power tools. °· Do not lift anything that is heavier than 10 lb (4.5 kg), or the limit that you are told, until your health care provider says that it is safe. °· Ask your health care provider when it is okay to: °? Return to work or school. °? Resume usual physical activities or sports. °? Resume sexual activity. °General instructions °· If the catheter site starts to bleed, raise your arm and put firm pressure on the site. If the bleeding does not stop, get help right away. This is a medical emergency. °· If you went home on the same day as your procedure, a responsible adult should be with you for the first 24 hours after you arrive home. °· Keep all follow-up visits as told by your health care provider. This is important. °Contact a health care provider if: °· You have a fever. °· You have redness, swelling, or yellow drainage around your insertion site. °Get help right away if: °· You have unusual pain at the radial site. °· The catheter insertion area swells very fast. °· The insertion area is bleeding, and the bleeding does not stop when you hold steady pressure on the area. °· Your arm or hand becomes pale, cool, tingly, or numb. °These symptoms may represent a serious problem   that is an emergency. Do not wait to see if the symptoms will go away. Get medical help right away. Call your local emergency services (911 in the [place] Do not drive yourself to the hospital. °Summary °· After the procedure, it is common to have bruising and tenderness at the site. °· Follow instructions from your health care provider about how to take care of your radial site wound. Check the wound every day for signs of infection. °· Do not lift anything that is heavier than 10 lb (4.5 kg), or the limit that you are told, until your health care provider says  that it is safe. °This information is not intended to replace advice given to you by your health care provider. Make sure you discuss any questions you have with your health care provider. °Document Released: 01/03/2011 Document Revised: 01/06/2018 Document Reviewed: 01/06/2018 °Elsevier Patient Education © 2020 [employer] ° °

## 2019-09-20 NOTE — Progress Notes (Signed)
Per Dr Haroldine Laws EF looked bad on heart cath and pt needs echo in next day or 2 to evaluate.  Order placed, will schedule

## 2019-09-20 NOTE — Progress Notes (Signed)
Heart Failure TeleHealth Note  Due to national recommendations of social distancing due to [place] 19, Audio/video telehealth visit is felt to be most appropriate for this patient at this time.  See MyChart message from today for patient consent regarding telehealth for [facility].  Date:  09/20/2019   ID:  Matthew Romans Sr., DOB November 02, 1951, MRN [id number]  Location: Home  Provider location: [facility] Type of Visit: Established patient  PCP:  Colon Branch, MD  Cardiologist:  No primary care provider on file. Primary HF: Arta Stump  Chief Complaint: Heart Failure follow-up   History of Present Illness:  [name] is a 68 y.o. male with a history of a CAD S/P CABG in 2006 (LIMA -> LAD, SVG -> Ramus, SVG -> OM-2, SVG -> RCA), s/p ICD, ischemic MR, DM, hyperlipidemia, and throat cancer s/p chemo/XRT in 2018  Stress test 08/14/2016 with EF 31%, old infarct, no ischemia.   He presents via [employer] (consulting) for a telehealth visit today.   He called me last night saying that over the past few days he has been having chest tightness, exertional dyspnea and orthopnea much like he had prior to his CABG. Weight was up 5 pounds. I prescribed a dose of lasix 40 mg (he was not on diuretics previously) and we set up a call for this am. He says breathing is much better but his chest is still tight and gets worse with activity. He denies fevers, chills or productive cough. No significant LE edema    Echo 07/2016 LVEF 30-35%, Mod MR, Mod LAE, Mildly dilated RV with mild reduced RV function, PA peak pressure 51 mm Hg.   Matthew D Diviney Sr. denies symptoms worrisome for COVID 19.   Past Medical History:  Diagnosis Date  . AICD (automatic cardioverter/defibrillator) present 6/09   St. Jude  . Bradycardia    Use of beta blocker limited  . CAD (coronary artery disease)    a- S/p cabg in march 2006 by Dr.Owen;  b. myoview 5/12: large IL scar from apex to base, no  ischemia, EF 37%  . Cancer ([place])    on right side of neck  . ED (erectile dysfunction)   . Gout   . Hyperlipidemia   . Ischemic cardiomyopathy    a-Echocardiogram, Nov 2006, showed ejection fraction of 30-40% with mild to moderated mitral  regurgitation and mild aortic regurgitation b- Cardiac MRI, May 2007, EF of 44% with 50% scar involving  the inferolateral walls. No comment of mitral regurgitation. c- last echo  11/10: EF 30-35%  mod MR d- Nega T-Wave alternans testing in May of 2007 e- no ACE due to low BP;  f. CPX 3/11: normal  . 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 Systolic CHF, chronic ([place])    Past Surgical History:  Procedure Laterality Date  . CARDIAC DEFIBRILLATOR PLACEMENT  06/01/08   ICD  . COLONOSCOPY    . COLONOSCOPY WITH PROPOFOL N/A 07/28/2019   Procedure: COLONOSCOPY WITH PROPOFOL;  Surgeon: [name], MD;  Location: [facility];  Service: Endoscopy;  Laterality: N/A;  . CORONARY ARTERY BYPASS GRAFT  02-2005  . ICD GENERATOR CHANGEOUT N/A 02/03/2017   Procedure: ICD Generator Changeout;  Surgeon: [name], MD;  Location: [place] [facility];  Service: Cardiovascular;  Laterality: N/A;  . ORIF [place]   left   . SHOULDER ARTHROSCOPY  02/2010  rt   . TONSILLECTOMY     as a child     Current Outpatient Medications  Medication Sig Dispense Refill  . acetaminophen (TYLENOL) 325 MG tablet Take 650 mg by mouth every 6 (six) hours as needed (for pain.).    Marland Kitchen aspirin EC 81 MG tablet Take 81 mg by mouth at bedtime.    . candesartan (ATACAND) 4 MG tablet Take 1 tablet (4 mg total) by mouth daily. (Patient taking differently: Take 4 mg by mouth at bedtime. ) 90 tablet 3  . carboxymethylcellulose (REFRESH PLUS) 0.5 % SOLN Place 1 drop into both eyes daily.     . cetirizine (ZYRTEC) 10 MG tablet Take 10 mg by mouth daily.      . Colchicine (MITIGARE) 0.6 MG CAPS Take 0.6 mg by mouth 2 (two) times daily as needed (gout  flare). 60 capsule 0  . metoprolol succinate (TOPROL-XL) 25 MG 24 hr tablet Take 0.5 tablets (12.5 mg total) by mouth 2 (two) times daily. 90 tablet 3  . nitroGLYCERIN (NITROSTAT) 0.4 MG SL tablet Place 1 tablet (0.4 mg total) under the tongue every 5 (five) minutes as needed for chest pain (MAX 3 TABLETS). (Patient taking differently: Place 0.4 mg under the tongue every 5 (five) minutes x 3 doses as needed for chest pain (MAX 3 TABLETS). ) 25 tablet 6  . Omega-3 Fatty Acids (FISH OIL) 1200 MG CAPS Take 3,600 mg by mouth daily.    . rosuvastatin (CRESTOR) 40 MG tablet Take 1 tablet (40 mg total) by mouth at bedtime. 90 tablet 3   No current facility-administered medications for this encounter.     Allergies:   Atorvastatin, Sulfonamide derivatives, Tape, and Sulfamethoxazole   Social History:  The patient  reports that he quit smoking about 19 years ago. His smoking use included cigarettes. He smoked 0.10 packs per day. He has never used smokeless tobacco. He reports current alcohol use. He reports that he does not use drugs.   Family History:  The patient's family history includes Cardiomyopathy in his brother; Heart attack (age of onset: 81) in his father.   ROS:  Please see the history of present illness.   All other systems are personally reviewed and negative.   Exam:  (Video/Tele Health Call; Exam is subjective and or/visual.) General:  Speaks in full sentences. No resp difficulty. Lungs: Normal respiratory effort with conversation.  Abdomen: Non-distended per patient report Extremities: Pt denies edema. Neuro: Alert & oriented x 3.   Recent Labs: 06/09/2019: ALT 21; BUN 16; Creatinine, Ser 0.87; Hemoglobin 13.9; Platelets 166.0; Potassium 4.5; Sodium 137 08/09/2019: TSH 5.87  Personally reviewed   Wt Readings from Last 3 Encounters:  07/28/19 74.4 kg (164 lb)  07/21/19 78 kg (172 lb)  06/09/19 77.6 kg (171 lb 2 oz)      ASSESSMENT AND PLAN:  1. Acute on Chronic Systolic  Heart Failure ICM ECHO 08/2013 EF 45-50%  Has St Jude ICD 2009 -  Echo 07/2016 LVEF 30-35%, Mod MR, Mod LAE, Mildly dilated RV with mild reduced RV function, PA peak pressure 51 mm Hg.  - Much worse over the past week NYHA I->III - Volume status up 5 pounds. Somewhat improved with one dose lasix but major concern is for progressive angina. Will need R/L cath today to sort out HF vs Ischemia - Continue Toprol XL 12.5 mg BID.  - Continue Atacand 4 mg daily.  - Reinforced fluid restriction to < 2 L daily, sodium restriction to  less than 2000 mg daily, and the importance of daily weights.  2. Chest tightness with h/o CAD S/P CABG 2006 - Symptoms concerning for progressive angina versus worsening HF.  - have arranged R/L cath for today - Continue aspirin and simvastatin 3. Hyperlipidemia -Continue Crestor. Goal LDL < 70  - Per PCP 4. RBBB 5.  Shock for VF 06/09/16 - No further.  - Follows with [name].  - Stress test 08/14/2016 with EF 31%, old infarct, no ischemia.  6. Throat Cancer - on R side. Squamous Cell Carcinoma with unclear primary.  - s/p [facility] at [facility] in 2018   COVID screen The patient does not have any symptoms that suggest any further testing/ screening at this time.  Social distancing reinforced today.  Recommended follow-up:  As above  Relevant cardiac medications were reviewed at length with the patient today.   The patient does not have concerns regarding their medications at this time.   The following changes were made today:  Arrange cath for today.   Today, I have spent 16 minutes with the patient with telehealth technology discussing the above issues .    Signed, Glori Bickers, MD  09/20/2019 10:09 AM  Advanced Heart Failure [place] [address] [phone] (office) [phone] (fax)

## 2019-09-20 NOTE — Telephone Encounter (Signed)
Per Dr Haroldine Laws pt had CP last night and needs R/L HC TODAY.  Have called cath lab they will work pt in, they got approval from Short Stay as well, they will do rapid covid test when pt arrives.  Spoke w/pt, he is aware and agreeable, he has not had anything to eat this morning.  He will get dressed and come on to hospital.  Orders placed

## 2019-09-20 NOTE — H&P (View-Only) (Signed)
Heart Failure TeleHealth Note  Due to national recommendations of social distancing due to Matthew Joyce, Audio/video telehealth visit is felt to be most appropriate for this patient at this time.  See MyChart message from today for patient consent regarding telehealth for Matthew Joyce.  Date:  09/20/2019   ID:  Matthew Romans Sr., DOB [birth year], MRN [id number]  Location: Home  Provider location: [place] [facility] Type of Visit: Established patient  PCP:  Colon Branch, MD  Cardiologist:  No primary care provider on file. Primary HF: Bensimhon  Chief Complaint: Heart Failure follow-up   History of Present Illness:  Matthew Joyce is a 68 y.o. male with a history of a CAD S/P CABG in 2006 (LIMA -> LAD, SVG -> Ramus, SVG -> OM-2, SVG -> RCA), s/p ICD, ischemic MR, DM, hyperlipidemia, and throat cancer s/p chemo/XRT in 2018  Stress test 08/14/2016 with EF 31%, old infarct, no ischemia.   He presents via Engineer, [employer] (consulting) for a telehealth visit today.   He called me last night saying that over the past few days he has been having chest tightness, exertional dyspnea and orthopnea much like he had prior to his CABG. Weight was up 5 pounds. I prescribed a dose of lasix 40 mg (he was not on diuretics previously) and we set up a call for this am. He says breathing is much better but his chest is still tight and gets worse with activity. He denies fevers, chills or productive cough. No significant LE edema    Echo 07/2016 LVEF 30-35%, Mod MR, Mod LAE, Mildly dilated RV with mild reduced RV function, PA peak pressure 51 mm Hg.   [name] denies symptoms worrisome for COVID Joyce.   Past Medical History:  Diagnosis Date  . AICD (automatic cardioverter/defibrillator) present 6/09   St. Jude  . Bradycardia    Use of beta blocker limited  . CAD (coronary artery disease)    a- S/p cabg in march 2006 by Dr.Owen;  b. myoview 5/12: large IL scar from apex to base, no  ischemia, EF 37%  . Cancer (Matthew Joyce)    on right side of neck  . ED (erectile dysfunction)   . Gout   . Hyperlipidemia   . Ischemic cardiomyopathy    a-Echocardiogram, Nov 2006, showed ejection fraction of 30-40% with mild to moderated mitral  regurgitation and mild aortic regurgitation b- Cardiac MRI, May 2007, EF of 44% with 50% scar involving  the inferolateral walls. No comment of mitral regurgitation. c- last echo  11/10: EF 30-35%  mod MR d- Nega T-Wave alternans testing in May of 2007 e- no ACE due to low BP;  f. CPX 3/11: normal  . 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 Systolic CHF, chronic ([place])    Past Surgical History:  Procedure Laterality Date  . CARDIAC DEFIBRILLATOR PLACEMENT  06/01/08   ICD  . COLONOSCOPY    . COLONOSCOPY WITH PROPOFOL N/A 07/28/2019   Procedure: COLONOSCOPY WITH PROPOFOL;  Surgeon: [name], MD;  [facility];  Service: Endoscopy;  Laterality: N/A;  . CORONARY ARTERY BYPASS GRAFT  02-2005  . ICD GENERATOR CHANGEOUT N/A 02/03/2017   Procedure: ICD Generator Changeout;  Surgeon: [name], MD;  Location: [place] [facility];  Service: Cardiovascular;  Laterality: N/A;  . ORIF [place]   left   . SHOULDER ARTHROSCOPY  02/2010  rt   . TONSILLECTOMY     as a child     Current Outpatient Medications  Medication Sig Dispense Refill  . acetaminophen (TYLENOL) 325 MG tablet Take 650 mg by mouth every 6 (six) hours as needed (for pain.).    Marland Kitchen aspirin EC 81 MG tablet Take 81 mg by mouth at bedtime.    . candesartan (ATACAND) 4 MG tablet Take 1 tablet (4 mg total) by mouth daily. (Patient taking differently: Take 4 mg by mouth at bedtime. ) 90 tablet 3  . carboxymethylcellulose (REFRESH PLUS) 0.5 % SOLN Place 1 drop into both eyes daily.     . cetirizine (ZYRTEC) 10 MG tablet Take 10 mg by mouth daily.      . Colchicine (MITIGARE) 0.6 MG CAPS Take 0.6 mg by mouth 2 (two) times daily as needed (gout  flare). 60 capsule 0  . metoprolol succinate (TOPROL-XL) 25 MG 24 hr tablet Take 0.5 tablets (12.5 mg total) by mouth 2 (two) times daily. 90 tablet 3  . nitroGLYCERIN (NITROSTAT) 0.4 MG SL tablet Place 1 tablet (0.4 mg total) under the tongue every 5 (five) minutes as needed for chest pain (MAX 3 TABLETS). (Patient taking differently: Place 0.4 mg under the tongue every 5 (five) minutes x 3 doses as needed for chest pain (MAX 3 TABLETS). ) 25 tablet 6  . Omega-3 Fatty Acids (FISH OIL) 1200 MG CAPS Take 3,600 mg by mouth daily.    . rosuvastatin (CRESTOR) 40 MG tablet Take 1 tablet (40 mg total) by mouth at bedtime. 90 tablet 3   No current facility-administered medications for this encounter.     Allergies:   Atorvastatin, Sulfonamide derivatives, Tape, and Sulfamethoxazole   Social History:  The patient  reports that he quit smoking about Joyce years ago. His smoking use included cigarettes. He smoked 0.10 packs per day. He has never used smokeless tobacco. He reports current alcohol use. He reports that he does not use drugs.   Family History:  The patient's family history includes Cardiomyopathy in his brother; Heart attack (age of onset: 10) in his father.   ROS:  Please see the history of present illness.   All other systems are personally reviewed and negative.   Exam:  (Video/Tele Health Call; Exam is subjective and or/visual.) General:  Speaks in full sentences. No resp difficulty. Lungs: Normal respiratory effort with conversation.  Abdomen: Non-distended per patient report Extremities: Pt denies edema. Neuro: Alert & oriented x 3.   Recent Labs: 06/09/2019: ALT 21; BUN 16; Creatinine, Ser 0.87; Hemoglobin 13.9; Platelets 166.0; Potassium 4.5; Sodium 137 08/09/2019: TSH 5.87  Personally reviewed   Wt Readings from Last 3 Encounters:  07/28/19 74.4 kg (164 lb)  07/21/19 78 kg (172 lb)  06/09/19 77.6 kg (171 lb 2 oz)      ASSESSMENT AND PLAN:  1. Acute on Chronic Systolic  Heart Failure ICM ECHO 08/2013 EF 45-50%  Has St Jude ICD 2009 -  Echo 07/2016 LVEF 30-35%, Mod MR, Mod LAE, Mildly dilated RV with mild reduced RV function, PA peak pressure 51 mm Hg.  - Much worse over the past week NYHA I->III - Volume status up 5 pounds. Somewhat improved with one dose lasix but major concern is for progressive angina. Will need R/L cath today to sort out HF vs Ischemia - Continue Toprol XL 12.5 mg BID.  - Continue Atacand 4 mg daily.  - Reinforced fluid restriction to < 2 L daily, sodium restriction to  less than 2000 mg daily, and the importance of daily weights.  2. Chest tightness with h/o CAD S/P CABG 2006 - Symptoms concerning for progressive angina versus worsening HF.  - have arranged R/L cath for today - Continue aspirin and simvastatin 3. Hyperlipidemia -Continue Crestor. Goal LDL < 70  - Per PCP 4. RBBB 5.  Shock for VF 06/09/16 - No further.  - Follows with [name].  - Stress test 08/14/2016 with EF 31%, old infarct, no ischemia.  6. Throat Cancer - on R side. Squamous Cell Carcinoma with unclear primary.  - s/p chemo/XRT at [place] [facility] [place] in 2018   COVID screen The patient does not have any symptoms that suggest any further testing/ screening at this time.  Social distancing reinforced today.  Recommended follow-up:  As above  Relevant cardiac medications were reviewed at length with the patient today.   The patient does not have concerns regarding their medications at this time.   The following changes were made today:  Arrange cath for today.   Today, I have spent 16 minutes with the patient with telehealth technology discussing the above issues .    Signed, Glori Bickers, MD  09/20/2019 10:09 AM  Advanced Heart Failure [place] [address] [phone] (office) [phone] (fax)

## 2019-09-20 NOTE — Interval H&P Note (Signed)
History and Physical Interval Note:  09/20/2019 1:11 PM  Matthew Abbott D Ribaudo Sr.  has presented today for surgery, with the diagnosis of chest pain.  The various methods of treatment have been discussed with the patient and family. After consideration of risks, benefits and other options for treatment, the patient has consented to  Procedure(s): RIGHT/LEFT HEART CATH AND CORONARY/GRAFT ANGIOGRAPHY (N/A) and possible coronary angioplasty as a surgical intervention.  The patient's history has been reviewed, patient examined, no change in status, stable for surgery.  I have reviewed the patient's chart and labs.  Questions were answered to the patient's satisfaction.     [name]

## 2019-09-21 ENCOUNTER — Encounter (HOSPITAL_COMMUNITY): Payer: Self-pay | Admitting: Internal Medicine

## 2019-09-22 ENCOUNTER — Ambulatory Visit (HOSPITAL_COMMUNITY)
Admission: RE | Admit: 2019-09-22 | Discharge: 2019-09-22 | Disposition: A | Discharge status: Home / Self Care | Payer: Medicare HMO | Source: Ambulatory Visit | Attending: Internal Medicine | Admitting: Internal Medicine

## 2019-09-22 ENCOUNTER — Other Ambulatory Visit: Payer: Self-pay

## 2019-09-22 DIAGNOSIS — Z951 Presence of aortocoronary bypass graft: Secondary | ICD-10-CM | POA: Insufficient documentation

## 2019-09-22 DIAGNOSIS — I083 Combined rheumatic disorders of mitral, aortic and tricuspid valves: Secondary | ICD-10-CM | POA: Insufficient documentation

## 2019-09-22 DIAGNOSIS — Z95810 Presence of automatic (implantable) cardiac defibrillator: Secondary | ICD-10-CM | POA: Diagnosis not present

## 2019-09-22 DIAGNOSIS — I5022 Chronic systolic (congestive) heart failure: Secondary | ICD-10-CM | POA: Diagnosis not present

## 2019-09-22 DIAGNOSIS — I509 Heart failure, unspecified: Secondary | ICD-10-CM | POA: Diagnosis not present

## 2019-09-22 DIAGNOSIS — R9431 Abnormal electrocardiogram [ECG] [EKG]: Secondary | ICD-10-CM | POA: Insufficient documentation

## 2019-09-22 DIAGNOSIS — E785 Hyperlipidemia, unspecified: Secondary | ICD-10-CM | POA: Insufficient documentation

## 2019-09-22 DIAGNOSIS — I4510 Unspecified right bundle-branch block: Secondary | ICD-10-CM | POA: Diagnosis not present

## 2019-09-22 NOTE — Progress Notes (Signed)
  Echocardiogram 2D Echocardiogram has been performed.  Matthew Joyce 09/22/2019, 8:50 AM

## 2019-09-27 ENCOUNTER — Telehealth (HOSPITAL_COMMUNITY): Payer: Self-pay | Admitting: *Deleted

## 2019-09-27 ENCOUNTER — Telehealth (HOSPITAL_COMMUNITY): Payer: Self-pay

## 2019-09-27 ENCOUNTER — Other Ambulatory Visit (HOSPITAL_COMMUNITY): Payer: Medicare HMO

## 2019-09-27 NOTE — Telephone Encounter (Signed)
Pt called requesting echo results and plan moving forward. Dr.Bensimhon has not released results to clinic staff. Contacted his RN Kevan Rosebush and she will follow up with Dr.Bensimhon. Pt aware I am waiting for results from the doctor and will contact him shortly.

## 2019-09-27 NOTE — Telephone Encounter (Signed)
Received a Request for Medical Records from [facility]. Faxed records to [phone] on 09/26/2019.

## 2019-10-12 ENCOUNTER — Other Ambulatory Visit: Payer: Self-pay

## 2019-10-12 ENCOUNTER — Encounter (HOSPITAL_COMMUNITY): Payer: Self-pay | Admitting: Internal Medicine

## 2019-10-12 ENCOUNTER — Ambulatory Visit (HOSPITAL_COMMUNITY)
Admission: RE | Admit: 2019-10-12 | Discharge: 2019-10-12 | Disposition: A | Discharge status: Home / Self Care | Payer: Medicare HMO | Source: Ambulatory Visit | Attending: Internal Medicine | Admitting: Internal Medicine

## 2019-10-12 ENCOUNTER — Encounter (HOSPITAL_COMMUNITY): Payer: Medicare HMO | Admitting: Internal Medicine

## 2019-10-12 VITALS — BP 110/70 | HR 48 | Wt 172.6 lb

## 2019-10-12 DIAGNOSIS — I340 Nonrheumatic mitral (valve) insufficiency: Secondary | ICD-10-CM | POA: Diagnosis not present

## 2019-10-12 DIAGNOSIS — I2510 Atherosclerotic heart disease of native coronary artery without angina pectoris: Secondary | ICD-10-CM | POA: Diagnosis not present

## 2019-10-12 DIAGNOSIS — I5022 Chronic systolic (congestive) heart failure: Secondary | ICD-10-CM | POA: Diagnosis not present

## 2019-10-12 LAB — BASIC METABOLIC PANEL
Anion gap: 9 (ref 5–15)
BUN: 17 mg/dL (ref 8–23)
CO2: 25 mmol/L (ref 22–32)
Calcium: 9.5 mg/dL (ref 8.9–10.3)
Chloride: 101 mmol/L (ref 98–111)
Creatinine, Ser: 0.86 mg/dL (ref 0.61–1.24)
GFR calc Af Amer: >60 mL/min (ref >60)
GFR calc non Af Amer: >60 mL/min (ref >60)
Glucose, Bld: 108 mg/dL — ABNORMAL HIGH (ref 70–99)
Potassium: 3.8 mmol/L (ref 3.5–5.1)
Sodium: 135 mmol/L (ref 135–145)

## 2019-10-12 LAB — TYPE AND SCREEN
ABO/RH(D): O POS
Antibody Screen: NEGATIVE

## 2019-10-12 LAB — ABO/RH: ABO/RH(D): O POS

## 2019-10-12 LAB — BRAIN NATRIURETIC PEPTIDE: B Natriuretic Peptide: 633.7 pg/mL — ABNORMAL HIGH (ref 0.0–100.0)

## 2019-10-12 MED ORDER — SPIRONOLACTONE 25 MG PO TABS: 12.5000 mg | ORAL_TABLET | Freq: Every day | ORAL | 3 refills | Status: DC

## 2019-10-12 NOTE — Progress Notes (Signed)
Advanced Heart Failure Clinic Note   Date:  10/12/2019   ID:  Matthew Romans Sr., DOB 30-Jul-1951, MRN [id number]  Location: Home  Provider location: [facility] Type of Visit: Established patient  PCP:  Colon Branch, MD  Cardiologist:  No primary care provider on file. Primary HF: Matthew Joyce  Chief Complaint: Heart Failure follow-up   History of Present Illness:  [name] is a 68 y.o. male with a history of a CAD S/P CABG in 2006 (LIMA -> LAD, SVG -> Ramus, SVG -> OM-2, SVG -> RCA), s/p ICD, ischemic MR, DM, hyperlipidemia, and throat cancer s/p chemo/XRT in 2018  Stress test 08/14/2016 with EF 31%, old infarct, no ischemia.   Developed CP and SOB in early October. Cath on 09/20/19. Coronary anatomy was stable but EF down to 20% on cath with 3+ MR Echo on 09/20/19 read as EF 25-30% with mod MR. (I reviewed echo and EF 20-25% and moderate to severe MR). Says he just hasn't been feeling very well. SOB with mild activity, If misses lasix it gets much worse. SBP typically in low 90s. Gets lightheaded when he bends over to tie his shoes     1. 3v CAD  2. Patent LIMA to LAD 3. Patent free RIMA to ramus 4. Occluded SVG to RCA with widely patent native RCA 5. Unable to locate SVG to OM2 mentioned in OP report. There is no graft marker for this graft and Ar root shot did not show any SVG to OM (suspect may not have been placed) 6. EF 20% with global HK and 3+ MR Ao = 108/53 (72) LV = 109/19 RA = 9 RV = 60/10 PA = 63/23 (36) PCW = 22 (v = 35-40) Fick cardiac output/index = 4.5/2.4 PVR = 3.0 WU SVR 1310 Ao sat = 99% PA sat = 66%, 69%    Echo 07/2016 LVEF 30-35%, Mod MR, Mod LAE, Mildly dilated RV with mild reduced RV function, PA peak pressure 51 mm Hg.   Matthew D Montuori Sr. denies symptoms worrisome for COVID 19.   Past Medical History:  Diagnosis Date  . AICD (automatic cardioverter/defibrillator) present 6/09   St. Jude  . Bradycardia    Use  of beta blocker limited  . CAD (coronary artery disease)    a- S/p cabg in march 2006 by Dr.Owen;  b. myoview 5/12: large IL scar from apex to base, no ischemia, EF 37%  . Cancer ([place])    on right side of neck  . ED (erectile dysfunction)   . Gout   . Hyperlipidemia   . Ischemic cardiomyopathy    a-Echocardiogram, Nov 2006, showed ejection fraction of 30-40% with mild to moderated mitral  regurgitation and mild aortic regurgitation b- Cardiac MRI, May 2007, EF of 44% with 50% scar involving  the inferolateral walls. No comment of mitral regurgitation. c- last echo  11/10: EF 30-35%  mod MR d- Nega T-Wave alternans testing in May of 2007 e- no ACE due to low BP;  f. CPX 3/11: normal  . 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 Systolic CHF, chronic ([place])    Past Surgical History:  Procedure Laterality Date  . CARDIAC DEFIBRILLATOR PLACEMENT  06/01/08   ICD  . COLONOSCOPY    . COLONOSCOPY WITH PROPOFOL N/A 07/28/2019   Procedure: COLONOSCOPY WITH PROPOFOL;  Surgeon: [name], MD;  Location: [facility];  Service: Endoscopy;  Laterality: N/A;  . CORONARY ARTERY BYPASS GRAFT  02-2005  . ICD GENERATOR CHANGEOUT N/A 02/03/2017   Procedure: ICD Generator Changeout;  Surgeon: [name], MD;  Location: [facility];  Service: Cardiovascular;  Laterality: N/A;  . ORIF [place]   left   . RIGHT/LEFT HEART CATH AND CORONARY/GRAFT ANGIOGRAPHY N/A 09/20/2019   Procedure: RIGHT/LEFT HEART CATH AND CORONARY/GRAFT ANGIOGRAPHY;  Surgeon: [name], MD;  Location: [facility];  Service: Cardiovascular;  Laterality: N/A;  . SHOULDER ARTHROSCOPY  02/2010   rt   . TONSILLECTOMY     as a child     Current Outpatient Medications  Medication Sig Dispense Refill  . acetaminophen (TYLENOL) 325 MG tablet Take 650 mg by mouth every 6 (six) hours as needed (for pain.).    Marland Kitchen aspirin EC 81 MG tablet Take 81 mg by mouth at bedtime.    .  candesartan (ATACAND) 4 MG tablet Take 1 tablet (4 mg total) by mouth daily. (Patient taking differently: Take 4 mg by mouth at bedtime. ) 90 tablet 3  . carboxymethylcellulose (REFRESH PLUS) 0.5 % SOLN Place 1 drop into both eyes daily.     . cetirizine (ZYRTEC) 10 MG tablet Take 10 mg by mouth daily.      . Colchicine (MITIGARE) 0.6 MG CAPS Take 0.6 mg by mouth 2 (two) times daily as needed (gout flare). 60 capsule 0  . furosemide (LASIX) 40 MG tablet Take 40 mg by mouth daily.    . metoprolol succinate (TOPROL-XL) 25 MG 24 hr tablet Take 0.5 tablets (12.5 mg total) by mouth 2 (two) times daily. 90 tablet 3  . nitroGLYCERIN (NITROSTAT) 0.4 MG SL tablet Place 1 tablet (0.4 mg total) under the tongue every 5 (five) minutes as needed for chest pain (MAX 3 TABLETS). (Patient taking differently: Place 0.4 mg under the tongue every 5 (five) minutes x 3 doses as needed for chest pain (MAX 3 TABLETS). ) 25 tablet 6  . Omega-3 Fatty Acids (FISH OIL) 1200 MG CAPS Take 3,600 mg by mouth daily.    . potassium chloride (KLOR-CON) 20 MEQ packet Take by mouth daily.    . rosuvastatin (CRESTOR) 40 MG tablet Take 1 tablet (40 mg total) by mouth at bedtime. 90 tablet 3   No current facility-administered medications for this encounter.     Allergies:   Atorvastatin, Sulfonamide derivatives, Tape, and Sulfamethoxazole   Social History:  The patient  reports that he quit smoking about 19 years ago. His smoking use included cigarettes. He smoked 0.10 packs per day. He has never used smokeless tobacco. He reports current alcohol use. He reports that he does not use drugs.   Family History:  The patient's family history includes Cardiomyopathy in his brother; Heart attack (age of onset: 84) in his father.   ROS:  Please see the history of present illness.   All other systems are personally reviewed and negative.   Exam:   General:  Well appearing. No resp difficulty HEENT: normal Neck: supple. JVP 6-7. Carotids  2+ bilat; no bruits. No lymphadenopathy or thryomegaly appreciated. Cor: PMI laterally displaced. Regular rate & rhythm. 2/6 MR Lungs: clear Abdomen: soft, nontender, nondistended. No hepatosplenomegaly. No bruits or masses. Good bowel sounds. Extremities: no cyanosis, clubbing, rash, edema Neuro: alert & orientedx3, cranial nerves grossly intact. moves all 4 extremities w/o difficulty. Affect pleasant   Recent Labs: 06/09/2019: ALT 21 08/09/2019: TSH 5.87 09/20/2019: BUN 12; Creatinine,  Ser 0.98; Hemoglobin 12.6; Hemoglobin 13.3; Platelets 156; Potassium 3.5; Potassium 3.8; Sodium 142; Sodium 138  Personally reviewed   Wt Readings from Last 3 Encounters:  10/12/19 78.3 kg (172 lb 9.6 oz)  09/20/19 76.7 kg (169 lb)  07/28/19 74.4 kg (164 lb)      ASSESSMENT AND PLAN:  1. Acute on Chronic Systolic Heart Failure ICM ECHO 08/2013 EF 45-50%  Has St Jude ICD 2009 -  Echo 07/2016 LVEF 30-35%, Mod MR, Mod LAE, Mildly dilated RV with mild reduced RV function, PA peak pressure 51 mm Hg.  - Now NYHA III-IIIB and progressing fairly rapidly.  - Little BP room to titrate medical therapy aggressively. I suspect we are headed toward advanced therapies. Throat cancer in 2018 may be barrier to transplant  - Will plan CXP ASAP to further risk stratify. Will also schedule TEE to get better look at MV - Continue Toprol XL 12.5 mg BID.  - Continue Atacand 4 mg daily.  - Continue lasix - Reinforced fluid restriction to < 2 L daily, sodium restriction to less than 2000 mg daily, and the importance of daily weights.  - Blood Type O positive 2. Chest tightness with h/o CAD S/P CABG 2006 - Recent coronary angio with stable revascularization. - Continue aspirin and simvastatin 3. Hyperlipidemia -Continue Crestor. Goal LDL < 70  - Per PCP 4. RBBB 5.  Shock for VF 06/09/16 - No further.  - Follows with [name].  6. Throat Cancer - on R side. Squamous Cell Carcinoma with unclear primary.  - s/p chemo/XRT  at [facility] in 2018  Total time spent 45 minutes. Over half that time spent discussing above.   Signed, Glori Bickers, MD  10/12/2019 2:32 PM  Advanced Heart Failure [facility] [address] Heart and [place] [address] [phone] (office) [phone] (fax)

## 2019-10-12 NOTE — H&P (View-Only) (Signed)
Advanced Heart Failure Clinic Note   Date:  10/12/2019   ID:  Matthew Romans Sr., DOB [birth year], MRN [id number]  Location: Home  Provider location: [facility] Type of Visit: Established patient  PCP:  Colon Branch, MD  Cardiologist:  No primary care provider on file. Primary HF:   Chief Complaint: Heart Failure follow-up   History of Present Illness:  Matthew Joyce is a 68 y.o. male with a history of a CAD S/P CABG in 2006 (LIMA -> LAD, SVG -> Ramus, SVG -> OM-2, SVG -> RCA), s/p ICD, ischemic MR, DM, hyperlipidemia, and throat cancer s/p chemo/XRT in 2018  Stress test 08/14/2016 with EF 31%, old infarct, no ischemia.   Developed CP and SOB in early October. Cath on 09/20/19. Coronary anatomy was stable but EF down to 20% on cath with 3+ MR Echo on 09/20/19 read as EF 25-30% with mod MR. (I reviewed echo and EF 20-25% and moderate to severe MR). Says he just hasn't been feeling very well. SOB with mild activity, If misses lasix it gets much worse. SBP typically in low 90s. Gets lightheaded when he bends over to tie his shoes     1. 3v CAD  2. Patent LIMA to LAD 3. Patent free RIMA to ramus 4. Occluded SVG to RCA with widely patent native RCA 5. Unable to locate SVG to OM2 mentioned in OP report. There is no graft marker for this graft and Ar root shot did not show any SVG to OM (suspect may not have been placed) 6. EF 20% with global HK and 3+ MR Ao = 108/53 (72) LV = 109/19 RA = 9 RV = 60/10 PA = 63/23 (36) PCW = 22 (v = 35-40) Fick cardiac output/index = 4.5/2.4 PVR = 3.0 WU SVR 1310 Ao sat = 99% PA sat = 66%, 69%    Echo 07/2016 LVEF 30-35%, Mod MR, Mod LAE, Mildly dilated RV with mild reduced RV function, PA peak pressure 51 mm Hg.   Matthew D Nhem Sr. denies symptoms worrisome for COVID 19.   Past Medical History:  Diagnosis Date  . AICD (automatic cardioverter/defibrillator) present 6/09   St. Jude  . Bradycardia    Use  of beta blocker limited  . CAD (coronary artery disease)    a- S/p cabg in march 2006 by Dr.Owen;  b. myoview 5/12: large IL scar from apex to base, no ischemia, EF 37%  . Cancer ([place])    on right side of neck  . ED (erectile dysfunction)   . Gout   . Hyperlipidemia   . Ischemic cardiomyopathy    a-Echocardiogram, Nov 2006, showed ejection fraction of 30-40% with mild to moderated mitral  regurgitation and mild aortic regurgitation b- Cardiac MRI, May 2007, EF of 44% with 50% scar involving  the inferolateral walls. No comment of mitral regurgitation. c- last echo  11/10: EF 30-35%  mod MR d- Nega T-Wave alternans testing in May of 2007 e- no ACE due to low BP;  f. CPX 3/11: normal  . 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 Systolic CHF, chronic ([place])    Past Surgical History:  Procedure Laterality Date  . CARDIAC DEFIBRILLATOR PLACEMENT  06/01/08   ICD  . COLONOSCOPY    . COLONOSCOPY WITH PROPOFOL N/A 07/28/2019   Procedure: COLONOSCOPY WITH PROPOFOL;  Surgeon: [name], MD;  [facility];  Service: Endoscopy;  Laterality: N/A;  . CORONARY ARTERY BYPASS GRAFT  02-2005  . ICD GENERATOR CHANGEOUT N/A 02/03/2017   Procedure: ICD Generator Changeout;  Surgeon: [name], MD;  Location: [facility];  Service: Cardiovascular;  Laterality: N/A;  . ORIF [place]   left   . RIGHT/LEFT HEART CATH AND CORONARY/GRAFT ANGIOGRAPHY N/A 09/20/2019   Procedure: RIGHT/LEFT HEART CATH AND CORONARY/GRAFT ANGIOGRAPHY;  Surgeon: [name], MD;  Location: [facility];  Service: Cardiovascular;  Laterality: N/A;  . SHOULDER ARTHROSCOPY  02/2010   rt   . TONSILLECTOMY     as a child     Current Outpatient Medications  Medication Sig Dispense Refill  . acetaminophen (TYLENOL) 325 MG tablet Take 650 mg by mouth every 6 (six) hours as needed (for pain.).    Marland Kitchen aspirin EC 81 MG tablet Take 81 mg by mouth at bedtime.    .  candesartan (ATACAND) 4 MG tablet Take 1 tablet (4 mg total) by mouth daily. (Patient taking differently: Take 4 mg by mouth at bedtime. ) 90 tablet 3  . carboxymethylcellulose (REFRESH PLUS) 0.5 % SOLN Place 1 drop into both eyes daily.     . cetirizine (ZYRTEC) 10 MG tablet Take 10 mg by mouth daily.      . Colchicine (MITIGARE) 0.6 MG CAPS Take 0.6 mg by mouth 2 (two) times daily as needed (gout flare). 60 capsule 0  . furosemide (LASIX) 40 MG tablet Take 40 mg by mouth daily.    . metoprolol succinate (TOPROL-XL) 25 MG 24 hr tablet Take 0.5 tablets (12.5 mg total) by mouth 2 (two) times daily. 90 tablet 3  . nitroGLYCERIN (NITROSTAT) 0.4 MG SL tablet Place 1 tablet (0.4 mg total) under the tongue every 5 (five) minutes as needed for chest pain (MAX 3 TABLETS). (Patient taking differently: Place 0.4 mg under the tongue every 5 (five) minutes x 3 doses as needed for chest pain (MAX 3 TABLETS). ) 25 tablet 6  . Omega-3 Fatty Acids (FISH OIL) 1200 MG CAPS Take 3,600 mg by mouth daily.    . potassium chloride (KLOR-CON) 20 MEQ packet Take by mouth daily.    . rosuvastatin (CRESTOR) 40 MG tablet Take 1 tablet (40 mg total) by mouth at bedtime. 90 tablet 3   No current facility-administered medications for this encounter.     Allergies:   Atorvastatin, Sulfonamide derivatives, Tape, and Sulfamethoxazole   Social History:  The patient  reports that he quit smoking about 19 years ago. His smoking use included cigarettes. He smoked 0.10 packs per day. He has never used smokeless tobacco. He reports current alcohol use. He reports that he does not use drugs.   Family History:  The patient's family history includes Cardiomyopathy in his brother; Heart attack (age of onset: 36) in his father.   ROS:  Please see the history of present illness.   All other systems are personally reviewed and negative.   Exam:   General:  Well appearing. No resp difficulty HEENT: normal Neck: supple. JVP 6-7. Carotids  2+ bilat; no bruits. No lymphadenopathy or thryomegaly appreciated. Cor: PMI laterally displaced. Regular rate & rhythm. 2/6 MR Lungs: clear Abdomen: soft, nontender, nondistended. No hepatosplenomegaly. No bruits or masses. Good bowel sounds. Extremities: no cyanosis, clubbing, rash, edema Neuro: alert & orientedx3, cranial nerves grossly intact. moves all 4 extremities w/o difficulty. Affect pleasant   Recent Labs: 06/09/2019: ALT 21 08/09/2019: TSH 5.87 09/20/2019: BUN 12; Creatinine,  Ser 0.98; Hemoglobin 12.6; Hemoglobin 13.3; Platelets 156; Potassium 3.5; Potassium 3.8; Sodium 142; Sodium 138  Personally reviewed   Wt Readings from Last 3 Encounters:  10/12/19 78.3 kg (172 lb 9.6 oz)  09/20/19 76.7 kg (169 lb)  07/28/19 74.4 kg (164 lb)      ASSESSMENT AND PLAN:  1. Acute on Chronic Systolic Heart Failure ICM ECHO 08/2013 EF 45-50%  Has St Jude ICD 2009 -  Echo 07/2016 LVEF 30-35%, Mod MR, Mod LAE, Mildly dilated RV with mild reduced RV function, PA peak pressure 51 mm Hg.  - Now NYHA III-IIIB and progressing fairly rapidly.  - Little BP room to titrate medical therapy aggressively. I suspect we are headed toward advanced therapies. Throat cancer in 2018 may be barrier to transplant  - Will plan CXP ASAP to further risk stratify. Will also schedule TEE to get better look at MV - Continue Toprol XL 12.5 mg BID.  - Continue Atacand 4 mg daily.  - Continue lasix - Reinforced fluid restriction to < 2 L daily, sodium restriction to less than 2000 mg daily, and the importance of daily weights.  - Blood Type O positive 2. Chest tightness with h/o CAD S/P CABG 2006 - Recent coronary angio with stable revascularization. - Continue aspirin and simvastatin 3. Hyperlipidemia -Continue Crestor. Goal LDL < 70  - Per PCP 4. RBBB 5.  Shock for VF 06/09/16 - No further.  - Follows with [name].  6. Throat Cancer - on R side. Squamous Cell Carcinoma with unclear primary.  - s/p chemo/XRT  at [facility] in 2018  Total time spent 45 minutes. Over half that time spent discussing above.   Signed, Glori Bickers, MD  10/12/2019 2:32 PM  Advanced Heart Failure [facility] [address] Heart and [place] [address] [phone] (office) [phone] (fax)

## 2019-10-12 NOTE — Patient Instructions (Signed)
START Spironolactone 12.5 mg, one half tab daily STOP Potassium  Labs today We will only contact you if something comes back abnormal or we need to make some changes. Otherwise no news is good news!  Labs needed in one week  Your physician has recommended that you have a cardiopulmonary stress test (CPX). CPX testing is a non-invasive measurement of heart and lung function. It replaces a traditional treadmill stress test. This type of test provides a tremendous amount of information that relates not only to your present condition but also for future outcomes. This test combines measurements of you ventilation, respiratory gas exchange in the lungs, electrocardiogram (EKG), blood pressure and physical response before, during, and following an exercise protocol.  Your physician has requested that you have a TEE. During a TEE, sound waves are used to create images of your heart. It provides your doctor with information about the size and shape of your heart and how well your heart's chambers and valves are working. In this test, a transducer is attached to the end of a flexible tube that's guided down your throat and into your esophagus (the tube leading from you mouth to your stomach) to get a more detailed image of your heart. You are not awake for the procedure. Please see the instruction sheet given to you today. For further information please visit [email or web address].   Your physician recommends that you schedule a follow-up appointment in: 1 month with Dr Haroldine Laws  At the [facility], you and your health needs are our priority. As part of our continuing mission to provide you with exceptional heart care, we have created designated Provider Care Teams. These Care Teams include your primary Cardiologist (physician) and Advanced Practice Providers (APPs- Physician Assistants and Nurse Practitioners) who all work together to provide you with the care you need, when you need it.    You may see any of the following providers on your designated Care Team at your next follow up: Marland Kitchen Dr Glori Bickers . Dr Loralie Champagne . [name], NP . Lyda Jester, PA   Please be sure to bring in all your medications bottles to every appointment.

## 2019-10-17 ENCOUNTER — Other Ambulatory Visit (HOSPITAL_COMMUNITY): Payer: Self-pay | Admitting: Internal Medicine

## 2019-10-17 ENCOUNTER — Other Ambulatory Visit (HOSPITAL_COMMUNITY)
Admission: RE | Admit: 2019-10-17 | Discharge: 2019-10-17 | Disposition: A | Discharge status: Home / Self Care | Payer: Medicare HMO | Source: Ambulatory Visit | Attending: Internal Medicine | Admitting: Internal Medicine

## 2019-10-17 ENCOUNTER — Other Ambulatory Visit: Payer: Self-pay | Admitting: Internal Medicine

## 2019-10-17 DIAGNOSIS — R69 Illness, unspecified: Secondary | ICD-10-CM | POA: Diagnosis not present

## 2019-10-17 DIAGNOSIS — Z01812 Encounter for preprocedural laboratory examination: Secondary | ICD-10-CM | POA: Insufficient documentation

## 2019-10-17 DIAGNOSIS — Z20828 Contact with and (suspected) exposure to other viral communicable diseases: Secondary | ICD-10-CM | POA: Diagnosis not present

## 2019-10-18 LAB — NOVEL CORONAVIRUS, NAA (HOSP ORDER, SEND-OUT TO REF LAB; TAT 18-24 HRS): SARS-CoV-2, NAA: NOT DETECTED

## 2019-10-19 DIAGNOSIS — R69 Illness, unspecified: Secondary | ICD-10-CM | POA: Diagnosis not present

## 2019-10-20 ENCOUNTER — Ambulatory Visit (HOSPITAL_COMMUNITY)
Admission: RE | Admit: 2019-10-20 | Discharge: 2019-10-20 | Disposition: A | Discharge status: Home / Self Care | Payer: Medicare HMO | Source: Ambulatory Visit | Attending: Internal Medicine | Admitting: Internal Medicine

## 2019-10-20 ENCOUNTER — Ambulatory Visit (HOSPITAL_COMMUNITY): Payer: Medicare HMO

## 2019-10-20 ENCOUNTER — Other Ambulatory Visit (HOSPITAL_COMMUNITY)
Admission: RE | Admit: 2019-10-20 | Discharge: 2019-10-20 | Disposition: A | Discharge status: Home / Self Care | Payer: Medicare HMO | Source: Ambulatory Visit | Attending: Internal Medicine | Admitting: Internal Medicine

## 2019-10-20 ENCOUNTER — Other Ambulatory Visit: Payer: Self-pay

## 2019-10-20 DIAGNOSIS — Z01812 Encounter for preprocedural laboratory examination: Secondary | ICD-10-CM | POA: Insufficient documentation

## 2019-10-20 DIAGNOSIS — I5022 Chronic systolic (congestive) heart failure: Secondary | ICD-10-CM | POA: Diagnosis not present

## 2019-10-20 DIAGNOSIS — Z20828 Contact with and (suspected) exposure to other viral communicable diseases: Secondary | ICD-10-CM | POA: Diagnosis not present

## 2019-10-20 DIAGNOSIS — I081 Rheumatic disorders of both mitral and tricuspid valves: Secondary | ICD-10-CM | POA: Insufficient documentation

## 2019-10-20 LAB — CBC
HCT: 44.7 % (ref 39.0–52.0)
Hemoglobin: 14.3 g/dL (ref 13.0–17.0)
MCH: 29.5 pg (ref 26.0–34.0)
MCHC: 32 g/dL (ref 30.0–36.0)
MCV: 92.2 fL (ref 80.0–100.0)
Platelets: 159 10*3/uL (ref 150–400)
RBC: 4.85 MIL/uL (ref 4.22–5.81)
RDW: 13.2 % (ref 11.5–15.5)
WBC: 5.9 10*3/uL (ref 4.0–10.5)
nRBC: 0 % (ref 0.0–0.2)

## 2019-10-20 LAB — BASIC METABOLIC PANEL
Anion gap: 11 (ref 5–15)
BUN: 12 mg/dL (ref 8–23)
CO2: 22 mmol/L (ref 22–32)
Calcium: 9.5 mg/dL (ref 8.9–10.3)
Chloride: 103 mmol/L (ref 98–111)
Creatinine, Ser: 0.93 mg/dL (ref 0.61–1.24)
GFR calc Af Amer: >60 mL/min (ref >60)
GFR calc non Af Amer: >60 mL/min (ref >60)
Glucose, Bld: 126 mg/dL — ABNORMAL HIGH (ref 70–99)
Potassium: 4.1 mmol/L (ref 3.5–5.1)
Sodium: 136 mmol/L (ref 135–145)

## 2019-10-20 LAB — PROTIME-INR
INR: 1.1 (ref 0.8–1.2)
Prothrombin Time: 13.9 seconds (ref 11.4–15.2)

## 2019-10-21 ENCOUNTER — Other Ambulatory Visit (HOSPITAL_COMMUNITY): Payer: Self-pay | Admitting: *Deleted

## 2019-10-21 ENCOUNTER — Other Ambulatory Visit (HOSPITAL_COMMUNITY): Payer: Medicare HMO

## 2019-10-21 DIAGNOSIS — I340 Nonrheumatic mitral (valve) insufficiency: Secondary | ICD-10-CM

## 2019-10-21 LAB — NOVEL CORONAVIRUS, NAA (HOSP ORDER, SEND-OUT TO REF LAB; TAT 18-24 HRS): SARS-CoV-2, NAA: NOT DETECTED

## 2019-10-24 ENCOUNTER — Encounter (HOSPITAL_COMMUNITY)
Admission: RE | Disposition: A | Discharge status: Home / Self Care | Payer: Self-pay | Source: Home / Self Care | Attending: Internal Medicine

## 2019-10-24 ENCOUNTER — Other Ambulatory Visit: Payer: Self-pay

## 2019-10-24 ENCOUNTER — Ambulatory Visit (HOSPITAL_COMMUNITY)
Admission: RE | Admit: 2019-10-24 | Discharge: 2019-10-24 | Disposition: A | Discharge status: Home / Self Care | Payer: Medicare HMO | Attending: Internal Medicine | Admitting: Internal Medicine

## 2019-10-24 ENCOUNTER — Ambulatory Visit (HOSPITAL_BASED_OUTPATIENT_CLINIC_OR_DEPARTMENT_OTHER): Payer: Medicare HMO

## 2019-10-24 ENCOUNTER — Encounter (HOSPITAL_COMMUNITY): Payer: Self-pay

## 2019-10-24 DIAGNOSIS — M109 Gout, unspecified: Secondary | ICD-10-CM | POA: Diagnosis not present

## 2019-10-24 DIAGNOSIS — I340 Nonrheumatic mitral (valve) insufficiency: Secondary | ICD-10-CM

## 2019-10-24 DIAGNOSIS — Z87891 Personal history of nicotine dependence: Secondary | ICD-10-CM | POA: Diagnosis not present

## 2019-10-24 DIAGNOSIS — Z85819 Personal history of malignant neoplasm of unspecified site of lip, oral cavity, and pharynx: Secondary | ICD-10-CM | POA: Insufficient documentation

## 2019-10-24 DIAGNOSIS — I2510 Atherosclerotic heart disease of native coronary artery without angina pectoris: Secondary | ICD-10-CM | POA: Diagnosis not present

## 2019-10-24 DIAGNOSIS — Z79899 Other long term (current) drug therapy: Secondary | ICD-10-CM | POA: Diagnosis not present

## 2019-10-24 DIAGNOSIS — Z951 Presence of aortocoronary bypass graft: Secondary | ICD-10-CM | POA: Diagnosis not present

## 2019-10-24 DIAGNOSIS — E785 Hyperlipidemia, unspecified: Secondary | ICD-10-CM | POA: Insufficient documentation

## 2019-10-24 DIAGNOSIS — Z7982 Long term (current) use of aspirin: Secondary | ICD-10-CM | POA: Diagnosis not present

## 2019-10-24 DIAGNOSIS — I4510 Unspecified right bundle-branch block: Secondary | ICD-10-CM | POA: Insufficient documentation

## 2019-10-24 DIAGNOSIS — I5023 Acute on chronic systolic (congestive) heart failure: Secondary | ICD-10-CM | POA: Diagnosis not present

## 2019-10-24 DIAGNOSIS — Z95810 Presence of automatic (implantable) cardiac defibrillator: Secondary | ICD-10-CM | POA: Diagnosis not present

## 2019-10-24 DIAGNOSIS — Z888 Allergy status to other drugs, medicaments and biological substances status: Secondary | ICD-10-CM | POA: Insufficient documentation

## 2019-10-24 DIAGNOSIS — I255 Ischemic cardiomyopathy: Secondary | ICD-10-CM | POA: Diagnosis not present

## 2019-10-24 DIAGNOSIS — Z882 Allergy status to sulfonamides status: Secondary | ICD-10-CM | POA: Diagnosis not present

## 2019-10-24 DIAGNOSIS — I081 Rheumatic disorders of both mitral and tricuspid valves: Secondary | ICD-10-CM | POA: Insufficient documentation

## 2019-10-24 HISTORY — PX: TEE WITHOUT CARDIOVERSION: SHX5443

## 2019-10-24 SURGERY — ECHOCARDIOGRAM, TRANSESOPHAGEAL
Anesthesia: Moderate Sedation

## 2019-10-24 MED ORDER — MIDAZOLAM HCL (PF) 10 MG/2ML IJ SOLN
INTRAMUSCULAR | Status: DC | PRN
  Administered 2019-10-24 (×2): 2 mg via INTRAVENOUS

## 2019-10-24 MED ORDER — FENTANYL CITRATE (PF) 100 MCG/2ML IJ SOLN
INTRAMUSCULAR | Status: AC
  Filled 2019-10-24: qty 2

## 2019-10-24 MED ORDER — SODIUM CHLORIDE 0.9 % IV SOLN: INTRAVENOUS | Status: DC

## 2019-10-24 MED ORDER — FENTANYL CITRATE (PF) 100 MCG/2ML IJ SOLN
INTRAMUSCULAR | Status: DC | PRN
  Administered 2019-10-24: 50 ug via INTRAVENOUS
  Administered 2019-10-24: 25 ug via INTRAVENOUS

## 2019-10-24 MED ORDER — MIDAZOLAM HCL (PF) 5 MG/ML IJ SOLN
INTRAMUSCULAR | Status: AC
  Filled 2019-10-24: qty 2

## 2019-10-24 MED ORDER — BUTAMBEN-TETRACAINE-BENZOCAINE 2-2-14 % EX AERO
INHALATION_SPRAY | CUTANEOUS | Status: DC | PRN
  Administered 2019-10-24: 2 via TOPICAL

## 2019-10-24 MED ORDER — LACTATED RINGERS IV SOLN
INTRAVENOUS | Status: DC
  Administered 2019-10-24: 09:00:00 via INTRAVENOUS

## 2019-10-24 NOTE — Interval H&P Note (Signed)
History and Physical Interval Note:  10/24/2019 9:05 AM  Matthew Romans Sr.  has presented today for surgery, with the diagnosis of mitral regurgitation.  The various methods of treatment have been discussed with the patient and family. After consideration of risks, benefits and other options for treatment, the patient has consented to  Procedure(s): TRANSESOPHAGEAL ECHOCARDIOGRAM (TEE) (N/A) as a surgical intervention.  The patient's history has been reviewed, patient examined, no change in status, stable for surgery.  I have reviewed the patient's chart and labs.  Questions were answered to the patient's satisfaction.     Darshan Solanki

## 2019-10-24 NOTE — Progress Notes (Signed)
Echocardiogram Echocardiogram Transesophageal has been performed.  Oneal Deputy Tameca Jerez 10/24/2019, 10:05 AM

## 2019-10-24 NOTE — CV Procedure (Signed)
    TRANSESOPHAGEAL ECHOCARDIOGRAM   NAME:  Matthew Jaeger Hsiao Sr.   MRN: [id number] DOB:  10/21/1951   ADMIT DATE: 10/24/2019  INDICATIONS:  Mitral regurgitation  PROCEDURE:   Informed consent was obtained prior to the procedure. The risks, benefits and alternatives for the procedure were discussed and the patient comprehended these risks.  Risks include, but are not limited to, cough, sore throat, vomiting, nausea, somnolence, esophageal and stomach trauma or perforation, bleeding, low blood pressure, aspiration, pneumonia, infection, trauma to the teeth and death.    After a procedural time-out, the patient was given 4 mg versed and 75 mcg fentanyl for moderate sedation.  The oropharynx was anesthetized with cetacaine spray.  The transesophageal probe was inserted in the esophagus and stomach without difficulty and multiple views were obtained.    COMPLICATIONS:    There were no immediate complications.  FINDINGS:  LEFT VENTRICLE: EF = 20-25%. Global HK  RIGHT VENTRICLE: Normal size. Moderate to severe HK. + ICD wire  LEFT ATRIUM: Moderately to severely dialted  LEFT ATRIAL APPENDAGE: No thrombus.   RIGHT ATRIUM: Dllated  AORTIC VALVE:  Trileaflet. No AI/AS  MITRAL VALVE:    Normal. Mildly restricted posterior leaflet. Moderate MR (3+). Minimal systolic flow reversal in pulmonary veins  TRICUSPID VALVE: Normal. Severe TR  PULMONIC VALVE: Grossly normal. Trivial PR  INTERATRIAL SEPTUM: No PFO or ASD.  PERICARDIUM: No effusion  DESCENDING AORTA: Mild plaque    Benay Spice 9:43 AM

## 2019-10-24 NOTE — Discharge Instructions (Signed)
Transesophageal Echocardiogram °Transesophageal echocardiogram (TEE) is a test that uses sound waves to take pictures of your heart. TEE is done by passing a flexible tube down the esophagus. The esophagus is the tube that carries food from the throat to the stomach. The pictures give detailed images of your heart. This can help your doctor see if there are problems with your heart. °What happens during the procedure? °· To lower your risk of infection, your doctors will wash or clean their hands. °· An IV will be put into one of your veins. °· You will be given a medicine to help you relax (sedative). °· A medicine may be sprayed or gargled. This numbs the back of your throat. °· Your blood pressure, heart rate, and breathing will be watched. °· You may be asked to lay on your left side. °· A bite block will be placed in your mouth. This keeps you from biting the tube. °· The tip of the TEE probe will be placed into the back of your mouth. °· You will be asked to swallow. °· Your doctor will take pictures of your heart. °· The probe and bite block will be taken out. °The procedure may vary among doctors and hospitals. °What happens after the procedure? ° °· Your blood pressure, heart rate, breathing rate, and blood oxygen level will be watched until the medicines you were given have worn off. °· When you first wake up, your throat may feel sore and numb. This will get better over time. You will not be allowed to eat or drink until the numbness has gone away. °· Do not drive for 24 hours if you were given a medicine to help you relax. °Summary °· TEE is a test that uses sound waves to take pictures of your heart. °· You will be given a medicine to help you relax. °· Do not drive for 24 hours if you were given a medicine to help you relax. °This information is not intended to replace advice given to you by your health care provider. Make sure you discuss any questions you have with your health care  provider. °Document Released: 09/28/2009 Document Revised: 08/20/2018 Document Reviewed: 03/04/2017 °Elsevier Patient Education © 2020 [employer] ° °

## 2019-10-25 ENCOUNTER — Encounter (HOSPITAL_COMMUNITY): Payer: Self-pay | Admitting: Internal Medicine

## 2019-11-01 ENCOUNTER — Ambulatory Visit (INDEPENDENT_AMBULATORY_CARE_PROVIDER_SITE_OTHER): Payer: Medicare HMO | Admitting: *Deleted

## 2019-11-01 DIAGNOSIS — I4510 Unspecified right bundle-branch block: Secondary | ICD-10-CM

## 2019-11-01 DIAGNOSIS — I5022 Chronic systolic (congestive) heart failure: Secondary | ICD-10-CM

## 2019-11-02 LAB — CUP PACEART REMOTE DEVICE CHECK
Battery Remaining Longevity: 80 mo
Battery Remaining Percentage: 77 %
Battery Voltage: 2.98 V
Brady Statistic RV Percent Paced: 1.9 %
Date Time Interrogation Session: 20201118041110
HighPow Impedance: 54 Ohm
HighPow Impedance: 55 Ohm
Implantable Lead Implant Date: 20090618
Implantable Lead Location: 753860
Implantable Lead Model: 7121
Implantable Pulse Generator Implant Date: 20180220
Lead Channel Impedance Value: 480 Ohm
Lead Channel Pacing Threshold Amplitude: 1 V
Lead Channel Pacing Threshold Pulse Width: 0.8 ms
Lead Channel Sensing Intrinsic Amplitude: 5.3 mV
Lead Channel Setting Pacing Amplitude: 2.5 V
Lead Channel Setting Pacing Pulse Width: 0.8 ms
Lead Channel Setting Sensing Sensitivity: 0.5 mV
Pulse Gen Serial Number: 7406967

## 2019-11-08 DIAGNOSIS — Z0100 Encounter for examination of eyes and vision without abnormal findings: Secondary | ICD-10-CM | POA: Diagnosis not present

## 2019-11-08 DIAGNOSIS — H524 Presbyopia: Secondary | ICD-10-CM | POA: Diagnosis not present

## 2019-11-16 ENCOUNTER — Ambulatory Visit (HOSPITAL_COMMUNITY)
Admission: RE | Admit: 2019-11-16 | Discharge: 2019-11-16 | Disposition: A | Discharge status: Home / Self Care | Payer: Medicare HMO | Source: Ambulatory Visit | Attending: Internal Medicine | Admitting: Internal Medicine

## 2019-11-16 ENCOUNTER — Other Ambulatory Visit: Payer: Self-pay

## 2019-11-16 ENCOUNTER — Encounter (HOSPITAL_COMMUNITY): Payer: Self-pay | Admitting: Internal Medicine

## 2019-11-16 VITALS — BP 112/70 | HR 47 | Wt 173.4 lb

## 2019-11-16 DIAGNOSIS — Z79899 Other long term (current) drug therapy: Secondary | ICD-10-CM | POA: Diagnosis not present

## 2019-11-16 DIAGNOSIS — I5022 Chronic systolic (congestive) heart failure: Secondary | ICD-10-CM | POA: Diagnosis not present

## 2019-11-16 DIAGNOSIS — I2510 Atherosclerotic heart disease of native coronary artery without angina pectoris: Secondary | ICD-10-CM | POA: Insufficient documentation

## 2019-11-16 DIAGNOSIS — N529 Male erectile dysfunction, unspecified: Secondary | ICD-10-CM | POA: Insufficient documentation

## 2019-11-16 DIAGNOSIS — Z9221 Personal history of antineoplastic chemotherapy: Secondary | ICD-10-CM | POA: Diagnosis not present

## 2019-11-16 DIAGNOSIS — I081 Rheumatic disorders of both mitral and tricuspid valves: Secondary | ICD-10-CM | POA: Diagnosis not present

## 2019-11-16 DIAGNOSIS — Z87891 Personal history of nicotine dependence: Secondary | ICD-10-CM | POA: Diagnosis not present

## 2019-11-16 DIAGNOSIS — I452 Bifascicular block: Secondary | ICD-10-CM | POA: Diagnosis not present

## 2019-11-16 DIAGNOSIS — I5023 Acute on chronic systolic (congestive) heart failure: Secondary | ICD-10-CM | POA: Diagnosis not present

## 2019-11-16 DIAGNOSIS — Z923 Personal history of irradiation: Secondary | ICD-10-CM | POA: Insufficient documentation

## 2019-11-16 DIAGNOSIS — I255 Ischemic cardiomyopathy: Secondary | ICD-10-CM | POA: Diagnosis not present

## 2019-11-16 DIAGNOSIS — Z85819 Personal history of malignant neoplasm of unspecified site of lip, oral cavity, and pharynx: Secondary | ICD-10-CM | POA: Insufficient documentation

## 2019-11-16 DIAGNOSIS — Z8249 Family history of ischemic heart disease and other diseases of the circulatory system: Secondary | ICD-10-CM | POA: Insufficient documentation

## 2019-11-16 DIAGNOSIS — E119 Type 2 diabetes mellitus without complications: Secondary | ICD-10-CM | POA: Diagnosis not present

## 2019-11-16 DIAGNOSIS — E785 Hyperlipidemia, unspecified: Secondary | ICD-10-CM | POA: Insufficient documentation

## 2019-11-16 DIAGNOSIS — Z95810 Presence of automatic (implantable) cardiac defibrillator: Secondary | ICD-10-CM | POA: Diagnosis not present

## 2019-11-16 DIAGNOSIS — Z951 Presence of aortocoronary bypass graft: Secondary | ICD-10-CM | POA: Insufficient documentation

## 2019-11-16 DIAGNOSIS — Z7982 Long term (current) use of aspirin: Secondary | ICD-10-CM | POA: Insufficient documentation

## 2019-11-16 DIAGNOSIS — M109 Gout, unspecified: Secondary | ICD-10-CM | POA: Diagnosis not present

## 2019-11-16 MED ORDER — FARXIGA 10 MG PO TABS: 10.0000 mg | ORAL_TABLET | Freq: Every day | ORAL | 6 refills | Status: DC

## 2019-11-16 NOTE — Patient Instructions (Signed)
Start Farxiga 10 mg daily  Your physician recommends that you schedule a follow-up appointment in: 3 months

## 2019-11-16 NOTE — Progress Notes (Addendum)
Advanced Heart Failure Clinic Note   Date:  11/16/2019   ID:  Matthew Romans Sr., DOB 08-Oct-1951, MRN [id number]  Location: Home  Provider location: [facility] Type of Visit: Established patient  PCP:  Colon Branch, MD  Cardiologist:  No primary care provider on file. Primary HF: Johnston Maddocks  Chief Complaint: Heart Failure follow-up   History of Present Illness:  Matthew Joyce is a 68 y.o. male with a history of a CAD S/P CABG in 2006 (LIMA -> LAD, SVG -> Ramus, SVG -> OM-2, SVG -> RCA), s/p ICD, ischemic MR, DM, hyperlipidemia, and throat cancer s/p chemo/XRT in 2018  Stress test 08/14/2016 with EF 31%, old infarct, no ischemia.   Developed CP and SOB in early October. Cath on 09/20/19. Coronary anatomy was stable but EF down to 20% on cath with 3+ MR Echo on 09/20/19 read as EF 25-30% with mod MR. (I reviewed echo and EF 20-25% and moderate to severe MR). Since that time has had CPX with only mild HF limitation.  TEE EF 20-25%. 3+MR and severe TR.   Says he feels much better. Not as active because the gym is closed but able to go for walks and can walk 0.5 mile without a problem. Can also go up steps without a problem. No edema, orthopnea or PND. SBP at home ~110. No readings under 100 for several months.   Cath 09/20/19   1. 3v CAD  2. Patent LIMA to LAD 3. Patent free RIMA to ramus 4. Occluded SVG to RCA with widely patent native RCA 5. Unable to locate SVG to OM2 mentioned in OP report. There is no graft marker for this graft and Ar root shot did not show any SVG to OM (suspect may not have been placed) 6. EF 20% with global HK and 3+ MR   Ao = 108/53 (72) LV = 109/19 RA = 9 RV = 60/10 PA = 63/23 (36) PCW = 22 (v = 35-40) Fick cardiac output/index = 4.5/2.4 PVR = 3.0 WU SVR 1310 Ao sat = 99% PA sat = 66%, 69%  CPX test 10/20/19   FVC 3.22 (81%)    FEV1 2.54 (84%)     FEV1/FVC 79 (103%)     MVV 92 (76%)     Resting HR: 44  Standing HR: 47 Peak HR: 107  (71% age predicted max HR)  BP rest: 92/56 Standing BP: 86/52 BP peak: 116/62  Peak VO2: 24.5 (88% predicted peak VO2)  VE/VCO2 slope: 37  OUES: 2.11  Peak RER: 1.01 O2pulse: 19  (127% predicted O2pulse)    TEE 10/24/19  1. Left ventricular ejection fraction 20-25%  2. Global right ventricle has moderately reduced systolic function.  3. Left atrial size was severely dilated.  4. No LAA clot.  5. Right atrial size was mild-moderately dilated.  6. The mitral valve is abnormal. Moderate mitral valve regurgitation.  7. Mildly restricted posterior MV leaflet with 3+ MR.  8. The tricuspid valve is normal in structure. Tricuspid valve regurgitation is severe.   Echo 07/2016 LVEF 30-35%, Mod MR, Mod LAE, Mildly dilated RV with mild reduced RV function, PA peak pressure 51 mm Hg.   Matthew D Yurick Sr. denies symptoms worrisome for COVID 19.   Past Medical History:  Diagnosis Date  . AICD (automatic cardioverter/defibrillator) present 6/09   St. Jude  . Bradycardia    Use of beta blocker limited  . CAD (coronary artery  disease)    a- S/p cabg in march 2006 by Dr.Owen;  b. myoview 5/12: large IL scar from apex to base, no ischemia, EF 37%  . Cancer ([facility])    on right side of neck  . ED (erectile dysfunction)   . Gout   . Hyperlipidemia   . Ischemic cardiomyopathy    a-Echocardiogram, Nov 2006, showed ejection fraction of 30-40% with mild to moderated mitral  regurgitation and mild aortic regurgitation b- Cardiac MRI, May 2007, EF of 44% with 50% scar involving  the inferolateral walls. No comment of mitral regurgitation. c- last echo  11/10: EF 30-35%  mod MR d- Nega T-Wave alternans testing in May of 2007 e- no ACE due to low BP;  f. CPX 3/11: normal  . 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 Systolic CHF, chronic ([facility])    Past Surgical History:  Procedure Laterality Date  . CARDIAC DEFIBRILLATOR PLACEMENT  06/01/08    ICD  . COLONOSCOPY    . COLONOSCOPY WITH PROPOFOL N/A 07/28/2019   Procedure: COLONOSCOPY WITH PROPOFOL;  Surgeon: [name], MD;  Location: [facility];  Service: Endoscopy;  Laterality: N/A;  . CORONARY ARTERY BYPASS GRAFT  02-2005  . ICD GENERATOR CHANGEOUT N/A 02/03/2017   Procedure: ICD Generator Changeout;  Surgeon: [name], MD;  Location: [facility];  Service: Cardiovascular;  Laterality: N/A;  . ORIF Clifford   left   . RIGHT/LEFT HEART CATH AND CORONARY/GRAFT ANGIOGRAPHY N/A 09/20/2019   Procedure: RIGHT/LEFT HEART CATH AND CORONARY/GRAFT ANGIOGRAPHY;  Surgeon: [name], MD;  Location: [facility];  Service: Cardiovascular;  Laterality: N/A;  . SHOULDER ARTHROSCOPY  02/2010   rt   . TEE WITHOUT CARDIOVERSION N/A 10/24/2019   Procedure: TRANSESOPHAGEAL ECHOCARDIOGRAM (TEE);  Surgeon: [name], MD;  Location: [facility] ENDOSCOPY;  Service: Cardiovascular;  Laterality: N/A;  . TONSILLECTOMY     as a child     Current Outpatient Medications  Medication Sig Dispense Refill  . acetaminophen (TYLENOL) 325 MG tablet Take 650 mg by mouth every 6 (six) hours as needed (for pain.).    Marland Kitchen aspirin EC 81 MG tablet Take 81 mg by mouth at bedtime.    . candesartan (ATACAND) 4 MG tablet Take 1 tablet (4 mg total) by mouth at bedtime. 90 tablet 3  . carboxymethylcellulose (REFRESH PLUS) 0.5 % SOLN Place 1 drop into both eyes daily.     . cetirizine (ZYRTEC) 10 MG tablet Take 10 mg by mouth daily.      . Colchicine (MITIGARE) 0.6 MG CAPS Take 0.6 mg by mouth 2 (two) times daily as needed (gout flare). 60 capsule 0  . furosemide (LASIX) 40 MG tablet TAKE 1 TABLET EVERY DAY 30 tablet 3  . metoprolol succinate (TOPROL-XL) 25 MG 24 hr tablet Take 0.5 tablets (12.5 mg total) by mouth 2 (two) times daily. 90 tablet 3  . nitroGLYCERIN (NITROSTAT) 0.4 MG SL tablet Place 1 tablet (0.4 mg total) under the tongue every 5 (five) minutes as needed for chest  pain (MAX 3 TABLETS). (Patient taking differently: Place 0.4 mg under the tongue every 5 (five) minutes x 3 doses as needed for chest pain (MAX 3 TABLETS). ) 25 tablet 6  . Omega-3 Fatty Acids (FISH OIL) 1200 MG CAPS Take 3,600 mg by mouth daily.    . rosuvastatin (CRESTOR) 40 MG tablet Take 1 tablet (40 mg total) by mouth  at bedtime. 90 tablet 3  . spironolactone (ALDACTONE) 25 MG tablet Take 0.5 tablets (12.5 mg total) by mouth daily. 45 tablet 3   No current facility-administered medications for this encounter.     Allergies:   Atorvastatin, Sulfonamide derivatives, Tape, and Sulfamethoxazole   Social History:  The patient  reports that he quit smoking about 19 years ago. His smoking use included cigarettes. He smoked 0.10 packs per day. He has never used smokeless tobacco. He reports current alcohol use. He reports that he does not use drugs.   Family History:  The patient's family history includes Cardiomyopathy in his brother; Heart attack (age of onset: 45) in his father.   ROS:  Please see the history of present illness.   All other systems are personally reviewed and negative.   Vitals:   11/16/19 1346  BP: 112/70  Pulse: (!) 47  SpO2: 97%  Weight: 78.7 kg (173 lb 6.4 oz)    Exam:   General:  Well appearing. No resp difficulty HEENT: normal Neck: supple. no JVD. Carotids 2+ bilat; no bruits. No lymphadenopathy or thryomegaly appreciated. Cor: PMI nondisplaced. Loletha Grayer regular . 2/6 MR Lungs: clear Abdomen: soft, nontender, nondistended. No hepatosplenomegaly. No bruits or masses. Good bowel sounds. Extremities: no cyanosis, clubbing, rash, edema Neuro: alert & orientedx3, cranial nerves grossly intact. moves all 4 extremities w/o difficulty. Affect pleasant  Recent Labs: 06/09/2019: ALT 21 08/09/2019: TSH 5.87 10/12/2019: B Natriuretic Peptide 633.7 10/20/2019: BUN 12; Creatinine, Ser 0.93; Hemoglobin 14.3; Platelets 159; Potassium 4.1; Sodium 136  Personally reviewed    Wt Readings from Last 3 Encounters:  11/16/19 78.7 kg (173 lb 6.4 oz)  10/24/19 76.7 kg (169 lb)  10/12/19 78.3 kg (172 lb 9.6 oz)      ASSESSMENT AND PLAN:  1. Acute on Chronic Systolic Heart Failure ICM ECHO 08/2013 EF 45-50%  Has St Jude ICD 2009 -  Echo 07/2016 LVEF 30-35%, Mod MR, Mod LAE, Mildly dilated RV with mild reduced RV function, PA peak pressure 51 mm Hg.  - TEE Left ventricular ejection fraction 20-25% Moderately reduced RV function 3+ MR severe TR - He has improved markedly with diuresis.  - Now NYHA II - CPX with preserved VO2 with moderately elevated slope  - Little BP room to titrate medical therapy aggressively. I suspect we may be eventually headed toward advanced therapies but age and throat cancer in 2018 may be barrier to transplant  - For now continue aggressive medical therapy. Will review valvular disease with structural team.  - Continue Toprol XL 12.5 mg BID.  - Continue Atacand 4 mg daily. (BP toos oft for Entresto) - Add Farxiga 10  - Continue lasix - Reinforced fluid restriction to < 2 L daily, sodium restriction to less than 2000 mg daily, and the importance of daily weights.  - Blood Type O positive - ICD interrogated in clinic. No VT. Volume status ok. Activity level 2h/day  2. CAD S/P CABG 2006 - No s/s angina - Recent coronary angio with stable revascularization. - Continue aspirin and simvastatin 3. Hyperlipidemia -Continue Crestor. Goal LDL < 70  - Per PCP 4. RBBB 5.  Shock for VF 06/09/16 - No further.  - Follows with [name].  6. Throat Cancer - on R side. Squamous Cell Carcinoma with unclear primary.  - s/p chemo/XRT at [facility] in 2018   Total time spent 35 minutes. Over half that time spent discussing above with him and his wife.    Signed, Quillian Quince  Colie Fugitt, MD  11/16/2019 1:58 PM  Advanced Heart Failure [place] [id number] [phone] (office)  [phone] (fax)

## 2019-11-28 NOTE — Progress Notes (Signed)
Remote ICD transmission.   

## 2019-12-14 ENCOUNTER — Other Ambulatory Visit (HOSPITAL_COMMUNITY): Payer: Self-pay | Admitting: Internal Medicine

## 2019-12-26 DIAGNOSIS — R69 Illness, unspecified: Secondary | ICD-10-CM | POA: Diagnosis not present

## 2019-12-28 DIAGNOSIS — C770 Secondary and unspecified malignant neoplasm of lymph nodes of head, face and neck: Secondary | ICD-10-CM | POA: Diagnosis not present

## 2019-12-28 DIAGNOSIS — H9311 Tinnitus, right ear: Secondary | ICD-10-CM | POA: Diagnosis not present

## 2019-12-28 DIAGNOSIS — C801 Malignant (primary) neoplasm, unspecified: Secondary | ICD-10-CM | POA: Diagnosis not present

## 2020-01-10 DIAGNOSIS — R69 Illness, unspecified: Secondary | ICD-10-CM | POA: Diagnosis not present

## 2020-01-12 DIAGNOSIS — C770 Secondary and unspecified malignant neoplasm of lymph nodes of head, face and neck: Secondary | ICD-10-CM | POA: Diagnosis not present

## 2020-01-12 DIAGNOSIS — C801 Malignant (primary) neoplasm, unspecified: Secondary | ICD-10-CM | POA: Diagnosis not present

## 2020-01-12 DIAGNOSIS — R590 Localized enlarged lymph nodes: Secondary | ICD-10-CM | POA: Diagnosis not present

## 2020-01-16 ENCOUNTER — Telehealth: Payer: Self-pay

## 2020-01-16 NOTE — Telephone Encounter (Signed)
Alert received- VF episode 01/14/20 0022.  Treated with 25j shock.    Spoke with pt, he was unaware of episode/ treatment.  Does not even recall being awakened.  Reports he feels good today, no cardiac complaints.  He does report symptoms of head cold/ nasal congestion, afebrile.  Pt confirms that he is taking medications as ordered- ASA81mg  daily, Candesartan 4mg  daily, Furosemide 40mg  daily, Metoprolol 12.5 mg BID, Spironolactone 25mg  daily.   Educated pt on [employer] regulations, no driving for 6 months following ICD therapy received.    Pt is due for in person Defib check, will have scheduling contact to schedule in upcoming weeks.

## 2020-01-17 ENCOUNTER — Telehealth: Payer: Self-pay | Admitting: Internal Medicine

## 2020-01-17 NOTE — Telephone Encounter (Signed)
Form received- placed in MD red folder for completion.

## 2020-01-17 NOTE — Telephone Encounter (Signed)
Good Morning, patient called in today stating that he is transferring from a gym in [place] to [employer]. Patient needs Dr Larose Kells to sign off stating that he can attend the gym. Patient state that he will drop letter off today and would like it signed today if possible.

## 2020-01-19 ENCOUNTER — Other Ambulatory Visit: Payer: Self-pay

## 2020-01-19 ENCOUNTER — Ambulatory Visit: Payer: Medicare HMO | Admitting: Internal Medicine

## 2020-01-19 ENCOUNTER — Encounter: Payer: Self-pay | Admitting: Internal Medicine

## 2020-01-19 VITALS — BP 102/62 | HR 49 | Ht 69.0 in | Wt 175.2 lb

## 2020-01-19 DIAGNOSIS — Z951 Presence of aortocoronary bypass graft: Secondary | ICD-10-CM

## 2020-01-19 DIAGNOSIS — Z95810 Presence of automatic (implantable) cardiac defibrillator: Secondary | ICD-10-CM

## 2020-01-19 DIAGNOSIS — I5022 Chronic systolic (congestive) heart failure: Secondary | ICD-10-CM

## 2020-01-19 NOTE — Patient Instructions (Signed)
Medication Instructions:  Your physician recommends that you continue on your current medications as directed. Please refer to the Current Medication list given to you today.  Labwork: None ordered.  Testing/Procedures: None ordered.  Follow-Up: Your physician wants you to follow-up in: one year with [name].   You will receive a reminder letter in the mail two months in advance. If you don't receive a letter, please call our office to schedule the follow-up appointment.  Remote monitoring is used to monitor your ICD from home. This monitoring reduces the number of office visits required to check your device to one time per year. It allows [place] to keep an eye on the functioning of your device to ensure it is working properly. You are scheduled for a device check from home on 01/31/2020. You may send your transmission at any time that day. If you have a wireless device, the transmission will be sent automatically.   Any Other Special Instructions Will Be Listed Below (If Applicable).  If you need a refill on your cardiac medications before your next appointment, please call your pharmacy.

## 2020-01-19 NOTE — Telephone Encounter (Signed)
Pt didn't want to wait on form to be signed any longer- he will come today to pick up. Placed at front desk uncompleted.    *Note- he walked into office to drop off paperwork on Tuesday and informed someone on the phone informed him that we would interrupt [name] between Pt's to have him sign form then- I informed that this is not correct.

## 2020-01-19 NOTE — Progress Notes (Signed)
HPI [name] returns today for followup of CAD, ICM, chronic systolic heart failure, s/p ICD insertion. He has a h/o squamous cell CA with a solitary lymph node. He has a h/o CAD, s/p CABG. He denies chest pain or sob. He was sleeping a few nights ago and received an ICD shock. He was found to have gone into VF and was successfully defibrillated. He felt poorly for a few days after the episode.  Allergies  Allergen Reactions  . Atorvastatin      myalgia, Muscle aching  . Sulfonamide Derivatives     Rash and hot flashes  . Tape Other (See Comments)    Rips skin- use PAPER tape  . Sulfamethoxazole Rash and Other (See Comments)    Hot flashes     Current Outpatient Medications  Medication Sig Dispense Refill  . acetaminophen (TYLENOL) 325 MG tablet Take 650 mg by mouth every 6 (six) hours as needed (for pain.).    Marland Kitchen aspirin EC 81 MG tablet Take 81 mg by mouth at bedtime.    . candesartan (ATACAND) 4 MG tablet Take 1 tablet (4 mg total) by mouth at bedtime. 90 tablet 3  . carboxymethylcellulose (REFRESH PLUS) 0.5 % SOLN Place 1 drop into both eyes daily.     . cetirizine (ZYRTEC) 10 MG tablet Take 10 mg by mouth daily.      . Colchicine (MITIGARE) 0.6 MG CAPS Take 0.6 mg by mouth 2 (two) times daily as needed (gout flare). 60 capsule 0  . dapagliflozin propanediol (FARXIGA) 10 MG TABS tablet Take 10 mg by mouth daily before breakfast. 30 tablet 6  . furosemide (LASIX) 40 MG tablet TAKE 1 TABLET EVERY DAY 90 tablet 1  . metoprolol succinate (TOPROL-XL) 25 MG 24 hr tablet Take 0.5 tablets (12.5 mg total) by mouth 2 (two) times daily. 90 tablet 3  . nitroGLYCERIN (NITROSTAT) 0.4 MG SL tablet Place 0.4 mg under the tongue every 5 (five) minutes as needed for chest pain.    . Omega-3 Fatty Acids (FISH OIL) 1200 MG CAPS Take 3,600 mg by mouth daily.    . rosuvastatin (CRESTOR) 40 MG tablet Take 1 tablet (40 mg total) by mouth at bedtime. 90 tablet 3  . meclizine (ANTIVERT) 12.5 MG  tablet Take 12.5 mg by mouth as needed for dizziness.    Marland Kitchen spironolactone (ALDACTONE) 25 MG tablet Take 0.5 tablets (12.5 mg total) by mouth daily. 45 tablet 3   No current facility-administered medications for this visit.     Past Medical History:  Diagnosis Date  . AICD (automatic cardioverter/defibrillator) present 6/09   St. Jude  . Bradycardia    Use of beta blocker limited  . CAD (coronary artery disease)    a- S/p cabg in march 2006 by Dr.Owen;  b. myoview 5/12: large IL scar from apex to base, no ischemia, EF 37%  . Cancer ([place])    on right side of neck  . ED (erectile dysfunction)   . Gout   . Hyperlipidemia   . Ischemic cardiomyopathy    a-Echocardiogram, Nov 2006, showed ejection fraction of 30-40% with mild to moderated mitral  regurgitation and mild aortic regurgitation b- Cardiac MRI, May 2007, EF of 44% with 50% scar involving  the inferolateral walls. No comment of mitral regurgitation. c- last echo  11/10: EF 30-35%  mod MR d- Nega T-Wave alternans testing in May of 2007 e- no ACE due to low BP;  f. CPX 3/11:  normal  . 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 Systolic CHF, chronic ([facility])     ROS:   All systems reviewed and negative except as noted in the HPI.   Past Surgical History:  Procedure Laterality Date  . CARDIAC DEFIBRILLATOR PLACEMENT  06/01/08   ICD  . COLONOSCOPY    . COLONOSCOPY WITH PROPOFOL N/A 07/28/2019   Procedure: COLONOSCOPY WITH PROPOFOL;  Surgeon: [name], MD;  Location: [facility];  Service: Endoscopy;  Laterality: N/A;  . CORONARY ARTERY BYPASS GRAFT  02-2005  . ICD GENERATOR CHANGEOUT N/A 02/03/2017   Procedure: ICD Generator Changeout;  Surgeon: [name], MD;  Location: [facility];  Service: Cardiovascular;  Laterality: N/A;  . ORIF Joy   left   . RIGHT/LEFT HEART CATH AND CORONARY/GRAFT ANGIOGRAPHY N/A 09/20/2019   Procedure: RIGHT/LEFT HEART CATH AND  CORONARY/GRAFT ANGIOGRAPHY;  Surgeon: [name], MD;  Location: [facility];  Service: Cardiovascular;  Laterality: N/A;  . SHOULDER ARTHROSCOPY  02/2010   rt   . TEE WITHOUT CARDIOVERSION N/A 10/24/2019   Procedure: TRANSESOPHAGEAL ECHOCARDIOGRAM (TEE);  Surgeon: [name], MD;  Location: [facility] ENDOSCOPY;  Service: Cardiovascular;  Laterality: N/A;  . TONSILLECTOMY     as a child     Family History  Problem Relation Age of Onset  . Heart attack Father 94  . Cardiomyopathy Brother   . Colon cancer Neg Hx   . Prostate cancer Neg Hx   . Diabetes Neg Hx      Social History   Socioeconomic History  . Marital status: Married    Spouse name: Not on file  . Number of children: 2  . Years of education: Not on file  . Highest education level: Not on file  Occupational History  . 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 Smoking status: Former Smoker    Packs/day: 0.10    Types: Cigarettes    Quit date: 12/16/1999    Years since quitting: 20.1  . Smokeless tobacco: Never Used  Substance and Sexual Activity  . Alcohol use: Yes    Comment: socially  . Drug use: No  . Sexual activity: Not on file  Other Topics Concern  . Not on file  Social History Narrative   Retired and happy about it    household- pt and wife   2 adult children in [place] also has grandchildren   Former but not current smoker, occasional alcohol no drug use   Social Determinants of Radio broadcast assistant Strain:   . Difficulty of Paying Living Expenses: Not on file  Food Insecurity:   . Worried About Charity fundraiser in the Last Year: Not on file  . Ran Out of Food in the Last Year: Not on file  Transportation Needs:   . Lack of Transportation (Medical): Not on file  . Lack of Transportation (Non-Medical): Not on file  Physical Activity:   . Days of Exercise per Week: Not on file   . Minutes of Exercise per Session: Not on file  Stress:   . Feeling of Stress : Not on file  Social Connections:   . Frequency of Communication with Friends and Family: Not on file  . Frequency of Social Gatherings with Friends and Family: Not on file  . Attends Religious Services:  Not on file  . Active Member of Clubs or Organizations: Not on file  . Attends Archivist Meetings: Not on file  . Marital Status: Not on file  Intimate Partner Violence:   . Fear of Current or Ex-Partner: Not on file  . Emotionally Abused: Not on file  . Physically Abused: Not on file  . Sexually Abused: Not on file     BP 102/62   Pulse (!) 49   Ht 5\' 9"  (1.753 m)   Wt 175 lb 3.2 oz (79.5 kg)   SpO2 99%   BMI 25.87 kg/m   Physical Exam:  Well appearing NAD HEENT: Unremarkable Neck:  No JVD, no thyromegally Lymphatics:  No adenopathy Back:  No CVA tenderness Lungs:  Clear with no wheezes HEART:  Regular rate rhythm, no murmurs, no rubs, no clicks Abd:  soft, positive bowel sounds, no organomegally, no rebound, no guarding Ext:  2 plus pulses, no edema, no cyanosis, no clubbing Skin:  No rashes no nodules Neuro:  CN II through XII intact, motor grossly intact  EKG - nsr with biv pacing  DEVICE  Normal device function.  See PaceArt for details.   Assess/Plan: 1. VF - he will continue his current meds. We will try to avoid amiodarone for now.  2. ICD - his St. Jude biv ICD is working normally. We will recheck in several months. 3. Chronic systolic heart failure - his symptoms are class 2. He will continue his current meds. I encouraged him to continue to exercise and to maintain a low sodium diet.  [name].D.

## 2020-01-31 ENCOUNTER — Ambulatory Visit (INDEPENDENT_AMBULATORY_CARE_PROVIDER_SITE_OTHER): Payer: Medicare HMO | Admitting: *Deleted

## 2020-01-31 DIAGNOSIS — I5022 Chronic systolic (congestive) heart failure: Secondary | ICD-10-CM | POA: Diagnosis not present

## 2020-01-31 LAB — CUP PACEART REMOTE DEVICE CHECK
Battery Remaining Longevity: 77 mo
Battery Remaining Percentage: 75 %
Battery Voltage: 2.98 V
Brady Statistic RV Percent Paced: 2.4 %
Date Time Interrogation Session: 20210216040032
HighPow Impedance: 50 Ohm
HighPow Impedance: 51 Ohm
Implantable Lead Implant Date: 20090618
Implantable Lead Location: 753860
Implantable Lead Model: 7121
Implantable Pulse Generator Implant Date: 20180220
Lead Channel Impedance Value: 440 Ohm
Lead Channel Pacing Threshold Amplitude: 1 V
Lead Channel Pacing Threshold Pulse Width: 0.8 ms
Lead Channel Sensing Intrinsic Amplitude: 5.2 mV
Lead Channel Setting Pacing Amplitude: 2.5 V
Lead Channel Setting Pacing Pulse Width: 0.8 ms
Lead Channel Setting Sensing Sensitivity: 0.5 mV
Pulse Gen Serial Number: 7406967

## 2020-02-01 DIAGNOSIS — C770 Secondary and unspecified malignant neoplasm of lymph nodes of head, face and neck: Secondary | ICD-10-CM | POA: Diagnosis not present

## 2020-02-01 DIAGNOSIS — Z923 Personal history of irradiation: Secondary | ICD-10-CM | POA: Diagnosis not present

## 2020-02-01 DIAGNOSIS — Z9221 Personal history of antineoplastic chemotherapy: Secondary | ICD-10-CM | POA: Diagnosis not present

## 2020-02-01 DIAGNOSIS — C801 Malignant (primary) neoplasm, unspecified: Secondary | ICD-10-CM | POA: Diagnosis not present

## 2020-02-01 NOTE — Progress Notes (Signed)
ICD Remote  

## 2020-02-27 ENCOUNTER — Encounter (HOSPITAL_COMMUNITY): Payer: Self-pay | Admitting: Internal Medicine

## 2020-02-27 ENCOUNTER — Ambulatory Visit (HOSPITAL_COMMUNITY)
Admission: RE | Admit: 2020-02-27 | Discharge: 2020-02-27 | Disposition: A | Discharge status: Home / Self Care | Payer: Medicare HMO | Source: Ambulatory Visit | Attending: Internal Medicine | Admitting: Internal Medicine

## 2020-02-27 ENCOUNTER — Other Ambulatory Visit: Payer: Self-pay

## 2020-02-27 VITALS — BP 100/52 | HR 55 | Wt 169.8 lb

## 2020-02-27 DIAGNOSIS — I2510 Atherosclerotic heart disease of native coronary artery without angina pectoris: Secondary | ICD-10-CM | POA: Diagnosis not present

## 2020-02-27 DIAGNOSIS — Z7984 Long term (current) use of oral hypoglycemic drugs: Secondary | ICD-10-CM | POA: Diagnosis not present

## 2020-02-27 DIAGNOSIS — I081 Rheumatic disorders of both mitral and tricuspid valves: Secondary | ICD-10-CM | POA: Insufficient documentation

## 2020-02-27 DIAGNOSIS — I5022 Chronic systolic (congestive) heart failure: Secondary | ICD-10-CM

## 2020-02-27 DIAGNOSIS — N529 Male erectile dysfunction, unspecified: Secondary | ICD-10-CM | POA: Insufficient documentation

## 2020-02-27 DIAGNOSIS — Z923 Personal history of irradiation: Secondary | ICD-10-CM | POA: Diagnosis not present

## 2020-02-27 DIAGNOSIS — Z951 Presence of aortocoronary bypass graft: Secondary | ICD-10-CM | POA: Insufficient documentation

## 2020-02-27 DIAGNOSIS — Z85819 Personal history of malignant neoplasm of unspecified site of lip, oral cavity, and pharynx: Secondary | ICD-10-CM | POA: Insufficient documentation

## 2020-02-27 DIAGNOSIS — Z79899 Other long term (current) drug therapy: Secondary | ICD-10-CM | POA: Insufficient documentation

## 2020-02-27 DIAGNOSIS — Z87891 Personal history of nicotine dependence: Secondary | ICD-10-CM | POA: Insufficient documentation

## 2020-02-27 DIAGNOSIS — M109 Gout, unspecified: Secondary | ICD-10-CM | POA: Diagnosis not present

## 2020-02-27 DIAGNOSIS — Z8249 Family history of ischemic heart disease and other diseases of the circulatory system: Secondary | ICD-10-CM | POA: Diagnosis not present

## 2020-02-27 DIAGNOSIS — Z9221 Personal history of antineoplastic chemotherapy: Secondary | ICD-10-CM | POA: Insufficient documentation

## 2020-02-27 DIAGNOSIS — E785 Hyperlipidemia, unspecified: Secondary | ICD-10-CM | POA: Insufficient documentation

## 2020-02-27 DIAGNOSIS — I472 Ventricular tachycardia, unspecified: Secondary | ICD-10-CM

## 2020-02-27 DIAGNOSIS — Z95810 Presence of automatic (implantable) cardiac defibrillator: Secondary | ICD-10-CM | POA: Diagnosis not present

## 2020-02-27 DIAGNOSIS — I452 Bifascicular block: Secondary | ICD-10-CM | POA: Insufficient documentation

## 2020-02-27 DIAGNOSIS — Z7982 Long term (current) use of aspirin: Secondary | ICD-10-CM | POA: Diagnosis not present

## 2020-02-27 DIAGNOSIS — I5023 Acute on chronic systolic (congestive) heart failure: Secondary | ICD-10-CM | POA: Insufficient documentation

## 2020-02-27 DIAGNOSIS — E119 Type 2 diabetes mellitus without complications: Secondary | ICD-10-CM | POA: Diagnosis not present

## 2020-02-27 DIAGNOSIS — I255 Ischemic cardiomyopathy: Secondary | ICD-10-CM | POA: Diagnosis not present

## 2020-02-27 LAB — BRAIN NATRIURETIC PEPTIDE: B Natriuretic Peptide: 306.7 pg/mL — ABNORMAL HIGH (ref 0.0–100.0)

## 2020-02-27 LAB — BASIC METABOLIC PANEL
Anion gap: 11 (ref 5–15)
BUN: 12 mg/dL (ref 8–23)
CO2: 27 mmol/L (ref 22–32)
Calcium: 9.2 mg/dL (ref 8.9–10.3)
Chloride: 96 mmol/L — ABNORMAL LOW (ref 98–111)
Creatinine, Ser: 0.88 mg/dL (ref 0.61–1.24)
GFR calc Af Amer: >60 mL/min (ref >60)
GFR calc non Af Amer: >60 mL/min (ref >60)
Glucose, Bld: 124 mg/dL — ABNORMAL HIGH (ref 70–99)
Potassium: 4 mmol/L (ref 3.5–5.1)
Sodium: 134 mmol/L — ABNORMAL LOW (ref 135–145)

## 2020-02-27 LAB — CBC
HCT: 42.3 % (ref 39.0–52.0)
Hemoglobin: 13.9 g/dL (ref 13.0–17.0)
MCH: 30.6 pg (ref 26.0–34.0)
MCHC: 32.9 g/dL (ref 30.0–36.0)
MCV: 93.2 fL (ref 80.0–100.0)
Platelets: 189 10*3/uL (ref 150–400)
RBC: 4.54 MIL/uL (ref 4.22–5.81)
RDW: 12.8 % (ref 11.5–15.5)
WBC: 4.5 10*3/uL (ref 4.0–10.5)
nRBC: 0 % (ref 0.0–0.2)

## 2020-02-27 MED ORDER — CANDESARTAN CILEXETIL 8 MG PO TABS: 8.0000 mg | ORAL_TABLET | Freq: Every day | ORAL | 3 refills | Status: DC

## 2020-02-27 MED ORDER — CANDESARTAN CILEXETIL 4 MG PO TABS: 4.0000 mg | ORAL_TABLET | Freq: Two times a day (BID) | ORAL | 3 refills | Status: DC

## 2020-02-27 NOTE — Patient Instructions (Signed)
Increase Atacand to 4 mg Twice daily   Labs done today, your results will be available in MyChart, we will contact you for abnormal readings.  Your physician recommends that you schedule a follow-up appointment in: 3 months with echocardiogram  If you have any questions or concerns before your next appointment please send [place] a message through [place] or call our office at [phone].  At the [facility], you and your health needs are our priority. As part of our continuing mission to provide you with exceptional heart care, we have created designated Provider Care Teams. These Care Teams include your primary Cardiologist (physician) and Advanced Practice Providers (APPs- Physician Assistants and Nurse Practitioners) who all work together to provide you with the care you need, when you need it.   You may see any of the following providers on your designated Care Team at your next follow up: Marland Kitchen Dr Glori Bickers . Dr Loralie Champagne . [name], NP . Lyda Jester, PA . Audry Riles, PharmD   Please be sure to bring in all your medications bottles to every appointment.

## 2020-02-27 NOTE — Progress Notes (Signed)
Per pharmacy, pt's insurance will only enough Atacand for 1 tab daily, new rx for 8 mg daily sent to pharmacy.  Per Dr Haroldine Laws he would like pt to cut it in half and take 1/2 tab (4mg ) Twice daily.  Attempted to call pt to make him aware and Left message to call back

## 2020-02-27 NOTE — Addendum Note (Signed)
Encounter addended by: Scarlette Calico, RN on: 02/27/2020 2:31 PM  Actions taken: Order list changed, Clinical Note Signed

## 2020-02-27 NOTE — Addendum Note (Signed)
Encounter addended by: Scarlette Calico, RN on: 02/27/2020 11:10 AM  Actions taken: Order list changed, Diagnosis association updated, Charge Capture section accepted, Clinical Note Signed

## 2020-02-27 NOTE — Progress Notes (Signed)
Advanced [facility] Failure Clinic Note   Date:  02/27/2020   ID:  Matthew Romans Sr., DOB [birth year], MRN [id number]  Location: Home  Provider location: [facility] Type of Visit: Established patient  PCP:  Colon Branch, MD  Cardiologist:  No primary care provider on file. Primary HF: Kiah Keay  Chief Complaint: Heart Failure follow-up   History of Present Illness:  Corson is a 69 y.o. male with a history of a CAD S/P CABG in 2006 (LIMA -> LAD, SVG -> Ramus, SVG -> OM-2, SVG -> RCA), s/p ICD, ischemic MR, DM, hyperlipidemia, and throat cancer s/p chemo/XRT in 2018  Stress test 08/14/2016 with EF 31%, old infarct, no ischemia.   Developed CP and SOB in early October. Cath on 09/20/19. Coronary anatomy was stable but EF down to 20% on cath with 3+ MR Echo on 09/20/19 read as EF 25-30% with mod MR. (I reviewed echo and EF 20-25% and moderate to severe MR). Since that time has had CPX with only mild HF limitation.  TEE EF 20-25%. 3+MR and severe TR.   On 2/14 woke from sleep for unknown reasons. Found to have shock for VT/VF. Saw [name]   He is here for routine f/u. Says he is feeling good. Back to exercising at the gym. No CP or SOB. Walks on TM 3.9 mph at incline 6% for 2.5 miles. 4x per week. No edema, orthopnea or PND. No dizziness.   ICD: No recurrent VT. Fluid looks good.   Cath 09/20/19   1. 3v CAD  2. Patent LIMA to LAD 3. Patent free RIMA to ramus 4. Occluded SVG to RCA with widely patent native RCA 5. Unable to locate SVG to OM2 mentioned in OP report. There is no graft marker for this graft and Ar root shot did not show any SVG to OM (suspect may not have been placed) 6. EF 20% with global HK and 3+ MR   Ao = 108/53 (72) LV = 109/19 RA = 9 RV = 60/10 PA = 63/23 (36) PCW = 22 (v = 35-40) Fick cardiac output/index = 4.5/2.4 PVR = 3.0 WU SVR 1310 Ao sat = 99% PA sat = 66%, 69%  CPX test 10/20/19   FVC 3.22 (81%)    FEV1 2.54  (84%)     FEV1/FVC 79 (103%)     MVV 92 (76%)     Resting HR: 44 Standing HR: 47 Peak HR: 107  (71% age predicted max HR)  BP rest: 92/56 Standing BP: 86/52 BP peak: 116/62  Peak VO2: 24.5 (88% predicted peak VO2)  VE/VCO2 slope: 37  OUES: 2.11  Peak RER: 1.01 O2pulse: 19  (127% predicted O2pulse)    TEE 10/24/19  1. Left ventricular ejection fraction 20-25%  2. Global right ventricle has moderately reduced systolic function.  3. Left atrial size was severely dilated.  4. No LAA clot.  5. Right atrial size was mild-moderately dilated.  6. The mitral valve is abnormal. Moderate mitral valve regurgitation.  7. Mildly restricted posterior MV leaflet with 3+ MR.  8. The tricuspid valve is normal in structure. Tricuspid valve regurgitation is severe.   Echo 07/2016 LVEF 30-35%, Mod MR, Mod LAE, Mildly dilated RV with mild reduced RV function, PA peak pressure 51 mm Hg.   Matthew D Krieger Sr. denies symptoms worrisome for COVID 19.   Past Medical History:  Diagnosis Date  . AICD (automatic cardioverter/defibrillator) present 6/09  St. Jude  . Bradycardia    Use of beta blocker limited  . CAD (coronary artery disease)    a- S/p cabg in march 2006 by Dr.Owen;  b. myoview 5/12: large IL scar from apex to base, no ischemia, EF 37%  . Cancer ([place])    on right side of neck  . ED (erectile dysfunction)   . Gout   . Hyperlipidemia   . Ischemic cardiomyopathy    a-Echocardiogram, Nov 2006, showed ejection fraction of 30-40% with mild to moderated mitral  regurgitation and mild aortic regurgitation b- Cardiac MRI, May 2007, EF of 44% with 50% scar involving  the inferolateral walls. No comment of mitral regurgitation. c- last echo  11/10: EF 30-35%  mod MR d- Nega T-Wave alternans testing in May of 2007 e- no ACE due to low BP;  f. CPX 3/11: normal  . 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 Systolic CHF, chronic ([place])    Past Surgical  History:  Procedure Laterality Date  . CARDIAC DEFIBRILLATOR PLACEMENT  06/01/08   ICD  . COLONOSCOPY    . COLONOSCOPY WITH PROPOFOL N/A 07/28/2019   Procedure: COLONOSCOPY WITH PROPOFOL;  Surgeon: [name], MD;  Location: [facility];  Service: Endoscopy;  Laterality: N/A;  . CORONARY ARTERY BYPASS GRAFT  02-2005  . ICD GENERATOR CHANGEOUT N/A 02/03/2017   Procedure: ICD Generator Changeout;  Surgeon: [name], MD;  Location: [facility];  Service: Cardiovascular;  Laterality: N/A;  . ORIF [place]   left   . RIGHT/LEFT HEART CATH AND CORONARY/GRAFT ANGIOGRAPHY N/A 09/20/2019   Procedure: RIGHT/LEFT HEART CATH AND CORONARY/GRAFT ANGIOGRAPHY;  Surgeon: [name], MD;  Location: [facility];  Service: Cardiovascular;  Laterality: N/A;  . SHOULDER ARTHROSCOPY  02/2010   rt   . TEE WITHOUT CARDIOVERSION N/A 10/24/2019   Procedure: TRANSESOPHAGEAL ECHOCARDIOGRAM (TEE);  Surgeon: [name], MD;  [facility] ENDOSCOPY;  Service: Cardiovascular;  Laterality: N/A;  . TONSILLECTOMY     as a child     Current Outpatient Medications  Medication Sig Dispense Refill  . acetaminophen (TYLENOL) 325 MG tablet Take 650 mg by mouth every 6 (six) hours as needed (for pain.).    Marland Kitchen aspirin EC 81 MG tablet Take 81 mg by mouth at bedtime.    . candesartan (ATACAND) 4 MG tablet Take 1 tablet (4 mg total) by mouth at bedtime. 90 tablet 3  . carboxymethylcellulose (REFRESH PLUS) 0.5 % SOLN Place 1 drop into both eyes daily.     . cetirizine (ZYRTEC) 10 MG tablet Take 10 mg by mouth daily.      . Colchicine (MITIGARE) 0.6 MG CAPS Take 0.6 mg by mouth 2 (two) times daily as needed (gout flare). 60 capsule 0  . dapagliflozin propanediol (FARXIGA) 10 MG TABS tablet Take 10 mg by mouth daily before breakfast. 30 tablet 6  . furosemide (LASIX) 40 MG tablet TAKE 1 TABLET EVERY DAY 90 tablet 1  . meclizine (ANTIVERT) 12.5 MG tablet Take 12.5 mg by mouth as needed  for dizziness.    . metoprolol succinate (TOPROL-XL) 25 MG 24 hr tablet Take 0.5 tablets (12.5 mg total) by mouth 2 (two) times daily. 90 tablet 3  . nitroGLYCERIN (NITROSTAT) 0.4 MG SL tablet Place 0.4 mg under the tongue every 5 (five) minutes as needed for chest pain.    . Omega-3 Fatty Acids (FISH OIL) 1200 MG  CAPS Take 3,600 mg by mouth daily.    . rosuvastatin (CRESTOR) 40 MG tablet Take 1 tablet (40 mg total) by mouth at bedtime. 90 tablet 3  . spironolactone (ALDACTONE) 25 MG tablet Take 0.5 tablets (12.5 mg total) by mouth daily. 45 tablet 3   No current facility-administered medications for this encounter.    Allergies:   Atorvastatin, Sulfonamide derivatives, Tape, and Sulfamethoxazole   Social History:  The patient  reports that he quit smoking about 20 years ago. His smoking use included cigarettes. He smoked 0.10 packs per day. He has never used smokeless tobacco. He reports current alcohol use. He reports that he does not use drugs.   Family History:  The patient's family history includes Cardiomyopathy in his brother; Heart attack (age of onset: 16) in his father.   ROS:  Please see the history of present illness.   All other systems are personally reviewed and negative.   Vitals:   02/27/20 1020  BP: (!) 100/52  Pulse: (!) 55  SpO2: 98%  Weight: 77 kg (169 lb 12.8 oz)    Exam:   General:  Well appearing. No resp difficulty HEENT: normal Neck: supple. no JVD. Carotids 2+ bilat; no bruits. No lymphadenopathy or thryomegaly appreciated. Cor: PMI nondisplaced. Regular rate & rhythm. No rubs, gallops or murmurs. Lungs: clear Abdomen: soft, nontender, nondistended. No hepatosplenomegaly. No bruits or masses. Good bowel sounds. Extremities: no cyanosis, clubbing, rash, edema Neuro: alert & orientedx3, cranial nerves grossly intact. moves all 4 extremities w/o difficulty. Affect pleasant   Recent Labs: 06/09/2019: ALT 21 08/09/2019: TSH 5.87 10/12/2019: B Natriuretic  Peptide 633.7 10/20/2019: BUN 12; Creatinine, Ser 0.93; Hemoglobin 14.3; Platelets 159; Potassium 4.1; Sodium 136  Personally reviewed   Wt Readings from Last 3 Encounters:  01/19/20 79.5 kg (175 lb 3.2 oz)  11/16/19 78.7 kg (173 lb 6.4 oz)  10/24/19 76.7 kg (169 lb)      ASSESSMENT AND PLAN:  1. Acute on Chronic Systolic Heart Failure ICM ECHO 08/2013 EF 45-50%  Has St Jude ICD 2009 -  Echo 07/2016 LVEF 30-35%, Mod MR, Mod LAE, Mildly dilated RV with mild reduced RV function, PA peak pressure 51 mm Hg.  - TEE 11/20 Left ventricular ejection fraction 20-25% Moderately reduced RV function 3+ MR severe TR - Much improved - Now NYHA I- II - CPX 11/20 with preserved VO2 with moderately elevated slope  - Little BP room to titrate medical therapy aggressively. I suspect we may be eventually headed toward advanced therapies but age and throat cancer in 2018 may be barrier to transplant (metastatic to lymph node with unknown primary) - For now continue aggressive medical therapy. .   - Continue Toprol XL 12.5 mg BID.  - Will try to increase Atacand to 4 mg bid and see if he can tolerate. (BP too soft for Entresto) - Continue Farxiga 10  - Continue lasix - Reinforced fluid restriction to < 2 L daily, sodium restriction to less than 2000 mg daily, and the importance of daily weights.  - Blood Type O positive - ICD interrogated in clinic. No recurrent VT. Volume status ok. 2.9% VP   - F/u in 3 months with echo - Will not repeat CPX at this time due to excellent exercise tolerance  2. CAD S/P CABG 2006 - No s/s angina - Coronary angio 10/20 with stable revascularization. - Continue aspirin and simvastatin - Labs today 3. Hyperlipidemia -Continue Crestor. Goal LDL < 70  - Per PCP  4. RBBB - stable  5.  Shock for VF 06/09/16 - Recurrent VT/VF 2/14. No recurrence since on ICD interrogation today - Follows with [name].  6. Throat Cancer - on R side. Squamous Cell Carcinoma with unclear  primary.  - s/p chemo[facility] at [facility] in 2018   Signed, Glori Bickers, MD  02/27/2020 10:13 AM  Advanced Heart Failure [address] [place] [address] [place] [address] [phone] (office) [phone] (fax)

## 2020-02-28 ENCOUNTER — Telehealth: Payer: Self-pay | Admitting: Internal Medicine

## 2020-02-28 NOTE — Progress Notes (Signed)
  Chronic Care Management   Note  02/28/2020 Name: Matthew ATHEY Sr. MRN: [id number] DOB: November 15, 1951  Matthew Romans Sr. is a 69 y.o. year old male who is a primary care patient of Paz, Alda Berthold, MD. I reached out to [place] by phone today in response to a referral sent by [name]'s PCP, Colon Branch, MD.   [name] was given information about Chronic Care Management services today including:  1. CCM service includes personalized support from designated clinical staff supervised by his physician, including individualized plan of care and coordination with other care providers 2. 24/7 contact phone numbers for assistance for urgent and routine care needs. 3. Service will only be billed when office clinical staff spend 20 minutes or more in a month to coordinate care. 4. Only one practitioner may furnish and bill the service in a calendar month. 5. The patient may stop CCM services at any time (effective at the end of the month) by phone call to the office staff.   Patient agreed to services and verbal consent obtained.   Follow up plan:   Raynicia Dukes [employer] Scheduler

## 2020-02-29 ENCOUNTER — Other Ambulatory Visit: Payer: Self-pay

## 2020-02-29 ENCOUNTER — Ambulatory Visit: Payer: Medicare HMO | Admitting: Pharmacist

## 2020-02-29 DIAGNOSIS — I25810 Atherosclerosis of coronary artery bypass graft(s) without angina pectoris: Secondary | ICD-10-CM

## 2020-02-29 DIAGNOSIS — I5022 Chronic systolic (congestive) heart failure: Secondary | ICD-10-CM

## 2020-02-29 DIAGNOSIS — M109 Gout, unspecified: Secondary | ICD-10-CM

## 2020-02-29 DIAGNOSIS — E785 Hyperlipidemia, unspecified: Secondary | ICD-10-CM

## 2020-02-29 DIAGNOSIS — I509 Heart failure, unspecified: Secondary | ICD-10-CM

## 2020-02-29 DIAGNOSIS — R739 Hyperglycemia, unspecified: Secondary | ICD-10-CM

## 2020-02-29 NOTE — Patient Instructions (Signed)
Visit Information  Goals Addressed            This Visit's Progress   . A1c goal <6.5%       -Pre-Diabetes a1c range is 5.7% to 6.4%. Your most recent a1c was 6.4% on 06/09/19.  -Usually the full diabetes diagnosis is given when a1c reaches 6.5% or higher.     . LDL goal <70      . Limit the amount of carbohydrates eaten      . Pharmacy Care Plan       CARE PLAN ENTRY  Current Barriers:  . Chronic Disease Management support, education, and care coordination needs related to Pre-DM, HF, HTN, HLD, CAD, Gout, Pain, Dry Eye, Allergies  Pharmacist Clinical Goal(s):  Marland Kitchen A1c goal <6.5% . Blood pressure goal <130/80 but also >90/60 . LDL goal <70  Interventions: . Comprehensive medication review performed. . Limit the amount of carbohydrates eaten . Take candesartan as 4mg  twice daily (Use 1/2 tablet of new prescription of candesartan 8mg )  Patient Self Care Activities:  . Patient verbalizes understanding of plan to follow as described above, Self administers medications as prescribed, Calls pharmacy for medication refills, and Calls provider office for new concerns or questions  Initial goal documentation     . Remember take candesartan 8mg  as 1/2 tablet twice daily         Mr. Mayhew was given information about Chronic Care Management services today including:  1. CCM service includes personalized support from designated clinical staff supervised by his physician, including individualized plan of care and coordination with other care providers 2. 24/7 contact phone numbers for assistance for urgent and routine care needs. 3. Standard insurance, coinsurance, copays and deductibles apply for chronic care management only during months in which we provide at least 20 minutes of these services. Most insurances cover these services at 100%, however patients may be responsible for any copay, coinsurance and/or deductible if applicable. This service may help you avoid the need for more  expensive face-to-face services. 4. Only one practitioner may furnish and bill the service in a calendar month. 5. The patient may stop CCM services at any time (effective at the end of the month) by phone call to the office staff.  Patient agreed to services and verbal consent obtained.   The patient verbalized understanding of instructions provided today and agreed to receive a mailed copy of patient instruction and/or educational materials. Telephone follow up appointment with pharmacy team member scheduled for: 08/28/2020  Melvenia Beam Kalandra Masters, PharmD Clinical Pharmacist [facility] [phone]    Prediabetes Eating Plan Prediabetes is a condition that causes blood sugar (glucose) levels to be higher than normal. This increases the risk for developing diabetes. In order to prevent diabetes from developing, your health care provider may recommend a diet and other lifestyle changes to help you:  Control your blood glucose levels.  Improve your cholesterol levels.  Manage your blood pressure. Your health care provider may recommend working with a diet and nutrition specialist (dietitian) to make a meal plan that is best for you. What are tips for following this plan? Lifestyle  Set weight loss goals with the help of your health care team. It is recommended that most people with prediabetes lose 7% of their current body weight.  Exercise for at least 30 minutes at least 5 days a week.  Attend a support group or seek ongoing support from a mental health counselor.  Take over-the-counter and  prescription medicines only as told by your health care provider. Reading food labels  Read food labels to check the amount of fat, salt (sodium), and sugar in prepackaged foods. Avoid foods that have: ? Saturated fats. ? Trans fats. ? Added sugars.  Avoid foods that have more than 300 milligrams (mg) of sodium per serving. Limit your daily sodium intake to less  than 2,300 mg each Matthew Joyce. Shopping  Avoid buying pre-made and processed foods. Cooking  Cook with olive oil. Do not use butter, lard, or ghee.  Bake, broil, grill, or boil foods. Avoid frying. Meal planning   Work with your dietitian to develop an eating plan that is right for you. This may include: ? Tracking how many calories you take in. Use a food diary, notebook, or mobile application to track what you eat at each meal. ? Using the glycemic index (GI) to plan your meals. The index tells you how quickly a food will raise your blood glucose. Choose low-GI foods. These foods take a longer time to raise blood glucose.  Consider following a Mediterranean diet. This diet includes: ? Several servings each Caroljean Monsivais of fresh fruits and vegetables. ? Eating fish at least twice a week. ? Several servings each Letroy Vazguez of whole grains, beans, nuts, and seeds. ? Using olive oil instead of other fats. ? Moderate alcohol consumption. ? Eating small amounts of red meat and whole-fat dairy.  If you have high blood pressure, you may need to limit your sodium intake or follow a diet such as the DASH eating plan. DASH is an eating plan that aims to lower high blood pressure. What foods are recommended? The items listed below may not be a complete list. Talk with your dietitian about what dietary choices are best for you. Grains Whole grains, such as whole-wheat or whole-grain breads, crackers, cereals, and pasta. Unsweetened oatmeal. Bulgur. Barley. Quinoa. Brown rice. Corn or whole-wheat flour tortillas or taco shells. Vegetables Lettuce. Spinach. Peas. Beets. Cauliflower. Cabbage. Broccoli. Carrots. Tomatoes. Squash. Eggplant. Herbs. Peppers. Onions. Cucumbers. Brussels sprouts. Fruits Berries. Bananas. Apples. Oranges. Grapes. Papaya. Mango. Pomegranate. Kiwi. Grapefruit. Cherries. Meats and other protein foods Seafood. Poultry without skin. Lean cuts of pork and beef. Tofu. Eggs. Nuts.  Beans. Dairy Low-fat or fat-free dairy products, such as yogurt, cottage cheese, and cheese. Beverages Water. Tea. Coffee. Sugar-free or diet soda. Seltzer water. Lowfat or no-fat milk. Milk alternatives, such as soy or almond milk. Fats and oils Olive oil. Canola oil. Sunflower oil. Grapeseed oil. Avocado. Walnuts. Sweets and desserts Sugar-free or low-fat pudding. Sugar-free or low-fat ice cream and other frozen treats. Seasoning and other foods Herbs. Sodium-free spices. Mustard. Relish. Low-fat, low-sugar ketchup. Low-fat, low-sugar barbecue sauce. Low-fat or fat-free mayonnaise. What foods are not recommended? The items listed below may not be a complete list. Talk with your dietitian about what dietary choices are best for you. Grains Refined white flour and flour products, such as bread, pasta, snack foods, and cereals. Vegetables Canned vegetables. Frozen vegetables with butter or cream sauce. Fruits Fruits canned with syrup. Meats and other protein foods Fatty cuts of meat. Poultry with skin. Breaded or fried meat. Processed meats. Dairy Full-fat yogurt, cheese, or milk. Beverages Sweetened drinks, such as sweet iced tea and soda. Fats and oils Butter. Lard. Ghee. Sweets and desserts Baked goods, such as cake, cupcakes, pastries, cookies, and cheesecake. Seasoning and other foods Spice mixes with added salt. Ketchup. Barbecue sauce. Mayonnaise. Summary  To prevent diabetes from developing, you may need  to make diet and other lifestyle changes to help control blood sugar, improve cholesterol levels, and manage your blood pressure.  Set weight loss goals with the help of your health care team. It is recommended that most people with prediabetes lose 7 percent of their current body weight.  Consider following a Mediterranean diet that includes plenty of fresh fruits and vegetables, whole grains, beans, nuts, seeds, fish, lean meat, low-fat dairy, and healthy oils. This  information is not intended to replace advice given to you by your health care provider. Make sure you discuss any questions you have with your health care provider. Document Revised: 03/25/2019 Document Reviewed: 02/04/2017 Elsevier Patient Education  2020 [employer].

## 2020-02-29 NOTE — Chronic Care Management (AMB) (Signed)
Chronic Care Management Pharmacy  Name: Matthew TRAUGHBER Sr.  MRN: [id number] DOB: [birth year]  Chief Complaint/ HPI  Matthew Romans Sr.,  69 y.o. , male presents for their Initial CCM visit with the clinical pharmacist via telephone due to COVID-19 Pandemic.  PCP : Colon Branch, MD  Their chronic conditions include: Pre-DM, HF, HTN, HLD, CAD, Gout, Pain, Dry Eye, Allergies  Office Visits: 06/09/19: Visit w/ [name] - Complete Physical Exam. No med changes noted  Consult Visit: 02/27/20: Cardio visit w/ [name] - CHF. Pt feeling well w/ no CP, SOB, edema, orthopnea, or PND. Pt has room to increase medical therapy. BP too soft for entresto.  Candesartan increased to 4mg  BID. Addendum: Pharmacy can only cover candesartan at a dose of once daily. Being prescribed 8mg  to use 1/2 tab (4mg ) BID. Pt was called to make aware of this change. VM left for pt. Labs ordered (BNP, BMP, CBC, EKG, ECHO in 3 months)  02/01/20: Hem/Onc visit w/ Dr. [name] - Metastatic carcinoma to a cervical lymph node. S/P chemo and radiation. Appears in remission. Needs to continue follow up with ENT and radiation oncology. To be seen on PRN basis with hem/onc.  01/19/20: Cardio visit w/ [name] - Pt with ICD. Received an ICD shock a few nights prior. Found to be in VFib and was successfully defibrillated. Will continue current meds. ICD working properly.  01/12/20: Radiation Oncology visit w/ [name] - No medication changes noted.  10/24/19: TEE  07/28/19: Colonoscopy - Normal prostate, grade II hemorrhoids, multiple diverticula. Otherwise normal, so specimens collected.    Medications: Outpatient Encounter Medications as of 02/29/2020  Medication Sig Note  . acetaminophen (TYLENOL) 325 MG tablet Take 650 mg by mouth every 6 (six) hours as needed (for pain.).   Matthew Joyce aspirin EC 81 MG tablet Take 81 mg by mouth at bedtime.   . candesartan (ATACAND) 8 MG tablet Take 1 tablet (8 mg total) by mouth daily.   .  carboxymethylcellulose (REFRESH PLUS) 0.5 % SOLN Place 1 drop into both eyes daily.    . cetirizine (ZYRTEC) 10 MG tablet Take 10 mg by mouth daily.     . Colchicine (MITIGARE) 0.6 MG CAPS Take 0.6 mg by mouth 2 (two) times daily as needed (gout flare).   . dapagliflozin propanediol (FARXIGA) 10 MG TABS tablet Take 10 mg by mouth daily before breakfast.   . furosemide (LASIX) 40 MG tablet TAKE 1 TABLET EVERY [name]   . meclizine (ANTIVERT) 12.5 MG tablet Take 12.5 mg by mouth as needed for dizziness. 02/29/2020: PRN has not had to use yet  . metoprolol succinate (TOPROL-XL) 25 MG 24 hr tablet Take 0.5 tablets (12.5 mg total) by mouth 2 (two) times daily.   . nitroGLYCERIN (NITROSTAT) 0.4 MG SL tablet Place 0.4 mg under the tongue every 5 (five) minutes as needed for chest pain.   . Omega-3 Fatty Acids (FISH OIL) 1200 MG CAPS Take 3,600 mg by mouth daily.   . rosuvastatin (CRESTOR) 40 MG tablet Take 1 tablet (40 mg total) by mouth at bedtime.   Matthew Joyce spironolactone (ALDACTONE) 25 MG tablet Take 0.5 tablets (12.5 mg total) by mouth daily.    No facility-administered encounter medications on file as of 02/29/2020.   Immunization History  Administered Date(s) Administered  . Influenza Split 10/10/2011, 10/11/2012  . Influenza Whole 11/18/2007, 09/27/2008, 09/27/2009, 08/20/2010  . Influenza, High Dose Seasonal PF 11/25/2016, 10/22/2017, 10/21/2018, 10/19/2019  . Influenza,inj,Quad PF,6+ Mos  10/13/2013, 10/24/2014, 11/20/2015  . PFIZER SARS-COV-2 Vaccination 02/03/2020, 02/22/2020  . Pneumococcal Conjugate-13 04/13/2014  . Pneumococcal Polysaccharide-23 03/07/2009, 11/20/2015  . Td 12/15/2006, 04/27/2017  . Zoster 04/11/2013  . Zoster Recombinat (Shingrix) 04/30/2018, 07/02/2018     Current Diagnosis/Assessment:  Goals Addressed            This Visit's Progress   . A1c goal <6.5%       -Pre-Diabetes a1c range is 5.7% to 6.4%. Your most recent a1c was 6.4% on 06/09/19.  -Usually the full  diabetes diagnosis is given when a1c reaches 6.5% or higher.     . LDL goal <70      . Limit the amount of carbohydrates eaten      . Pharmacy Care Plan       CARE PLAN ENTRY  Current Barriers:  . Chronic Disease Management support, education, and care coordination needs related to Pre-DM, HF, HTN, HLD, CAD, Gout, Pain, Dry Eye, Allergies  Pharmacist Clinical Goal(s):  Matthew Joyce A1c goal <6.5% . Blood pressure goal <130/80 but also >90/60 . LDL goal <70  Interventions: . Comprehensive medication review performed. . Limit the amount of carbohydrates eaten . Take candesartan as 4mg  twice daily (Use 1/2 tablet of new prescription of candesartan 8mg )  Patient Self Care Activities:  . Patient verbalizes understanding of plan to follow as described above, Self administers medications as prescribed, Calls pharmacy for medication refills, and Calls provider office for new concerns or questions  Initial goal documentation     . Remember take candesartan 8mg  as 1/2 tablet twice daily        Social Hx: Has a sister that is a Software engineer in [place]. Lived in [place] in the 70s.  Grew up in [place].  Married 49 years in May.  2 kids. 2 grandsons (age 54 and age 45)  Fills pill box up every Sunday morning. Takes about 10 minutes.  Pre-Diabetes   Recent Relevant Labs: Lab Results  Component Value Date/Time   HGBA1C 6.4 06/09/2019 08:25 AM   HGBA1C 5.7 06/03/2018 10:43 AM   MICROALBUR 0.2 08/20/2010 08:50 AM   MICROALBUR 0.2 03/02/2009 09:31 AM    Patient is currently controlled on the following medications: farxiga 10mg  daily (primarily for HF diagnosis)  Eats a fair amount of beans and corn.  We discussed: diet and exercise extensively  Plan -Limit the amount of carbs eaten -Continue current medications    Heart Failure   Type: Systolic  Last ejection fraction: 10/24/19 20-25% (echo ordered on 02/27/20) NYHA Class: II (slight limitation of activity) AHA HF Stage: B  (Heart disease present - no symptoms present)  Patient has failed these meds in past: None noted  Patient is currently controlled on the following medications: metoprolol succinate 12.5mg  BID, candesartan 4mg  BID, farxiga 10mg  daily, furosemide 40mg  daily  Followed by Cardio  Plan -Continue current medications  Hypertension   CMP Latest Ref Rng & Units 02/27/2020 10/20/2019 10/12/2019  Glucose 70 - 99 mg/dL 124(H) 126(H) 108(H)  BUN 8 - 23 mg/dL 12 12 17   Creatinine 0.61 - 1.24 mg/dL 0.88 0.93 0.86  Sodium 135 - 145 mmol/L 134(L) 136 135  Potassium 3.5 - 5.1 mmol/L 4.0 4.1 3.8  Chloride 98 - 111 mmol/L 96(L) 103 101  CO2 22 - 32 mmol/L 27 22 25   Calcium 8.9 - 10.3 mg/dL 9.2 9.5 9.5  Total Protein 6.0 - 8.3 g/dL - - -  Total Bilirubin 0.2 - 1.2 mg/dL - - -  Alkaline  Phos 39 - 117 U/L - - -  AST 0 - 37 U/L - - -  ALT 0 - 53 U/L - - -  GFR       >60   >60   >60  BP today is: 92/56 this am <130/80  Office blood pressures are  BP Readings from Last 3 Encounters:  02/27/20 (!) 100/52  01/19/20 102/62  11/16/19 112/70    Patient has failed these meds in the past: None noted  Patient is currently controlled on the following medications: candesartan 4mg  BID, spironolactone 25mg  daily, metoprolol succinate 25mg  1/2 tab BID  Patient checks BP at home daily  Patient home BP readings are ranging: 110-120s/55-70s  Just picked up candesartan from the pharmacy. Relayed addendum message about pharmacy filling 8mg  tabs. Need to make sure his candesartan is 4mg  BID or 8mg  1/2 BID  Last dizzy spell   Patient is very active. Does walking on the treadmill at 3.9 speed.   We discussed Monitoring BP if feeling dizzy or week  Plan -Continue current medications  -Take candesartan as 4mg  BID (use 1/2 tablet of 8mg  candesartan received from pharmacy)  Hyperlipidemia   Lipid Panel     Component Value Date/Time   CHOL 115 06/09/2019 0825   TRIG 66.0 06/09/2019 0825   TRIG 45  12/16/2006 0912   HDL 43.10 06/09/2019 0825   CHOLHDL 3 06/09/2019 0825   VLDL 13.2 06/09/2019 0825   LDLCALC 58 06/09/2019 0825     The ASCVD Risk score (Goff DC Jr., et al., 2013) failed to calculate for the following reasons:   The patient has a prior MI or stroke diagnosis   Patient has failed these meds in past: simvastatin (not effective), atorvastatin (lots of muscle aches) Patient is currently controlled on the following medications: rosuvastatin 40mg  daily  Plan -Continue current medications      CAD    Patient has failed these meds in past: None noted Patient is currently controlled on the following medications: aspirin 81mg  daily  Denies chest pain Multiple hearts with 4 bypass surgeries Had "events" where his ICD fired  LDL goal <70   Plan -Continue current medications   Gout    Patient has failed these meds in past: None noted  Patient is currently controlled on the following medications: colchicine 0.6mg  PRN  There was a medication he took before the colchicine that he felt worked faster?  Colchicine last used about 2 years ago   Plan -Continue current medications   Pain    Patient has failed these meds in past: None noted  Patient is currently controlled on the following medications: acetaminophen 325mg  #2 PRN  APAP - uses once every 3 months Pain occurs depending on how much activity he does   Plan -Continue current medications    Dry Eye    Patient has failed these meds in past: None noted  Patient is currently controlled on the following medications: refresh eye drops  Plan -Continue current medications   Allergies    Patient has failed these meds in past: None noted  Patient is currently controlled on the following medications: cetirizine 10mg  daily   Winter has runny nose.  Spring has sneeze attacks. That don't happen often or are that severe  Plan -Continue current medications   Miscellaneous Meds -meclizine  12.5mg  daily PRN but hasn't used yet

## 2020-03-14 ENCOUNTER — Other Ambulatory Visit (HOSPITAL_COMMUNITY): Payer: Self-pay | Admitting: Internal Medicine

## 2020-03-29 ENCOUNTER — Other Ambulatory Visit: Payer: Self-pay

## 2020-05-01 ENCOUNTER — Ambulatory Visit (INDEPENDENT_AMBULATORY_CARE_PROVIDER_SITE_OTHER): Payer: Medicare HMO | Admitting: *Deleted

## 2020-05-01 DIAGNOSIS — I4510 Unspecified right bundle-branch block: Secondary | ICD-10-CM

## 2020-05-01 DIAGNOSIS — I5022 Chronic systolic (congestive) heart failure: Secondary | ICD-10-CM

## 2020-05-01 LAB — CUP PACEART REMOTE DEVICE CHECK
Battery Remaining Longevity: 74 mo
Battery Remaining Percentage: 73 %
Battery Voltage: 2.98 V
Brady Statistic RV Percent Paced: 2.8 %
Date Time Interrogation Session: 20210518040026
HighPow Impedance: 53 Ohm
HighPow Impedance: 53 Ohm
Implantable Lead Implant Date: 20090618
Implantable Lead Location: 753860
Implantable Lead Model: 7121
Implantable Pulse Generator Implant Date: 20180220
Lead Channel Impedance Value: 400 Ohm
Lead Channel Pacing Threshold Amplitude: 1 V
Lead Channel Pacing Threshold Pulse Width: 0.8 ms
Lead Channel Sensing Intrinsic Amplitude: 4.5 mV
Lead Channel Setting Pacing Amplitude: 2.5 V
Lead Channel Setting Pacing Pulse Width: 0.8 ms
Lead Channel Setting Sensing Sensitivity: 0.5 mV
Pulse Gen Serial Number: 7406967

## 2020-05-02 NOTE — Progress Notes (Signed)
Remote ICD transmission.   

## 2020-05-24 ENCOUNTER — Other Ambulatory Visit (HOSPITAL_COMMUNITY): Payer: Self-pay | Admitting: Internal Medicine

## 2020-05-28 NOTE — Progress Notes (Signed)
Advanced Heart Failure Clinic Note   Date:  05/28/2020   ID:  Matthew Romans Sr., DOB Mar 02, 1951, MRN [id number]  Location: Home  Provider location: [facility] Type of Visit: Established patient  PCP:  Colon Branch, MD  Cardiologist:  No primary care provider on file. Primary HF: Matthew Joyce  Chief Complaint: Heart Failure follow-up   History of Present Illness:  Matthew Joyce is a 69 y.o. male with a history of a CAD S/P CABG in 2006 (LIMA -> LAD, SVG -> Ramus, SVG -> OM-2, SVG -> RCA), s/p ICD, ischemic MR, DM, hyperlipidemia, and throat cancer s/p chemo/XRT in 2018  Stress test 08/14/2016 with EF 31%, old infarct, no ischemia.   Developed CP and SOB in early October. Cath on 09/20/19. Coronary anatomy was stable but EF down to 20% on cath with 3+ MR Echo on 09/20/19 read as EF 25-30% with mod MR. (I reviewed echo and EF 20-25% and moderate to severe MR). Since that time has had CPX with only mild HF limitation.  TEE EF 20-25%. 3+MR and severe TR.   On 2/14 woke from sleep for unknown reasons. Found to have shock for VT/VF. Saw [name]   He is here for routine f/u. Says he feels ok. Still playing golf. Gets fatigued more easily. No CP or undue SOB. No edema. BP remains low. Walking in neighborhood does 3 miles in 48 mins.   Echo today (05/29/20): EF 20% mod to severe RV dysfunction severe central MR Personally reviewed   Cath 09/20/19   1. 3v CAD  2. Patent LIMA to LAD 3. Patent free RIMA to ramus 4. Occluded SVG to RCA with widely patent native RCA 5. Unable to locate SVG to OM2 mentioned in OP report. There is no graft marker for this graft and Ar root shot did not show any SVG to OM (suspect may not have been placed) 6. EF 20% with global HK and 3+ MR   Ao = 108/53 (72) LV = 109/19 RA = 9 RV = 60/10 PA = 63/23 (36) PCW = 22 (v = 35-40) Fick cardiac output/index = 4.5/2.4 PVR = 3.0 WU SVR 1310 Ao sat = 99% PA sat = 66%, 69%  CPX test  10/20/19   FVC 3.22 (81%)    FEV1 2.54 (84%)     FEV1/FVC 79 (103%)     MVV 92 (76%)     Resting HR: 44 Standing HR: 47 Peak HR: 107  (71% age predicted max HR)  BP rest: 92/56 Standing BP: 86/52 BP peak: 116/62  Peak VO2: 24.5 (88% predicted peak VO2)  VE/VCO2 slope: 37  OUES: 2.11  Peak RER: 1.01 O2pulse: 19  (127% predicted O2pulse)    TEE 10/24/19  1. Left ventricular ejection fraction 20-25%  2. Global right ventricle has moderately reduced systolic function.  3. Left atrial size was severely dilated.  4. No LAA clot.  5. Right atrial size was mild-moderately dilated.  6. The mitral valve is abnormal. Moderate mitral valve regurgitation.  7. Mildly restricted posterior MV leaflet with 3+ MR.  8. The tricuspid valve is normal in structure. Tricuspid valve regurgitation is severe.   Echo 07/2016 LVEF 30-35%, Mod MR, Mod LAE, Mildly dilated RV with mild reduced RV function, PA peak pressure 51 mm Hg.   Matthew D Cederberg Sr. denies symptoms worrisome for COVID 19.   Past Medical History:  Diagnosis Date   AICD (automatic cardioverter/defibrillator) present  6/09   St. Jude   Bradycardia    Use of beta blocker limited   CAD (coronary artery disease)    a- S/p cabg in march 2006 by Dr.Owen;  b. [name] 5/12: large IL scar from apex to base, no ischemia, EF 37%   Cancer (HCC)    on right side of neck   ED (erectile dysfunction)    Gout    Hyperlipidemia    Ischemic cardiomyopathy    a-Echocardiogram, Nov 2006, showed ejection fraction of 30-40% with mild to moderated mitral  regurgitation and mild aortic regurgitation b- Cardiac MRI, May 2007, EF of 44% with 50% scar involving  the inferolateral walls. No comment of mitral regurgitation. c- last echo  11/10: EF 30-35%  mod MR d- Nega T-Wave alternans testing in May of 2007 e- no ACE due to low BP;  f. CPX 3/11: normal   RBBB (right bundle branch block with left anterior fascicular block)    Systolic  CHF, chronic ([place])    Past Surgical History:  Procedure Laterality Date   CARDIAC DEFIBRILLATOR PLACEMENT  06/01/08   ICD   COLONOSCOPY     COLONOSCOPY WITH PROPOFOL N/A 07/28/2019   Procedure: COLONOSCOPY WITH PROPOFOL;  Surgeon: [name], MD;  Location: [facility];  Service: Endoscopy;  Laterality: N/A;   CORONARY ARTERY BYPASS GRAFT  02-2005   ICD GENERATOR CHANGEOUT N/A 02/03/2017   Procedure: ICD Generator Changeout;  Surgeon: [name], MD;  Location: [facility];  Service: Cardiovascular;  Laterality: N/A;   ORIF ANKLE FRACTURE  1987   left    RIGHT/LEFT HEART CATH AND CORONARY/GRAFT ANGIOGRAPHY N/A 09/20/2019   Procedure: RIGHT/LEFT HEART CATH AND CORONARY/GRAFT ANGIOGRAPHY;  Surgeon: [name], MD;  Location: [facility] [facility];  Service: Cardiovascular;  Laterality: N/A;   SHOULDER ARTHROSCOPY  02/2010   rt    TEE WITHOUT CARDIOVERSION N/A 10/24/2019   Procedure: TRANSESOPHAGEAL ECHOCARDIOGRAM (TEE);  Surgeon: [name], MD;  Location[facility] END[facility];  Service: Cardiovascular;  Laterality: N/A;   TONSILLECTOMY     as a child     Current Outpatient Medications  Medication Sig Dispense Refill   acetaminophen (TYLENOL) 325 MG tablet Take 650 mg by mouth every 6 (six) hours as needed (for pain.).     aspirin EC 81 MG tablet Take 81 mg by mouth at bedtime.     candesartan (ATACAND) 8 MG tablet Take 1 tablet (8 mg total) by mouth daily. 90 tablet 3   carboxymethylcellulose (REFRESH PLUS) 0.5 % SOLN Place 1 drop into both eyes daily.      cetirizine (ZYRTEC) 10 MG tablet Take 10 mg by mouth daily.       Colchicine (MITIGARE) 0.6 MG CAPS Take 0.6 mg by mouth 2 (two) times daily as needed (gout flare). 60 capsule 0   FARXIGA 10 MG TABS tablet TAKE 1 TABLET BY MOUTH BEFORE BREAKFAST 90 tablet 3   furosemide (LASIX) 40 MG tablet TAKE 1 TABLET EVERY DAY 90 tablet 1   meclizine (ANTIVERT) 12.5 MG tablet Take 12.5 mg by mouth as needed  for dizziness.     metoprolol succinate (TOPROL-XL) 25 MG 24 hr tablet Take 0.5 tablets (12.5 mg total) by mouth 2 (two) times daily. 90 tablet 3   nitroGLYCERIN (NITROSTAT) 0.4 MG SL tablet Place 1 tablet (0.4 mg total) under the tongue every 5 (five) minutes x 3 doses as needed for chest pain. 25 tablet 3   Omega-3  Fatty Acids (FISH OIL) 1200 MG CAPS Take 3,600 mg by mouth daily.     rosuvastatin (CRESTOR) 40 MG tablet Take 1 tablet (40 mg total) by mouth at bedtime. 90 tablet 3   spironolactone (ALDACTONE) 25 MG tablet Take 0.5 tablets (12.5 mg total) by mouth daily. 45 tablet 3   No current facility-administered medications for this encounter.    Allergies:   Atorvastatin, Sulfonamide derivatives, Tape, and Sulfamethoxazole   Social History:  The patient  reports that he quit smoking about 20 years ago. His smoking use included cigarettes. He smoked 0.10 packs per day. He has never used smokeless tobacco. He reports current alcohol use. He reports that he does not use drugs.   Family History:  The patient's family history includes Cardiomyopathy in his brother; Heart attack (age of onset: 50) in his father.   ROS:  Please see the history of present illness.   All other systems are personally reviewed and negative.   Vitals:   05/29/20 1101  BP: (!) 80/40  Pulse: (!) 49  SpO2: 100%  Weight: 76.2 kg (168 lb)    Exam:   General:  Well appearing. No resp difficulty HEENT: normal Neck: supple. no JVD. Carotids 2+ bilat; no bruits. No lymphadenopathy or thryomegaly appreciated. Cor: PMI nondisplaced. Regular rate & rhythm. Soft MR Lungs: clear Abdomen: soft, nontender, nondistended. No hepatosplenomegaly. No bruits or masses. Good bowel sounds. Extremities: no cyanosis, clubbing, rash, edema Neuro: alert & orientedx3, cranial nerves grossly intact. moves all 4 extremities w/o difficulty. Affect pleasant   Recent Labs: 06/09/2019: ALT 21 08/09/2019: TSH 5.87 02/27/2020: B  Natriuretic Peptide 306.7; BUN 12; Creatinine, Ser 0.88; Hemoglobin 13.9; Platelets 189; Potassium 4.0; Sodium 134  Personally reviewed   Wt Readings from Last 3 Encounters:  02/27/20 77 kg (169 lb 12.8 oz)  01/19/20 79.5 kg (175 lb 3.2 oz)  11/16/19 78.7 kg (173 lb 6.4 oz)      ASSESSMENT AND PLAN:  1. Acute on Chronic Systolic Heart Failure ICM ECHO 08/2013 EF 45-50%  Has St Jude ICD 2009 -  Echo 07/2016 LVEF 30-35%, Mod MR, Mod LAE, Mildly dilated RV with mild reduced RV function, PA peak pressure 51 mm Hg.  - TEE 11/20 Left ventricular ejection fraction 20-25% Moderately reduced RV function 3+ MR severe TR - Stable NYHA II - CPX 11/20 with preserved VO2 with moderately elevated slope  - Little BP room to titrate medical therapy aggressively. I suspect we may be eventually headed toward advanced therapies but age and throat cancer in 2018 may be barrier to transplant (metastatic to lymph node with unknown primary) - Continue Toprol XL 12.5 mg BID.  - On Atacand to 4 mg bid and see if he can tolerate. (BP too soft for Entresto) - Continue Farxiga 10  - Continue lasix - Reinforced fluid restriction to < 2 L daily, sodium restriction to less than 2000 mg daily, and the importance of daily weights.  - Blood Type O positive - Echo today reviewed personally. LV is markedly dilated with EF 15-20% severe MR. RV at least moderately reduced, - Continues to do quite well despite echo findings. I worry that options will be limited for him down the road. Will repeat CPX and discuss with [facility] transplant team. If VO2 is down. Will need to consider VAD. If not will review with structural team regarding MitraClip though LV may be too dilated - labs today 2. CAD S/P CABG 2006 - No s/s angina - Coronary  angio 10/20 with stable revascularization. - Continue aspirin and simvastatin 3. Hyperlipidemia -Continue Crestor. Goal LDL < 70  - Per PCP 4. RBBB - stable  5.  Shock for VF 06/09/16 - Recurrent  VT/VF 2/14. No recurrence since on ICD interrogation today - Follows with [name].  6. Throat Cancer - on R side. Squamous Cell Carcinoma with unclear primary.  - s/p chemo/XRT at [facility] in 2018 7. Severe mitral regurgitation - plan as above  Signed, Glori Bickers, MD  05/28/2020 7:50 PM  [facility] [place] [address] [facility] [place] [address] [phone] (office) [phone] (fax)

## 2020-05-29 ENCOUNTER — Ambulatory Visit (HOSPITAL_BASED_OUTPATIENT_CLINIC_OR_DEPARTMENT_OTHER)
Admission: RE | Admit: 2020-05-29 | Discharge: 2020-05-29 | Disposition: A | Discharge status: Home / Self Care | Payer: Medicare HMO | Source: Ambulatory Visit | Attending: Internal Medicine | Admitting: Internal Medicine

## 2020-05-29 ENCOUNTER — Ambulatory Visit (HOSPITAL_COMMUNITY)
Admission: RE | Admit: 2020-05-29 | Discharge: 2020-05-29 | Disposition: A | Discharge status: Home / Self Care | Payer: Medicare HMO | Source: Ambulatory Visit | Attending: Internal Medicine | Admitting: Internal Medicine

## 2020-05-29 ENCOUNTER — Other Ambulatory Visit: Payer: Self-pay

## 2020-05-29 ENCOUNTER — Encounter (HOSPITAL_COMMUNITY): Payer: Self-pay | Admitting: Internal Medicine

## 2020-05-29 VITALS — BP 80/40 | HR 49 | Wt 168.0 lb

## 2020-05-29 DIAGNOSIS — Z9221 Personal history of antineoplastic chemotherapy: Secondary | ICD-10-CM | POA: Insufficient documentation

## 2020-05-29 DIAGNOSIS — Z85819 Personal history of malignant neoplasm of unspecified site of lip, oral cavity, and pharynx: Secondary | ICD-10-CM | POA: Insufficient documentation

## 2020-05-29 DIAGNOSIS — Z87891 Personal history of nicotine dependence: Secondary | ICD-10-CM | POA: Insufficient documentation

## 2020-05-29 DIAGNOSIS — Z888 Allergy status to other drugs, medicaments and biological substances status: Secondary | ICD-10-CM | POA: Insufficient documentation

## 2020-05-29 DIAGNOSIS — I340 Nonrheumatic mitral (valve) insufficiency: Secondary | ICD-10-CM | POA: Diagnosis not present

## 2020-05-29 DIAGNOSIS — Z8249 Family history of ischemic heart disease and other diseases of the circulatory system: Secondary | ICD-10-CM | POA: Diagnosis not present

## 2020-05-29 DIAGNOSIS — Z923 Personal history of irradiation: Secondary | ICD-10-CM | POA: Diagnosis not present

## 2020-05-29 DIAGNOSIS — I2510 Atherosclerotic heart disease of native coronary artery without angina pectoris: Secondary | ICD-10-CM

## 2020-05-29 DIAGNOSIS — Z7984 Long term (current) use of oral hypoglycemic drugs: Secondary | ICD-10-CM | POA: Insufficient documentation

## 2020-05-29 DIAGNOSIS — E785 Hyperlipidemia, unspecified: Secondary | ICD-10-CM | POA: Insufficient documentation

## 2020-05-29 DIAGNOSIS — Z882 Allergy status to sulfonamides status: Secondary | ICD-10-CM | POA: Insufficient documentation

## 2020-05-29 DIAGNOSIS — Z7982 Long term (current) use of aspirin: Secondary | ICD-10-CM | POA: Diagnosis not present

## 2020-05-29 DIAGNOSIS — Z951 Presence of aortocoronary bypass graft: Secondary | ICD-10-CM | POA: Insufficient documentation

## 2020-05-29 DIAGNOSIS — I4510 Unspecified right bundle-branch block: Secondary | ICD-10-CM | POA: Insufficient documentation

## 2020-05-29 DIAGNOSIS — I081 Rheumatic disorders of both mitral and tricuspid valves: Secondary | ICD-10-CM | POA: Insufficient documentation

## 2020-05-29 DIAGNOSIS — E119 Type 2 diabetes mellitus without complications: Secondary | ICD-10-CM | POA: Diagnosis not present

## 2020-05-29 DIAGNOSIS — Z95810 Presence of automatic (implantable) cardiac defibrillator: Secondary | ICD-10-CM | POA: Diagnosis not present

## 2020-05-29 DIAGNOSIS — Z79899 Other long term (current) drug therapy: Secondary | ICD-10-CM | POA: Diagnosis not present

## 2020-05-29 DIAGNOSIS — I255 Ischemic cardiomyopathy: Secondary | ICD-10-CM | POA: Diagnosis not present

## 2020-05-29 DIAGNOSIS — I5022 Chronic systolic (congestive) heart failure: Secondary | ICD-10-CM

## 2020-05-29 DIAGNOSIS — R001 Bradycardia, unspecified: Secondary | ICD-10-CM | POA: Insufficient documentation

## 2020-05-29 DIAGNOSIS — M109 Gout, unspecified: Secondary | ICD-10-CM | POA: Diagnosis not present

## 2020-05-29 LAB — BASIC METABOLIC PANEL
Anion gap: 8 (ref 5–15)
BUN: 13 mg/dL (ref 8–23)
CO2: 29 mmol/L (ref 22–32)
Calcium: 9.6 mg/dL (ref 8.9–10.3)
Chloride: 100 mmol/L (ref 98–111)
Creatinine, Ser: 0.92 mg/dL (ref 0.61–1.24)
GFR calc Af Amer: >60 mL/min (ref >60)
GFR calc non Af Amer: >60 mL/min (ref >60)
Glucose, Bld: 110 mg/dL — ABNORMAL HIGH (ref 70–99)
Potassium: 4.1 mmol/L (ref 3.5–5.1)
Sodium: 137 mmol/L (ref 135–145)

## 2020-05-29 LAB — BRAIN NATRIURETIC PEPTIDE: B Natriuretic Peptide: 285.3 pg/mL — ABNORMAL HIGH (ref 0.0–100.0)

## 2020-05-29 NOTE — Patient Instructions (Signed)
Labs done today, your results will be available in MyChart, we will contact you for abnormal readings.  Your physician has recommended that you have a cardiopulmonary stress test (CPX). CPX testing is a non-invasive measurement of heart and lung function. It replaces a traditional treadmill stress test. This type of test provides a tremendous amount of information that relates not only to your present condition but also for future outcomes. This test combines measurements of you ventilation, respiratory gas exchange in the lungs, electrocardiogram (EKG), blood pressure and physical response before, during, and following an exercise protocol.  Your physician recommends that you schedule a follow-up appointment in: 1-2 months with Virtual appointment  If you have any questions or concerns before your next appointment please send [place] a message through [place] or call our office at [phone].    TO LEAVE A MESSAGE FOR THE NURSE SELECT OPTION 2, PLEASE LEAVE A MESSAGE INCLUDING: . YOUR NAME . DATE OF BIRTH . CALL BACK NUMBER . REASON FOR CALL**this is important as we prioritize the call backs  [place] AS LONG AS YOU CALL BEFORE 4:00 PM  At the [facility], you and your health needs are our priority. As part of our continuing mission to provide you with exceptional heart care, we have created designated Provider Care Teams. These Care Teams include your primary Cardiologist (physician) and Advanced Practice Providers (APPs- Physician Assistants and Nurse Practitioners) who all work together to provide you with the care you need, when you need it.   You may see any of the following providers on your designated Care Team at your next follow up: Marland Kitchen Dr Glori Bickers . Dr Loralie Champagne . [name], NP . Lyda Jester, PA . Audry Riles, PharmD   Please be sure to bring in all your medications bottles to every appointment.

## 2020-05-29 NOTE — Progress Notes (Signed)
  Echocardiogram 2D Echocardiogram has been performed.  Darlina Sicilian M 05/29/2020, 10:52 AM

## 2020-06-14 ENCOUNTER — Ambulatory Visit (INDEPENDENT_AMBULATORY_CARE_PROVIDER_SITE_OTHER): Payer: Medicare HMO | Admitting: Internal Medicine

## 2020-06-14 ENCOUNTER — Other Ambulatory Visit: Payer: Self-pay

## 2020-06-14 ENCOUNTER — Encounter: Payer: Self-pay | Admitting: Internal Medicine

## 2020-06-14 VITALS — BP 110/60 | HR 43 | Temp 96.9°F | Resp 18 | Ht 69.0 in | Wt 166.5 lb

## 2020-06-14 DIAGNOSIS — R7989 Other specified abnormal findings of blood chemistry: Secondary | ICD-10-CM | POA: Diagnosis not present

## 2020-06-14 DIAGNOSIS — Z0000 Encounter for general adult medical examination without abnormal findings: Secondary | ICD-10-CM

## 2020-06-14 DIAGNOSIS — R739 Hyperglycemia, unspecified: Secondary | ICD-10-CM | POA: Diagnosis not present

## 2020-06-14 DIAGNOSIS — E785 Hyperlipidemia, unspecified: Secondary | ICD-10-CM | POA: Diagnosis not present

## 2020-06-14 DIAGNOSIS — E038 Other specified hypothyroidism: Secondary | ICD-10-CM

## 2020-06-14 DIAGNOSIS — E039 Hypothyroidism, unspecified: Secondary | ICD-10-CM

## 2020-06-14 LAB — LIPID PANEL
Cholesterol: 126 mg/dL (ref 0–200)
HDL: 59 mg/dL (ref >39.00)
LDL Cholesterol: 56 mg/dL (ref 0–99)
NonHDL: 66.5
Total CHOL/HDL Ratio: 2
Triglycerides: 53 mg/dL (ref 0.0–149.0)
VLDL: 10.6 mg/dL (ref 0.0–40.0)

## 2020-06-14 LAB — T3, FREE: T3, Free: 2.7 pg/mL (ref 2.3–4.2)

## 2020-06-14 LAB — T4, FREE: Free T4: 0.91 ng/dL (ref 0.60–1.60)

## 2020-06-14 LAB — HEMOGLOBIN A1C: Hgb A1c MFr Bld: 6.2 % (ref 4.6–6.5)

## 2020-06-14 LAB — AST: AST: 21 U/L (ref 0–37)

## 2020-06-14 LAB — ALT: ALT: 18 U/L (ref 0–53)

## 2020-06-14 LAB — TSH: TSH: 5.49 u[IU]/mL — ABNORMAL HIGH (ref 0.35–4.50)

## 2020-06-14 NOTE — Assessment & Plan Note (Signed)
--  Td 04/2017; pneumonia shot 2009 and 2016, Prevnar 2015; zostavax 2014; s/p shingrex x 2 -  S/p Covid shots x 2  - rec  flu shot q year --CCS: 01-2008: Cscope, tics, no polyps; + IFOB  06/2019, cscope 07/2019 no polyps, + hemorrhoids, no repeat CCS --Prostate cancer screening: DRE -PSA wnl 2020 --Diet and exercise discussed  --labs:  AST, ALT, FLP, A1c, TFTs

## 2020-06-14 NOTE — Progress Notes (Signed)
Pre visit review using our clinic review tool, if applicable. No additional management support is needed unless otherwise documented below in the visit note. 

## 2020-06-14 NOTE — Progress Notes (Signed)
Subjective:    Patient ID: Matthew Romans Sr., male    DOB: 1951/02/14, 69 y.o.   MRN: [id number]  DOS:  06/14/2020 Type of visit - description: cpx Since the last office visit he feels well. Note from cardiology reviewed. EF is significantly decreased, he remains active, plays golf, do house chores without chest pain, no difficulty breathing. Has developed tinnitus, saw ENT  BP Readings from Last 3 Encounters:  06/14/20 110/60  05/29/20 (!) 80/40  02/27/20 (!) 100/52    Review of Systems  Other than above, a 14 point review of systems is negative    Past Medical History:  Diagnosis Date  . AICD (automatic cardioverter/defibrillator) present 6/09   St. Jude  . Bradycardia    Use of beta blocker limited  . CAD (coronary artery disease)    a- S/p cabg in march 2006 by Dr.Owen;  b. myoview 5/12: large IL scar from apex to base, no ischemia, EF 37%  . Cancer ([place])    on right side of neck  . ED (erectile dysfunction)   . Gout   . Hyperlipidemia   . Ischemic cardiomyopathy    a-Echocardiogram, Nov 2006, showed ejection fraction of 30-40% with mild to moderated mitral  regurgitation and mild aortic regurgitation b- Cardiac MRI, May 2007, EF of 44% with 50% scar involving  the inferolateral walls. No comment of mitral regurgitation. c- last echo  11/10: EF 30-35%  mod MR d- Nega T-Wave alternans testing in May of 2007 e- no ACE due to low BP;  f. CPX 3/11: normal  . 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 Systolic CHF, chronic ([place])     Past Surgical History:  Procedure Laterality Date  . CARDIAC DEFIBRILLATOR PLACEMENT  06/01/08   ICD  . COLONOSCOPY    . COLONOSCOPY WITH PROPOFOL N/A 07/28/2019   Procedure: COLONOSCOPY WITH PROPOFOL;  Surgeon: [name], MD;  Location: [facility];  Service: Endoscopy;  Laterality: N/A;  . CORONARY ARTERY BYPASS GRAFT  02-2005  . ICD GENERATOR CHANGEOUT N/A 02/03/2017   Procedure: ICD Generator  Changeout;  Surgeon: [name], MD;  Location: [facility];  Service: Cardiovascular;  Laterality: N/A;  . ORIF [place]   left   . RIGHT/LEFT HEART CATH AND CORONARY/GRAFT ANGIOGRAPHY N/A 09/20/2019   Procedure: RIGHT/LEFT HEART CATH AND CORONARY/GRAFT ANGIOGRAPHY;  Surgeon: [name], MD;  Location: [facility];  Service: Cardiovascular;  Laterality: N/A;  . SHOULDER ARTHROSCOPY  02/2010   rt   . TEE WITHOUT CARDIOVERSION N/A 10/24/2019   Procedure: TRANSESOPHAGEAL ECHOCARDIOGRAM (TEE);  Surgeon: [name], MD;  Location: [facility] ENDOSCOPY;  Service: Cardiovascular;  Laterality: N/A;  . TONSILLECTOMY     as a child    Allergies as of 06/14/2020      Reactions   Atorvastatin     myalgia, Muscle aching   Sulfonamide Derivatives    Rash and hot flashes   Tape Other (See Comments)   Rips skin- use PAPER tape   Sulfamethoxazole Rash, Other (See Comments)   Hot flashes      Medication List       Accurate as of June 14, 2020  9:14 PM. If you have any questions, ask your nurse or doctor.        acetaminophen 325 MG tablet Commonly known as: TYLENOL Take 650 mg by mouth every 6 (six) hours as needed (for pain.).  aspirin EC 81 MG tablet Take 81 mg by mouth at bedtime.   candesartan 4 MG tablet Commonly known as: ATACAND Take 4 mg by mouth in the morning and at bedtime.   carboxymethylcellulose 0.5 % Soln Commonly known as: REFRESH PLUS Place 1 drop into both eyes daily.   cetirizine 10 MG tablet Commonly known as: ZYRTEC Take 10 mg by mouth daily.   Colchicine 0.6 MG Caps Commonly known as: Mitigare Take 0.6 mg by mouth 2 (two) times daily as needed (gout flare).   Farxiga 10 MG Tabs tablet Generic drug: dapagliflozin propanediol TAKE 1 TABLET BY MOUTH BEFORE BREAKFAST   Fish Oil 1200 MG Caps Take 3,600 mg by mouth daily.   furosemide 40 MG tablet Commonly known as: LASIX TAKE 1 TABLET EVERY DAY   meclizine 12.5 MG  tablet Commonly known as: ANTIVERT Take 12.5 mg by mouth as needed for dizziness.   metoprolol succinate 25 MG 24 hr tablet Commonly known as: TOPROL-XL Take 0.5 tablets (12.5 mg total) by mouth 2 (two) times daily.   nitroGLYCERIN 0.4 MG SL tablet Commonly known as: NITROSTAT Place 1 tablet (0.4 mg total) under the tongue every 5 (five) minutes x 3 doses as needed for chest pain.   rosuvastatin 40 MG tablet Commonly known as: CRESTOR Take 1 tablet (40 mg total) by mouth at bedtime.   spironolactone 25 MG tablet Commonly known as: ALDACTONE Take 0.5 tablets (12.5 mg total) by mouth daily.          Objective:   Physical Exam BP 110/60 (BP Location: Right Arm)   Pulse (!) 43   Temp (!) 96.9 F (36.1 C) (Temporal)   Resp 18   Ht 5\' 9"  (1.753 m)   Wt 166 lb 8 oz (75.5 kg)   SpO2 100%   BMI 24.59 kg/m  General: Well developed, NAD, BMI noted Neck: No  thyromegaly  HEENT:  Normocephalic . Face symmetric, atraumatic Lungs:  CTA B Normal respiratory effort, no intercostal retractions, no accessory muscle use. Heart: Bradycardic, trace murmur Abdomen:  Not distended, soft, non-tender. No rebound or rigidity.   Lower extremities: no pretibial edema bilaterally  Skin: Exposed areas without rash. Not pale. Not jaundice Neurologic:  alert & oriented X3.  Speech normal, gait appropriate for age and unassisted Strength symmetric and appropriate for age.  Psych: Cognition and judgment appear intact.  Cooperative with normal attention span and concentration.  Behavior appropriate. No anxious or depressed appearing.     Assessment      Assessment Prediabetes Hyperlipidemia Gout E.D. 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 S/p chemo  x 6, XRT x 35; treatment ended 01/2018   PLAN: Here for CPX Prediabetes: Has a healthy lifestyle, check A1c Hyperlipidemia: Continue rosuvastatin, check LFTs, FLP. Cardiovascular: Closely follow-up by cardiology, EF has significantly decreased to around 20%, he is on multiple medications, remains asymptomatic and very active.  He is careful follow-up by the cardiology team. Tinnitus: Reports tinnitus, saw ENT, Rx observation for now Blood pressure: Upon arrival was low, I repeated right arm: 110/60, at home is typically around that reading.  He is asymptomatic. Slightly increased TSH: Check TFTs. RTC 1 year    This visit occurred during the SARS-CoV-2 public health emergency.  Safety  protocols were in place, including screening questions prior to the visit, additional usage of staff PPE, and extensive cleaning of exam room while observing appropriate contact time as indicated for disinfecting solutions.  

## 2020-06-14 NOTE — Patient Instructions (Addendum)
Please schedule Medicare Wellness with Glenard Haring.    Check the  blood pressure regularly BP GOAL is between 110/65 and  135/85. If it is consistently higher or lower, let me know  GO TO THE LAB : Get the blood work     [place], [place] back for a physical exam in 1 year

## 2020-06-14 NOTE — Assessment & Plan Note (Signed)
Here for CPX Prediabetes: Has a healthy lifestyle, check A1c Hyperlipidemia: Continue rosuvastatin, check LFTs, FLP. Cardiovascular: Closely follow-up by cardiology, EF has significantly decreased to around 20%, he is on multiple medications, remains asymptomatic and very active.  He is careful follow-up by the cardiology team. Tinnitus: Reports tinnitus, saw ENT, Rx observation for now Blood pressure: Upon arrival was low, I repeated right arm: 110/60, at home is typically around that reading.  He is asymptomatic. Slightly increased TSH: Check TFTs. RTC 1 year

## 2020-06-15 NOTE — Addendum Note (Signed)
Addended byDamita Dunnings D on: 06/15/2020 07:46 AM   Modules accepted: Orders

## 2020-06-17 ENCOUNTER — Other Ambulatory Visit (HOSPITAL_COMMUNITY): Payer: Self-pay | Admitting: Internal Medicine

## 2020-06-28 ENCOUNTER — Other Ambulatory Visit: Payer: Self-pay | Admitting: Internal Medicine

## 2020-06-28 ENCOUNTER — Other Ambulatory Visit (HOSPITAL_COMMUNITY)
Admission: RE | Admit: 2020-06-28 | Discharge: 2020-06-28 | Disposition: A | Discharge status: Home / Self Care | Payer: Medicare HMO | Source: Ambulatory Visit | Attending: Internal Medicine | Admitting: Internal Medicine

## 2020-06-28 DIAGNOSIS — Z20822 Contact with and (suspected) exposure to covid-19: Secondary | ICD-10-CM | POA: Diagnosis not present

## 2020-06-28 DIAGNOSIS — Z01812 Encounter for preprocedural laboratory examination: Secondary | ICD-10-CM | POA: Insufficient documentation

## 2020-06-28 LAB — SARS CORONAVIRUS 2 (TAT 6-24 HRS): SARS Coronavirus 2: NEGATIVE

## 2020-07-03 ENCOUNTER — Ambulatory Visit (HOSPITAL_COMMUNITY): Payer: Medicare HMO | Attending: Internal Medicine

## 2020-07-03 ENCOUNTER — Other Ambulatory Visit: Payer: Self-pay

## 2020-07-03 ENCOUNTER — Other Ambulatory Visit (HOSPITAL_COMMUNITY): Payer: Self-pay | Admitting: *Deleted

## 2020-07-03 DIAGNOSIS — I5022 Chronic systolic (congestive) heart failure: Secondary | ICD-10-CM | POA: Diagnosis not present

## 2020-07-11 DIAGNOSIS — R69 Illness, unspecified: Secondary | ICD-10-CM | POA: Diagnosis not present

## 2020-07-12 ENCOUNTER — Ambulatory Visit (HOSPITAL_COMMUNITY)
Admission: RE | Admit: 2020-07-12 | Discharge: 2020-07-12 | Disposition: A | Discharge status: Home / Self Care | Payer: Medicare HMO | Source: Ambulatory Visit | Attending: Internal Medicine | Admitting: Internal Medicine

## 2020-07-12 ENCOUNTER — Other Ambulatory Visit: Payer: Self-pay

## 2020-07-12 DIAGNOSIS — I340 Nonrheumatic mitral (valve) insufficiency: Secondary | ICD-10-CM

## 2020-07-12 DIAGNOSIS — I5022 Chronic systolic (congestive) heart failure: Secondary | ICD-10-CM

## 2020-07-12 DIAGNOSIS — I4510 Unspecified right bundle-branch block: Secondary | ICD-10-CM

## 2020-07-12 DIAGNOSIS — I2510 Atherosclerotic heart disease of native coronary artery without angina pectoris: Secondary | ICD-10-CM | POA: Diagnosis not present

## 2020-07-12 DIAGNOSIS — C801 Malignant (primary) neoplasm, unspecified: Secondary | ICD-10-CM | POA: Diagnosis not present

## 2020-07-12 DIAGNOSIS — R69 Illness, unspecified: Secondary | ICD-10-CM | POA: Diagnosis not present

## 2020-07-12 DIAGNOSIS — C770 Secondary and unspecified malignant neoplasm of lymph nodes of head, face and neck: Secondary | ICD-10-CM | POA: Diagnosis not present

## 2020-07-12 NOTE — Progress Notes (Signed)
Heart Failure TeleHealth Note  Due to national recommendations of social distancing due to [place] 19, Audio/video telehealth visit is felt to be most appropriate for this patient at this time.  See MyChart message from today for patient consent regarding telehealth for [facility] The patient was identified personally using two identifiers.   Date:  07/12/2020   ID:  Matthew Romans Sr., DOB [birth year], MRN [id number]  Location: Home  Provider location: [facility] Type of Visit: Established patient  PCP:  Colon Branch, MD  Cardiologist:  No primary care provider on file. Primary HF: Tanav Orsak  Chief Complaint: Heart Failure follow-up   History of Present Illness:  Matthew Joyce is a68 y.o.malewith a history of a CAD S/P CABG in 2006 (LIMA -> LAD, SVG -> Ramus, SVG -> OM-2, SVG -> RCA), s/p ICD, ischemic MR, DM, hyperlipidemia, and throat cancer s/p chemo/XRT in 2018  Stress test 08/14/2016 with EF 31%, old infarct, no ischemia.  Developed CP and SOB in early October. Cath on 09/20/19. Coronary anatomy was stable but EF down to 20% on cath with 3+ MR Echo on 09/20/19 read as EF 25-30% with mod MR. (I reviewed echo and EF 20-25% and moderate to severe MR). Since that time has had CPX with only mild HF limitation.  TEE EF 20-25%. 3+MR and severe TR.   On 2/14 woke from sleep for unknown reasons. Found to have shock for VT/VF. Saw [name]   He presents via audio/video conferencing for a telehealth visit today. Says he is doing ok. Played 18 holes of golf in the heat. Did ok. No CP or SOB as long as he takes his time. Occasionally gets lightheaded. SBP ~100. No edema, orthopnea or PND.  Echo 05/29/20: EF 20% mod to severe RV dysfunction severe central MR Personally reviewed   Cath 09/20/19   1. 3v CAD  2. Patent LIMA to LAD 3. Patent free RIMA to ramus 4. Occluded SVG to RCA with widely patent native RCA 5. Unable to locate SVG to OM2 mentioned in  OP report. There is no graft marker for this graft and Ar root shot did not show any SVG to OM (suspect may not have been placed) 6. EF 20% with global HK and 3+ MR   Ao = 108/53 (72) LV = 109/19 RA = 9 RV = 60/10 PA = 63/23 (36) PCW = 22 (v = 35-40) Fick cardiac output/index = 4.5/2.4 PVR = 3.0 WU SVR 1310 Ao sat = 99% PA sat = 66%, 69%  CPX 06/2020  FVC 3.54 (85%)    FEV1 2.81 (89%)     FEV1/FVC 79 (104%)     MVV 104 (82%)      Resting HR: 43 Standing HR:47 Peak HR: 104  (69% age predicted max HR)  BP rest: 84/60 Standing BP: 76/58 BP peak: 108/60  Peak VO2: 27.5 (99% predicted peak VO2)  VE/VCO2 slope: 30  OUES: 1.99  Peak RER: 0.95  Ventilatory Threshold: 23.7 (85% predicted or measured peak VO2)  Peak RR 39  Peak Ventilation: 63.0  VE/MVV: 61%  PETCO2 at peak: 33  O2pulse: 19  (146% predicted O2pulse)    CPX test 10/20/19   FVC 3.22 (81%)    FEV1 2.54 (84%)     FEV1/FVC 79 (103%)     MVV 92 (76%)     Resting HR: 44 Standing HR: 47 Peak HR: 107  (71% age predicted max HR)  BP  rest: 92/56 Standing BP: 86/52 BP peak: 116/62  Peak VO2: 24.5 (88% predicted peak VO2)  VE/VCO2 slope: 37  OUES: 2.11  Peak RER: 1.01 O2pulse: 19  (127% predicted O2pulse)    TEE 10/24/19 1. Left ventricular ejection fraction 20-25% 2. Global right ventricle has moderately reduced systolic function. 3. Left atrial size was severely dilated. 4. No LAA clot. 5. Right atrial size was mild-moderately dilated. 6. The mitral valve is abnormal. Moderate mitral valve regurgitation. 7. Mildly restricted posterior MV leaflet with 3+ MR. 8. The tricuspid valve is normal in structure. Tricuspid valve regurgitation is severe.   Echo 07/2016 LVEF 30-35%, Mod MR, Mod LAE, Mildly dilated RV with mild reduced RV function, PA peak pressure 51 mm Hg.     Matthew D Huge Sr. denies symptoms worrisome for COVID 19.   Past Medical History:    Diagnosis Date  . AICD (automatic cardioverter/defibrillator) present 6/09   St. Jude  . Bradycardia    Use of beta blocker limited  . CAD (coronary artery disease)    a- S/p cabg in march 2006 by Dr.Owen;  b. myoview 5/12: large IL scar from apex to base, no ischemia, EF 37%  . Cancer ([place])    on right side of neck  . ED (erectile dysfunction)   . Gout   . Hyperlipidemia   . Ischemic cardiomyopathy    a-Echocardiogram, Nov 2006, showed ejection fraction of 30-40% with mild to moderated mitral  regurgitation and mild aortic regurgitation b- Cardiac MRI, May 2007, EF of 44% with 50% scar involving  the inferolateral walls. No comment of mitral regurgitation. c- last echo  11/10: EF 30-35%  mod MR d- Nega T-Wave alternans testing in May of 2007 e- no ACE due to low BP;  f. CPX 3/11: normal  . 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 Systolic CHF, chronic ([place])    Past Surgical History:  Procedure Laterality Date  . CARDIAC DEFIBRILLATOR PLACEMENT  06/01/08   ICD  . COLONOSCOPY    . COLONOSCOPY WITH PROPOFOL N/A 07/28/2019   Procedure: COLONOSCOPY WITH PROPOFOL;  Surgeon: [name], MD;  Location: [facility];  Service: Endoscopy;  Laterality: N/A;  . CORONARY ARTERY BYPASS GRAFT  02-2005  . ICD GENERATOR CHANGEOUT N/A 02/03/2017   Procedure: ICD Generator Changeout;  Surgeon: [name], MD;  Location: [facility];  Service: Cardiovascular;  Laterality: N/A;  . ORIF [place]   left   . RIGHT/LEFT HEART CATH AND CORONARY/GRAFT ANGIOGRAPHY N/A 09/20/2019   Procedure: RIGHT/LEFT HEART CATH AND CORONARY/GRAFT ANGIOGRAPHY;  Surgeon: [name], MD;  Location: [facility];  Service: Cardiovascular;  Laterality: N/A;  . SHOULDER ARTHROSCOPY  02/2010   rt   . TEE WITHOUT CARDIOVERSION N/A 10/24/2019   Procedure: TRANSESOPHAGEAL ECHOCARDIOGRAM (TEE);  Surgeon: [name], MD;  Location: [facility] ENDOSCOPY;  Service:  Cardiovascular;  Laterality: N/A;  . TONSILLECTOMY     as a child     Current Outpatient Medications  Medication Sig Dispense Refill  . acetaminophen (TYLENOL) 325 MG tablet Take 650 mg by mouth every 6 (six) hours as needed (for pain.).    Marland Kitchen aspirin EC 81 MG tablet Take 81 mg by mouth at bedtime.    . candesartan (ATACAND) 4 MG tablet Take 4 mg by mouth in the morning and at bedtime.    . carboxymethylcellulose (REFRESH PLUS) 0.5 % SOLN Place 1 drop  into both eyes daily.     . cetirizine (ZYRTEC) 10 MG tablet Take 10 mg by mouth daily.      . Colchicine (MITIGARE) 0.6 MG CAPS Take 0.6 mg by mouth 2 (two) times daily as needed (gout flare). (Patient not taking: Reported on 05/29/2020) 60 capsule 0  . FARXIGA 10 MG TABS tablet TAKE 1 TABLET BY MOUTH BEFORE BREAKFAST 90 tablet 3  . furosemide (LASIX) 40 MG tablet TAKE 1 TABLET EVERY DAY 90 tablet 3  . meclizine (ANTIVERT) 12.5 MG tablet Take 12.5 mg by mouth as needed for dizziness. (Patient not taking: Reported on 05/29/2020)    . metoprolol succinate (TOPROL-XL) 25 MG 24 hr tablet Take 0.5 tablets (12.5 mg total) by mouth 2 (two) times daily. 90 tablet 3  . nitroGLYCERIN (NITROSTAT) 0.4 MG SL tablet Place 1 tablet (0.4 mg total) under the tongue every 5 (five) minutes x 3 doses as needed for chest pain. (Patient not taking: Reported on 05/29/2020) 25 tablet 3  . Omega-3 Fatty Acids (FISH OIL) 1200 MG CAPS Take 3,600 mg by mouth daily.    . rosuvastatin (CRESTOR) 40 MG tablet Take 1 tablet (40 mg total) by mouth at bedtime. 90 tablet 3  . spironolactone (ALDACTONE) 25 MG tablet Take 0.5 tablets (12.5 mg total) by mouth daily. 45 tablet 3   No current facility-administered medications for this encounter.    Allergies:   Atorvastatin, Sulfonamide derivatives, Tape, and Sulfamethoxazole   Social History:  The patient  reports that he quit smoking about 20 years ago. His smoking use included cigarettes. He smoked 0.10 packs per day. He has never  used smokeless tobacco. He reports current alcohol use. He reports that he does not use drugs.   Family History:  The patient's family history includes Cardiomyopathy in his brother; Heart attack (age of onset: 108) in his father.   ROS:  Please see the history of present illness.   All other systems are personally reviewed and negative.   Exam:  (Video/Tele Health Call; Exam is subjective and or/visual.) General:  Speaks in full sentences. No resp difficulty. Lungs: Normal respiratory effort with conversation.  Abdomen: Non-distended per patient report Extremities: Pt denies edema. Neuro: Alert & oriented x 3.   Recent Labs: 02/27/2020: Hemoglobin 13.9; Platelets 189 05/29/2020: B Natriuretic Peptide 285.3; BUN 13; Creatinine, Ser 0.92; Potassium 4.1; Sodium 137 06/14/2020: ALT 18; TSH 5.49  Personally reviewed   Wt Readings from Last 3 Encounters:  06/14/20 75.5 kg (166 lb 8 oz)  05/29/20 76.2 kg (168 lb)  02/27/20 77 kg (169 lb 12.8 oz)      ASSESSMENT AND PLAN:  1. Chronic Systolic Heart Failure ICM ECHO 08/2013 EF 45-50% Has St Jude ICD 2009 - Echo 07/2016 LVEF 30-35%, Mod MR, Mod LAE, Mildly dilated RV with mild reduced RV function, PA peak pressure 51 mm Hg.  - TEE 11/20 Left ventricular ejection fraction 20-25% Moderately reduced RV function 3+ MR severe TR - Stable NYHA II. Volume status ok - CPX 11/20 with preserved VO2 with moderately elevated slope - CPX 7/21 Peak VO2: 27.5 (99% predicted peak VO2)  VE/VCO2 slope: 30. RER 0.95 - Continues to do quite well despite significant biventricular dysfunction and severe MR/TR. He is in a very difficult spot. I worry that at some point in the relatively near future his exercise capacity may fall off quickly but currently he is not candidate for transplant or VAD. I have reviewed his case with structural team and  they feel he likely would not benefit from MitraClip (more like MitraFR patient)  - Continue Toprol XL 12.5 mg BID.    -Continue candesartan 4 mg bid (BP too soft for Entresto) - Continue Farxiga 10  - Continue spiro 12.5  - Continue lasix 40 daily - Blood Type O positive - Echo 6/21 LV is markedly dilated with EF 15-20% severe MR. RV at least moderately reduced. Severe TR 2. CAD S/P CABG 2006 -No s/s angina - Coronary angio 10/20 with stable revascularization. -Continue aspirin and simvastatin 3. Hyperlipidemia -Continue Crestor. Goal LDL < 70  - Per PCP 4. RBBB - stable  5.Shock for VF 06/09/16 - Recurrent VT/VF 2/14. No recurrence since on ICD interrogation today - Follows with [name].  6. Throat Cancer - on R side. Squamous Cell Carcinoma with unclear primary.  - s/p chemo/XRT at [facility] in 2018 (Finished treatments in 2/19) - cancer check-up today and cancer free.  7. Severe mitral regurgitation - plan as above. Echo reviewed by Structural Team felt not to be candidate for MitraClip  COVID screen The patient does not have any symptoms that suggest any further testing/ screening at this time.  Social distancing reinforced today.  Recommended follow-up:  As above  Relevant cardiac medications were reviewed at length with the patient today.   The patient does not have concerns regarding their medications at this time.   The following changes were made today:  As above  Today, I have spent 19  minutes with the patient with telehealth technology discussing the above issues .    Signed, Glori Bickers, MD  07/12/2020 4:31 PM  [facility] [address] [facility] [place] [address] [phone] (office) [phone] (fax)

## 2020-07-24 DIAGNOSIS — R69 Illness, unspecified: Secondary | ICD-10-CM | POA: Diagnosis not present

## 2020-07-31 ENCOUNTER — Ambulatory Visit (INDEPENDENT_AMBULATORY_CARE_PROVIDER_SITE_OTHER): Payer: Medicare HMO | Admitting: *Deleted

## 2020-07-31 DIAGNOSIS — I428 Other cardiomyopathies: Secondary | ICD-10-CM

## 2020-07-31 DIAGNOSIS — I429 Cardiomyopathy, unspecified: Secondary | ICD-10-CM

## 2020-08-01 LAB — CUP PACEART REMOTE DEVICE CHECK
Battery Remaining Longevity: 73 mo
Battery Remaining Percentage: 72 %
Battery Voltage: 2.96 V
Brady Statistic RV Percent Paced: 2.8 %
Date Time Interrogation Session: 20210818014653
HighPow Impedance: 52 Ohm
HighPow Impedance: 52 Ohm
Implantable Lead Implant Date: 20090618
Implantable Lead Location: 753860
Implantable Lead Model: 7121
Implantable Pulse Generator Implant Date: 20180220
Lead Channel Impedance Value: 400 Ohm
Lead Channel Pacing Threshold Amplitude: 1 V
Lead Channel Pacing Threshold Pulse Width: 0.8 ms
Lead Channel Sensing Intrinsic Amplitude: 3.5 mV
Lead Channel Setting Pacing Amplitude: 2.5 V
Lead Channel Setting Pacing Pulse Width: 0.8 ms
Lead Channel Setting Sensing Sensitivity: 0.5 mV
Pulse Gen Serial Number: 7406967

## 2020-08-02 NOTE — Progress Notes (Signed)
Remote ICD transmission.   

## 2020-08-23 ENCOUNTER — Telehealth: Payer: Self-pay

## 2020-08-23 NOTE — Telephone Encounter (Signed)
[facility] alert received 08/23/20 for 31 NSVT arrhythmias (longest 12 seconds), two SVT arrhythmias longest 24 seconds) and one VT event (20 seconds, w/ max of 200 bpm) that was treated with ATP. All episodes occurred yesterday 07/22/20.  Patient reports he is feeling good today. States one day within the past week (unknown date), he felt like he was having a difficult time catching his breath with exertion. Reports of 6/10 chest tightness during episodes.  Denies fluid retention issues. Reports compliant with medications including Toprol- XL 25 mg BID, Lasix 40 mg daily, aldactone 12.5 mg daily.    DMV driving restrictions reviewed with patient.  Shock plan reviewed with patient.   Routing to [name] for review and recommendations.

## 2020-08-23 NOTE — Telephone Encounter (Signed)
Looks like atrial fib to me. GT

## 2020-08-24 NOTE — Telephone Encounter (Signed)
Discussed with [name]. Episode appears AF w/RVR. Per [name], driving restrictions do not apply. Plan to have pt come in to see EP APP within the next few weeks to discuss [place].  Spoke with pt to advise of Dr. Tanna Furry recommendations. Pt verbalizes understanding and accepted appointment with Matthew Churn, NP, on 08/30/20 at 8:40am. Pt denies additional questions at this time.

## 2020-08-28 ENCOUNTER — Ambulatory Visit: Payer: Medicare HMO

## 2020-08-28 DIAGNOSIS — E785 Hyperlipidemia, unspecified: Secondary | ICD-10-CM

## 2020-08-28 DIAGNOSIS — I509 Heart failure, unspecified: Secondary | ICD-10-CM

## 2020-08-28 DIAGNOSIS — R739 Hyperglycemia, unspecified: Secondary | ICD-10-CM

## 2020-08-28 NOTE — Patient Instructions (Signed)
Visit Information  Goals Addressed            This Visit's Progress   . Chronic Care Management Pharmacy Care Plan       CARE PLAN ENTRY (see longitudinal plan of care for additional care plan information)  Current Barriers:  . Chronic Disease Management support, education, and care coordination needs related to Pre-DM, HF, HTN, HLD, CAD, Gout, Pain, Dry Eye, Allergies   Hypertension Screening BP Readings from Last 3 Encounters:  06/14/20 110/60  05/29/20 (!) 80/40  02/27/20 (!) 100/52   . Pharmacist Clinical Goal(s): o Over the next 180 days, patient will work with PharmD and providers to maintain BP goal <130/80 . Current regimen:   Candesartan 4mg  BID  Spironolactone 25mg  daily  Metoprolol succinate 25mg  1/2 tab BID . Patient self care activities - Over the next 180 days, patient will: o Maintain hypertension medication regimen.  Hyperlipidemia Lab Results  Component Value Date/Time   LDLCALC 56 06/14/2020 08:36 AM   . Pharmacist Clinical Goal(s): o Over the next 180 days, patient will work with PharmD and providers to maintain LDL goal < 70 . Current regimen:  o Rosuvastatin 40mg  daily . Patient self care activities - Over the next 180 days, patient will: o Maintain cholesterol medication regimen.   Pre-Diabetes Lab Results  Component Value Date/Time   HGBA1C 6.2 06/14/2020 08:36 AM   HGBA1C 6.4 06/09/2019 08:25 AM   . Pharmacist Clinical Goal(s): o Over the next 180 days, patient will work with PharmD and providers to maintain A1c goal <6.5% . Current regimen:  o Farxiga 10mg  daily (primarily for HF diagnosis) . Interventions: o Discussed diet and exercise . Patient self care activities - Over the next 180 days, patient will: o Maintain a1c <6.5%  Heart Failure . Pharmacist Clinical Goal(s) o Over the next 180 days, patient will work with PharmD and providers to reduce symptoms of heart failure . Current regimen:   Metoprolol succinate 12.5mg   BID  Candesartan 4mg  BID  Farxiga 10mg  daily  Furosemide 40mg  daily . Interventions: o Discussed importance of daily weights and when weight gain is concerning - More than 3lbs in 1 [place]  - More than 5lbs in 1 week  - Contact cardio, primary care, or pharmacy team . Patient self care activities - Over the next 180 days, patient will: o Weigh daily o Maintain heart failure medication regimen  Medication management . Pharmacist Clinical Goal(s): o Over the next 180 days, patient will work with PharmD and providers to maintain optimal medication adherence . Current [facility] . Interventions o Comprehensive medication review performed. o Continue current medication management strategy . Patient self care activities - Over the next 180 days, patient will: o Focus on medication adherence by filling and taking medications appropriately o Take medications as prescribed o Report any questions or concerns to PharmD and/or provider(s)  Please see past updates related to this goal by clicking on the "Past Updates" button in the selected goal        The patient verbalized understanding of instructions provided today and agreed to receive a mailed copy of patient instruction and/or educational materials.  Telephone follow up appointment with pharmacy team member scheduled for: 02/25/2021  Melvenia Beam Kasy Iannacone, PharmD Clinical Pharmacist [facility] [phone]

## 2020-08-28 NOTE — Chronic Care Management (AMB) (Signed)
Chronic Care Management Pharmacy  Name: Matthew BUTCH Sr.  MRN: [id number] DOB: 07/08/1951  Chief Complaint/ HPI  Matthew Romans Sr.,  69 y.o. , male presents for their Follow-Up CCM visit with the clinical pharmacist via telephone due to COVID-19 Pandemic.  PCP : Colon Branch, MD  Their chronic conditions include: Pre-DM, HF, HLD, CAD, Gout, Pain, Dry Eye, Allergies  Office Visits: 06/14/20: Visit w/ [name] - Close followup with cardio noting reduced EF. Pt reported tinnitus - observation for now. No med changes noted  Consult Visit: 08/30/20: Cardio message that patient is to discuss [facility] options with Chanetta Marshall, NP on 08/30/20  07/12/20: Cardio visit w/ Dr. Haroldine Laws - "Continues to do quite well despite significant biventricular dysfunction and severe MR/TR. He is in a very difficult spot. I worry that at some point in the relatively near future his exercise capacity may fall off quickly but currently he is not candidate for transplant or VAD. I have reviewed his case with structural team and they feel he likely would not benefit from MitraClip (more like MitraFR patient)  - Continue Toprol XL 12.5 mg BID.  -Continue candesartan 4 mg bid (BP too soft for Entresto) - Continue Farxiga 10  - Continue spiro 12.5  - Continue lasix 40 daily - Blood Type O positive - Echo 6/21 LV is markedly dilated with EF 15-20% severe MR. RV at least moderately reduced. Severe TR"   Medications: Outpatient Encounter Medications as of 08/28/2020  Medication Sig Note  . acetaminophen (TYLENOL) 325 MG tablet Take 650 mg by mouth every 6 (six) hours as needed (for pain.).   Marland Kitchen aspirin EC 81 MG tablet Take 81 mg by mouth at bedtime.   . candesartan (ATACAND) 4 MG tablet Take 4 mg by mouth in the morning and at bedtime.   . carboxymethylcellulose (REFRESH PLUS) 0.5 % SOLN Place 1 drop into both eyes daily.    . cetirizine (ZYRTEC) 10 MG tablet Take 10 mg by mouth daily.     . Colchicine (MITIGARE) 0.6  MG CAPS Take 0.6 mg by mouth 2 (two) times daily as needed (gout flare). (Patient not taking: Reported on 05/29/2020) 05/29/2020: Has on hand, have not needed  . FARXIGA 10 MG TABS tablet TAKE 1 TABLET BY MOUTH BEFORE BREAKFAST   . furosemide (LASIX) 40 MG tablet TAKE 1 TABLET EVERY Nishawn Rotan   . meclizine (ANTIVERT) 12.5 MG tablet Take 12.5 mg by mouth as needed for dizziness. (Patient not taking: Reported on 05/29/2020) 02/29/2020: PRN has not had to use yet  . metoprolol succinate (TOPROL-XL) 25 MG 24 hr tablet Take 0.5 tablets (12.5 mg total) by mouth 2 (two) times daily.   . nitroGLYCERIN (NITROSTAT) 0.4 MG SL tablet Place 1 tablet (0.4 mg total) under the tongue every 5 (five) minutes x 3 doses as needed for chest pain. (Patient not taking: Reported on 05/29/2020)   . Omega-3 Fatty Acids (FISH OIL) 1200 MG CAPS Take 3,600 mg by mouth daily.   . rosuvastatin (CRESTOR) 40 MG tablet Take 1 tablet (40 mg total) by mouth at bedtime.   Marland Kitchen spironolactone (ALDACTONE) 25 MG tablet Take 0.5 tablets (12.5 mg total) by mouth daily.    No facility-administered encounter medications on file as of 08/28/2020.   SDOH Screenings   Alcohol Screen:   . Last Alcohol Screening Score (AUDIT): Not on file  Depression (PHQ2-9): Low Risk   . PHQ-2 Score: 0  Financial Resource Strain: Low Risk   .  Difficulty of Paying Living Expenses: Not very hard  Food Insecurity:   . Worried About Charity fundraiser in the Last Year: Not on file  . Ran Out of Food in the Last Year: Not on file  Housing:   . Last Housing Risk Score: Not on file  Physical Activity:   . Days of Exercise per Week: Not on file  . Minutes of Exercise per Session: Not on file  Social Connections:   . Frequency of Communication with Friends and Family: Not on file  . Frequency of Social Gatherings with Friends and Family: Not on file  . Attends Religious Services: Not on file  . Active Member of Clubs or Organizations: Not on file  . Attends Theatre manager Meetings: Not on file  . Marital Status: Not on file  Stress:   . Feeling of Stress : Not on file  Tobacco Use: Medium Risk  . Smoking Tobacco Use: Former Smoker  . Smokeless Tobacco Use: Never Used  Transportation Needs:   . Film/video editor (Medical): Not on file  . Lack of Transportation (Non-Medical): Not on file     Current Diagnosis/Assessment:  Goals Addressed            This Visit's Progress   . Chronic Care Management Pharmacy Care Plan       CARE PLAN ENTRY (see longitudinal plan of care for additional care plan information)  Current Barriers:  . Chronic Disease Management support, education, and care coordination needs related to Pre-DM, HF, HTN, HLD, CAD, Gout, Pain, Dry Eye, Allergies   Hypertension Screening BP Readings from Last 3 Encounters:  06/14/20 110/60  05/29/20 (!) 80/40  02/27/20 (!) 100/52   . Pharmacist Clinical Goal(s): o Over the next 180 days, patient will work with PharmD and providers to maintain BP goal <130/80 . Current regimen:   Candesartan 4mg  BID  Spironolactone 25mg  daily  Metoprolol succinate 25mg  1/2 tab BID . Patient self care activities - Over the next 180 days, patient will: o Maintain hypertension medication regimen.  Hyperlipidemia Lab Results  Component Value Date/Time   LDLCALC 56 06/14/2020 08:36 AM   . Pharmacist Clinical Goal(s): o Over the next 180 days, patient will work with PharmD and providers to maintain LDL goal < 70 . Current regimen:  o Rosuvastatin 40mg  daily . Patient self care activities - Over the next 180 days, patient will: o Maintain cholesterol medication regimen.   Pre-Diabetes Lab Results  Component Value Date/Time   HGBA1C 6.2 06/14/2020 08:36 AM   HGBA1C 6.4 06/09/2019 08:25 AM   . Pharmacist Clinical Goal(s): o Over the next 180 days, patient will work with PharmD and providers to maintain A1c goal <6.5% . Current regimen:  o Farxiga 10mg  daily (primarily for HF  diagnosis) . Interventions: o Discussed diet and exercise . Patient self care activities - Over the next 180 days, patient will: o Maintain a1c <6.5%  Heart Failure . Pharmacist Clinical Goal(s) o Over the next 180 days, patient will work with PharmD and providers to reduce symptoms of heart failure . Current regimen:   Metoprolol succinate 12.5mg  BID  Candesartan 4mg  BID  Farxiga 10mg  daily  Furosemide 40mg  daily . Interventions: o Discussed importance of daily weights and when weight gain is concerning - More than 3lbs in 1 Tamey Wanek  - More than 5lbs in 1 week  - Contact cardio, primary care, or pharmacy team . Patient self care activities - Over the next 180 days,  patient will: o Weigh daily o Maintain heart failure medication regimen  Medication management . Pharmacist Clinical Goal(s): o Over the next 180 days, patient will work with PharmD and providers to maintain optimal medication adherence . Current pharmacy: [facility] . Interventions o Comprehensive medication review performed. o Continue current medication management strategy . Patient self care activities - Over the next 180 days, patient will: o Focus on medication adherence by filling and taking medications appropriately o Take medications as prescribed o Report any questions or concerns to PharmD and/or provider(s)  Please see past updates related to this goal by clicking on the "Past Updates" button in the selected goal       Social Hx: Has a sister that is a Software engineer in [place]. Lived in [place] in the 70s.  Grew up in [place].  Married 49 years in May.  2 kids. 2 grandsons (age 48 and age 64)  Fills pill box up every Sunday morning. Takes about 10 minutes.  Pre-Diabetes   Recent Relevant Labs: Lab Results  Component Value Date/Time   HGBA1C 6.2 06/14/2020 08:36 AM   HGBA1C 6.4 06/09/2019 08:25 AM   MICROALBUR 0.2 08/20/2010 08:50 AM   MICROALBUR 0.2 03/02/2009 09:31 AM    Patient is  currently controlled on the following medications:   Farxiga 10mg  daily (primarily for HF diagnosis)  Eats a fair amount of beans and corn.  We discussed: diet and exercise extensively   Update 08/28/20 A1c stable  Plan -Limit the amount of carbs eaten -Continue current medications    Heart Failure   Type: Systolic  Last ejection fraction: 05/29/20 20% mod to severe RV dysfunction NYHA Class: II (slight limitation of activity) AHA HF Stage: B (Heart disease present - no symptoms present)  Patient has failed these meds in past: None noted  Patient is currently controlled on the following medications:   Metoprolol succinate 12.5mg  BID  Candesartan 4mg  BID  Farxiga 10mg  daily  Furosemide 40mg  daily  Followed by Cardio  Update 08/28/20 SOB? Yes, with exertion Edema? No  Weighing Daily? Yes - stays around 165-169lbs  We discussed the implication of significant weight gain. Greater than 3lbs in one daily greater than 5lbs in one week  Plan -Continue current medications  Hypertension   BP goal <130/80  CMP Latest Ref Rng & Units 06/14/2020 05/29/2020 02/27/2020  Glucose 70 - 99 mg/dL - 110(H) 124(H)  BUN 8 - 23 mg/dL - 13 12  Creatinine 0.61 - 1.24 mg/dL - 0.92 0.88  Sodium 135 - 145 mmol/L - 137 134(L)  Potassium 3.5 - 5.1 mmol/L - 4.1 4.0  Chloride 98 - 111 mmol/L - 100 96(L)  CO2 22 - 32 mmol/L - 29 27  Calcium 8.9 - 10.3 mg/dL - 9.6 9.2  Total Protein 6.0 - 8.3 g/dL - - -  Total Bilirubin 0.2 - 1.2 mg/dL - - -  Alkaline Phos 39 - 117 U/L - - -  AST 0 - 37 U/L 21 - -  ALT 0 - 53 U/L 18 - -   Kidney Function Lab Results  Component Value Date/Time   CREATININE 0.92 05/29/2020 11:40 AM   CREATININE 0.88 02/27/2020 11:07 AM   CREATININE 0.75 11/02/2017 04:33 PM   GFR 87.03 06/09/2019 08:25 AM   GFRNONAA >60 05/29/2020 11:40 AM   GFRAA >60 05/29/2020 11:40 AM   K 4.1 05/29/2020 11:40 AM   K 4.0 02/27/2020 11:07 AM   Office blood pressures are  BP  Readings from Last  3 Encounters:  06/14/20 110/60  05/29/20 (!) 80/40  02/27/20 (!) 100/52    Patient has failed these meds in the past: None noted  Patient is currently controlled on the following medications:   Candesartan 4mg  BID  Spironolactone 25mg  daily  Metoprolol succinate 25mg  1/2 tab BID  Patient checks BP at home daily  Patient home BP readings are ranging: 110-120s/55-70s  Just picked up candesartan from the pharmacy. Relayed addendum message about pharmacy filling 8mg  tabs. Need to make sure his candesartan is 4mg  BID or 8mg  1/2 BID  Last dizzy spell   Patient is very active. Does walking on the treadmill at 3.9 speed.   We discussed Monitoring BP if feeling dizzy or week   Update 08/28/20 Playing golf 3-4 days per week No HA, but does report ringing in his ears. He has spoken about it with multiple providers, but there isn't much to be done outside of hearing aids Dizziness? Had a few days with the heat and humidity Chest Pain? Not recently  Discussed making sure he is hydrated.  Plan -Continue current medications   Hyperlipidemia   LDL goal <70  Lipid Panel     Component Value Date/Time   CHOL 126 06/14/2020 0836   TRIG 53.0 06/14/2020 0836   TRIG 45 12/16/2006 0912   HDL 59.00 06/14/2020 0836   CHOLHDL 2 06/14/2020 0836   VLDL 10.6 06/14/2020 0836   LDLCALC 56 06/14/2020 0836     The ASCVD Risk score (Goff DC Jr., et al., 2013) failed to calculate for the following reasons:   The patient has a prior MI or stroke diagnosis   Patient has failed these meds in past: simvastatin (not effective), atorvastatin (lots of muscle aches) Patient is currently controlled on the following medications:   Rosuvastatin 40mg  daily  Update 08/28/20 LDL stable and at goal  Plan -Continue current medications     Miscellaneous Meds -meclizine 12.5mg  daily PRN but hasn't used yet  De Blanch, PharmD Clinical Pharmacist [facility]  [facility] [phone]

## 2020-08-29 NOTE — Progress Notes (Signed)
Electrophysiology Office Note Date: 08/30/2020  ID:  Matthew Romans Sr., DOB 01-03-1951, MRN [id number]  PCP: Colon Branch, MD Primary Cardiologist: [facility] Electrophysiologist: Lovena Le  CC: Routine ICD follow-up  Matthew Romans Sr. is a 69 y.o. male seen today for Dr Lovena Le.  He presents today for routine electrophysiology followup.  Since last being seen in our clinic, the patient reports doing relatively well.  He has noticed some palpitations as well as increased shortness of breath.  He denies chest pain, palpitations, dyspnea, PND, orthopnea, nausea, vomiting, dizziness, syncope, edema, weight gain, or early satiety.  He has not had ICD shocks.   Recent ICD remote demonstrated likely AF with RVR with inappropriate ATP delivered.  Device History: STJ single chamber ICD implanted 2018 for ICM History of appropriate therapy: yes - appropriate HV therapy 2021 History of AAD therapy: No   Past Medical History:  Diagnosis Date  . AICD (automatic cardioverter/defibrillator) present 6/09   St. Jude  . Bradycardia    Use of beta blocker limited  . CAD (coronary artery disease)    a- S/p cabg in march 2006 by Dr.Owen;  b. myoview 5/12: large IL scar from apex to base, no ischemia, EF 37%  . Cancer ([place])    on right side of neck  . ED (erectile dysfunction)   . Gout   . Hyperlipidemia   . Ischemic cardiomyopathy    a-Echocardiogram, Nov 2006, showed ejection fraction of 30-40% with mild to moderated mitral  regurgitation and mild aortic regurgitation b- Cardiac MRI, May 2007, EF of 44% with 50% scar involving  the inferolateral walls. No comment of mitral regurgitation. c- last echo  11/10: EF 30-35%  mod MR d- Nega T-Wave alternans testing in May of 2007 e- no ACE due to low BP;  f. CPX 3/11: normal  . 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 Systolic CHF, chronic ([place])    Past Surgical History:  Procedure Laterality Date  . CARDIAC  DEFIBRILLATOR PLACEMENT  06/01/08   ICD  . COLONOSCOPY    . COLONOSCOPY WITH PROPOFOL N/A 07/28/2019   Procedure: COLONOSCOPY WITH PROPOFOL;  Surgeon: [name], MD;  Location: [facility] ENDOSCOPY;  Service: Endoscopy;  Laterality: N/A;  . CORONARY ARTERY BYPASS GRAFT  02-2005  . ICD GENERATOR CHANGEOUT N/A 02/03/2017   Procedure: ICD Generator Changeout;  Surgeon: [name], MD;  Location: [facility];  Service: Cardiovascular;  Laterality: N/A;  . ORIF [place]   left   . RIGHT/LEFT HEART CATH AND CORONARY/GRAFT ANGIOGRAPHY N/A 09/20/2019   Procedure: RIGHT/LEFT HEART CATH AND CORONARY/GRAFT ANGIOGRAPHY;  Surgeon: [name], MD;  Location: [facility] CV LAB;  Service: Cardiovascular;  Laterality: N/A;  . SHOULDER ARTHROSCOPY  02/2010   rt   . TEE WITHOUT CARDIOVERSION N/A 10/24/2019   Procedure: TRANSESOPHAGEAL ECHOCARDIOGRAM (TEE);  Surgeon: [name], MD;  Location[facility] ENDOSCOPY;  Service: Cardiovascular;  Laterality: N/A;  . TONSILLECTOMY     as a child    Current Outpatient Medications  Medication Sig Dispense Refill  . acetaminophen (TYLENOL) 325 MG tablet Take 650 mg by mouth every 6 (six) hours as needed (for pain.).    Marland Kitchen aspirin EC 81 MG tablet Take 81 mg by mouth at bedtime.    . candesartan (ATACAND) 4 MG tablet Take 4 mg by mouth in the morning and at bedtime.    . carboxymethylcellulose (REFRESH PLUS)  0.5 % SOLN Place 1 drop into both eyes daily.     . cetirizine (ZYRTEC) 10 MG tablet Take 10 mg by mouth daily.      . Colchicine (MITIGARE) 0.6 MG CAPS Take 0.6 mg by mouth 2 (two) times daily as needed (gout flare). 60 capsule 0  . FARXIGA 10 MG TABS tablet TAKE 1 TABLET BY MOUTH BEFORE BREAKFAST 90 tablet 3  . furosemide (LASIX) 40 MG tablet TAKE 1 TABLET EVERY DAY 90 tablet 3  . meclizine (ANTIVERT) 12.5 MG tablet Take 12.5 mg by mouth as needed for dizziness.     . metoprolol succinate (TOPROL-XL) 25 MG 24 hr tablet Take 0.5 tablets  (12.5 mg total) by mouth 2 (two) times daily. 90 tablet 3  . nitroGLYCERIN (NITROSTAT) 0.4 MG SL tablet Place 1 tablet (0.4 mg total) under the tongue every 5 (five) minutes x 3 doses as needed for chest pain. 25 tablet 3  . Omega-3 Fatty Acids (FISH OIL) 1200 MG CAPS Take 3,600 mg by mouth daily.    . rosuvastatin (CRESTOR) 40 MG tablet Take 1 tablet (40 mg total) by mouth at bedtime. 90 tablet 3  . spironolactone (ALDACTONE) 25 MG tablet Take 0.5 tablets (12.5 mg total) by mouth daily. 45 tablet 3   No current facility-administered medications for this visit.    Allergies:   Atorvastatin, Sulfonamide derivatives, Tape, and Sulfamethoxazole   Social History: Social History   Socioeconomic History  . Marital status: Married    Spouse name: Not on file  . Number of children: 2  . Years of education: Not on file  . Highest education level: Not on file  Occupational History  . 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 Smoking status: Former Smoker    Packs/day: 0.10    Types: Cigarettes    Quit date: 12/16/1999    Years since quitting: 20.7  . Smokeless tobacco: Never Used  Vaping Use  . Vaping Use: Never used  Substance and Sexual Activity  . Alcohol use: Yes    Comment: socially  . Drug use: No  . Sexual activity: Not on file  Other Topics Concern  . Not on file  Social History Narrative   Retired and happy about it    household- pt and wife   2 adult children in [place] also has grandchildren   Former but not current smoker, occasional alcohol no drug use   Social Determinants of Radio broadcast assistant Strain: Low Risk   . Difficulty of Paying Living Expenses: Not very hard  Food Insecurity:   . Worried About Charity fundraiser in the Last Year: Not on file  . Ran Out of Food in the Last Year: Not on file  Transportation Needs:   . Lack of Transportation (Medical): Not  on file  . Lack of Transportation (Non-Medical): Not on file  Physical Activity:   . Days of Exercise per Week: Not on file  . Minutes of Exercise per Session: Not on file  Stress:   . Feeling of Stress : Not on file  Social Connections:   . Frequency of Communication with Friends and Family: Not on file  . Frequency of Social Gatherings with Friends and Family: Not on file  . Attends Religious Services: Not on file  . Active Member of Clubs or Organizations: Not on file  . Attends Archivist  Meetings: Not on file  . Marital Status: Not on file  Intimate Partner Violence:   . Fear of Current or Ex-Partner: Not on file  . Emotionally Abused: Not on file  . Physically Abused: Not on file  . Sexually Abused: Not on file    Family History: Family History  Problem Relation Age of Onset  . Heart attack Father 89  . Cardiomyopathy Brother   . Colon cancer Neg Hx   . Prostate cancer Neg Hx   . Diabetes Neg Hx     Review of Systems: All other systems reviewed and are otherwise negative except as noted above.   Physical Exam: VS:  BP 90/64   Pulse (!) 54   Ht 5\' 9"  (1.753 m)   Wt 170 lb 3.2 oz (77.2 kg)   SpO2 97%   BMI 25.13 kg/m  , BMI Body mass index is 25.13 kg/m.  GEN- The patient is elderly appearing, alert and oriented x 3 today.   HEENT: normocephalic, atraumatic; sclera clear, conjunctiva pink; hearing intact; oropharynx clear; neck supple  Lungs- Clear to ausculation bilaterally, normal work of breathing.  No wheezes, rales, rhonchi Heart- Irregular rate and rhythm  GI- soft, non-tender, non-distended, bowel sounds present  Extremities- no clubbing, cyanosis, or edema  MS- no significant deformity or atrophy Skin- warm and dry, no rash or lesion; ICD pocket well healed Psych- euthymic mood, full affect Neuro- strength and sensation are intact  ICD interrogation- reviewed in detail today,  See PACEART report  EKG:  EKG is ordered today. The ekg  ordered today shows atrial fibrillation, rate 66  Recent Labs: 02/27/2020: Hemoglobin 13.9; Platelets 189 05/29/2020: B Natriuretic Peptide 285.3; BUN 13; Creatinine, Ser 0.92; Potassium 4.1; Sodium 137 06/14/2020: ALT 18; TSH 5.49   Wt Readings from Last 3 Encounters:  08/30/20 170 lb 3.2 oz (77.2 kg)  06/14/20 166 lb 8 oz (75.5 kg)  05/29/20 168 lb (76.2 kg)     Other studies Reviewed: Additional studies/ records that were reviewed today include: Dr Tanna Furry office notes   Assessment and Plan:  1.  Chronic systolic dysfunction euvolemic today Stable on an appropriate medical regimen Normal ICD function See Pace Art report No changes today  2.  VF No recurrent ventricular arrhythmias Episode recently likely inappropriate therapy for AF with RVR VT-1 zone reprogrammed with longer detection intervals to hopefully decrease chance of inappropriate therapy. With previous appropriate therapy for VT, I am hesitant to increase rate cut off today  3.  Paroxysmal atrial fibrillation Unable to quantify burden by device (single chamber) - he likely has been in persistent AF since beginning of March per device trends CHADS2VASC is 3 - stop ASA and start Eliquis 5mg  bid today We discussed treatment options for AF including rate and rhythm control. With symptoms, I think we should pursue rhythm control. Will schedule DCCV after 3 weeks of [place]. He is aware to call for worsening symptoms before then and we can convert to TEE/DCCV Discussed above with Dr Haroldine Laws today - if he fails rhythm control off AAD therapy (likely), Tikosyn would be a reasonable option for AAD therapy with baseline bradycardia All above reviewed with patient today  4.  Severe MR Not a candidate for MitraClip per structural team    Current medicines are reviewed at length with the patient today.   The patient does not have concerns regarding his medicines.  The following changes were made today:  Stop ASA, start Eliquis  5mg  bid  Labs/ tests ordered today include:  No orders of the defined types were placed in this encounter.    Disposition:   Follow up with DCCV in 3 weeks with Dr Haroldine Laws, Dr Lovena Le 3 months    Signed, Chanetta Marshall, NP 08/30/2020 9:18 AM  [facility] [address] [place] [address] [phone] (office) [phone] (fax)

## 2020-08-30 ENCOUNTER — Encounter: Payer: Self-pay | Admitting: Nurse Practitioner

## 2020-08-30 ENCOUNTER — Other Ambulatory Visit: Payer: Self-pay

## 2020-08-30 ENCOUNTER — Ambulatory Visit: Payer: Medicare HMO | Admitting: Nurse Practitioner

## 2020-08-30 VITALS — BP 90/64 | HR 54 | Ht 69.0 in | Wt 170.2 lb

## 2020-08-30 DIAGNOSIS — I340 Nonrheumatic mitral (valve) insufficiency: Secondary | ICD-10-CM

## 2020-08-30 DIAGNOSIS — I472 Ventricular tachycardia, unspecified: Secondary | ICD-10-CM

## 2020-08-30 DIAGNOSIS — I5022 Chronic systolic (congestive) heart failure: Secondary | ICD-10-CM

## 2020-08-30 DIAGNOSIS — Z951 Presence of aortocoronary bypass graft: Secondary | ICD-10-CM

## 2020-08-30 LAB — BASIC METABOLIC PANEL
BUN/Creatinine Ratio: 15 (ref 10–24)
BUN: 16 mg/dL (ref 8–27)
CO2: 29 mmol/L (ref 20–29)
Calcium: 9.8 mg/dL (ref 8.6–10.2)
Chloride: 98 mmol/L (ref 96–106)
Creatinine, Ser: 1.06 mg/dL (ref 0.76–1.27)
GFR calc Af Amer: 82 mL/min/{1.73_m2} (ref >59)
GFR calc non Af Amer: 71 mL/min/{1.73_m2} (ref >59)
Glucose: 116 mg/dL — ABNORMAL HIGH (ref 65–99)
Potassium: 4.4 mmol/L (ref 3.5–5.2)
Sodium: 139 mmol/L (ref 134–144)

## 2020-08-30 LAB — CUP PACEART INCLINIC DEVICE CHECK
Battery Remaining Longevity: 74 mo
Brady Statistic RV Percent Paced: 2.4 %
Date Time Interrogation Session: 20210916091423
HighPow Impedance: 51.4643
Implantable Lead Implant Date: 20090618
Implantable Lead Location: 753860
Implantable Lead Model: 7121
Implantable Pulse Generator Implant Date: 20180220
Lead Channel Impedance Value: 425 Ohm
Lead Channel Pacing Threshold Amplitude: 1 V
Lead Channel Pacing Threshold Amplitude: 1 V
Lead Channel Pacing Threshold Pulse Width: 0.8 ms
Lead Channel Pacing Threshold Pulse Width: 0.8 ms
Lead Channel Sensing Intrinsic Amplitude: 4.9 mV
Lead Channel Setting Pacing Amplitude: 2.5 V
Lead Channel Setting Pacing Pulse Width: 0.8 ms
Lead Channel Setting Sensing Sensitivity: 0.5 mV
Pulse Gen Serial Number: 7406967

## 2020-08-30 LAB — CBC WITH DIFFERENTIAL/PLATELET
Basophils Absolute: 0.1 10*3/uL (ref 0.0–0.2)
Basos: 1 %
EOS (ABSOLUTE): 0.2 10*3/uL (ref 0.0–0.4)
Eos: 2 %
Hematocrit: 43.3 % (ref 37.5–51.0)
Hemoglobin: 14.4 g/dL (ref 13.0–17.7)
Immature Grans (Abs): 0 10*3/uL (ref 0.0–0.1)
Immature Granulocytes: 0 %
Lymphocytes Absolute: 1.3 10*3/uL (ref 0.7–3.1)
Lymphs: 20 %
MCH: 30.4 pg (ref 26.6–33.0)
MCHC: 33.3 g/dL (ref 31.5–35.7)
MCV: 91 fL (ref 79–97)
Monocytes Absolute: 0.9 10*3/uL (ref 0.1–0.9)
Monocytes: 15 %
Neutrophils Absolute: 3.9 10*3/uL (ref 1.4–7.0)
Neutrophils: 62 %
Platelets: 188 10*3/uL (ref 150–450)
RBC: 4.74 x10E6/uL (ref 4.14–5.80)
RDW: 12.1 % (ref 11.6–15.4)
WBC: 6.4 10*3/uL (ref 3.4–10.8)

## 2020-08-30 MED ORDER — APIXABAN 5 MG PO TABS: 5.0000 mg | ORAL_TABLET | Freq: Two times a day (BID) | ORAL | 11 refills | Status: DC

## 2020-08-30 NOTE — Patient Instructions (Addendum)
Medication Instructions:  Your physician has recommended you make the following change in your medication:  -- STOP TAKING Aspirin -- START Eliquis 5 mg - Take 1 tablet (5mg ) by mouth twice daily -- RX SENT   *If you need a refill on your cardiac medications before your next appointment, please call your pharmacy*  Lab Work: Your physician has recommended that you have lab work today: [place] and BM[facility]  If you have labs (blood work) drawn today and your tests are completely normal, you will receive your results only by:  MyChart Message (if you have MyChart) OR  A paper copy in the mail If you have any lab test that is abnormal or we need to change your treatment, we will call you to review the results.  Testing/Procedures:  You will need to have a pre procedure Covid Screening Test on 09/21/20 at 8:10 am   --This is a Drive Up Visit at [address] [place], [place] [address].  --Someone will direct you to the appropriate testing line. Stay in your car and someone will be with you shortly.  Your physician has recommended that you have a Cardioversion (DCCV). Electrical Cardioversion uses a jolt of electricity to your heart either through paddles or wired patches attached to your chest. This is a controlled, usually prescheduled, procedure. Defibrillation is done under light anesthesia in the hospital, and you usually go home the day of the procedure. This is done to get your heart back into a normal rhythm. You are not awake for the procedure. Please see the instruction sheet given to you today.  Follow-Up: At [facility], you and your health needs are our priority.  As part of our continuing mission to provide you with exceptional heart care, we have created designated Provider Care Teams.  These Care Teams include your primary Cardiologist (physician) and Advanced Practice Providers (APPs -  Physician Assistants and Nurse Practitioners) who all work together to provide you with the care  you need, when you need it.  We recommend signing up for the patient portal called "MyChart".  Sign up information is provided on this After Visit Summary.  MyChart is used to connect with patients for Virtual Visits (Telemedicine).  Patients are able to view lab/test results, encounter notes, upcoming appointments, etc.  Non-urgent messages can be sent to your provider as well.   To learn more about what you can do with MyChart, go to [email or web address].    Your next appointment:   Your physician recommends that you schedule a follow-up appointment in: 3 MONTHS with [name]  Remote monitoring is used to monitor your ICD from home. This monitoring reduces the number of office visits required to check your device to one time per year. It allows [place] to keep an eye on the functioning of your device to ensure it is working properly. You are scheduled for a device check from home on 10/30/20. You may send your transmission at any time that day. If you have a wireless device, the transmission will be sent automatically. After your physician reviews your transmission, you will receive a postcard with your next transmission date.  The format for your next appointment:   In Person with Cristopher Peru, MD   [place] are scheduled for a cardioversion on Monday 09/24/20 at 7:30 am with Dr. Haroldine Laws. Please go to [facility] [place] [facility] at 6:30 am.  Enter through the [place] not have any food or  drink after midnight on 09/23/20.  You may take your medicines with a sip of water on the day of your procedure.  You will need someone to drive you home following your procedure.   Call the [place] office at [phone] if you have any questions, problems or concerns.     Electrical Cardioversion Electrical cardioversion is the delivery of a jolt of electricity to change the rhythm of the heart. Sticky patches or metal  paddles are placed on the chest to deliver the electricity from a device. This is done to restore a normal rhythm. A rhythm that is too fast or not regular keeps the heart from pumping well. Electrical cardioversion is done in an emergency if:   There is low or no blood pressure as a result of the heart rhythm.   Normal rhythm must be restored as fast as possible to protect the brain and heart from further damage.   It may save a life. Cardioversion may be done for heart rhythms that are not immediately life threatening, such as atrial fibrillation or flutter, in which:   The heart is beating too fast or is not regular.   Medicine to change the rhythm has not worked.   It is safe to wait in order to allow time for preparation.  Symptoms of the abnormal rhythm are bothersome.  The risk of stroke and other serious problems can be reduced.  LET [facility] CARE PROVIDER KNOW ABOUT:   Any allergies you have.  All medicines you are taking, including vitamins, herbs, eye drops, creams, and over-the-counter medicines.  Previous problems you or members of your family have had with the use of anesthetics.   Any blood disorders you have.   Previous surgeries you have had.   Medical conditions you have.   RISKS AND COMPLICATIONS  Generally, this is a safe procedure. However, problems can occur and include:   Breathing problems related to the anesthetic used.  A blood clot that breaks free and travels to other parts of your body. This could cause a stroke or other problems. The risk of this is lowered by use of blood-thinning medicine (anticoagulant) prior to the procedure.  Cardiac arrest (rare).   BEFORE THE PROCEDURE   You may have tests to detect blood clots in your heart and to evaluate heart function.  You may start taking anticoagulants so your blood does not clot as easily.   Medicines may be given to help stabilize your heart rate and  rhythm.   PROCEDURE  You will be given medicine through an IV tube to reduce discomfort and make you sleepy (sedative).   An electrical shock will be delivered.   AFTER THE PROCEDURE Your heart rhythm will be watched to make sure it does not change. You will need someone to drive you home

## 2020-09-03 ENCOUNTER — Telehealth: Payer: Self-pay

## 2020-09-03 NOTE — Telephone Encounter (Signed)
-----   Message from Patsey Berthold, NP sent at 08/31/2020  6:39 AM EDT ----- Please notify patient of stable labs. Thanks!

## 2020-09-03 NOTE — Telephone Encounter (Signed)
Pt is aware and agreeable to stable labs.  

## 2020-09-12 ENCOUNTER — Telehealth: Payer: Self-pay

## 2020-09-12 NOTE — Telephone Encounter (Signed)
[place] alert received 09/12/20 for . 1 VT-1 event 09/10/20, EGM appears SVT 180-190's, multiple ATP attempts without change in rhythm. Total 2 shocks delivered. Noted increased amount of NSVT on 09/10/20.   Patient states he was playing golf during episode. Patient states he hit a golf ball, walked back to the cart and felt dizzy. States dizziness he felt dizzy then was felt shocked. States he does remember both shocks. Reports over the past several weeks he has been more difficult to exercise d/t shortness on exertion.  Reports compliance with medications including Eliquis, Farxiga, Lasix 40 mg daily, Toprol- XL 12.5 mg BID. Denies fluid issues such as leg swelling.  States he called Dr. Haroldine Laws after episodes and was told to not play golf and try to rest. States he was advised to call and see him this week. Patient called this morning and left message on VM.  Patient currently has no symptoms.  Shock plan reviewed. [place] DMV driving restrictions reviewed.  Patient to see Dr. Dannielle Burn per patients conversation with Dr. Dannielle Burn. Advised patient I would forward this to triage to help get patient into office.

## 2020-09-12 NOTE — Telephone Encounter (Signed)
Per Dr Haroldine Laws pt needs appt w/him tomorrow with labs, appt sch for 11 am 9/30, pt aware

## 2020-09-13 ENCOUNTER — Encounter (HOSPITAL_COMMUNITY): Payer: Self-pay | Admitting: Internal Medicine

## 2020-09-13 ENCOUNTER — Other Ambulatory Visit: Payer: Self-pay

## 2020-09-13 ENCOUNTER — Ambulatory Visit (HOSPITAL_COMMUNITY)
Admission: RE | Admit: 2020-09-13 | Discharge: 2020-09-13 | Disposition: A | Discharge status: Home / Self Care | Payer: Medicare HMO | Source: Ambulatory Visit | Attending: Internal Medicine | Admitting: Internal Medicine

## 2020-09-13 VITALS — BP 90/60 | HR 51 | Ht 69.0 in | Wt 168.2 lb

## 2020-09-13 DIAGNOSIS — Z951 Presence of aortocoronary bypass graft: Secondary | ICD-10-CM | POA: Diagnosis not present

## 2020-09-13 DIAGNOSIS — Z85819 Personal history of malignant neoplasm of unspecified site of lip, oral cavity, and pharynx: Secondary | ICD-10-CM | POA: Insufficient documentation

## 2020-09-13 DIAGNOSIS — I2510 Atherosclerotic heart disease of native coronary artery without angina pectoris: Secondary | ICD-10-CM

## 2020-09-13 DIAGNOSIS — I472 Ventricular tachycardia, unspecified: Secondary | ICD-10-CM

## 2020-09-13 DIAGNOSIS — Z95810 Presence of automatic (implantable) cardiac defibrillator: Secondary | ICD-10-CM | POA: Insufficient documentation

## 2020-09-13 DIAGNOSIS — E119 Type 2 diabetes mellitus without complications: Secondary | ICD-10-CM | POA: Diagnosis not present

## 2020-09-13 DIAGNOSIS — Z9221 Personal history of antineoplastic chemotherapy: Secondary | ICD-10-CM | POA: Insufficient documentation

## 2020-09-13 DIAGNOSIS — I081 Rheumatic disorders of both mitral and tricuspid valves: Secondary | ICD-10-CM | POA: Insufficient documentation

## 2020-09-13 DIAGNOSIS — E785 Hyperlipidemia, unspecified: Secondary | ICD-10-CM | POA: Diagnosis not present

## 2020-09-13 DIAGNOSIS — I4510 Unspecified right bundle-branch block: Secondary | ICD-10-CM | POA: Diagnosis not present

## 2020-09-13 DIAGNOSIS — Z79899 Other long term (current) drug therapy: Secondary | ICD-10-CM | POA: Insufficient documentation

## 2020-09-13 DIAGNOSIS — Z87891 Personal history of nicotine dependence: Secondary | ICD-10-CM | POA: Diagnosis not present

## 2020-09-13 DIAGNOSIS — I5022 Chronic systolic (congestive) heart failure: Secondary | ICD-10-CM

## 2020-09-13 DIAGNOSIS — I480 Paroxysmal atrial fibrillation: Secondary | ICD-10-CM | POA: Diagnosis not present

## 2020-09-13 DIAGNOSIS — Z923 Personal history of irradiation: Secondary | ICD-10-CM | POA: Insufficient documentation

## 2020-09-13 DIAGNOSIS — Z7901 Long term (current) use of anticoagulants: Secondary | ICD-10-CM | POA: Diagnosis not present

## 2020-09-13 LAB — COMPREHENSIVE METABOLIC PANEL
ALT: 29 U/L (ref 0–44)
AST: 25 U/L (ref 15–41)
Albumin: 3.9 g/dL (ref 3.5–5.0)
Alkaline Phosphatase: 63 U/L (ref 38–126)
Anion gap: 9 (ref 5–15)
BUN: 17 mg/dL (ref 8–23)
CO2: 29 mmol/L (ref 22–32)
Calcium: 9.6 mg/dL (ref 8.9–10.3)
Chloride: 99 mmol/L (ref 98–111)
Creatinine, Ser: 1 mg/dL (ref 0.61–1.24)
GFR calc Af Amer: >60 mL/min (ref >60)
GFR calc non Af Amer: >60 mL/min (ref >60)
Glucose, Bld: 111 mg/dL — ABNORMAL HIGH (ref 70–99)
Potassium: 3.7 mmol/L (ref 3.5–5.1)
Sodium: 137 mmol/L (ref 135–145)
Total Bilirubin: 2.3 mg/dL — ABNORMAL HIGH (ref 0.3–1.2)
Total Protein: 6.7 g/dL (ref 6.5–8.1)

## 2020-09-13 LAB — CBC
HCT: 46 % (ref 39.0–52.0)
Hemoglobin: 14.2 g/dL (ref 13.0–17.0)
MCH: 29.4 pg (ref 26.0–34.0)
MCHC: 30.9 g/dL (ref 30.0–36.0)
MCV: 95.2 fL (ref 80.0–100.0)
Platelets: 181 10*3/uL (ref 150–400)
RBC: 4.83 MIL/uL (ref 4.22–5.81)
RDW: 12.3 % (ref 11.5–15.5)
WBC: 6.9 10*3/uL (ref 4.0–10.5)
nRBC: 0 % (ref 0.0–0.2)

## 2020-09-13 LAB — BRAIN NATRIURETIC PEPTIDE: B Natriuretic Peptide: 231.5 pg/mL — ABNORMAL HIGH (ref 0.0–100.0)

## 2020-09-13 MED ORDER — AMIODARONE HCL 200 MG PO TABS: 200.0000 mg | ORAL_TABLET | Freq: Every day | ORAL | 3 refills | Status: DC

## 2020-09-13 NOTE — Patient Instructions (Signed)
Stop Metoprolol  Start Amiodarone 200 mg Daily  Labs done today, your results will be available in MyChart, we will contact you for abnormal readings.  Your physician recommends that you schedule a follow-up appointment in: 1 month  If you have any questions or concerns before your next appointment please send [place] a message through [place] or call our office at [phone].    TO LEAVE A MESSAGE FOR THE NURSE SELECT OPTION 2, PLEASE LEAVE A MESSAGE INCLUDING: . YOUR NAME . DATE OF BIRTH . CALL BACK NUMBER . REASON FOR CALL**this is important as we prioritize the call backs  [place] AS LONG AS YOU CALL BEFORE 4:00 PM  At the [facility], you and your health needs are our priority. As part of our continuing mission to provide you with exceptional heart care, we have created designated Provider Care Teams. These Care Teams include your primary Cardiologist (physician) and Advanced Practice Providers (APPs- Physician Assistants and Nurse Practitioners) who all work together to provide you with the care you need, when you need it.   You may see any of the following providers on your designated Care Team at your next follow up: Marland Kitchen Dr Glori Bickers . Dr Loralie Champagne . [name], NP . Lyda Jester, PA . Audry Riles, PharmD   Please be sure to bring in all your medications bottles to every appointment.

## 2020-09-13 NOTE — Progress Notes (Addendum)
Advanced Heart Failure Clinic Note  Date:  09/13/2020   ID:  Matthew Romans Sr., DOB Nov 11, 1951, MRN [id number]  Location: Home  Provider location: [facility] Type of Visit: Established patient  PCP:  Colon Branch, MD  Cardiologist:  No primary care provider on file. Primary HF: Anyssa Sharpless  Chief Complaint: Heart Failure follow-up   History of Present Illness:  Matthew Joyce is a25 y.o.malewith a history of a CAD S/P CABG in 2006 (LIMA -> LAD, SVG -> Ramus, SVG -> OM-2, SVG -> RCA), s/p ICD, ischemic MR, DM, hyperlipidemia, and throat cancer s/p chemo/XRT in 2018  Stress test 08/14/2016 with EF 31%, old infarct, no ischemia.  Developed CP and SOB in early October. Cath on 09/20/19. Coronary anatomy was stable but EF down to 20% on cath with 3+ MR Echo on 09/20/19 read as EF 25-30% with mod MR. (I reviewed echo and EF 20-25% and moderate to severe MR). Since that time has had CPX with only mild HF limitation.  TEE EF 20-25%. 3+MR and severe TR.   On 01/29/20 woke from sleep for unknown reasons. Found to have shock for VT/VF. Saw [name]   On 07/22/20 had multiple runs of NSVT, SVT and 1 run of VT (20 seconds max rate 220). Felt to have gotten inappropriate ATP for AF. Seen by Chanetta Marshall in [facility] on 08/23/20 felt to be in AF since 07/15/2020.  Here for unscheduled visit due to ICD firing x 2. Has been doing ok at baseline. Having good days and bad days. Seems to be a little worse. On some days SOB to mailbox. On other days plays golf and can walk some of the course. Was at [place] over the weekend playing golf and had ICD fire x 2. No preceding angina or other triggers. Device interrogation showed what seems to be rapid AF with attempted ATP and ICD shock at 15J. Finally converted with 30J. Feels ok now. A bit fatigued and not completely back to baseline.     Echo 05/29/20: EF 20% mod to severe RV dysfunction severe central MR Personally  reviewed   Cath 09/20/19   1. 3v CAD  2. Patent LIMA to LAD 3. Patent free RIMA to ramus 4. Occluded SVG to RCA with widely patent native RCA 5. Unable to locate SVG to OM2 mentioned in OP report. There is no graft marker for this graft and Ar root shot did not show any SVG to OM (suspect may not have been placed) 6. EF 20% with global HK and 3+ MR   Ao = 108/53 (72) LV = 109/19 RA = 9 RV = 60/10 PA = 63/23 (36) PCW = 22 (v = 35-40) Fick cardiac output/index = 4.5/2.4 PVR = 3.0 WU SVR 1310 Ao sat = 99% PA sat = 66%, 69%  CPX 06/2020  FVC 3.54 (85%)    FEV1 2.81 (89%)     FEV1/FVC 79 (104%)     MVV 104 (82%)      Resting HR: 43 Standing HR:47 Peak HR: 104  (69% age predicted max HR)  BP rest: 84/60 Standing BP: 76/58 BP peak: 108/60  Peak VO2: 27.5 (99% predicted peak VO2)  VE/VCO2 slope: 30  OUES: 1.99  Peak RER: 0.95  Ventilatory Threshold: 23.7 (85% predicted or measured peak VO2)  Peak RR 39  Peak Ventilation: 63.0  VE/MVV: 61%  PETCO2 at peak: 33  O2pulse: 19  (146% predicted O2pulse)  CPX test 10/20/19   FVC 3.22 (81%)    FEV1 2.54 (84%)     FEV1/FVC 79 (103%)     MVV 92 (76%)     Resting HR: 44 Standing HR: 47 Peak HR: 107  (71% age predicted max HR)  BP rest: 92/56 Standing BP: 86/52 BP peak: 116/62  Peak VO2: 24.5 (88% predicted peak VO2)  VE/VCO2 slope: 37  OUES: 2.11  Peak RER: 1.01 O2pulse: 19  (127% predicted O2pulse)    TEE 10/24/19 1. Left ventricular ejection fraction 20-25% 2. Global right ventricle has moderately reduced systolic function. 3. Left atrial size was severely dilated. 4. No LAA clot. 5. Right atrial size was mild-moderately dilated. 6. The mitral valve is abnormal. Moderate mitral valve regurgitation. 7. Mildly restricted posterior MV leaflet with 3+ MR. 8. The tricuspid valve is normal in structure. Tricuspid valve regurgitation is severe.   Echo 07/2016 LVEF 30-35%,  Mod MR, Mod LAE, Mildly dilated RV with mild reduced RV function, PA peak pressure 51 mm Hg.     [name] denies symptoms worrisome for COVID 19.   Past Medical History:  Diagnosis Date  . AICD (automatic cardioverter/defibrillator) present 6/09   St. Jude  . Bradycardia    Use of beta blocker limited  . CAD (coronary artery disease)    a- S/p cabg in march 2006 by Dr.Owen;  b. myoview 5/12: large IL scar from apex to base, no ischemia, EF 37%  . Cancer ([place])    on right side of neck  . ED (erectile dysfunction)   . Gout   . Hyperlipidemia   . Ischemic cardiomyopathy    a-Echocardiogram, Nov 2006, showed ejection fraction of 30-40% with mild to moderated mitral  regurgitation and mild aortic regurgitation b- Cardiac MRI, May 2007, EF of 44% with 50% scar involving  the inferolateral walls. No comment of mitral regurgitation. c- last echo  11/10: EF 30-35%  mod MR d- Nega T-Wave alternans testing in May of 2007 e- no ACE due to low BP;  f. CPX 3/11: normal  . 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 Systolic CHF, chronic ([place])    Past Surgical History:  Procedure Laterality Date  . CARDIAC DEFIBRILLATOR PLACEMENT  06/01/08   ICD  . COLONOSCOPY    . COLONOSCOPY WITH PROPOFOL N/A 07/28/2019   Procedure: COLONOSCOPY WITH PROPOFOL;  Surgeon: [name], MD;  Location: [facility] END[facility];  Service: Endoscopy;  Laterality: N/A;  . CORONARY ARTERY BYPASS GRAFT  02-2005  . ICD GENERATOR CHANGEOUT N/A 02/03/2017   Procedure: ICD Generator Changeout;  Surgeon: [name], MD;  Location: [facility];  Service: Cardiovascular;  Laterality: N/A;  . ORIF [place]   left   . RIGHT/LEFT HEART CATH AND CORONARY/GRAFT ANGIOGRAPHY N/A 09/20/2019   Procedure: RIGHT/LEFT HEART CATH AND CORONARY/GRAFT ANGIOGRAPHY;  Surgeon: [name], MD;  Location: [place] [facility];  Service: Cardiovascular;  Laterality: N/A;  . SHOULDER  ARTHROSCOPY  02/2010   rt   . TEE WITHOUT CARDIOVERSION N/A 10/24/2019   Procedure: TRANSESOPHAGEAL ECHOCARDIOGRAM (TEE);  Surgeon: [name], MD;  Location: [facility] ENDOSCOPY;  Service: Cardiovascular;  Laterality: N/A;  . TONSILLECTOMY     as a child     Current Outpatient Medications  Medication Sig Dispense Refill  . acetaminophen (TYLENOL) 325 MG tablet Take 650 mg by mouth every 6 (six) hours as needed (for pain.).    Marland Kitchen  apixaban (ELIQUIS) 5 MG TABS tablet Take 1 tablet (5 mg total) by mouth 2 (two) times daily. 60 tablet 11  . candesartan (ATACAND) 4 MG tablet Take 4 mg by mouth in the morning and at bedtime.    . carboxymethylcellul-glycerin (OPTIVE) 0.5-0.9 % ophthalmic solution Place 1 drop into both eyes daily.    . cetirizine (ZYRTEC) 10 MG tablet Take 10 mg by mouth daily.      . Colchicine (MITIGARE) 0.6 MG CAPS Take 0.6 mg by mouth 2 (two) times daily as needed (gout flare). 60 capsule 0  . FARXIGA 10 MG TABS tablet TAKE 1 TABLET BY MOUTH BEFORE BREAKFAST (Patient taking differently: Take 10 mg by mouth daily. ) 90 tablet 3  . furosemide (LASIX) 40 MG tablet TAKE 1 TABLET EVERY DAY (Patient taking differently: Take 40 mg by mouth daily. ) 90 tablet 3  . metoprolol succinate (TOPROL-XL) 25 MG 24 hr tablet Take 0.5 tablets (12.5 mg total) by mouth 2 (two) times daily. 90 tablet 3  . nitroGLYCERIN (NITROSTAT) 0.4 MG SL tablet Place 1 tablet (0.4 mg total) under the tongue every 5 (five) minutes x 3 doses as needed for chest pain. 25 tablet 3  . Omega-3 Fatty Acids (FISH OIL) 1200 MG CAPS Take 3,600 mg by mouth daily.    . rosuvastatin (CRESTOR) 40 MG tablet Take 1 tablet (40 mg total) by mouth at bedtime. 90 tablet 3  . spironolactone (ALDACTONE) 25 MG tablet Take 0.5 tablets (12.5 mg total) by mouth daily. 45 tablet 3   No current facility-administered medications for this encounter.    Allergies:   Atorvastatin, Sulfonamide derivatives, Tape, and Sulfamethoxazole    Social History:  The patient  reports that he quit smoking about 20 years ago. His smoking use included cigarettes. He smoked 0.10 packs per day. He has never used smokeless tobacco. He reports current alcohol use. He reports that he does not use drugs.   Family History:  The patient's family history includes Cardiomyopathy in his brother; Heart attack (age of onset: 66) in his father.   ROS:  Please see the history of present illness.   All other systems are personally reviewed and negative.   Vitals:   09/13/20 1054  BP: 90/60  Pulse: (!) 51  SpO2: 99%  Weight: 76.3 kg (168 lb 3.2 oz)  Height: 5\' 9"  (1.753 m)    Exam:   General:  Fatigued appearing. No resp difficulty HEENT: normal Neck: supple. no JVD. Carotids 2+ bilat; no bruits. No lymphadenopathy or thryomegaly appreciated. Cor: PMI nondisplaced. Loletha Grayer regular.  2/6 MR Lungs: clear Abdomen: soft, nontender, nondistended. No hepatosplenomegaly. No bruits or masses. Good bowel sounds. Extremities: no cyanosis, clubbing, rash, edema Neuro: alert & orientedx3, cranial nerves grossly intact. moves all 4 extremities w/o difficulty. Affect pleasant    ECG sinus brady 47 RBBB. Personally reviewed   Recent Labs: 05/29/2020: B Natriuretic Peptide 285.3 06/14/2020: ALT 18; TSH 5.49 08/30/2020: BUN 16; Creatinine, Ser 1.06; Hemoglobin 14.4; Platelets 188; Potassium 4.4; Sodium 139  Personally reviewed   Wt Readings from Last 3 Encounters:  09/13/20 76.3 kg (168 lb 3.2 oz)  08/30/20 77.2 kg (170 lb 3.2 oz)  06/14/20 75.5 kg (166 lb 8 oz)      ASSESSMENT AND PLAN:  1. ICD firing/PAF - I have reviewed ICD interrogation personally with EP NP Tree surgeon). We agree that this is likely AF with RVR (no atrial lead). He is now in [place].  - ICD previously  reprogrammed. - Will need AA drug,. We discussed pros/cons of amio and Tikosyn. We have settled on low-dose amio (HR already in 40-50s). Start amio 200 daily. Continue Eliquis -  Check labs 2. Chronic Systolic Heart Failure ICM ECHO 08/2013 EF 45-50% Has St Jude ICD 2009 - Echo 07/2016 LVEF 30-35%, Mod MR, Mod LAE, Mildly dilated RV with mild reduced RV function, PA peak pressure 51 mm Hg.  - TEE 11/20 Left ventricular ejection fraction 20-25% Moderately reduced RV function 3+ MR severe TR - - Echo 6/21 LV is markedly dilated with EF 15-20% severe MR. RV at least moderately reduced. Severe TR - Progressive NYHA III symptoms. Volume status ok  - CPX 11/20 with preserved VO2 with moderately elevated slope - CPX 7/21 Peak VO2: 27.5 (99% predicted peak VO2)  VE/VCO2 slope: 30. RER 0.95 - Difficult situation. He has severe biventricular dysfunction with severe MR/TR. But CPX from 7/21 was suprisingly good.  I worry that at some point in the relatively near future his exercise capacity may fall off quickly but currently he is not candidate for transplant or VAD. I have reviewed his case with structural team and they feel he likely would not benefit from MitraClip (more like [place] patient). I also discussed with Dr. Mosetta Pigeon at [facility] today and based on CPX and h/o relatively recent head & neck cancer risk of transplant/immunosuppression likely outweighs benefits of transplant given his pVO2. Consider repeat CPX in 6 months. - Stop Toprol XL 12.5 mg BID with addition of amio and bradycardia -Continue candesartan 4 mg bid (BP too soft for Entresto) - Continue Farxiga 10  - Continue spiro 12.5  - Continue lasix 40 daily - Blood Type O positive  3. CAD S/P CABG 2006 -No s/s angina - Coronary angio 10/20 with stable revascularization. -Continue statin. Off ASA with Eliquis use.  4. Hyperlipidemia - Continue Crestor. Goal LDL < 70  - Per PCP 5. RBBB - stable  6. Throat Cancer - on R side. Squamous Cell Carcinoma with unclear primary.  - s/p chemo/[facility]RT at [facility] in 2018 (Finished treatments in 2/19) - remains cancer free on recent eval 7. Severe mitral regurgitation - plan  as above. Echo reviewed by Structural Team felt not to be candidate for MitraClip  Total time spent 40 minutes. Over half that time spent discussing above.   Signed, Glori Bickers, MD  09/13/2020 11:09 AM  Advanced Heart Failure [place] [address] [phone] (office) [phone] (fax)

## 2020-09-14 ENCOUNTER — Other Ambulatory Visit (HOSPITAL_COMMUNITY): Payer: Self-pay | Admitting: Internal Medicine

## 2020-09-15 NOTE — Addendum Note (Signed)
Encounter addended by: [name], MD on: 09/15/2020 3:36 PM  Actions taken: Level of Service modified, Visit diagnoses modified, Charge Capture section accepted, Clinical Note Signed

## 2020-09-21 ENCOUNTER — Other Ambulatory Visit (HOSPITAL_COMMUNITY): Payer: Medicare HMO

## 2020-09-24 ENCOUNTER — Encounter (HOSPITAL_COMMUNITY): Payer: Self-pay

## 2020-09-24 ENCOUNTER — Ambulatory Visit (HOSPITAL_COMMUNITY): Admit: 2020-09-24 | Payer: Medicare HMO | Admitting: Internal Medicine

## 2020-09-24 SURGERY — CARDIOVERSION
Anesthesia: Monitor Anesthesia Care

## 2020-10-10 NOTE — Progress Notes (Signed)
Advanced [facility] Failure Clinic Note  Date:  10/11/2020   ID:  Matthew Romans Sr., DOB December 10, 1951, MRN [id number]  Location: Home  Provider location: [facility] Type of Visit: Established patient  PCP:  Colon Branch, MD  Cardiologist:  No primary care provider on file. Primary HF: Matthew Joyce  Chief Complaint: Heart Failure follow-up   History of Present Illness:  Matthew Joyce is a83 y.o.malewith a history of a CAD S/P CABG in 2006 (LIMA -> LAD, SVG -> Ramus, SVG -> OM-2, SVG -> RCA), s/p ICD, ischemic MR, DM, hyperlipidemia, and throat cancer s/p chemo/XRT in 2018  Stress test 08/14/2016 with EF 31%, old infarct, no ischemia.  Developed CP and SOB in early October. Cath on 09/20/19. Coronary anatomy was stable but EF down to 20% on cath with 3+ MR Echo on 09/20/19 read as EF 25-30% with mod MR. (I reviewed echo and EF 20-25% and moderate to severe MR). Since that time has had CPX with only mild HF limitation.  TEE EF 20-25%. 3+MR and severe TR.   On 01/29/20 woke from sleep for unknown reasons. Found to have shock for VT/VF. Saw [name]   On 07/22/20 had multiple runs of NSVT, SVT and 1 run of VT (20 seconds max rate 220). Felt to have gotten inappropriate ATP for AF. Seen by Chanetta Marshall in [facility] on 08/23/20 felt to be in AF since 07/15/2020.  Seen in 9/21 for ICD firing due to AF with RVR. Started on amio and b-blocker stopped due to bradycardia.   Echo 05/29/20: EF 20% mod to severe RV dysfunction severe central MR Personally reviewed  Here for routine f/u. Feels much better. Playing some golf. Able to do all ADLs without problem. No edema, orthopnea or CP. Tolerating amio well. No further ICD shocks. Not walking as much or going to gym. No bleeding with Eliquis.   Cath 09/20/19   1. 3v CAD  2. Patent LIMA to LAD 3. Patent free RIMA to ramus 4. Occluded SVG to RCA with widely patent native RCA 5. Unable to locate SVG to OM2 mentioned in OP  report. There is no graft marker for this graft and Ar root shot did not show any SVG to OM (suspect may not have been placed) 6. EF 20% with global HK and 3+ MR   Ao = 108/53 (72) LV = 109/19 RA = 9 RV = 60/10 PA = 63/23 (36) PCW = 22 (v = 35-40) Fick cardiac output/index = 4.5/2.4 PVR = 3.0 WU SVR 1310 Ao sat = 99% PA sat = 66%, 69%  CPX 06/2020  FVC 3.54 (85%)    FEV1 2.81 (89%)     FEV1/FVC 79 (104%)     MVV 104 (82%)      Resting HR: 43 Standing HR:47 Peak HR: 104  (69% age predicted max HR)  BP rest: 84/60 Standing BP: 76/58 BP peak: 108/60  Peak VO2: 27.5 (99% predicted peak VO2)  VE/VCO2 slope: 30  OUES: 1.99  Peak RER: 0.95  Ventilatory Threshold: 23.7 (85% predicted or measured peak VO2)  Peak RR 39  Peak Ventilation: 63.0  VE/MVV: 61%  PETCO2 at peak: 33  O2pulse: 19  (146% predicted O2pulse)    CPX test 10/20/19   FVC 3.22 (81%)    FEV1 2.54 (84%)     FEV1/FVC 79 (103%)     MVV 92 (76%)     Resting HR: 44 Standing HR: 47  Peak HR: 107  (71% age predicted max HR)  BP rest: 92/56 Standing BP: 86/52 BP peak: 116/62  Peak VO2: 24.5 (88% predicted peak VO2)  VE/VCO2 slope: 37  OUES: 2.11  Peak RER: 1.01 O2pulse: 19  (127% predicted O2pulse)    TEE 10/24/19 1. Left ventricular ejection fraction 20-25% 2. Global right ventricle has moderately reduced systolic function. 3. Left atrial size was severely dilated. 4. No LAA clot. 5. Right atrial size was mild-moderately dilated. 6. The mitral valve is abnormal. Moderate mitral valve regurgitation. 7. Mildly restricted posterior MV leaflet with 3+ MR. 8. The tricuspid valve is normal in structure. Tricuspid valve regurgitation is severe.  Past Medical History:  Diagnosis Date  . AICD (automatic cardioverter/defibrillator) present 6/09   St. Jude  . Bradycardia    Use of beta blocker limited  . CAD (coronary artery disease)    a- S/p cabg in march 2006 by  Dr.Owen;  b. myoview 5/12: large IL scar from apex to base, no ischemia, EF 37%  . Cancer ([place])    on right side of neck  . ED (erectile dysfunction)   . Gout   . Hyperlipidemia   . Ischemic cardiomyopathy    a-Echocardiogram, Nov 2006, showed ejection fraction of 30-40% with mild to moderated mitral  regurgitation and mild aortic regurgitation b- Cardiac MRI, May 2007, EF of 44% with 50% scar involving  the inferolateral walls. No comment of mitral regurgitation. c- last echo  11/10: EF 30-35%  mod MR d- Nega T-Wave alternans testing in May of 2007 e- no ACE due to low BP;  f. CPX 3/11: normal  . 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 Systolic CHF, chronic ([place])    Past Surgical History:  Procedure Laterality Date  . CARDIAC DEFIBRILLATOR PLACEMENT  06/01/08   ICD  . COLONOSCOPY    . COLONOSCOPY WITH PROPOFOL N/A 07/28/2019   Procedure: COLONOSCOPY WITH PROPOFOL;  Surgeon: [name], MD;  Location: [facility];  Service: Endoscopy;  Laterality: N/A;  . CORONARY ARTERY BYPASS GRAFT  02-2005  . ICD GENERATOR CHANGEOUT N/A 02/03/2017   Procedure: ICD Generator Changeout;  Surgeon: [name], MD;  Location: [place] [facility];  Service: Cardiovascular;  Laterality: N/A;  . ORIF [place]   left   . RIGHT/LEFT HEART CATH AND CORONARY/GRAFT ANGIOGRAPHY N/A 09/20/2019   Procedure: RIGHT/LEFT HEART CATH AND CORONARY/GRAFT ANGIOGRAPHY;  Surgeon: [name], MD;  Location: [facility];  Service: Cardiovascular;  Laterality: N/A;  . SHOULDER ARTHROSCOPY  02/2010   rt   . TEE WITHOUT CARDIOVERSION N/A 10/24/2019   Procedure: TRANSESOPHAGEAL ECHOCARDIOGRAM (TEE);  Surgeon: [name], MD;  Location[facility] ENDOSCOPY;  Service: Cardiovascular;  Laterality: N/A;  . TONSILLECTOMY     as a child     Current Outpatient Medications  Medication Sig Dispense Refill  . acetaminophen (TYLENOL) 325 MG tablet Take 650 mg by mouth  every 6 (six) hours as needed (for pain.).    Marland Kitchen amiodarone (PACERONE) 200 MG tablet Take 1 tablet (200 mg total) by mouth daily. 30 tablet 3  . apixaban (ELIQUIS) 5 MG TABS tablet Take 1 tablet (5 mg total) by mouth 2 (two) times daily. 60 tablet 11  . candesartan (ATACAND) 4 MG tablet Take 4 mg by mouth in the morning and at bedtime.    . carboxymethylcellul-glycerin (OPTIVE) 0.5-0.9 % ophthalmic solution Place 1 drop into both eyes  daily.    . cetirizine (ZYRTEC) 10 MG tablet Take 10 mg by mouth daily.      . Colchicine (MITIGARE) 0.6 MG CAPS Take 0.6 mg by mouth 2 (two) times daily as needed (gout flare). 60 capsule 0  . FARXIGA 10 MG TABS tablet TAKE 1 TABLET BY MOUTH BEFORE BREAKFAST (Patient taking differently: Take 10 mg by mouth daily. ) 90 tablet 3  . furosemide (LASIX) 40 MG tablet TAKE 1 TABLET EVERY DAY (Patient taking differently: Take 40 mg by mouth daily. ) 90 tablet 3  . nitroGLYCERIN (NITROSTAT) 0.4 MG SL tablet Place 1 tablet (0.4 mg total) under the tongue every 5 (five) minutes x 3 doses as needed for chest pain. 25 tablet 3  . Omega-3 Fatty Acids (FISH OIL) 1200 MG CAPS Take 3,600 mg by mouth daily.    . rosuvastatin (CRESTOR) 40 MG tablet Take 1 tablet (40 mg total) by mouth at bedtime. 90 tablet 3  . spironolactone (ALDACTONE) 25 MG tablet TAKE 1/2 TABLET BY MOUTH EVERY DAY 45 tablet 3   No current facility-administered medications for this encounter.    Allergies:   Atorvastatin, Sulfonamide derivatives, Tape, and Sulfamethoxazole   Social History:  The patient  reports that he quit smoking about 20 years ago. His smoking use included cigarettes. He smoked 0.10 packs per day. He has never used smokeless tobacco. He reports current alcohol use. He reports that he does not use drugs.   Family History:  The patient's family history includes Cardiomyopathy in his brother; Heart attack (age of onset: 43) in his father.   ROS:  Please see the history of present illness.   All  other systems are personally reviewed and negative.   Vitals:   10/11/20 0949  BP: 118/68  Pulse: (!) 53  SpO2: 97%  Weight: 75.1 kg (165 lb 9.6 oz)    Exam:   General:  Well appearing. No resp difficulty HEENT: normal Neck: supple. no JVD. Carotids 2+ bilat; no bruits. No lymphadenopathy or thryomegaly appreciated. Cor: PMI nondisplaced. Regular rate & rhythm. No rubs, gallops or murmurs. Lungs: clear Abdomen: soft, nontender, nondistended. No hepatosplenomegaly. No bruits or masses. Good bowel sounds. Extremities: no cyanosis, clubbing, rash, edema Neuro: alert & orientedx3, cranial nerves grossly intact. moves all 4 extremities w/o difficulty. Affect pleasant     Recent Labs: 06/14/2020: TSH 5.49 09/13/2020: ALT 29; B Natriuretic Peptide 231.5; BUN 17; Creatinine, Ser 1.00; Hemoglobin 14.2; Platelets 181; Potassium 3.7; Sodium 137  Personally reviewed   Wt Readings from Last 3 Encounters:  10/11/20 75.1 kg (165 lb 9.6 oz)  09/13/20 76.3 kg (168 lb 3.2 oz)  08/30/20 77.2 kg (170 lb 3.2 oz)      ASSESSMENT AND PLAN:  1. PAF with ICD firing in 9/21 - tolerating amio.  - no further ICD firing  2. Chronic Systolic Heart Failure ICM ECHO 08/2013 EF 45-50% Has St Jude ICD 2009 - Echo 07/2016 LVEF 30-35%, Mod MR, Mod LAE, Mildly dilated RV with mild reduced RV function, PA peak pressure 51 mm Hg.  - TEE 11/20 Left ventricular ejection fraction 20-25% Moderately reduced RV function 3+ MR severe TR - Echo 6/21 LV is markedly dilated with EF 15-20% severe MR. RV at least moderately reduced. Severe TR - Improved NYHA II-III. Volume status ok  - CPX 11/20 with preserved VO2 with moderately elevated slope - CPX 7/21 Peak VO2: 27.5 (99% predicted peak VO2)  VE/VCO2 slope: 30. RER 0.95 - Difficult situation. He  has severe biventricular dysfunction with severe MR/TR. But CPX from 7/21 was suprisingly good.  I worry that at some point in the relatively near future his exercise capacity  may fall off quickly but currently he is not candidate for transplant or VAD. I have reviewed his case with structural team and they feel he likely would not benefit from MitraClip (more like [facility] patient). I also discussed with [facility]. Mosetta Pigeon at [facility] recently and based on CPX and h/o relatively recent head & neck cancer risk of transplant/immunosuppression likely outweighs benefits of transplant given his pVO2. Consider repeat CPX in 6 months. - Off Toprol XL 12.5 m with addition of amio and bradycardia -Continue candesartan 4 mg bid (BP has been too soft for Entresto) - Continue Farxiga 10  - Continue spiro 12.5  - Continue lasix 40 daily - Blood Type O positive - Encouraged to return to regular exercise program   3. CAD S/P CABG 2006 -No s/s angina - Coronary angio 10/20 with stable revascularization. -Continue statin. Off ASA with Eliquis use.  4. Hyperlipidemia - Continue Crestor. Goal LDL < 70  - Per PCP - Having myalgias. Will check CK. Trial of stopping/starting Crestor. If helps switch Crestor to qOD.   5. RBBB - stable   6. Throat Cancer - on R side. Squamous Cell Carcinoma with unclear primary.  - s/p chemo[facility] at [facility] in 2018 (Finished treatments in 2/19) - remains cancer free on recent eval  7. Severe mitral regurgitation - plan as above. Echo reviewed by Structural Team felt not to be candidate for MitraClip - no change today    Signed, Glori Bickers, MD  10/11/2020 10:32 AM  Advanced Heart Failure [place] [phone] (office) [phone] (fax)

## 2020-10-11 ENCOUNTER — Encounter (HOSPITAL_COMMUNITY): Payer: Self-pay | Admitting: Internal Medicine

## 2020-10-11 ENCOUNTER — Other Ambulatory Visit: Payer: Self-pay

## 2020-10-11 ENCOUNTER — Ambulatory Visit (HOSPITAL_COMMUNITY)
Admission: RE | Admit: 2020-10-11 | Discharge: 2020-10-11 | Disposition: A | Discharge status: Home / Self Care | Payer: Medicare HMO | Source: Ambulatory Visit | Attending: Internal Medicine | Admitting: Internal Medicine

## 2020-10-11 VITALS — BP 118/68 | HR 53 | Wt 165.6 lb

## 2020-10-11 DIAGNOSIS — I081 Rheumatic disorders of both mitral and tricuspid valves: Secondary | ICD-10-CM | POA: Diagnosis not present

## 2020-10-11 DIAGNOSIS — E785 Hyperlipidemia, unspecified: Secondary | ICD-10-CM | POA: Insufficient documentation

## 2020-10-11 DIAGNOSIS — Z7901 Long term (current) use of anticoagulants: Secondary | ICD-10-CM | POA: Diagnosis not present

## 2020-10-11 DIAGNOSIS — Z95810 Presence of automatic (implantable) cardiac defibrillator: Secondary | ICD-10-CM | POA: Insufficient documentation

## 2020-10-11 DIAGNOSIS — I480 Paroxysmal atrial fibrillation: Secondary | ICD-10-CM | POA: Diagnosis not present

## 2020-10-11 DIAGNOSIS — Z9221 Personal history of antineoplastic chemotherapy: Secondary | ICD-10-CM | POA: Diagnosis not present

## 2020-10-11 DIAGNOSIS — I5022 Chronic systolic (congestive) heart failure: Secondary | ICD-10-CM | POA: Diagnosis not present

## 2020-10-11 DIAGNOSIS — I452 Bifascicular block: Secondary | ICD-10-CM | POA: Diagnosis not present

## 2020-10-11 DIAGNOSIS — I255 Ischemic cardiomyopathy: Secondary | ICD-10-CM | POA: Diagnosis not present

## 2020-10-11 DIAGNOSIS — Z79899 Other long term (current) drug therapy: Secondary | ICD-10-CM | POA: Diagnosis not present

## 2020-10-11 DIAGNOSIS — Z951 Presence of aortocoronary bypass graft: Secondary | ICD-10-CM | POA: Diagnosis not present

## 2020-10-11 DIAGNOSIS — Z8249 Family history of ischemic heart disease and other diseases of the circulatory system: Secondary | ICD-10-CM | POA: Diagnosis not present

## 2020-10-11 DIAGNOSIS — Z87891 Personal history of nicotine dependence: Secondary | ICD-10-CM | POA: Diagnosis not present

## 2020-10-11 DIAGNOSIS — I4510 Unspecified right bundle-branch block: Secondary | ICD-10-CM | POA: Diagnosis not present

## 2020-10-11 DIAGNOSIS — Z923 Personal history of irradiation: Secondary | ICD-10-CM | POA: Diagnosis not present

## 2020-10-11 DIAGNOSIS — Z85818 Personal history of malignant neoplasm of other sites of lip, oral cavity, and pharynx: Secondary | ICD-10-CM | POA: Diagnosis not present

## 2020-10-11 DIAGNOSIS — I2510 Atherosclerotic heart disease of native coronary artery without angina pectoris: Secondary | ICD-10-CM | POA: Insufficient documentation

## 2020-10-11 LAB — BASIC METABOLIC PANEL
Anion gap: 10 (ref 5–15)
BUN: 12 mg/dL (ref 8–23)
CO2: 27 mmol/L (ref 22–32)
Calcium: 9.5 mg/dL (ref 8.9–10.3)
Chloride: 97 mmol/L — ABNORMAL LOW (ref 98–111)
Creatinine, Ser: 0.94 mg/dL (ref 0.61–1.24)
GFR, Estimated: >60 mL/min (ref >60)
Glucose, Bld: 100 mg/dL — ABNORMAL HIGH (ref 70–99)
Potassium: 3.9 mmol/L (ref 3.5–5.1)
Sodium: 134 mmol/L — ABNORMAL LOW (ref 135–145)

## 2020-10-11 LAB — CK: Total CK: 69 U/L (ref 49–397)

## 2020-10-11 LAB — CBC
HCT: 45.5 % (ref 39.0–52.0)
Hemoglobin: 14.8 g/dL (ref 13.0–17.0)
MCH: 29.8 pg (ref 26.0–34.0)
MCHC: 32.5 g/dL (ref 30.0–36.0)
MCV: 91.7 fL (ref 80.0–100.0)
Platelets: 193 10*3/uL (ref 150–400)
RBC: 4.96 MIL/uL (ref 4.22–5.81)
RDW: 12.5 % (ref 11.5–15.5)
WBC: 6.9 10*3/uL (ref 4.0–10.5)
nRBC: 0 % (ref 0.0–0.2)

## 2020-10-11 LAB — BRAIN NATRIURETIC PEPTIDE: B Natriuretic Peptide: 240.7 pg/mL — ABNORMAL HIGH (ref 0.0–100.0)

## 2020-10-11 NOTE — Patient Instructions (Signed)
Lab work done today. We will notify you of any abnormal lab work. No news is good news!!   STOP Crestor for 3 days. Then restart at your normal dose.   Please follow up with the [facility] in 2 months.  At the [facility], you and your health needs are our priority. As part of our continuing mission to provide you with exceptional heart care, we have created designated Provider Care Teams. These Care Teams include your primary Cardiologist (physician) and Advanced Practice Providers (APPs- Physician Assistants and Nurse Practitioners) who all work together to provide you with the care you need, when you need it.   You may see any of the following providers on your designated Care Team at your next follow up: Marland Kitchen Dr Glori Bickers . Dr Loralie Champagne . [name], NP . Lyda Jester, PA . Audry Riles, PharmD   Please be sure to bring in all your medications bottles to every appointment.

## 2020-10-11 NOTE — Addendum Note (Signed)
Encounter addended by: [name], MD on: 10/11/2020 11:23 AM  Actions taken: Level of Service modified, Visit diagnoses modified

## 2020-10-15 ENCOUNTER — Other Ambulatory Visit: Payer: Self-pay | Admitting: Internal Medicine

## 2020-10-25 DIAGNOSIS — R69 Illness, unspecified: Secondary | ICD-10-CM | POA: Diagnosis not present

## 2020-10-30 ENCOUNTER — Ambulatory Visit (INDEPENDENT_AMBULATORY_CARE_PROVIDER_SITE_OTHER): Payer: Medicare HMO

## 2020-10-30 DIAGNOSIS — I428 Other cardiomyopathies: Secondary | ICD-10-CM | POA: Diagnosis not present

## 2020-10-30 DIAGNOSIS — I429 Cardiomyopathy, unspecified: Secondary | ICD-10-CM

## 2020-10-30 DIAGNOSIS — Z0100 Encounter for examination of eyes and vision without abnormal findings: Secondary | ICD-10-CM | POA: Diagnosis not present

## 2020-10-30 DIAGNOSIS — H52223 Regular astigmatism, bilateral: Secondary | ICD-10-CM | POA: Diagnosis not present

## 2020-10-30 LAB — CUP PACEART REMOTE DEVICE CHECK
Battery Remaining Longevity: 69 mo
Battery Remaining Percentage: 69 %
Battery Voltage: 2.96 V
Brady Statistic RV Percent Paced: 1 %
Date Time Interrogation Session: 20211116040016
HighPow Impedance: 52 Ohm
HighPow Impedance: 52 Ohm
Implantable Lead Implant Date: 20090618
Implantable Lead Location: 753860
Implantable Lead Model: 7121
Implantable Pulse Generator Implant Date: 20180220
Lead Channel Impedance Value: 400 Ohm
Lead Channel Pacing Threshold Amplitude: 1 V
Lead Channel Pacing Threshold Pulse Width: 0.8 ms
Lead Channel Sensing Intrinsic Amplitude: 3.4 mV
Lead Channel Setting Pacing Amplitude: 2.5 V
Lead Channel Setting Pacing Pulse Width: 0.8 ms
Lead Channel Setting Sensing Sensitivity: 0.5 mV
Pulse Gen Serial Number: 7406967

## 2020-11-01 NOTE — Progress Notes (Signed)
Remote ICD transmission.   

## 2020-11-22 ENCOUNTER — Telehealth: Payer: Self-pay | Admitting: Pharmacist

## 2020-11-22 NOTE — Progress Notes (Addendum)
Chronic Care Management Pharmacy Assistant   Name: WEBSTER PATRONE Sr.  MRN: [id number] DOB: December 19, 1950  Reason for Encounter: Disease State  Patient Questions:  1.  Have you seen any other providers since your last visit? Yes  2.  Any changes in your medicines or health? Yes    PCP : Colon Branch, MD   Their chronic conditions include: Pre-DM, HF, HLD, CAD, Gout, Pain, Dry Eye, Allergies.  Office Visits: None since their last CCM visit with the clinical pharmacist on 08-28-2020.  Consults: 09-21-2020 Cardio; Vasc) Patient presented in the office for a f/u. Notes indicate patient having myalgias. Will check CK. Trial of stopping/starting Crestor. If helps switch Crestor to qOD.  09-13-2020 (Cardio; Vasc) Patient presented in the office with Bensimhom . Medication changes: Amiodarone 200 mg tab daily. Stop Toprol XL 12.5 mg BID. Patient's Echo was reviewed by Structural Team felt not to be candidate for MitraClip. Lab work was obtained.  08-30-2020 (Cardio) Patient presented in the office for a f/u. Patient reported that he has noticed some palpitations as well as increased shortness of breath. Medication changes: Eliquis 5 mg tabs twice a day. Aspirin 81 mg was discontinued.   Allergies:   Allergies  Allergen Reactions   Atorvastatin      myalgia, Muscle aching   Sulfonamide Derivatives     Rash and hot flashes   Tape Other (See Comments)    Rips skin- use PAPER tape   Sulfamethoxazole Rash and Other (See Comments)    Hot flashes    Medications: Outpatient Encounter Medications as of 11/22/2020  Medication Sig   acetaminophen (TYLENOL) 325 MG tablet Take 650 mg by mouth every 6 (six) hours as needed (for pain.).   amiodarone (PACERONE) 200 MG tablet Take 1 tablet (200 mg total) by mouth daily.   apixaban (ELIQUIS) 5 MG TABS tablet Take 1 tablet (5 mg total) by mouth 2 (two) times daily.   candesartan (ATACAND) 4 MG tablet Take 1 tablet (4 mg total) by mouth at bedtime.    carboxymethylcellul-glycerin (OPTIVE) 0.5-0.9 % ophthalmic solution Place 1 drop into both eyes daily.   cetirizine (ZYRTEC) 10 MG tablet Take 10 mg by mouth daily.     Colchicine (MITIGARE) 0.6 MG CAPS Take 0.6 mg by mouth 2 (two) times daily as needed (gout flare).   FARXIGA 10 MG TABS tablet TAKE 1 TABLET BY MOUTH BEFORE BREAKFAST (Patient taking differently: Take 10 mg by mouth daily. )   furosemide (LASIX) 40 MG tablet TAKE 1 TABLET EVERY DAY (Patient taking differently: Take 40 mg by mouth daily. )   nitroGLYCERIN (NITROSTAT) 0.4 MG SL tablet Place 1 tablet (0.4 mg total) under the tongue every 5 (five) minutes x 3 doses as needed for chest pain.   Omega-3 Fatty Acids (FISH OIL) 1200 MG CAPS Take 3,600 mg by mouth daily.   rosuvastatin (CRESTOR) 40 MG tablet Take 1 tablet (40 mg total) by mouth at bedtime.   spironolactone (ALDACTONE) 25 MG tablet TAKE 1/2 TABLET BY MOUTH EVERY DAY   No facility-administered encounter medications on file as of 11/22/2020.    Current Diagnosis: Patient Active Problem List   Diagnosis Date Noted   Heme + stool    Internal and external prolapsed hemorrhoids    Difficulty swallowing 01/21/2018   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 [facility]) 12/24/2017   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 [facility]) 11/12/2017  PCP NOTES >>>>>>>>>>>>>>>>>>>>> 04/21/2016   Seborrheic dermatitis of scalp 04/11/2013   Annual physical exam 04/11/2011   MITRAL REGURGITATION 01/77/9390   SYSTOLIC HEART FAILURE, CHRONIC 10/03/2009   Hyperglycemia 06/19/2008   Coronary atherosclerosis 06/14/2008   Automatic implantable cardioverter-defibrillator in situ 06/01/2008   ERECTILE DYSFUNCTION 12/17/2007   Hyperlipidemia 02/06/2007   Gout 02/06/2007   RBBB 02/06/2007   Congestive heart failure ([place]) 02/06/2007   CORONARY ARTERY BYPASS GRAFT, FOUR VESSEL, HX OF 02/12/2005    Goals Addressed   None     Contacted the patient for a General Adherence check in. Patient stated he was feeling better than he had before considering all the changes that he has made with medication. He is no longer experiencing any shortness of breathe and did not have a concern with edema. He also stated his weight has been consistent. Inquired if he was tolerating the medication changes without side effects. Patient stated has not been experiencing any side effects and does not have any issues obtaining his medication.   Follow-Up:  Pharmacist Review   Fanny Skates, [place] Pharmacist Assistant [phone]  8 minutes spent in review, coordination, and documentation.   Reviewed by: De Blanch, PharmD, BCACP Clinical Pharmacist [facility] [phone]

## 2020-11-30 ENCOUNTER — Other Ambulatory Visit: Payer: Self-pay

## 2020-11-30 ENCOUNTER — Ambulatory Visit: Payer: Medicare HMO | Admitting: Internal Medicine

## 2020-11-30 ENCOUNTER — Encounter: Payer: Self-pay | Admitting: Internal Medicine

## 2020-11-30 VITALS — BP 102/68 | HR 52 | Ht 69.0 in | Wt 162.2 lb

## 2020-11-30 DIAGNOSIS — Z95810 Presence of automatic (implantable) cardiac defibrillator: Secondary | ICD-10-CM | POA: Diagnosis not present

## 2020-11-30 DIAGNOSIS — I5022 Chronic systolic (congestive) heart failure: Secondary | ICD-10-CM

## 2020-11-30 LAB — CUP PACEART INCLINIC DEVICE CHECK
Battery Remaining Longevity: 72 mo
Brady Statistic RV Percent Paced: 0.77 %
Date Time Interrogation Session: 20211217121935
HighPow Impedance: 57.8767
Implantable Lead Implant Date: 20090618
Implantable Lead Location: 753860
Implantable Lead Model: 7121
Implantable Pulse Generator Implant Date: 20180220
Lead Channel Impedance Value: 437.5 Ohm
Lead Channel Pacing Threshold Amplitude: 2 V
Lead Channel Pacing Threshold Amplitude: 2 V
Lead Channel Pacing Threshold Pulse Width: 0.6 ms
Lead Channel Pacing Threshold Pulse Width: 0.6 ms
Lead Channel Sensing Intrinsic Amplitude: 4.1 mV
Lead Channel Setting Pacing Amplitude: 3.5 V
Lead Channel Setting Pacing Pulse Width: 0.6 ms
Lead Channel Setting Sensing Sensitivity: 0.5 mV
Pulse Gen Serial Number: 7406967

## 2020-11-30 NOTE — Patient Instructions (Addendum)
Medication Instructions:  Your physician recommends that you continue on your current medications as directed. Please refer to the Current Medication list given to you today.  Labwork: None ordered.  Testing/Procedures: None ordered.  Follow-Up: Your physician wants you to follow-up in: 6 months with [name].   You will receive a reminder letter in the mail two months in advance. If you don't receive a letter, please call our office to schedule the follow-up appointment.  Remote monitoring is used to monitor your ICD from home. This monitoring reduces the number of office visits required to check your device to one time per year. It allows [place] to keep an eye on the functioning of your device to ensure it is working properly. You are scheduled for a device check from home on 01/29/2021. You may send your transmission at any time that day. If you have a wireless device, the transmission will be sent automatically. After your physician reviews your transmission, you will receive a postcard with your next transmission date.  Any Other Special Instructions Will Be Listed Below (If Applicable).  If you need a refill on your cardiac medications before your next appointment, please call your pharmacy.

## 2020-11-30 NOTE — Progress Notes (Signed)
HPI [name] returns today for followup. He is a pleasant 69 yo man with a h/o chronic systolic heart failure, HTN, throat CA, s/p ICD insertion. He has had an episode of VT for which he was defibrillated back in September while playing golf. He denies chest pain or sob. He has lost weight but has a diagnosis of throat CA.  Allergies  Allergen Reactions  . Atorvastatin      myalgia, Muscle aching  . Sulfonamide Derivatives     Rash and hot flashes  . Tape Other (See Comments)    Rips skin- use PAPER tape  . Sulfamethoxazole Rash and Other (See Comments)    Hot flashes     Current Outpatient Medications  Medication Sig Dispense Refill  . acetaminophen (TYLENOL) 325 MG tablet Take 650 mg by mouth every 6 (six) hours as needed (for pain.).    Marland Kitchen amiodarone (PACERONE) 200 MG tablet Take 1 tablet (200 mg total) by mouth daily. 30 tablet 3  . apixaban (ELIQUIS) 5 MG TABS tablet Take 1 tablet (5 mg total) by mouth 2 (two) times daily. 60 tablet 11  . candesartan (ATACAND) 4 MG tablet Take 1 tablet (4 mg total) by mouth at bedtime. 90 tablet 2  . carboxymethylcellul-glycerin (OPTIVE) 0.5-0.9 % ophthalmic solution Place 1 drop into both eyes daily.    . cetirizine (ZYRTEC) 10 MG tablet Take 10 mg by mouth as needed for allergies.    . Colchicine (MITIGARE) 0.6 MG CAPS Take 0.6 mg by mouth 2 (two) times daily as needed (gout flare). 60 capsule 0  . FARXIGA 10 MG TABS tablet TAKE 1 TABLET BY MOUTH BEFORE BREAKFAST 90 tablet 3  . furosemide (LASIX) 40 MG tablet TAKE 1 TABLET EVERY DAY 90 tablet 3  . nitroGLYCERIN (NITROSTAT) 0.4 MG SL tablet Place 1 tablet (0.4 mg total) under the tongue every 5 (five) minutes x 3 doses as needed for chest pain. 25 tablet 3  . Omega-3 Fatty Acids (FISH OIL) 1200 MG CAPS Take 3,600 mg by mouth daily.    . rosuvastatin (CRESTOR) 40 MG tablet Take 1 tablet (40 mg total) by mouth at bedtime. 90 tablet 3  . spironolactone (ALDACTONE) 25 MG tablet TAKE 1/2  TABLET BY MOUTH EVERY DAY 45 tablet 3   No current facility-administered medications for this visit.     Past Medical History:  Diagnosis Date  . AICD (automatic cardioverter/defibrillator) present 6/09   [facility]  . Bradycardia    Use of beta blocker limited  . CAD (coronary artery disease)    a- S/p cabg in march 2006 by Dr.Owen;  b. myoview 5/12: large IL scar from apex to base, no ischemia, EF 37%  . Cancer ([facility])    on right side of neck  . ED (erectile dysfunction)   . Gout   . Hyperlipidemia   . Ischemic cardiomyopathy    a-Echocardiogram, Nov 2006, showed ejection fraction of 30-40% with mild to moderated mitral  regurgitation and mild aortic regurgitation b- Cardiac MRI, May 2007, EF of 44% with 50% scar involving  the inferolateral walls. No comment of mitral regurgitation. c- last echo  11/10: EF 30-35%  mod MR d- Nega T-Wave alternans testing in May of 2007 e- no ACE due to low BP;  f. CPX 3/11: normal  . 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 Systolic CHF, chronic (HCC)     ROS:   All systems  reviewed and negative except as noted in the HPI.   Past Surgical History:  Procedure Laterality Date  . CARDIAC DEFIBRILLATOR PLACEMENT  06/01/08   ICD  . COLONOSCOPY    . COLONOSCOPY WITH PROPOFOL N/A 07/28/2019   Procedure: COLONOSCOPY WITH PROPOFOL;  Surgeon: Matthew Mayer, MD;  Location: [facility];  Service: [facility];  Laterality: N/A;  . CORONARY ARTERY BYPASS GRAFT  02-2005  . ICD GENERATOR CHANGEOUT N/A 02/03/2017   Procedure: ICD Generator Changeout;  Surgeon: Matthew Lance, MD;  Location: [facility];  Service: Cardiovascular;  Laterality: N/A;  . ORIF [place]   left   . RIGHT/LEFT HEART CATH AND CORONARY/GRAFT ANGIOGRAPHY N/A 09/20/2019   Procedure: RIGHT/LEFT HEART CATH AND CORONARY/GRAFT ANGIOGRAPHY;  Surgeon: Matthew Artist, MD;  Location: [facility];  Service: Cardiovascular;  Laterality:  N/A;  . SHOULDER ARTHROSCOPY  02/2010   rt   . TEE WITHOUT CARDIOVERSION N/A 10/24/2019   Procedure: TRANSESOPHAGEAL ECHOCARDIOGRAM (TEE);  Surgeon: Matthew Artist, MD;  Location: [facility];  Service: Cardiovascular;  Laterality: N/A;  . TONSILLECTOMY     as a child     Family History  Problem Relation Age of Onset  . Heart attack Father 82  . Cardiomyopathy Brother   . Colon cancer Neg Hx   . Prostate cancer Neg Hx   . Diabetes Neg Hx      Social History   Socioeconomic History  . Marital status: Married    Spouse name: Not on file  . Number of children: 2  . Years of education: Not on file  . Highest education level: Not on file  Occupational History  . 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 Smoking status: Former Smoker    Packs/day: 0.10    Types: Cigarettes    Quit date: 12/16/1999    Years since quitting: 20.9  . Smokeless tobacco: Never Used  Vaping Use  . Vaping Use: Never used  Substance and Sexual Activity  . Alcohol use: Yes    Comment: socially  . Drug use: No  . Sexual activity: Not on file  Other Topics Concern  . Not on file  Social History Narrative   Retired and happy about it    household- pt and wife   2 adult children in [place] also has grandchildren   Former but not current smoker, occasional alcohol no drug use   Social Determinants of [facility] Strain: Low Risk   . Difficulty of Paying Living Expenses: Not very hard  Food Insecurity: Not on file  Transportation Needs: Not on file  Physical Activity: Not on file  Stress: Not on file  Social Connections: Not on file  Intimate Partner Violence: Not on file     BP 102/68   Pulse (!) 52   Ht 5\' 9"  (1.753 m)   Wt 162 lb 3.2 oz (73.6 kg)   SpO2 95%   BMI 23.95 kg/m   Physical Exam:  Well appearing 69 yo man, NAD HEENT: Unremarkable Neck:  6 cm JVD, no  thyromegally Lymphatics:  No adenopathy Back:  No CVA tenderness Lungs:  Clear with no wheezes HEART:  Regular rate rhythm, no murmurs, no rubs, no clicks Abd:  soft, positive bowel sounds, no organomegally, no rebound, no guarding Ext:  2 plus pulses, no edema, no cyanosis, no clubbing Skin:  No rashes no nodules Neuro:  CN II through XII intact, motor grossly intact  EKG - NSR with RBBB  DEVICE  Normal device function.  See PaceArt for details.   Assess/Plan: 1. VT - he has done well on low dose amiodarone. He will continue. 2. PAF - he is maintaining NSR very nicely 3. Chronic systolic heart failure - his symptoms remain class 2. No change in his meds. 4. ICD -his [facility] single chamber ICD is working normally. We will recheck in several months,.  Carleene Overlie Haniel Fix,MD

## 2020-12-05 ENCOUNTER — Other Ambulatory Visit (HOSPITAL_COMMUNITY): Payer: Self-pay | Admitting: Internal Medicine

## 2020-12-17 ENCOUNTER — Other Ambulatory Visit: Payer: Self-pay

## 2020-12-18 ENCOUNTER — Other Ambulatory Visit (INDEPENDENT_AMBULATORY_CARE_PROVIDER_SITE_OTHER): Payer: Medicare HMO

## 2020-12-18 ENCOUNTER — Other Ambulatory Visit: Payer: Self-pay

## 2020-12-18 DIAGNOSIS — E038 Other specified hypothyroidism: Secondary | ICD-10-CM

## 2020-12-18 LAB — TSH: TSH: 15.3 u[IU]/mL — ABNORMAL HIGH (ref 0.35–4.50)

## 2020-12-20 MED ORDER — LEVOTHYROXINE SODIUM 25 MCG PO TABS: 25.0000 ug | ORAL_TABLET | Freq: Every day | ORAL | 2 refills | Status: DC

## 2020-12-20 NOTE — Addendum Note (Signed)
Addended byConrad [place] D on: 12/20/2020 04:42 PM   Modules accepted: Orders

## 2020-12-23 NOTE — Progress Notes (Signed)
Advanced [facility] Failure Clinic Note  Date:  12/23/2020   ID:  Matthew Romans Sr., DOB 01/11/1951, MRN [id number]  Location: Home  Provider location: [facility] Type of Visit: Established patient  PCP:  Colon Branch, MD  Cardiologist:  No primary care provider on file. Primary HF: Mechel Schutter  Chief Complaint: Heart Failure follow-up   History of Present Illness:  Matthew Joyce is a89 y.o.malewith a history of a CAD S/P CABG in 2006 (LIMA -> LAD, SVG -> Ramus, SVG -> OM-2, SVG -> RCA), s/p ICD, ischemic MR, DM, hyperlipidemia, and throat cancer s/p chemo/XRT in 2018  Stress test 08/14/2016 with EF 31%, old infarct, no ischemia.  Developed CP and SOB in early October. Cath on 09/20/19. Coronary anatomy was stable but EF down to 20% on cath with 3+ MR Echo on 09/20/19 read as EF 25-30% with mod MR. (I reviewed echo and EF 20-25% and moderate to severe MR). Since that time has had CPX with only mild HF limitation.  TEE EF 20-25%. 3+MR and severe TR.   On 01/29/20 woke from sleep for unknown reasons. Found to have shock for VT/VF. Saw [name]   On 07/22/20 had multiple runs of NSVT, SVT and 1 run of VT (20 seconds max rate 220). Felt to have gotten inappropriate ATP for AF.   Seen in 9/21 for ICD firing due to AF with RVR. Started on amio and b-blocker stopped due to bradycardia.   Echo 05/29/20: EF 20% mod to severe RV dysfunction severe central MR Personally reviewed  Here for routine f/u. Feels pretty good. Has been playing golf but for past 2 weeks has been too cold. No CP. Occasional dyspnea. No edema, orthopnea or PND. Gets winded bending over. No ICD firings. SBP 100-120   Cath 09/20/19   1. 3v CAD  2. Patent LIMA to LAD 3. Patent free RIMA to ramus 4. Occluded SVG to RCA with widely patent native RCA 5. Unable to locate SVG to OM2 mentioned in OP report. There is no graft marker for this graft and Ar root shot did not show any SVG to OM (suspect  may not have been placed) 6. EF 20% with global HK and 3+ MR   Ao = 108/53 (72) LV = 109/19 RA = 9 RV = 60/10 PA = 63/23 (36) PCW = 22 (v = 35-40) Fick cardiac output/index = 4.5/2.4 PVR = 3.0 WU SVR 1310 Ao sat = 99% PA sat = 66%, 69%  CPX 06/2020  FVC 3.54 (85%)    FEV1 2.81 (89%)     FEV1/FVC 79 (104%)     MVV 104 (82%)      Resting HR: 43 Standing HR:47 Peak HR: 104  (69% age predicted max HR)  BP rest: 84/60 Standing BP: 76/58 BP peak: 108/60  Peak VO2: 27.5 (99% predicted peak VO2)  VE/VCO2 slope: 30  OUES: 1.99  Peak RER: 0.95  Ventilatory Threshold: 23.7 (85% predicted or measured peak VO2)  Peak RR 39  Peak Ventilation: 63.0  VE/MVV: 61%  PETCO2 at peak: 33  O2pulse: 19  (146% predicted O2pulse)    CPX test 10/20/19   FVC 3.22 (81%)    FEV1 2.54 (84%)     FEV1/FVC 79 (103%)     MVV 92 (76%)     Resting HR: 44 Standing HR: 47 Peak HR: 107  (71% age predicted max HR)  BP rest: 92/56 Standing BP: 86/52 BP peak:  116/62  Peak VO2: 24.5 (88% predicted peak VO2)  VE/VCO2 slope: 37  OUES: 2.11  Peak RER: 1.01 O2pulse: 19  (127% predicted O2pulse)    TEE 10/24/19 1. Left ventricular ejection fraction 20-25% 2. Global right ventricle has moderately reduced systolic function. 3. Left atrial size was severely dilated. 4. No LAA clot. 5. Right atrial size was mild-moderately dilated. 6. The mitral valve is abnormal. Moderate mitral valve regurgitation. 7. Mildly restricted posterior MV leaflet with 3+ MR. 8. The tricuspid valve is normal in structure. Tricuspid valve regurgitation is severe.  Past Medical History:  Diagnosis Date  . AICD (automatic cardioverter/defibrillator) present 6/09   [employer]  . Bradycardia    Use of beta blocker limited  . CAD (coronary artery disease)    a- S/p cabg in march 2006 by Dr.Owen;  b. myoview 5/12: large IL scar from apex to base, no ischemia, EF 37%  . Cancer ([place])    on  right side of neck  . ED (erectile dysfunction)   . Gout   . Hyperlipidemia   . Ischemic cardiomyopathy    a-Echocardiogram, Nov 2006, showed ejection fraction of 30-40% with mild to moderated mitral  regurgitation and mild aortic regurgitation b- Cardiac MRI, May 2007, EF of 44% with 50% scar involving  the inferolateral walls. No comment of mitral regurgitation. c- last echo  11/10: EF 30-35%  mod MR d- Nega T-Wave alternans testing in May of 2007 e- no ACE due to low BP;  f. CPX 3/11: normal  . 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 Systolic CHF, chronic ([place])    Past Surgical History:  Procedure Laterality Date  . CARDIAC DEFIBRILLATOR PLACEMENT  06/01/08   ICD  . COLONOSCOPY    . COLONOSCOPY WITH PROPOFOL N/A 07/28/2019   Procedure: COLONOSCOPY WITH PROPOFOL;  Surgeon: [name], MD;  Location: [facility];  Service: Endoscopy;  Laterality: N/A;  . CORONARY ARTERY BYPASS GRAFT  02-2005  . ICD GENERATOR CHANGEOUT N/A 02/03/2017   Procedure: ICD Generator Changeout;  Surgeon: [name], MD;  Location: [facility];  Service: Cardiovascular;  Laterality: N/A;  . ORIF [place]   left   . RIGHT/LEFT HEART CATH AND CORONARY/GRAFT ANGIOGRAPHY N/A 09/20/2019   Procedure: RIGHT/LEFT HEART CATH AND CORONARY/GRAFT ANGIOGRAPHY;  Surgeon: [name], MD;  Location: [facility];  Service: Cardiovascular;  Laterality: N/A;  . SHOULDER ARTHROSCOPY  02/2010   rt   . TEE WITHOUT CARDIOVERSION N/A 10/24/2019   Procedure: TRANSESOPHAGEAL ECHOCARDIOGRAM (TEE);  Surgeon: [name], MD;  Location: [facility] ENDOSCOPY;  Service: Cardiovascular;  Laterality: N/A;  . TONSILLECTOMY     as a child     Current Outpatient Medications  Medication Sig Dispense Refill  . acetaminophen (TYLENOL) 325 MG tablet Take 650 mg by mouth every 6 (six) hours as needed (for pain.).    Marland Kitchen amiodarone (PACERONE) 200 MG tablet TAKE 1 TABLET BY MOUTH  EVERY DAY 30 tablet 3  . apixaban (ELIQUIS) 5 MG TABS tablet Take 1 tablet (5 mg total) by mouth 2 (two) times daily. 60 tablet 11  . candesartan (ATACAND) 4 MG tablet Take 1 tablet (4 mg total) by mouth at bedtime. 90 tablet 2  . carboxymethylcellul-glycerin (OPTIVE) 0.5-0.9 % ophthalmic solution Place 1 drop into both eyes daily.    . cetirizine (ZYRTEC) 10 MG tablet Take 10 mg by mouth as needed for allergies.    Marland Kitchen  Colchicine (MITIGARE) 0.6 MG CAPS Take 0.6 mg by mouth 2 (two) times daily as needed (gout flare). 60 capsule 0  . FARXIGA 10 MG TABS tablet TAKE 1 TABLET BY MOUTH BEFORE BREAKFAST 90 tablet 3  . furosemide (LASIX) 40 MG tablet TAKE 1 TABLET EVERY DAY 90 tablet 3  . levothyroxine (SYNTHROID) 25 MCG tablet Take 1 tablet (25 mcg total) by mouth daily before breakfast. 30 tablet 2  . nitroGLYCERIN (NITROSTAT) 0.4 MG SL tablet Place 1 tablet (0.4 mg total) under the tongue every 5 (five) minutes x 3 doses as needed for chest pain. 25 tablet 3  . Omega-3 Fatty Acids (FISH OIL) 1200 MG CAPS Take 3,600 mg by mouth daily.    . rosuvastatin (CRESTOR) 40 MG tablet Take 1 tablet (40 mg total) by mouth at bedtime. 90 tablet 3  . spironolactone (ALDACTONE) 25 MG tablet TAKE 1/2 TABLET BY MOUTH EVERY DAY 45 tablet 3   No current facility-administered medications for this encounter.    Allergies:   Atorvastatin, Sulfonamide derivatives, Tape, and Sulfamethoxazole   Social History:  The patient  reports that he quit smoking about 21 years ago. His smoking use included cigarettes. He smoked 0.10 packs per day. He has never used smokeless tobacco. He reports current alcohol use. He reports that he does not use drugs.   Family History:  The patient's family history includes Cardiomyopathy in his brother; Heart attack (age of onset: 62) in his father.   ROS:  Please see the history of present illness.   All other systems are personally reviewed and negative.   Vitals:   12/25/20 0906  BP: 120/70   Pulse: (!) 49  SpO2: 99%  Weight: 76.1 kg (167 lb 12.8 oz)    Exam:   General:  Well appearing. No resp difficulty HEENT: normal Neck: supple. no JVD. Carotids 2+ bilat; no bruits. No lymphadenopathy or thryomegaly appreciated. Cor: PMI nondisplaced. Brady regular rate & rhythm. No rubs, gallops or murmurs. Lungs: clear Abdomen: soft, nontender, nondistended. No hepatosplenomegaly. No bruits or masses. Good bowel sounds. Extremities: no cyanosis, clubbing, rash, edema Neuro: alert & orientedx3, cranial nerves grossly intact. moves all 4 extremities w/o difficulty. Affect pleasant  ECG: sinus brady 46 RBBB. Personally reviewed  ICD: volume ok. No VT/AF Personally reviewed    Recent Labs: 09/13/2020: ALT 29 10/11/2020: B Natriuretic Peptide 240.7; BUN 12; Creatinine, Ser 0.94; Hemoglobin 14.8; Platelets 193; Potassium 3.9; Sodium 134 12/18/2020: TSH 15.30  Personally reviewed   Wt Readings from Last 3 Encounters:  11/30/20 73.6 kg (162 lb 3.2 oz)  10/11/20 75.1 kg (165 lb 9.6 oz)  09/13/20 76.3 kg (168 lb 3.2 oz)      ASSESSMENT AND PLAN:  1. Chronic Systolic Heart Failure ICM ECHO 08/2013 EF 45-50% Has St Jude ICD 2009 - Echo 07/2016 LVEF 30-35%, Mod MR, Mod LAE, Mildly dilated RV with mild reduced RV function, PA peak pressure 51 mm Hg.  - TEE 11/20 Left ventricular ejection fraction 20-25% Moderately reduced RV function 3+ MR severe TR - Echo 6/21 LV is markedly dilated with EF 15-20% severe MR. RV at least moderately reduced. Severe TR - Stable NYHA II-III. Volume status ok on lasix 40 daily.  - CPX 11/20 with preserved VO2 with moderately elevated slope - CPX 7/21 Peak VO2: 27.5 (99% predicted peak VO2)  VE/VCO2 slope: 30. RER 0.95 - Difficult situation. He has severe biventricular dysfunction with severe MR/TR. But CPX from 7/21 was suprisingly good.  I worry  that at some point in the relatively near future his exercise capacity may fall off quickly but currently he is  not candidate for transplant or VAD. I have reviewed his case with structural team and they feel he likely would not benefit from MitraClip (more like [place] patient). I also discussed with Dr. Mosetta Pigeon at [facility] recently and based on CPX and h/o relatively recent head & neck cancer risk of transplant/immunosuppression likely outweighs benefits of transplant given his pVO2. Consider repeat CPX in 6 months. - Off Toprol XL 12.5 mg with addition of amio and bradycardia -Continue candesartan 4 mg bid will see if BP can tolerate switch to Entresto 24/26 bid - Continue Farxiga 10  - Continue spiro 12.5  - Continue lasix 40 daily - Blood Type O positive   2. PAF with ICD firing in 9/21 - in NSR. Continue amio. With bradycardia may have to cut back to 100 - no further ICD firing  3. CAD S/P CABG 2006 -No s/s angina - Coronary angio 10/20 with stable revascularization. -Continue statin. Off ASA with Eliquis use.  4. Hyperlipidemia - Continue Crestor. Goal LDL < 70  - Per PCP - Having myalgias. Will check CK. Trial of stopping/starting Crestor. If helps switch Crestor to qOD.   5. RBBB - stable   6. Throat Cancer - on R side. Squamous Cell Carcinoma with unclear primary.  - s/p chemo/XRT at [facility] in 2018 (Finished treatments in 2/19) - remains cancer free on recent eval  7. Severe mitral regurgitation - plan as above. Echo reviewed by Structural Team felt not to be candidate for MitraClip - no change    Signed, Glori Bickers, MD  12/23/2020 9:40 PM  Advanced Heart Failure [place] [address] Heart and [place] [address] [phone] (office) [phone] (fax)

## 2020-12-25 ENCOUNTER — Ambulatory Visit (HOSPITAL_COMMUNITY)
Admission: RE | Admit: 2020-12-25 | Discharge: 2020-12-25 | Disposition: A | Discharge status: Home / Self Care | Payer: Medicare HMO | Source: Ambulatory Visit | Attending: Internal Medicine | Admitting: Internal Medicine

## 2020-12-25 ENCOUNTER — Other Ambulatory Visit: Payer: Self-pay

## 2020-12-25 ENCOUNTER — Encounter (HOSPITAL_COMMUNITY): Payer: Self-pay | Admitting: Internal Medicine

## 2020-12-25 VITALS — BP 120/70 | HR 49 | Wt 167.8 lb

## 2020-12-25 DIAGNOSIS — Z8249 Family history of ischemic heart disease and other diseases of the circulatory system: Secondary | ICD-10-CM | POA: Insufficient documentation

## 2020-12-25 DIAGNOSIS — I480 Paroxysmal atrial fibrillation: Secondary | ICD-10-CM | POA: Diagnosis not present

## 2020-12-25 DIAGNOSIS — Z95810 Presence of automatic (implantable) cardiac defibrillator: Secondary | ICD-10-CM | POA: Insufficient documentation

## 2020-12-25 DIAGNOSIS — Z7984 Long term (current) use of oral hypoglycemic drugs: Secondary | ICD-10-CM | POA: Insufficient documentation

## 2020-12-25 DIAGNOSIS — Z882 Allergy status to sulfonamides status: Secondary | ICD-10-CM | POA: Diagnosis not present

## 2020-12-25 DIAGNOSIS — Z9221 Personal history of antineoplastic chemotherapy: Secondary | ICD-10-CM | POA: Diagnosis not present

## 2020-12-25 DIAGNOSIS — Z923 Personal history of irradiation: Secondary | ICD-10-CM | POA: Insufficient documentation

## 2020-12-25 DIAGNOSIS — Z7901 Long term (current) use of anticoagulants: Secondary | ICD-10-CM | POA: Insufficient documentation

## 2020-12-25 DIAGNOSIS — I4510 Unspecified right bundle-branch block: Secondary | ICD-10-CM | POA: Diagnosis not present

## 2020-12-25 DIAGNOSIS — I255 Ischemic cardiomyopathy: Secondary | ICD-10-CM | POA: Diagnosis not present

## 2020-12-25 DIAGNOSIS — E785 Hyperlipidemia, unspecified: Secondary | ICD-10-CM | POA: Diagnosis not present

## 2020-12-25 DIAGNOSIS — I2510 Atherosclerotic heart disease of native coronary artery without angina pectoris: Secondary | ICD-10-CM | POA: Diagnosis not present

## 2020-12-25 DIAGNOSIS — Z87891 Personal history of nicotine dependence: Secondary | ICD-10-CM | POA: Insufficient documentation

## 2020-12-25 DIAGNOSIS — Z85819 Personal history of malignant neoplasm of unspecified site of lip, oral cavity, and pharynx: Secondary | ICD-10-CM | POA: Diagnosis not present

## 2020-12-25 DIAGNOSIS — M109 Gout, unspecified: Secondary | ICD-10-CM | POA: Diagnosis not present

## 2020-12-25 DIAGNOSIS — I5022 Chronic systolic (congestive) heart failure: Secondary | ICD-10-CM | POA: Insufficient documentation

## 2020-12-25 DIAGNOSIS — E119 Type 2 diabetes mellitus without complications: Secondary | ICD-10-CM | POA: Diagnosis not present

## 2020-12-25 DIAGNOSIS — Z79899 Other long term (current) drug therapy: Secondary | ICD-10-CM | POA: Diagnosis not present

## 2020-12-25 DIAGNOSIS — I081 Rheumatic disorders of both mitral and tricuspid valves: Secondary | ICD-10-CM | POA: Diagnosis not present

## 2020-12-25 DIAGNOSIS — Z888 Allergy status to other drugs, medicaments and biological substances status: Secondary | ICD-10-CM | POA: Diagnosis not present

## 2020-12-25 DIAGNOSIS — Z951 Presence of aortocoronary bypass graft: Secondary | ICD-10-CM | POA: Diagnosis not present

## 2020-12-25 LAB — BASIC METABOLIC PANEL
Anion gap: 9 (ref 5–15)
BUN: 19 mg/dL (ref 8–23)
CO2: 28 mmol/L (ref 22–32)
Calcium: 9.4 mg/dL (ref 8.9–10.3)
Chloride: 99 mmol/L (ref 98–111)
Creatinine, Ser: 0.99 mg/dL (ref 0.61–1.24)
GFR, Estimated: >60 mL/min (ref >60)
Glucose, Bld: 115 mg/dL — ABNORMAL HIGH (ref 70–99)
Potassium: 4 mmol/L (ref 3.5–5.1)
Sodium: 136 mmol/L (ref 135–145)

## 2020-12-25 LAB — CBC
HCT: 47.1 % (ref 39.0–52.0)
Hemoglobin: 14.9 g/dL (ref 13.0–17.0)
MCH: 29.3 pg (ref 26.0–34.0)
MCHC: 31.6 g/dL (ref 30.0–36.0)
MCV: 92.7 fL (ref 80.0–100.0)
Platelets: 204 10*3/uL (ref 150–400)
RBC: 5.08 MIL/uL (ref 4.22–5.81)
RDW: 13.9 % (ref 11.5–15.5)
WBC: 6.3 10*3/uL (ref 4.0–10.5)
nRBC: 0 % (ref 0.0–0.2)

## 2020-12-25 LAB — BRAIN NATRIURETIC PEPTIDE: B Natriuretic Peptide: 194.7 pg/mL — ABNORMAL HIGH (ref 0.0–100.0)

## 2020-12-25 MED ORDER — ENTRESTO 24-26 MG PO TABS: 1.0000 | ORAL_TABLET | Freq: Two times a day (BID) | ORAL | 3 refills | Status: DC

## 2020-12-25 NOTE — Patient Instructions (Signed)
Sto Candesartan  Start Entresto 24/26 mg Twice daily **If you can not tolerate this medication you can change back to the Candesartan, but please let us know**  Labs done today, your results will be available in MyChart, we will contact you for abnormal readings.  Your physician recommends that you schedule a follow-up appointment in: 4 months  If you have any questions or concerns before your next appointment please send [place] a message through [place] or call our office at [phone].    TO LEAVE A MESSAGE FOR THE NURSE SELECT OPTION 2, PLEASE LEAVE A MESSAGE INCLUDING: . YOUR NAME . DATE OF BIRTH . CALL BACK NUMBER . REASON FOR CALL**this is important as we prioritize the call backs  [place] AS LONG AS YOU CALL BEFORE 4:00 PM  At the [facility], you and your health needs are our priority. As part of our continuing mission to provide you with exceptional heart care, we have created designated Provider Care Teams. These Care Teams include your primary Cardiologist (physician) and Advanced Practice Providers (APPs- Physician Assistants and Nurse Practitioners) who all work together to provide you with the care you need, when you need it.   You may see any of the following providers on your designated Care Team at your next follow up: Marland Kitchen Dr Glori Bickers . Dr Loralie Champagne . [name], NP . Lyda Jester, PA . Audry Riles, PharmD   Please be sure to bring in all your medications bottles to every appointment.

## 2021-01-10 DIAGNOSIS — C801 Malignant (primary) neoplasm, unspecified: Secondary | ICD-10-CM | POA: Diagnosis not present

## 2021-01-10 DIAGNOSIS — Z923 Personal history of irradiation: Secondary | ICD-10-CM | POA: Diagnosis not present

## 2021-01-10 DIAGNOSIS — C770 Secondary and unspecified malignant neoplasm of lymph nodes of head, face and neck: Secondary | ICD-10-CM | POA: Diagnosis not present

## 2021-01-17 ENCOUNTER — Other Ambulatory Visit (INDEPENDENT_AMBULATORY_CARE_PROVIDER_SITE_OTHER): Payer: Medicare HMO

## 2021-01-17 ENCOUNTER — Other Ambulatory Visit: Payer: Self-pay

## 2021-01-17 DIAGNOSIS — E038 Other specified hypothyroidism: Secondary | ICD-10-CM

## 2021-01-17 LAB — TSH: TSH: 8.55 u[IU]/mL — ABNORMAL HIGH (ref 0.35–4.50)

## 2021-01-21 MED ORDER — LEVOTHYROXINE SODIUM 50 MCG PO TABS: 50.0000 ug | ORAL_TABLET | Freq: Every day | ORAL | 1 refills | Status: DC

## 2021-01-21 NOTE — Addendum Note (Signed)
Addended byDamita Dunnings D on: 01/21/2021 09:19 AM   Modules accepted: Orders

## 2021-01-29 ENCOUNTER — Ambulatory Visit (INDEPENDENT_AMBULATORY_CARE_PROVIDER_SITE_OTHER): Payer: Medicare HMO

## 2021-01-29 DIAGNOSIS — I429 Cardiomyopathy, unspecified: Secondary | ICD-10-CM

## 2021-01-29 DIAGNOSIS — I428 Other cardiomyopathies: Secondary | ICD-10-CM | POA: Diagnosis not present

## 2021-01-29 LAB — CUP PACEART REMOTE DEVICE CHECK
Battery Remaining Longevity: 65 mo
Battery Remaining Percentage: 67 %
Battery Voltage: 2.96 V
Brady Statistic RV Percent Paced: 1 %
Date Time Interrogation Session: 20220215040018
HighPow Impedance: 61 Ohm
HighPow Impedance: 61 Ohm
Implantable Lead Implant Date: 20090618
Implantable Lead Location: 753860
Implantable Lead Model: 7121
Implantable Pulse Generator Implant Date: 20180220
Lead Channel Impedance Value: 440 Ohm
Lead Channel Pacing Threshold Amplitude: 2 V
Lead Channel Pacing Threshold Pulse Width: 0.6 ms
Lead Channel Sensing Intrinsic Amplitude: 4.6 mV
Lead Channel Setting Pacing Amplitude: 3.5 V
Lead Channel Setting Pacing Pulse Width: 0.6 ms
Lead Channel Setting Sensing Sensitivity: 0.5 mV
Pulse Gen Serial Number: 7406967

## 2021-02-04 NOTE — Progress Notes (Signed)
Remote ICD transmission.   

## 2021-02-12 ENCOUNTER — Other Ambulatory Visit: Payer: Self-pay

## 2021-02-13 ENCOUNTER — Other Ambulatory Visit: Payer: Self-pay | Admitting: Internal Medicine

## 2021-02-20 NOTE — Progress Notes (Deleted)
Chronic Care Management Pharmacy Note  02/20/2021 Name:  Matthew FUDALA Sr. MRN:  [id number] DOB:  1951/08/23  Subjective: Matthew Romans Sr. is an 70 y.o. year old male who is a primary patient of Paz, Alda Berthold, MD.  The CCM team was consulted for assistance with disease management and care coordination needs.    Engaged with patient by telephone for follow up visit in response to provider referral for pharmacy case management and/or care coordination services.   Consent to Services:  The patient was given the following information about Chronic Care Management services today, agreed to services, and gave verbal consent: 1. CCM service includes personalized support from designated clinical staff supervised by the primary care provider, including individualized plan of care and coordination with other care providers 2. 24/7 contact phone numbers for assistance for urgent and routine care needs. 3. Service will only be billed when office clinical staff spend 20 minutes or more in a month to coordinate care. 4. Only one practitioner may furnish and bill the service in a calendar month. 5.The patient may stop CCM services at any time (effective at the end of the month) by phone call to the office staff. 6. The patient will be responsible for cost sharing (co-pay) of up to 20% of the service fee (after annual deductible is met). Patient agreed to services and consent obtained.  Patient Care Team: Colon Branch, MD as PCP - General Bensimhon, Shaune Pascal, MD as Consulting Physician (Cardiology) Memory Argue, MD as Referring Physician [name], MD as Consulting Physician (Cardiology) Day, Melvenia Beam, [facility] (Inactive) as Pharmacist (Pharmacist)  Recent office visits: None since last CCM visit  Recent consult visits: 01/10/21 Malva Limes, Radiation) - patient has malignant neoplasm to lymph nodes of neck  12/25/20 (Bensimhon, cardio) - patient was trial of stopping Crestor, also monitoring amiodarone  therapy with bradycardia Hospital visits: None in previous 6 months  Social Hx: Has a sister that is a Software engineer in [place]. Lived in [place] in the 70s.  Grew up in [place].  Married 49 years in May.  2 kids. 2 grandsons (age 12 and age 58)  Fills pill box up every Sunday morning. Takes about 10 minutes.  Objective:  Lab Results  Component Value Date   CREATININE 0.99 12/25/2020   BUN 19 12/25/2020   GFR 87.03 06/09/2019   GFRNONAA >60 12/25/2020   GFRAA >60 09/13/2020   NA 136 12/25/2020   K 4.0 12/25/2020   CALCIUM 9.4 12/25/2020   CO2 28 12/25/2020    Lab Results  Component Value Date/Time   HGBA1C 6.2 06/14/2020 08:36 AM   HGBA1C 6.4 06/09/2019 08:25 AM   GFR 87.03 06/09/2019 08:25 AM   GFR 97.93 07/12/2018 01:46 PM   MICROALBUR 0.2 08/20/2010 08:50 AM   MICROALBUR 0.2 03/02/2009 09:31 AM    Last diabetic Eye exam: No results found for: HMDIABEYEEXA  Last diabetic Foot exam: No results found for: HMDIABFOOTEX   Lab Results  Component Value Date   CHOL 126 06/14/2020   HDL 59.00 06/14/2020   LDLCALC 56 06/14/2020   TRIG 53.0 06/14/2020   CHOLHDL 2 06/14/2020    Hepatic Function Latest Ref Rng & Units 09/13/2020 06/14/2020 06/09/2019  Total Protein 6.5 - 8.1 g/dL 6.7 - 6.0  Albumin 3.5 - 5.0 g/dL 3.9 - 4.1  AST 15 - 41 U/L _0 ALT 0 - 44 U/L _1 Alk Phosphatase 38 - 126 U/L 63 - 71  Total Bilirubin 0.3 - 1.2 mg/dL 2.3(H) - 2.1(H)  Bilirubin, Direct 0.0 - 0.3 mg/dL - - -    Lab Results  Component Value Date/Time   TSH 8.55 (H) 01/17/2021 07:12 AM   TSH 15.30 (H) 12/18/2020 07:13 AM   FREET4 0.91 06/14/2020 08:36 AM   FREET4 1.28 06/03/2018 10:43 AM    CBC Latest Ref Rng & Units 12/25/2020 10/11/2020 09/13/2020  WBC 4.0 - 10.5 K/uL 6.3 6.9 6.9  Hemoglobin 13.0 - 17.0 g/dL 14.9 14.8 14.2  Hematocrit 39.0 - 52.0 % 47.1 45.5 46.0  Platelets 150 - 400 K/uL 204 193 181    No results found for: VD25OH  Clinical ASCVD:  {YES/NO:21197} The ASCVD Risk score Mikey Bussing DC Jr., et al., 2013) failed to calculate for the following reasons:   The patient has a prior MI or stroke diagnosis    Depression screen [facility] 2/9 06/14/2020 06/03/2018 04/27/2017  Decreased Interest 0 0 0  Down, Depressed, Hopeless 0 0 0  PHQ - 2 Score 0 0 0  Some recent data might be hidden     ***Other: (CHADS2VASc if Afib, MMRC or CAT for COPD, ACT, DEXA)  Social History   Tobacco Use  Smoking Status Former Smoker  . Packs/day: 0.10  . Types: Cigarettes  . Quit date: 12/16/1999  . Years since quitting: 21.1  Smokeless Tobacco Never Used   BP Readings from Last 3 Encounters:  12/25/20 120/70  11/30/20 102/68  10/11/20 118/68   Pulse Readings from Last 3 Encounters:  12/25/20 (!) 49  11/30/20 (!) 52  10/11/20 (!) 53   Wt Readings from Last 3 Encounters:  12/25/20 167 lb 12.8 oz (76.1 kg)  11/30/20 162 lb 3.2 oz (73.6 kg)  10/11/20 165 lb 9.6 oz (75.1 kg)    Assessment/Interventions: Review of patient past medical history, allergies, medications, health status, including review of consultants reports, laboratory and other test data, was performed as part of comprehensive evaluation and provision of chronic care management services.   SDOH:  (Social Determinants of Health) assessments and interventions performed: {yes/no:20286}   CCM Care Plan  Allergies  Allergen Reactions  . Atorvastatin      myalgia, Muscle aching  . Sulfonamide Derivatives     Rash and hot flashes  . Tape Other (See Comments)    Rips skin- use PAPER tape  . Sulfamethoxazole Rash and Other (See Comments)    Hot flashes    Medications Reviewed Today    Reviewed by Stanford Scotland, RN (Registered Nurse) on 12/25/20 at [place] List Status: <None>  Medication Order Taking? Sig Documenting Provider Last Dose Status Informant  acetaminophen (TYLENOL) 325 MG tablet [id number] Yes Take 650 mg by mouth every 6 (six) hours as needed (for pain.). [provider] Taking Active Self           Med Note Isac Caddy, Octavia Heir   Wed Oct 12, 2019  2:03 PM)    amiodarone (PACERONE) 200 MG tablet [id number] Yes TAKE 1 TABLET BY MOUTH EVERY DAY Bensimhon, Shaune Pascal, MD Taking Active   apixaban (ELIQUIS) 5 MG TABS tablet [id number] Yes Take 1 tablet (5 mg total) by mouth 2 (two) times daily. Chanetta Marshall K, NP Taking Active Self  candesartan (ATACAND) 4 MG tablet [id number] Yes Take 4 mg by mouth 2 (two) times daily. [provider] Taking Active   carboxymethylcellul-glycerin (OPTIVE) 0.5-0.9 % ophthalmic solution [id number] Yes Place 1 drop into both eyes daily. [provider] Taking  Active Self  cetirizine (ZYRTEC) 10 MG tablet [id number] Yes Take 10 mg by mouth as needed for allergies. [provider] Taking Active Self  Colchicine (MITIGARE) 0.6 MG CAPS [id number] Yes Take 0.6 mg by mouth 2 (two) times daily as needed (gout flare). Colon Branch, MD Taking Active Self           Med Note Jeanie Cooks Sep 12, 2020  3:38 PM)    FARXIGA 10 MG TABS tablet [id number] Yes TAKE 1 TABLET BY MOUTH BEFORE BREAKFAST Bensimhon, Shaune Pascal, MD Taking Active Self  furosemide (LASIX) 40 MG tablet [id number] Yes TAKE 1 TABLET EVERY DAY Bensimhon, Shaune Pascal, MD Taking Active Self  levothyroxine (SYNTHROID) 25 MCG tablet [id number] Yes Take 1 tablet (25 mcg total) by mouth daily before breakfast. Colon Branch, MD Taking Active   nitroGLYCERIN (NITROSTAT) 0.4 MG SL tablet [id number] Yes Place 1 tablet (0.4 mg total) under the tongue every 5 (five) minutes x 3 doses as needed for chest pain. Colon Branch, MD Taking Active Self  Omega-3 Fatty Acids (FISH OIL) 1200 MG CAPS [id number] Yes Take 3,600 mg by mouth daily. [provider] Taking Active Self  rosuvastatin (CRESTOR) 40 MG tablet [id number] Yes Take 1 tablet (40 mg total) by mouth at bedtime. Colon Branch, MD Taking Active Self  spironolactone (ALDACTONE) 25 MG tablet [id number] Yes TAKE  1/2 TABLET BY MOUTH EVERY DAY Bensimhon, Shaune Pascal, MD Taking Active           Patient Active Problem List   Diagnosis Date Noted  . Heme + stool   . Internal and external prolapsed hemorrhoids   . Difficulty swallowing 01/21/2018  . 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 ([place]) 12/24/2017  . 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 ([place]) 11/12/2017  . PCP NOTES >>>>>>>>>>>>>>>>>>>>> 04/21/2016  . Seborrheic dermatitis of scalp 04/11/2013  . Annual physical exam 04/11/2011  . MITRAL REGURGITATION 04/10/2010  . SYSTOLIC HEART FAILURE, CHRONIC 10/03/2009  . Hyperglycemia 06/19/2008  . Coronary atherosclerosis 06/14/2008  . Automatic implantable cardioverter-defibrillator in situ 06/01/2008  . ERECTILE DYSFUNCTION 12/17/2007  . Hyperlipidemia 02/06/2007  . Gout 02/06/2007  . RBBB 02/06/2007  . Congestive heart failure ([place]) 02/06/2007  . CORONARY ARTERY BYPASS GRAFT, FOUR VESSEL, HX OF 02/12/2005    Immunization History  Administered Date(s) Administered  . Influenza Split 10/10/2011, 10/11/2012  . Influenza Whole 11/18/2007, 09/27/2008, 09/27/2009, 08/20/2010  . Influenza, High Dose Seasonal PF 11/25/2016, 10/22/2017, 10/21/2018, 10/19/2019  . Influenza,inj,Quad PF,6+ Mos 10/13/2013, 10/24/2014, 11/20/2015  . PFIZER(Purple Top)SARS-COV-2 Vaccination 02/03/2020, 02/22/2020  . Pneumococcal Conjugate-13 04/13/2014  . Pneumococcal Polysaccharide-23 03/07/2009, 11/20/2015  . Td 12/15/2006, 04/27/2017  . Zoster 04/11/2013  . Zoster Recombinat (Shingrix) 04/30/2018, 07/02/2018    Conditions to be addressed/monitored:  Pre-DM, HF, HLD, CAD, Gout, Pain, Dry Eye, Allergies  There are no care plans that you recently modified to display for this patient.    Medication Assistance: {MEDASSISTANCEINFO:25044}  Patient's preferred pharmacy is:  [facility] [place], [place] [address] [place] [address] Phone: [phone] Fax: [phone]  Uses pill box? {Yes or If no, why not?:20788} Pt endorses ***% compliance  We discussed: {Pharmacy options:24294} Patient decided to: {US Pharmacy Plan:23885}  Care Plan and Follow Up Patient Decision:  {FOLLOWUP:24991}  Plan: {CM FOLLOW UP PLAN:25073}  *** Current Barriers:  . {pharmacybarriers:24917} . ***  Pharmacist Clinical Goal(s):  Marland Kitchen Over the next *** days, patient will {PHARMACYGOALCHOICES:24921} through collaboration with PharmD and provider.  . ***  Interventions: . 1:1 collaboration with Colon Branch, MD regarding development and update of comprehensive plan of care as evidenced by provider attestation and co-signature . Inter-disciplinary care team collaboration (see longitudinal plan of care) . Comprehensive medication review performed; medication list updated in electronic medical record  Hypertension (BP goal {CHL HP UPSTREAM Pharmacist BP ranges:[phone]}) -{US controlled/uncontrolled:25276} -Current treatment:  Entresto 24-70m  Spironolactone 291mdaily  Metoprolol succinate 2548m/2 tab BID -Medications previously tried: ***  -Current home readings: *** -Current dietary habits: *** -Current exercise habits: *** -{ACTIONS;DENIES/REPORTS:21021675::"Denies"} hypotensive/hypertensive symptoms -Educated on {CCM BP Counseling:25124} -Counseled to monitor BP at home ***, document, and provide log at future appointments -{CCMPHARMDINTERVENTION:25122}  Hyperlipidemia: (LDL goal < ***) -{US controlled/uncontrolled:25276} -Current treatment:  Rosuvastatin 18m50mily -Medications previously tried: ***  -Current dietary patterns: *** -Current exercise habits: *** -Educated on {CCM HLD Counseling:25126} -{CCMPHARMDINTERVENTION:25122}  Pre-Diabetes (A1c goal {A1c goals:23924}) -{US controlled/uncontrolled:25276} -Current medications:  Farxiga 10mg35mly (primarily for HF  diagnosis) -Medications previously tried: ***  -Current home glucose readings . fasting glucose: *** . post prandial glucose: *** -{ACTIONS;DENIES/REPORTS:21021675::"Denies"} hypoglycemic/hyperglycemic symptoms -Current meal patterns:  . breakfast: ***  . lunch: ***  . dinner: *** . snacks: *** . drinks: *** -Current exercise: *** -Educated on {CCM DM COUNSELING:25123} -Counseled to check feet daily and get yearly eye exams -{CCMPHARMDINTERVENTION:25122}  Heart Failure (Goal: manage symptoms and prevent exacerbations) -{US controlled/uncontrolled:25276} -Last ejection fraction: 05/29/20 20% mod to severe RV dysfunction -HF type: Systolic -NYHA Class: {CHL HP Upstream Pharm NYHA Class:[phone]} -AHA HF Stage: {CHL HP Upstream Pharm AHA HF Stage:[phone]} -Current treatment:  Metoprolol succinate 12.5mg B87m Entresto 24-26mg  75miga 10mg da65m Furosemide 18mg dai46mMedications previously tried: ***  -Current home BP/HR readings: *** -Current dietary habits: *** -Current exercise habits: *** -Educated on {CCM HF Counseling:25125} -{CCMPHARMDINTERVENTION:25122}   Patient Goals/Self-Care Activities . Over the next *** days, patient will:  - {pharmacypatientgoals:24919}  Follow Up Plan: {CM FOLLOW UP PLAN:2224QFJU:12224}

## 2021-02-25 ENCOUNTER — Telehealth: Payer: Medicare HMO

## 2021-03-14 ENCOUNTER — Other Ambulatory Visit (INDEPENDENT_AMBULATORY_CARE_PROVIDER_SITE_OTHER): Payer: Medicare HMO

## 2021-03-14 ENCOUNTER — Ambulatory Visit (INDEPENDENT_AMBULATORY_CARE_PROVIDER_SITE_OTHER): Payer: Medicare HMO | Admitting: Pharmacist

## 2021-03-14 ENCOUNTER — Other Ambulatory Visit: Payer: Self-pay

## 2021-03-14 ENCOUNTER — Telehealth: Payer: Self-pay | Admitting: Pharmacist

## 2021-03-14 DIAGNOSIS — E038 Other specified hypothyroidism: Secondary | ICD-10-CM

## 2021-03-14 DIAGNOSIS — I25810 Atherosclerosis of coronary artery bypass graft(s) without angina pectoris: Secondary | ICD-10-CM

## 2021-03-14 DIAGNOSIS — I5022 Chronic systolic (congestive) heart failure: Secondary | ICD-10-CM | POA: Diagnosis not present

## 2021-03-14 DIAGNOSIS — E785 Hyperlipidemia, unspecified: Secondary | ICD-10-CM

## 2021-03-14 DIAGNOSIS — R739 Hyperglycemia, unspecified: Secondary | ICD-10-CM

## 2021-03-14 LAB — TSH: TSH: 5.87 u[IU]/mL — ABNORMAL HIGH (ref 0.35–4.50)

## 2021-03-14 MED ORDER — LEVOTHYROXINE SODIUM 88 MCG PO TABS: 88.0000 ug | ORAL_TABLET | Freq: Every day | ORAL | 2 refills | Status: DC

## 2021-03-14 NOTE — Chronic Care Management (AMB) (Signed)
Chronic Care Management Pharmacy Note  03/14/2021 Name:  Matthew THOMLEY Sr. MRN:  [id number] DOB:  08-Feb-1951  Subjective: Matthew Romans Sr. is an 70 y.o. year old male who is a primary patient of Paz, Alda Berthold, MD.  The CCM team was consulted for assistance with disease management and care coordination needs.    Engaged with patient by telephone for follow up visit in response to provider referral for pharmacy case management and/or care coordination services.   Consent to Services:  The patient was given information about Chronic Care Management services, agreed to services, and gave verbal consent prior to initiation of services.  Please see initial visit note for detailed documentation.   Patient Care Team: Colon Branch, MD as PCP - General Bensimhon, Shaune Pascal, MD as Consulting Physician (Cardiology) Memory Argue, MD as Referring Physician [name], MD as Consulting Physician (Cardiology) Cherre Robins, PharmD (Pharmacist)  Recent office visits: 06/14/2020 - PCP (Dr Larose Kells) F/U chronic conditions/ CPE: labs checked. TSH noted to be slightly elevated but no meds added. 12/18/2020 - Lab TSH rechecked and was higher. Levothyroxine 75mg started.  01/17/2021 - Lab - TSH still elevated at 8.55. Levothyroxine increased to 50 mcg daily   Recent consult visits: 01/10/2021 - Rad Onc (Do Pacholke) 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. Follow-up in 6 months with flexible nasolaryngoscopy or sooner should symptoms warrant. He will continue fluoride trays nightly and see his dentist biannually. His thyroid studies and thyroid supplementation are obtained and managed by his PCP.  12/25/2020 - Cardio (Dr BHaroldine Laws. F/U CHF. Started Entresto 24/266mtwice daily. Stopped candesartan 2m22m (patient later stopped Entresto and restarted candesartan due to intolerance to EntMedical [facility]its: None in previous 6 months  Objective:  Lab  Results  Component Value Date   CREATININE 0.99 12/25/2020   CREATININE 0.94 10/11/2020   CREATININE 1.00 09/13/2020    Lab Results  Component Value Date   HGBA1C 6.2 06/14/2020   Last diabetic Eye exam: No results found for: HMDIABEYEEXA  Last diabetic Foot exam: No results found for: HMDIABFOOTEX      Component Value Date/Time   CHOL 126 06/14/2020 0836   TRIG 53.0 06/14/2020 0836   TRIG 45 12/16/2006 0912   HDL 59.00 06/14/2020 0836   CHOLHDL 2 06/14/2020 0836   VLDL 10.6 06/14/2020 0836   LDLCALC 56 06/14/2020 0836    Hepatic Function Latest Ref Rng & Units 09/13/2020 06/14/2020 06/09/2019  Total Protein 6.5 - 8.1 g/dL 6.7 - 6.0  Albumin 3.5 - 5.0 g/dL 3.9 - 4.1  AST 15 - 41 U/L '25 21 20  ' ALT 0 - 44 U/L '29 18 21  ' Alk Phosphatase 38 - 126 U/L 63 - 71  Total Bilirubin 0.3 - 1.2 mg/dL 2.3(H) - 2.1(H)  Bilirubin, Direct 0.0 - 0.3 mg/dL - - -    Lab Results  Component Value Date/Time   TSH 5.87 (H) 03/14/2021 07:09 AM   TSH 8.55 (H) 01/17/2021 07:12 AM   FREET4 0.91 06/14/2020 08:36 AM   FREET4 1.28 06/03/2018 10:43 AM    CBC Latest Ref Rng & Units 12/25/2020 10/11/2020 09/13/2020  WBC 4.0 - 10.5 K/uL 6.3 6.9 6.9  Hemoglobin 13.0 - 17.0 g/dL 14.9 14.8 14.2  Hematocrit 39.0 - 52.0 % 47.1 45.5 46.0  Platelets 150 - 400 K/uL 204 193 181    No results found for: VD25OH  Clinical ASCVD: Yes  The ASCVD  Risk score Mikey Bussing DC Jr., et al., 2013) failed to calculate for the following reasons:   The patient has a prior MI or stroke diagnosis    Other: (CHADS2VASc if Afib, PHQ9 if depression, MMRC or CAT for COPD, ACT, DEXA)  Social History   Tobacco Use  Smoking Status Former Smoker  . Packs/day: 0.10  . Types: Cigarettes  . Quit date: 12/16/1999  . Years since quitting: 21.2  Smokeless Tobacco Never Used   BP Readings from Last 3 Encounters:  12/25/20 120/70  11/30/20 102/68  10/11/20 118/68   Pulse Readings from Last 3 Encounters:  12/25/20 (!) 49  11/30/20 (!)  52  10/11/20 (!) 53   Wt Readings from Last 3 Encounters:  12/25/20 167 lb 12.8 oz (76.1 kg)  11/30/20 162 lb 3.2 oz (73.6 kg)  10/11/20 165 lb 9.6 oz (75.1 kg)    Assessment: Review of patient past medical history, allergies, medications, health status, including review of consultants reports, laboratory and other test data, was performed as part of comprehensive evaluation and provision of chronic care management services.   SDOH:  (Social Determinants of Health) assessments and interventions performed:    CCM Care Plan  Allergies  Allergen Reactions  . Atorvastatin      myalgia, Muscle aching  . Entresto [Sacubitril-Valsartan] Itching    Had itching and redness  . Sulfonamide Derivatives     Rash and hot flashes  . Tape Other (See Comments)    Rips skin- use PAPER tape  . Sulfamethoxazole Rash and Other (See Comments)    Hot flashes    Medications Reviewed Today    Reviewed by Cherre Robins, PharmD (Pharmacist) on 03/14/21 at [place] List Status: <None>  Medication Order Taking? Sig Documenting Provider Last Dose Status Informant  acetaminophen (TYLENOL) 325 MG tablet [id number] Yes Take 650 mg by mouth every 6 (six) hours as needed (for pain.). [provider] Taking Active Self           Med Note Isac Caddy, Octavia Heir   Wed Oct 12, 2019  2:03 PM)    amiodarone (PACERONE) 200 MG tablet [id number] Yes TAKE 1 TABLET BY MOUTH EVERY DAY Bensimhon, Shaune Pascal, MD Taking Active   apixaban (ELIQUIS) 5 MG TABS tablet [id number] Yes Take 1 tablet (5 mg total) by mouth 2 (two) times daily. Patsey Berthold, NP Taking Active Self  candesartan (ATACAND) 8 MG tablet [id number] Yes Take 4 mg by mouth in the morning and at bedtime. [provider] Taking Active   carboxymethylcellul-glycerin (OPTIVE) 0.5-0.9 % ophthalmic solution [id number] Yes Place 1 drop into both eyes daily. [provider] Taking Active Self  cetirizine (ZYRTEC) 10 MG tablet [id number] Yes Take 10  mg by mouth as needed for allergies. [provider] Taking Active Self  Colchicine (MITIGARE) 0.6 MG CAPS [id number] Yes Take 0.6 mg by mouth 2 (two) times daily as needed (gout flare). Colon Branch, MD Taking Active Self           Med Note Jeanie Cooks Sep 12, 2020  3:38 PM)    FARXIGA 10 MG TABS tablet [id number] Yes TAKE 1 TABLET BY MOUTH BEFORE BREAKFAST Bensimhon, Shaune Pascal, MD Taking Active Self  furosemide (LASIX) 40 MG tablet [id number] Yes TAKE 1 TABLET EVERY DAY Bensimhon, Shaune Pascal, MD Taking Active Self  levothyroxine (SYNTHROID) 88 MCG tablet [id number] Yes Take 1 tablet (88 mcg total) by mouth daily. Colon Branch, MD Taking Active  nitroGLYCERIN (NITROSTAT) 0.4 MG SL tablet [id number] Yes Place 1 tablet (0.4 mg total) under the tongue every 5 (five) minutes x 3 doses as needed for chest pain. Colon Branch, MD Taking Active Self  Omega-3 Fatty Acids (FISH OIL) 1200 MG CAPS [id number] Yes Take 3,600 mg by mouth daily. [provider] Taking Active Self  rosuvastatin (CRESTOR) 40 MG tablet [id number] Yes Take 1 tablet (40 mg total) by mouth at bedtime. Colon Branch, MD Taking Active Self  spironolactone (ALDACTONE) 25 MG tablet [id number] Yes TAKE 1/2 TABLET BY MOUTH EVERY DAY Bensimhon, Shaune Pascal, MD Taking Active           Patient Active Problem List   Diagnosis Date Noted  . Heme + stool   . Internal and external prolapsed hemorrhoids   . Difficulty swallowing 01/21/2018  . 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 ([place]) 12/24/2017  . 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 ([place]) 11/12/2017  . PCP NOTES >>>>>>>>>>>>>>>>>>>>> 04/21/2016  . Seborrheic dermatitis of scalp 04/11/2013  . Annual physical exam 04/11/2011  . MITRAL REGURGITATION 04/10/2010  . SYSTOLIC HEART FAILURE, CHRONIC 10/03/2009  . Hyperglycemia 06/19/2008  . Coronary atherosclerosis 06/14/2008  . Automatic  implantable cardioverter-defibrillator in situ 06/01/2008  . ERECTILE DYSFUNCTION 12/17/2007  . Hyperlipidemia 02/06/2007  . Gout 02/06/2007  . RBBB 02/06/2007  . Congestive heart failure ([place]) 02/06/2007  . CORONARY ARTERY BYPASS GRAFT, FOUR VESSEL, HX OF 02/12/2005    Immunization History  Administered Date(s) Administered  . Fluad Quad(high Dose 65+) 10/25/2020  . Influenza Split 10/10/2011, 10/11/2012  . Influenza Whole 11/18/2007, 09/27/2008, 09/27/2009, 08/20/2010  . Influenza, High Dose Seasonal PF 11/25/2016, 10/22/2017, 10/21/2018, 10/19/2019  . Influenza,inj,Quad PF,6+ Mos 10/13/2013, 10/24/2014, 11/20/2015  . PFIZER(Purple Top)SARS-COV-2 Vaccination 02/03/2020, 02/22/2020, 09/18/2020  . Pneumococcal Conjugate-13 04/13/2014  . Pneumococcal Polysaccharide-23 03/07/2009, 11/20/2015  . Td 12/15/2006, 04/27/2017  . Zoster 04/11/2013  . Zoster Recombinat (Shingrix) 04/30/2018, 07/02/2018    Conditions to be addressed/monitored: CHF, CAD, HTN, HLD and Thyroid disease, gout, metastatic throat cancer; pre diabetes  Care Plan : General Pharmacy (Adult)  Updates made by Cherre Robins, PHARMD since 03/14/2021 12:00 AM    Problem: Medication Adherence (Wellness)     Goal: Medication Adherence Maintained   Priority: High  Note:   Current Barriers:  . Suboptimal therapeutic regimen for thyroid disease . Usually enters coverage gap in June or July  Pharmacist Clinical Goal(s):  Marland Kitchen Over the next 180 days, patient will adhere to plan to optimize therapeutic regimen for thyroid disease, CHF, HTN and lipids as evidenced by report of adherence to recommended medication management changes . Patient will notify clinical pharmacist when he enters / is close to coverage gap to begin application process for Eliquis and [place]  through collaboration with PharmD and provider.   Interventions: . 1:1 collaboration with Colon Branch, MD regarding development and update of comprehensive plan of  care as evidenced by provider attestation and co-signature . Inter-disciplinary care team collaboration (see longitudinal plan of care) . Comprehensive medication review performed; medication list updated in electronic medical record  Pre - Diabetes: . Controlled; current treatment: diet and exercise (he is taking Farxiga 57m daily but this is mostly for CHF) . Current glucose readings: does not monitor BG at home . Current exercise: frequent golfing and some walking . Recommended continue current diet and exercise  Hypertension / CHF: . Controlled; current treatment: candasartan 854m- take 0.5  tablet = 37m twice a day; furosemide 469mdaily; spironolactone 2534m take 0.5 tablet daily; Farxiga 76m53mily  . Current home readings: 120's  / 70's (patient was concerned that thyroid mediation was increasing his BP because BP use to be 110's/60's. . Unable to tolerate Entresto - caused itching and redness . Counseled on BP goals and assuredd him that recent BP reading have been good / at goal; Also reviewed importance of weighing daily and when to notify provider of weight increases.  Recommended continue current therapy  Hyperlipidemia: . controlled; current treatment:rosuvastatin 40mg11mly  . Medications previously tried: atorvastatin  . Recommended continue current therapy   Atrial Fibrillation: . Uncontrolled/controlled; current rate/rhythm control - amiodarone 200mg 39my ; anticoagulant treatment: Eliquis 5mg tw80m daily . CHADS2VASc score: 4 . Recommended Continue current therapy Assessed for financial issues with Eliquis. Patient states only issue is when he reaches coverage gap. Will plan to follow closely and apply for assistance when he approaches coverage gap (Eliquis PAP requires OOP spend of 3% of income before will qualify for program)  Patient Goals/Self-Care Activities . Over the next 180 days, patient will:  take medications as prescribed and collaborate with provider on  medication access solutions  Follow Up Plan: Telephone follow up appointment with care management team member scheduled for:  June 2022       Task: Optimize Medication Use   Note:   Care Management Activities:    - counseling by pharmacist provided - medication list reviewed - medication-adherence assessment completed    Notes:     Medication Assistance: Discussed with patient. He states currently meds are affordable but not when he reaches coverage gap. Will continue to follow and assist patient in applying for PAP later in year (Farxiga and Eliquis).   [facility]'s [facility] [address] [place] [phone] Up:  Patient agrees to Care Plan and Follow-up.  Plan: Telephone follow up appointment with care management team member scheduled for:  June 2022 to assess for patient assistance need while in coverage gap.  Elliett Guarisco ECherre RobinsD Clinical Pharmacist [facility]

## 2021-03-14 NOTE — Patient Instructions (Signed)
Visit Information  PATIENT GOALS: Goals Addressed            This Visit's Progress   . A1c goal <6.5%   On track    -Pre-Diabetes a1c range is 5.7% to 6.4%. Your most recent a1c was 6.4% on 06/09/19.  -Usually the full diabetes diagnosis is given when a1c reaches 6.5% or higher.     . Chronic Care Management Pharmacy Care Plan   On track    CARE PLAN ENTRY (see longitudinal plan of care for additional care plan information)  Current Barriers:  . Chronic Disease Management support, education, and care coordination needs related to Pre-DM, HF, HTN, HLD, CAD, Gout, Pain, Dry Eye, Allergies   Hypertension Screening BP Readings from Last 3 Encounters:  12/25/20 120/70  11/30/20 102/68  10/11/20 118/68   . Pharmacist Clinical Goal(s): o Over the next 180 days, patient will work with PharmD and providers to maintain BP goal <130/80 . Current regimen:   Candesartan 8mg  - take 0.5 tablet = 4mg  twice a day   Spironolactone 25mg  - take 0.5 tablet daily . Patient self care activities - Over the next 180 days, patient will: o Maintain hypertension medication regimen. o Continue to check blood pressure and HR every other day  Hyperlipidemia Lab Results  Component Value Date/Time   LDLCALC 56 06/14/2020 08:36 AM   . Pharmacist Clinical Goal(s): o Over the next 180 days, patient will work with PharmD and providers to maintain LDL goal < 70 . Current regimen:  o Rosuvastatin 40mg  daily . Patient self care activities - Over the next 180 days, patient will: o Maintain cholesterol medication regimen.   Pre-Diabetes Lab Results  Component Value Date/Time   HGBA1C 6.2 06/14/2020 08:36 AM   HGBA1C 6.4 06/09/2019 08:25 AM   . Pharmacist Clinical Goal(s): o Over the next 180 days, patient will work with PharmD and providers to maintain A1c goal <6.5% . Current regimen:  o [place] 10mg  daily (primarily for heart failure diagnosis) . Interventions: o Discussed diet and  exercise . Patient self care activities - Over the next 180 days, patient will: o Maintain a1c <6.5%  Heart Failure . Pharmacist Clinical Goal(s) o Over the next 180 days, patient will work with PharmD and providers to reduce symptoms of heart failure . Current regimen:   Candesartan 8mg  - take 0.5 tablet = 4mg  twice a day   Farxiga 10mg  daily  Furosemide 40mg  daily  Spironolactone 25mg  - take 0.5 tablet daily  . Interventions: o Reviewed importance of daily weights and when to report weight gain  - More than 3lbs in 1 day  - More than 5lbs in 1 week  - Contact cardio, primary care, or pharmacy team . Patient self care activities - Over the next 180 days, patient will: o Weigh daily o Maintain heart failure medication regimen  Medication management . Pharmacist Clinical Goal(s): o Over the next 180 days, patient will work with PharmD and providers to maintain optimal medication adherence . [facility] . Interventions o Comprehensive medication review performed. o Continue current medication management strategy o Discussed options for assistance when he reaches Medicare coverage gap.  . Patient self care activities - Over the next 180 days, patient will: o Focus on medication adherence by filling and taking medications appropriately o Take medications as prescribed o Report any questions or concerns to PharmD and/or provider(s) o Reviewed you [employer] estimation of benefits each month and notify clinical pharmacist at Dr Ethel Rana office when  you are approaching coverage gap. We can submit patient assistance for Farxiga and Eliquis.   Please see past updates related to this goal by clicking on the "Past Updates" button in the selected goal     . LDL goal <70   On track   . Remember take candesartan 8mg  as 1/2 tablet twice daily   On track      Patient verbalizes understanding of instructions provided today and agrees to view in [place].   Telephone follow up  appointment with care management team member scheduled for: June 2022  Cherre Robins, PharmD Clinical Pharmacist [place] [phone]

## 2021-03-14 NOTE — Progress Notes (Addendum)
    Chronic Care Management Pharmacy Assistant   Name: Matthew SOMMERVILLE Sr.  MRN: [id number] DOB: 09-12-1951  Reason for Encounter: Adherence Review  I reviewed the patients chart for any medical/health changes and/or medication changes, the patient STOPPED Candesartan 4 mg from visit with the Pennelope Bracken, MD at the [facility] on 01/10/21.  Follow-Up:Pharmacist Review  Charlann Lange, [place] Pharmacist Assistant [phone]

## 2021-03-31 ENCOUNTER — Other Ambulatory Visit (HOSPITAL_COMMUNITY): Payer: Self-pay | Admitting: Internal Medicine

## 2021-04-24 ENCOUNTER — Other Ambulatory Visit (INDEPENDENT_AMBULATORY_CARE_PROVIDER_SITE_OTHER): Payer: Medicare HMO

## 2021-04-24 ENCOUNTER — Other Ambulatory Visit: Payer: Self-pay

## 2021-04-24 DIAGNOSIS — E038 Other specified hypothyroidism: Secondary | ICD-10-CM | POA: Diagnosis not present

## 2021-04-24 LAB — TSH: TSH: 2.08 u[IU]/mL (ref 0.35–4.50)

## 2021-04-25 ENCOUNTER — Ambulatory Visit (HOSPITAL_COMMUNITY)
Admission: RE | Admit: 2021-04-25 | Discharge: 2021-04-25 | Disposition: A | Discharge status: Home / Self Care | Payer: Medicare HMO | Source: Ambulatory Visit | Attending: Internal Medicine | Admitting: Internal Medicine

## 2021-04-25 ENCOUNTER — Encounter (HOSPITAL_COMMUNITY): Payer: Self-pay | Admitting: Internal Medicine

## 2021-04-25 VITALS — BP 102/60 | HR 49 | Wt 165.2 lb

## 2021-04-25 DIAGNOSIS — I081 Rheumatic disorders of both mitral and tricuspid valves: Secondary | ICD-10-CM | POA: Diagnosis not present

## 2021-04-25 DIAGNOSIS — E119 Type 2 diabetes mellitus without complications: Secondary | ICD-10-CM | POA: Insufficient documentation

## 2021-04-25 DIAGNOSIS — I498 Other specified cardiac arrhythmias: Secondary | ICD-10-CM | POA: Diagnosis not present

## 2021-04-25 DIAGNOSIS — Z85819 Personal history of malignant neoplasm of unspecified site of lip, oral cavity, and pharynx: Secondary | ICD-10-CM | POA: Diagnosis not present

## 2021-04-25 DIAGNOSIS — Z95810 Presence of automatic (implantable) cardiac defibrillator: Secondary | ICD-10-CM | POA: Diagnosis not present

## 2021-04-25 DIAGNOSIS — I480 Paroxysmal atrial fibrillation: Secondary | ICD-10-CM | POA: Diagnosis not present

## 2021-04-25 DIAGNOSIS — Z7901 Long term (current) use of anticoagulants: Secondary | ICD-10-CM | POA: Insufficient documentation

## 2021-04-25 DIAGNOSIS — I2510 Atherosclerotic heart disease of native coronary artery without angina pectoris: Secondary | ICD-10-CM

## 2021-04-25 DIAGNOSIS — F17210 Nicotine dependence, cigarettes, uncomplicated: Secondary | ICD-10-CM | POA: Diagnosis not present

## 2021-04-25 DIAGNOSIS — Z79890 Hormone replacement therapy (postmenopausal): Secondary | ICD-10-CM | POA: Diagnosis not present

## 2021-04-25 DIAGNOSIS — Z923 Personal history of irradiation: Secondary | ICD-10-CM | POA: Diagnosis not present

## 2021-04-25 DIAGNOSIS — Z8249 Family history of ischemic heart disease and other diseases of the circulatory system: Secondary | ICD-10-CM | POA: Diagnosis not present

## 2021-04-25 DIAGNOSIS — I4510 Unspecified right bundle-branch block: Secondary | ICD-10-CM | POA: Diagnosis not present

## 2021-04-25 DIAGNOSIS — Z9221 Personal history of antineoplastic chemotherapy: Secondary | ICD-10-CM | POA: Insufficient documentation

## 2021-04-25 DIAGNOSIS — E785 Hyperlipidemia, unspecified: Secondary | ICD-10-CM | POA: Diagnosis not present

## 2021-04-25 DIAGNOSIS — I5022 Chronic systolic (congestive) heart failure: Secondary | ICD-10-CM | POA: Diagnosis not present

## 2021-04-25 DIAGNOSIS — Z79899 Other long term (current) drug therapy: Secondary | ICD-10-CM | POA: Diagnosis not present

## 2021-04-25 DIAGNOSIS — Z951 Presence of aortocoronary bypass graft: Secondary | ICD-10-CM | POA: Diagnosis not present

## 2021-04-25 DIAGNOSIS — Z7984 Long term (current) use of oral hypoglycemic drugs: Secondary | ICD-10-CM | POA: Diagnosis not present

## 2021-04-25 DIAGNOSIS — I340 Nonrheumatic mitral (valve) insufficiency: Secondary | ICD-10-CM

## 2021-04-25 HISTORY — DX: Disorder of thyroid, unspecified: E07.9

## 2021-04-25 LAB — BRAIN NATRIURETIC PEPTIDE: B Natriuretic Peptide: 190.3 pg/mL — ABNORMAL HIGH (ref 0.0–100.0)

## 2021-04-25 LAB — LIPID PANEL
Cholesterol: 147 mg/dL (ref 0–200)
HDL: 77 mg/dL (ref >40)
LDL Cholesterol: 59 mg/dL (ref 0–99)
Total CHOL/HDL Ratio: 1.9 RATIO
Triglycerides: 55 mg/dL (ref <150)
VLDL: 11 mg/dL (ref 0–40)

## 2021-04-25 LAB — COMPREHENSIVE METABOLIC PANEL
ALT: 27 U/L (ref 0–44)
AST: 34 U/L (ref 15–41)
Albumin: 3.9 g/dL (ref 3.5–5.0)
Alkaline Phosphatase: 55 U/L (ref 38–126)
Anion gap: 8 (ref 5–15)
BUN: 15 mg/dL (ref 8–23)
CO2: 30 mmol/L (ref 22–32)
Calcium: 9.6 mg/dL (ref 8.9–10.3)
Chloride: 98 mmol/L (ref 98–111)
Creatinine, Ser: 1.1 mg/dL (ref 0.61–1.24)
GFR, Estimated: >60 mL/min (ref >60)
Glucose, Bld: 109 mg/dL — ABNORMAL HIGH (ref 70–99)
Potassium: 3.8 mmol/L (ref 3.5–5.1)
Sodium: 136 mmol/L (ref 135–145)
Total Bilirubin: 1.9 mg/dL — ABNORMAL HIGH (ref 0.3–1.2)
Total Protein: 6.5 g/dL (ref 6.5–8.1)

## 2021-04-25 LAB — CBC
HCT: 45.8 % (ref 39.0–52.0)
Hemoglobin: 14.7 g/dL (ref 13.0–17.0)
MCH: 30.3 pg (ref 26.0–34.0)
MCHC: 32.1 g/dL (ref 30.0–36.0)
MCV: 94.4 fL (ref 80.0–100.0)
Platelets: 201 10*3/uL (ref 150–400)
RBC: 4.85 MIL/uL (ref 4.22–5.81)
RDW: 12.8 % (ref 11.5–15.5)
WBC: 5.9 10*3/uL (ref 4.0–10.5)
nRBC: 0 % (ref 0.0–0.2)

## 2021-04-25 NOTE — Patient Instructions (Signed)
No changes in medication today  Labs done today, your results will be available in MyChart, we will contact you for abnormal readings.  Your physician has requested that you have an echocardiogram. Echocardiography is a painless test that uses sound waves to create images of your heart. It provides your doctor with information about the size and shape of your heart and how well your heart's chambers and valves are working. This procedure takes approximately one hour. There are no restrictions for this procedure.  Your physician recommends that you schedule a follow-up appointment in: 6 months Please call our office to schedule an appointment in October.  If you have any questions or concerns before your next appointment please send [place] a message through [place] or call our office at [phone].    TO LEAVE A MESSAGE FOR THE NURSE SELECT OPTION 2, PLEASE LEAVE A MESSAGE INCLUDING: . YOUR NAME . DATE OF BIRTH . CALL BACK NUMBER . REASON FOR CALL**this is important as we prioritize the call backs  [place] AS LONG AS YOU CALL BEFORE 4:00 PM  At the [facility], you and your health needs are our priority. As part of our continuing mission to provide you with exceptional heart care, we have created designated Provider Care Teams. These Care Teams include your primary Cardiologist (physician) and Advanced Practice Providers (APPs- Physician Assistants and Nurse Practitioners) who all work together to provide you with the care you need, when you need it.   You may see any of the following providers on your designated Care Team at your next follow up: Marland Kitchen Dr Glori Bickers . Dr Loralie Champagne . Dr Vickki Muff . [name], NP . Lyda Jester, [place] . Audry Riles, PharmD   Please be sure to bring in all your medications bottles to every appointment.

## 2021-04-25 NOTE — Progress Notes (Addendum)
Advanced [facility] Failure Clinic Note  Date:  04/25/2021   ID:  Matthew Romans Sr., DOB 01/04/1951, MRN [id number]  Location: Home  Provider location: [facility] Type of Visit: Established patient  PCP:  Colon Branch, MD  Cardiologist:  None Primary HF: Matthew Joyce  Chief Complaint: Heart Failure follow-up   History of Present Illness:  Matthew Joyce is a71 y.o.malewith a history of a CAD S/P CABG in 2006 (LIMA -> LAD, SVG -> Ramus, SVG -> OM-2, SVG -> RCA), s/p ICD, ischemic MR, DM, hyperlipidemia, and throat cancer s/p chemo/XRT in 2018  Stress test 08/14/2016 with EF 31%, old infarct, no ischemia.  Developed CP and SOB in early October. Cath on 09/20/19. Coronary anatomy was stable but EF down to 20% on cath with 3+ MR Echo on 09/20/19 read as EF 25-30% with mod MR. (I reviewed echo and EF 20-25% and moderate to severe MR). Since that time has had CPX with only mild HF limitation.  TEE EF 20-25%. 3+MR and severe TR.   On 01/29/20 woke from sleep for unknown reasons. Found to have shock for VT/VF. Saw [name]   On 07/22/20 had multiple runs of NSVT, SVT and 1 run of VT (20 seconds max rate 220). Felt to have gotten inappropriate ATP for AF.   Seen in 9/21 for ICD firing due to AF with RVR. Started on amio and b-blocker stopped due to bradycardia.   Echo 05/29/20: EF 20% mod to severe RV dysfunction severe central MR Personally reviewed  Here for routine f/u. Feels pretty good. Recently started on synthroid and BP went high but now back down. Remains fairly active. Playing golf 3-4x/week. Denies CP, orthopnea or PND. Gets 10K steps/day    Cath 09/20/19   1. 3v CAD  2. Patent LIMA to LAD 3. Patent free RIMA to ramus 4. Occluded SVG to RCA with widely patent native RCA 5. Unable to locate SVG to OM2 mentioned in OP report. There is no graft marker for this graft and Ar root shot did not show any SVG to OM (suspect may not have been placed) 6. EF 20%  with global HK and 3+ MR   Ao = 108/53 (72) LV = 109/19 RA = 9 RV = 60/10 PA = 63/23 (36) PCW = 22 (v = 35-40) Fick cardiac output/index = 4.5/2.4 PVR = 3.0 WU SVR 1310 Ao sat = 99% PA sat = 66%, 69%  CPX 06/2020  FVC 3.54 (85%)    FEV1 2.81 (89%)     FEV1/FVC 79 (104%)     MVV 104 (82%)      Resting HR: 43 Standing HR:47 Peak HR: 104  (69% age predicted max HR)  BP rest: 84/60 Standing BP: 76/58 BP peak: 108/60  Peak VO2: 27.5 (99% predicted peak VO2)  VE/VCO2 slope: 30  OUES: 1.99  Peak RER: 0.95  Ventilatory Threshold: 23.7 (85% predicted or measured peak VO2)  Peak RR 39  Peak Ventilation: 63.0  VE/MVV: 61%  PETCO2 at peak: 33  O2pulse: 19  (146% predicted O2pulse)    CPX test 10/20/19   FVC 3.22 (81%)    FEV1 2.54 (84%)     FEV1/FVC 79 (103%)     MVV 92 (76%)     Resting HR: 44 Standing HR: 47 Peak HR: 107  (71% age predicted max HR)  BP rest: 92/56 Standing BP: 86/52 BP peak: 116/62  Peak VO2: 24.5 (88% predicted peak VO2)  VE/VCO2 slope: 37  OUES: 2.11  Peak RER: 1.01 O2pulse: 19  (127% predicted O2pulse)    TEE 10/24/19 1. Left ventricular ejection fraction 20-25% 2. Global right ventricle has moderately reduced systolic function. 3. Left atrial size was severely dilated. 4. No LAA clot. 5. Right atrial size was mild-moderately dilated. 6. The mitral valve is abnormal. Moderate mitral valve regurgitation. 7. Mildly restricted posterior MV leaflet with 3+ MR. 8. The tricuspid valve is normal in structure. Tricuspid valve regurgitation is severe.  Past Medical History:  Diagnosis Date  . AICD (automatic cardioverter/defibrillator) present 6/09   St. Jude  . Bradycardia    Use of beta blocker limited  . CAD (coronary artery disease)    a- S/p cabg in march 2006 by Dr.Owen;  b. myoview 5/12: large IL scar from apex to base, no ischemia, EF 37%  . Cancer ([place])    on right side of neck  . ED (erectile  dysfunction)   . Gout   . Hyperlipidemia   . Ischemic cardiomyopathy    a-Echocardiogram, Nov 2006, showed ejection fraction of 30-40% with mild to moderated mitral  regurgitation and mild aortic regurgitation b- Cardiac MRI, May 2007, EF of 44% with 50% scar involving  the inferolateral walls. No comment of mitral regurgitation. c- last echo  11/10: EF 30-35%  mod MR d- Nega T-Wave alternans testing in May of 2007 e- no ACE due to low BP;  f. CPX 3/11: normal  . 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 Systolic CHF, chronic ([place])   . Thyroid disease    Past Surgical History:  Procedure Laterality Date  . CARDIAC DEFIBRILLATOR PLACEMENT  06/01/08   ICD  . COLONOSCOPY    . COLONOSCOPY WITH PROPOFOL N/A 07/28/2019   Procedure: COLONOSCOPY WITH PROPOFOL;  Surgeon: [name], MD;  Location: [facility];  Service: Endoscopy;  Laterality: N/A;  . CORONARY ARTERY BYPASS GRAFT  02-2005  . ICD GENERATOR CHANGEOUT N/A 02/03/2017   Procedure: ICD Generator Changeout;  Surgeon: [name], MD;  Location: [place] [facility];  Service: Cardiovascular;  Laterality: N/A;  . ORIF [place]   left   . RIGHT/LEFT HEART CATH AND CORONARY/GRAFT ANGIOGRAPHY N/A 09/20/2019   Procedure: RIGHT/LEFT HEART CATH AND CORONARY/GRAFT ANGIOGRAPHY;  Surgeon: [name], MD;  Location: [facility];  Service: Cardiovascular;  Laterality: N/A;  . SHOULDER ARTHROSCOPY  02/2010   rt   . TEE WITHOUT CARDIOVERSION N/A 10/24/2019   Procedure: TRANSESOPHAGEAL ECHOCARDIOGRAM (TEE);  Surgeon: [name], MD;  Location: [facility];  Service: Cardiovascular;  Laterality: N/A;  . TONSILLECTOMY     as a child     Current Outpatient Medications  Medication Sig Dispense Refill  . acetaminophen (TYLENOL) 325 MG tablet Take 650 mg by mouth every 6 (six) hours as needed (for pain.).    Marland Kitchen amiodarone (PACERONE) 200 MG tablet TAKE 1 TABLET BY MOUTH EVERY DAY 30  tablet 3  . apixaban (ELIQUIS) 5 MG TABS tablet Take 1 tablet (5 mg total) by mouth 2 (two) times daily. 60 tablet 11  . candesartan (ATACAND) 8 MG tablet Take 0.5 tablets (4 mg total) by mouth 2 (two) times daily. 90 tablet 3  . carboxymethylcellul-glycerin (OPTIVE) 0.5-0.9 % ophthalmic solution Place 1 drop into both eyes daily.    . cetirizine (ZYRTEC) 10 MG tablet Take 10 mg by mouth as needed for allergies.    Marland Kitchen  Colchicine (MITIGARE) 0.6 MG CAPS Take 0.6 mg by mouth 2 (two) times daily as needed (gout flare). 60 capsule 0  . FARXIGA 10 MG TABS tablet TAKE 1 TABLET BY MOUTH BEFORE BREAKFAST 90 tablet 3  . furosemide (LASIX) 40 MG tablet TAKE 1 TABLET EVERY DAY 90 tablet 3  . levothyroxine (SYNTHROID) 88 MCG tablet Take 1 tablet (88 mcg total) by mouth daily. 30 tablet 2  . nitroGLYCERIN (NITROSTAT) 0.4 MG SL tablet Place 1 tablet (0.4 mg total) under the tongue every 5 (five) minutes x 3 doses as needed for chest pain. 25 tablet 3  . Omega-3 Fatty Acids (FISH OIL) 1200 MG CAPS Take 3,600 mg by mouth daily.    . rosuvastatin (CRESTOR) 40 MG tablet Take 1 tablet (40 mg total) by mouth at bedtime. 90 tablet 3  . spironolactone (ALDACTONE) 25 MG tablet TAKE 1/2 TABLET BY MOUTH EVERY DAY 45 tablet 3   No current facility-administered medications for this encounter.    Allergies:   Atorvastatin, Entresto [sacubitril-valsartan], Sulfonamide derivatives, Tape, and Sulfamethoxazole   Social History:  The patient  reports that he quit smoking about 21 years ago. His smoking use included cigarettes. He smoked 0.10 packs per day. He has never used smokeless tobacco. He reports current alcohol use. He reports that he does not use drugs.   Family History:  The patient's family history includes Cardiomyopathy in his brother; Heart attack (age of onset: 81) in his father.   ROS:  Please see the history of present illness.   All other systems are personally reviewed and negative.   Vitals:   04/25/21  1038  BP: 102/60  Pulse: (!) 49  SpO2: 99%  Weight: 74.9 kg (165 lb 3.2 oz)    Exam:   General:  Well appearing. No resp difficulty HEENT: normal Neck: supple. no JVD. Carotids 2+ bilat; no bruits. No lymphadenopathy or thryomegaly appreciated. Cor: PMI nondisplaced. Regular rate & rhythm. 2/6 MR Lungs: clear Abdomen: soft, nontender, nondistended. No hepatosplenomegaly. No bruits or masses. Good bowel sounds. Extremities: no cyanosis, clubbing, rash, edema Neuro: alert & orientedx3, cranial nerves grossly intact. moves all 4 extremities w/o difficulty. Affect pleasant  ECG: sinus brady 57 RBBB. 1 PVC Personally reviewed  ICD: No VT/AF. Volume ok Personally reviewed    Recent Labs: 09/13/2020: ALT 29 12/25/2020: B Natriuretic Peptide 194.7; BUN 19; Creatinine, Ser 0.99; Hemoglobin 14.9; Platelets 204; Potassium 4.0; Sodium 136 04/24/2021: TSH 2.08  Personally reviewed   Wt Readings from Last 3 Encounters:  04/25/21 74.9 kg (165 lb 3.2 oz)  12/25/20 76.1 kg (167 lb 12.8 oz)  11/30/20 73.6 kg (162 lb 3.2 oz)      ASSESSMENT AND PLAN:  1. Chronic Systolic Heart Failure ICM ECHO 08/2013 EF 45-50% Has St Jude ICD 2009 - Echo 07/2016 LVEF 30-35%, Mod MR, Mod LAE, Mildly dilated RV with mild reduced RV function, PA peak pressure 51 mm Hg.  - TEE 11/20 Left ventricular ejection fraction 20-25% Moderately reduced RV function 3+ MR severe TR - Echo 6/21 LV is markedly dilated with EF 15-20% severe MR. RV at least moderately reduced. Severe TR - Stable NYHA II-early III. Volume status ok on lasix 40 daily.  - CPX 11/20 with preserved VO2 with moderately elevated slope - CPX 7/21 Peak VO2: 27.5 (99% predicted peak VO2)  VE/VCO2 slope: 30. RER 0.95 - Difficult situation. He has severe biventricular dysfunction with severe MR/TR. But CPX from 7/21 was suprisingly good.  I worry that  at some point in the relatively near future his exercise capacity may fall off quickly but currently he is  not candidate for transplant or VAD. I have reviewed his case with structural team and they feel he likely would not benefit from MitraClip (more like [place] patient). I also discussed with Dr. Mosetta Pigeon at [facility] recently and based on CPX and h/o relatively recent head & neck cancer risk of transplant/immunosuppression likely outweighs benefits of transplant given his pVO2. - Off Toprol XL 12.5 mg with addition of amio and bradycardia -Continue candesartan 4 mg bid. Did not tolerate Entresto 24/26 bid - Continue Farxiga 10  - Continue spiro 12.5  - Continue lasix 40 daily - Blood Type O positive - Labs today  - ICD interrogated in clinic. Volume status ok. No VT/AF.   2. PAF with ICD firing in 9/21 - in NSR. Continue amio - no further ICD firing  3. CAD S/P CABG 2006 -No s/s angina  - Coronary angio 10/20 with stable revascularization. -Continue statin. Off ASA with Eliquis use.  4. Hyperlipidemia - Continue Crestor. Goal LDL < 70  - Per PCP  5. RBBB - stable   6. Throat Cancer - on R side. Squamous Cell Carcinoma with unclear primary.  - s/p chemo/XRT at [facility] in 2018 (Finished treatments in 2/19) - remains cancer free on recent eval  7. Severe mitral regurgitation - plan as above. Echo reviewed by Structural Team felt not to be candidate for MitraClip - no change    Signed, Glori Bickers, MD  04/25/2021 11:16 AM  Advanced Heart Failure [address] [place] [address] [phone] (office) [phone] (fax)

## 2021-04-30 ENCOUNTER — Ambulatory Visit (INDEPENDENT_AMBULATORY_CARE_PROVIDER_SITE_OTHER): Payer: Medicare HMO

## 2021-04-30 ENCOUNTER — Other Ambulatory Visit (HOSPITAL_COMMUNITY): Payer: Self-pay | Admitting: Internal Medicine

## 2021-04-30 DIAGNOSIS — I429 Cardiomyopathy, unspecified: Secondary | ICD-10-CM

## 2021-04-30 DIAGNOSIS — I428 Other cardiomyopathies: Secondary | ICD-10-CM

## 2021-04-30 LAB — CUP PACEART REMOTE DEVICE CHECK
Battery Remaining Longevity: 62 mo
Battery Remaining Percentage: 65 %
Battery Voltage: 2.96 V
Brady Statistic RV Percent Paced: 1 %
Date Time Interrogation Session: 20220517040016
HighPow Impedance: 60 Ohm
HighPow Impedance: 61 Ohm
Implantable Lead Implant Date: 20090618
Implantable Lead Location: 753860
Implantable Lead Model: 7121
Implantable Pulse Generator Implant Date: 20180220
Lead Channel Impedance Value: 430 Ohm
Lead Channel Pacing Threshold Amplitude: 2 V
Lead Channel Pacing Threshold Pulse Width: 0.6 ms
Lead Channel Sensing Intrinsic Amplitude: 4.9 mV
Lead Channel Setting Pacing Amplitude: 3.5 V
Lead Channel Setting Pacing Pulse Width: 0.6 ms
Lead Channel Setting Sensing Sensitivity: 0.5 mV
Pulse Gen Serial Number: 7406967

## 2021-05-09 ENCOUNTER — Other Ambulatory Visit (HOSPITAL_COMMUNITY): Payer: Self-pay | Admitting: Internal Medicine

## 2021-05-09 ENCOUNTER — Other Ambulatory Visit: Payer: Self-pay | Admitting: Internal Medicine

## 2021-05-12 ENCOUNTER — Other Ambulatory Visit (HOSPITAL_COMMUNITY): Payer: Self-pay | Admitting: Internal Medicine

## 2021-05-16 ENCOUNTER — Ambulatory Visit (INDEPENDENT_AMBULATORY_CARE_PROVIDER_SITE_OTHER): Payer: Medicare HMO | Admitting: Pharmacist

## 2021-05-16 DIAGNOSIS — E785 Hyperlipidemia, unspecified: Secondary | ICD-10-CM

## 2021-05-16 DIAGNOSIS — I5022 Chronic systolic (congestive) heart failure: Secondary | ICD-10-CM

## 2021-05-16 DIAGNOSIS — I25810 Atherosclerosis of coronary artery bypass graft(s) without angina pectoris: Secondary | ICD-10-CM | POA: Diagnosis not present

## 2021-05-16 NOTE — Chronic Care Management (AMB) (Signed)
Chronic Care Management Pharmacy Note  05/16/2021 Name:  Matthew KOCAK Sr. MRN:  [id number] DOB:  [birth year]  Subjective: Matthew Romans Sr. is an 70 y.o. year old male who is a primary patient of Paz, Alda Berthold, MD.  The CCM team was consulted for assistance with disease management and care coordination needs.    Engaged with patient by telephone for follow up visit in response to provider referral for pharmacy case management and/or care coordination services.   Consent to Services:  The patient was given information about Chronic Care Management services, agreed to services, and gave verbal consent prior to initiation of services.  Please see initial visit note for detailed documentation.   Patient Care Team: Colon Branch, MD as PCP - General Bensimhon, Shaune Pascal, MD as Consulting Physician (Cardiology) Memory Argue, MD as Referring Physician [name], MD as Consulting Physician (Cardiology) Cherre Robins, PharmD (Pharmacist)  Recent office visits: 03/14/2021 - Lab - TSH elevated - Levothyroxine increased to 87mg daily 01/17/2021 - Lab - TSH still elevated at 8.55. Levothyroxine increased to 50 mcg daily  12/18/2020 - Lab TSH rechecked and was higher. Levothyroxine 229m started.  06/14/2020 - PCP (Dr PaLarose KellsF/U chronic conditions/ CPE: labs checked. TSH noted to be slightly elevated but no meds added. Plan is to recheck labs again.   Recent consult visits: 04/25/2021 - Cardio (Dr BeHaroldine Lawsf/u CHF, CAD and afib. No med changes.   01/10/2021 - Rad Onc (Dr PaMalva Limes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. F/u 6 months with flexible nasolaryngoscopy or sooner should symptoms warrant. He will continue fluoride trays nightly and see his dentist biannually.   12/25/2020 - Cardio (Dr BeHaroldine Laws F/U CHF. Started Entresto 24/2639mwice daily. Stopped candesartan 8mg64m(patient later stopped Entresto and restarted candesartan due to  intolerance to [facility]: None in previous 6 months  Objective:  Lab Results  Component Value Date   CREATININE 1.10 04/25/2021   CREATININE 0.99 12/25/2020   CREATININE 0.94 10/11/2020    Lab Results  Component Value Date   HGBA1C 6.2 06/14/2020   Last diabetic Eye exam: No results found for: HMDIABEYEEXA  Last diabetic Foot exam: No results found for: HMDIABFOOTEX      Component Value Date/Time   CHOL 147 04/25/2021 1133   TRIG 55 04/25/2021 1133   TRIG 45 12/16/2006 0912   HDL 77 04/25/2021 1133   CHOLHDL 1.9 04/25/2021 1133   VLDL 11 04/25/2021 1133   LDLCALC 59 04/25/2021 1133    Hepatic Function Latest Ref Rng & Units 04/25/2021 09/13/2020 06/14/2020  Total Protein 6.5 - 8.1 g/dL 6.5 6.7 -  Albumin 3.5 - 5.0 g/dL 3.9 3.9 -  AST 15 - 41 U/L 34 25 21  ALT 0 - 44 U/L '27 29 18  ' Alk Phosphatase 38 - 126 U/L 55 63 -  Total Bilirubin 0.3 - 1.2 mg/dL 1.9(H) 2.3(H) -  Bilirubin, Direct 0.0 - 0.3 mg/dL - - -    Lab Results  Component Value Date/Time   TSH 2.08 04/24/2021 07:05 AM   TSH 5.87 (H) 03/14/2021 07:09 AM   FREET4 0.91 06/14/2020 08:36 AM   FREET4 1.28 06/03/2018 10:43 AM    CBC Latest Ref Rng & Units 04/25/2021 12/25/2020 10/11/2020  WBC 4.0 - 10.5 K/uL 5.9 6.3 6.9  Hemoglobin 13.0 - 17.0 g/dL 14.7 14.9 14.8  Hematocrit 39.0 - 52.0 % 45.8 47.1 45.5  Platelets 150 -  400 K/uL 201 204 193    No results found for: VD25OH  Clinical ASCVD: Yes  The ASCVD Risk score Mikey Bussing DC Jr., et al., 2013) failed to calculate for the following reasons:   The patient has a prior MI or stroke diagnosis    Other: CHADSVAsc = 5  Social History   Tobacco Use  Smoking Status Former Smoker  . Packs/day: 0.10  . Types: Cigarettes  . Quit date: 12/16/1999  . Years since quitting: 21.4  Smokeless Tobacco Never Used   BP Readings from Last 3 Encounters:  04/25/21 102/60  12/25/20 120/70  11/30/20 102/68   Pulse Readings from Last 3 Encounters:   04/25/21 (!) 49  12/25/20 (!) 49  11/30/20 (!) 52   Wt Readings from Last 3 Encounters:  04/25/21 165 lb 3.2 oz (74.9 kg)  12/25/20 167 lb 12.8 oz (76.1 kg)  11/30/20 162 lb 3.2 oz (73.6 kg)    Assessment: Review of patient past medical history, allergies, medications, health status, including review of consultants reports, laboratory and other test data, was performed as part of comprehensive evaluation and provision of chronic care management services.   SDOH:  (Social Determinants of Health) assessments and interventions performed:  SDOH Interventions   Flowsheet Row Most Recent Value  SDOH Interventions   Financial Strain Interventions Other (Comment)  [Applying for patient assistance for Farxiga and Eliquis]      CCM Care Plan  Allergies  Allergen Reactions  . Atorvastatin      myalgia, Muscle aching  . Entresto [Sacubitril-Valsartan] Itching    Had itching and redness  . Sulfonamide Derivatives     Rash and hot flashes  . Tape Other (See Comments)    Rips skin- use PAPER tape  . Sulfamethoxazole Rash and Other (See Comments)    Hot flashes    Medications Reviewed Today    Reviewed by Cherre Robins, PharmD (Pharmacist) on 05/16/21 at 78  Med List Status: <None>  Medication Order Taking? Sig Documenting Provider Last Dose Status Informant  acetaminophen (TYLENOL) 325 MG tablet [id number] Yes Take 650 mg by mouth every 6 (six) hours as needed (for pain.). [provider] Taking Active Self           Med Note Isac Caddy, Octavia Heir   Wed Oct 12, 2019  2:03 PM)    amiodarone (PACERONE) 200 MG tablet [id number] Yes TAKE 1 TABLET BY MOUTH EVERY DAY Bensimhon, Shaune Pascal, MD Taking Active   apixaban (ELIQUIS) 5 MG TABS tablet [id number] Yes Take 1 tablet (5 mg total) by mouth 2 (two) times daily. Patsey Berthold, NP Taking Active Self  candesartan (ATACAND) 8 MG tablet [id number] Yes Take 0.5 tablets (4 mg total) by mouth 2 (two) times daily. Bensimhon, Shaune Pascal, MD  Taking Active   carboxymethylcellul-glycerin (OPTIVE) 0.5-0.9 % ophthalmic solution [id number] Yes Place 1 drop into both eyes daily. [provider] Taking Active Self  cetirizine (ZYRTEC) 10 MG tablet [id number] Yes Take 10 mg by mouth as needed for allergies. [provider] Taking Active Self  Colchicine (MITIGARE) 0.6 MG CAPS [id number] Yes Take 0.6 mg by mouth 2 (two) times daily as needed (gout flare). Colon Branch, MD Taking Active Self           Med Note Jeanie Cooks Sep 12, 2020  3:38 PM)    FARXIGA 10 MG TABS tablet [id number] Yes TAKE 1 TABLET BY MOUTH EVERY DAY BEFORE BREAKFAST Bensimhon, Shaune Pascal, MD Taking  Active   furosemide (LASIX) 40 MG tablet [id number] Yes TAKE 1 TABLET EVERY DAY Bensimhon, Shaune Pascal, MD Taking Active   levothyroxine (SYNTHROID) 88 MCG tablet [id number] Yes Take 1 tablet (88 mcg total) by mouth daily. Colon Branch, MD Taking Active   nitroGLYCERIN (NITROSTAT) 0.4 MG SL tablet [id number] Yes Place 1 tablet (0.4 mg total) under the tongue every 5 (five) minutes x 3 doses as needed for chest pain. Colon Branch, MD Taking Active Self  Omega-3 Fatty Acids (FISH OIL) 1200 MG CAPS [id number] Yes Take 3,600 mg by mouth daily. [provider] Taking Active Self  rosuvastatin (CRESTOR) 40 MG tablet [id number] Yes Take 1 tablet (40 mg total) by mouth at bedtime. Colon Branch, MD Taking Active   spironolactone (ALDACTONE) 25 MG tablet [id number] Yes TAKE 1/2 TABLET BY MOUTH EVERY DAY Bensimhon, Shaune Pascal, MD Taking Active           Patient Active Problem List   Diagnosis Date Noted  . Heme + stool   . Internal and external prolapsed hemorrhoids   . Difficulty swallowing 01/21/2018  . 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 ([place]) 12/24/2017  . 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 PCP NOTES >>>>>>>>>>>>>>>>>>>>> 04/21/2016  . Seborrheic dermatitis  of scalp 04/11/2013  . Annual physical exam 04/11/2011  . MITRAL REGURGITATION 04/10/2010  . SYSTOLIC HEART FAILURE, CHRONIC 10/03/2009  . Hyperglycemia 06/19/2008  . Coronary atherosclerosis 06/14/2008  . Automatic implantable cardioverter-defibrillator in situ 06/01/2008  . ERECTILE DYSFUNCTION 12/17/2007  . Hyperlipidemia 02/06/2007  . Gout 02/06/2007  . RBBB 02/06/2007  . Congestive heart failure ([place]) 02/06/2007  . CORONARY ARTERY BYPASS GRAFT, FOUR VESSEL, HX OF 02/12/2005    Immunization History  Administered Date(s) Administered  . Fluad Quad(high Dose 65+) 10/25/2020  . Influenza Split 10/10/2011, 10/11/2012  . Influenza Whole 11/18/2007, 09/27/2008, 09/27/2009, 08/20/2010  . Influenza, High Dose Seasonal PF 11/25/2016, 10/22/2017, 10/21/2018, 10/19/2019  . Influenza,inj,Quad PF,6+ Mos 10/13/2013, 10/24/2014, 11/20/2015  . PFIZER(Purple Top)SARS-COV-2 Vaccination 02/03/2020, 02/22/2020, 09/18/2020  . Pneumococcal Conjugate-13 04/13/2014  . Pneumococcal Polysaccharide-23 03/07/2009, 11/20/2015  . Td 12/15/2006, 04/27/2017  . Zoster Recombinat (Shingrix) 04/30/2018, 07/02/2018  . Zoster, Live 04/11/2013    Conditions to be addressed/monitored: CHF, CAD, HTN, HLD and Thyroid disease, gout, metastatic throat cancer; pre diabetes  Care Plan : General Pharmacy (Adult)  Updates made by Cherre Robins, PHARMD since 05/16/2021 12:00 AM    Problem: Medication Adherence (Wellness)     Long-Range Goal: Medication Adherence Maintained   Start Date: 03/14/2021  This Visit's Progress: On track  Priority: High  Note:   Current Barriers:  . Suboptimal therapeutic regimen for thyroid disease . Usually enters coverage gap in June or July . Cost of Eliquis and [place] has increased with last fills  Pharmacist Clinical Goal(s):  Marland Kitchen Over the next 180 days, patient will verbalize ability to afford treatment regimen . adhere to plan to optimize therapeutic regimen for thyroid disease,  CHF, HTN and lipids as evidenced by report of adherence to recommended medication management changes through collaboration with PharmD and provider.  . Working with clinical pharmacist to apply for patient assistance for [place] and Eliquis  Interventions: . 1:1 collaboration with Colon Branch, MD regarding development and update of comprehensive plan of care as evidenced by provider attestation and co-signature . Inter-disciplinary care team collaboration (see longitudinal plan of care) . Comprehensive medication review performed; medication  list updated in electronic medical record  Pre - Diabetes: . Controlled; current treatment: diet and exercise (he is taking Farxiga 70m daily but this is mostly for CHF) . Current glucose readings: does not monitor BG at home . Current exercise: frequent golfing and some walking . Not addressed at this visit but patient is to continue previously addressed recommendations  Hypertension / CHF: . Controlled;  . current treatment:  o candasartan 862m- take 0.5 tablet = 54m20mwice a day o furosemide 44m82mily o spironolactone 25mg29make 0.5 tablet daily o Farxiga 10mg 60my  . Current home readings: 120's  / 70's . Unable to tolerate Entresto - caused itching and redness . Interventions:  o Reviewed importance of weighing daily and when to notify provider of weight increases.  . Recommended continue current therapy . Provided applications for FarxigCastaliaent assistance; patient to complete his portion and return to clinical pharmacist.   Hyperlipidemia: . Controlled . Current treatment: o rosuvastatin 44mg d46m  . Medications previously tried: atorvastatin  . . RecomMarland Kitchenended continue current therapy   Atrial Fibrillation: . Controlled . Current rate/rhythm control - amiodarone 200mg da38m. Anticoagulant treatment: Eliquis 5mg twic60maily . CHADS2VASc score: 4 . Interventions:  o Recommended continue current therapy o Patient is now in  Medicare Coverage gap; mailed application for Eliquis PAP; he will complete patient portion and return to clinical pharmacist; along with report with 2022 OOP expense for medicaitons.  Patient Goals/Self-Care Activities . Over the next 180 days, patient will:  take medications as prescribed and collaborate with provider on medication access solutions  Follow Up Plan: Telephone follow up appointment with care management team member scheduled for:  CMA will check in regarding PAP applications in 1 month; Clinical pharmacist will follow up in 3 months unless needed sooner.         Medication Assistance: Application for Farxiga and Eliquis  medication assistance program. in process.  Anticipated assistance start date 06/28/2021.  See plan of care for additional detail. Mailed patient applications to complete and return to office. Will also need copy of 2022 OOP expense for medications.  Patient's [facility]:  [id number] [place] [phone] Up:  Patient agrees to Care Plan and Follow-up.  Plan: Telephone follow up appointment with care management team member scheduled for:  June 2022 to assess for patient assistance need while in coverage gap.  Petrita Blunck EckCherre RobinsClinical Pharmacist [place] [facility]

## 2021-05-16 NOTE — Patient Instructions (Signed)
Visit Information  PATIENT GOALS: Goals Addressed            This Visit's Progress   . Chronic Care Management Pharmacy Care Plan   On track    CARE PLAN ENTRY (see longitudinal plan of care for additional care plan information)  Current Barriers:  . Chronic Disease Management support, education, and care coordination needs related to Pre-DM, HF, HTN, HLD, CAD, Gout, Pain, Dry Eye, Allergies   Hypertension Screening BP Readings from Last 3 Encounters:  04/25/21 102/60  12/25/20 120/70  11/30/20 102/68   . Pharmacist Clinical Goal(s): o Over the next 180 days, patient will work with PharmD and providers to maintain BP goal <130/80 . Current regimen:   Candesartan 8mg  - take 0.5 tablet = 4mg  twice a day   Spironolactone 25mg  - take 0.5 tablet daily . Patient self care activities - Over the next 180 days, patient will: o Maintain hypertension medication regimen. o Continue to check blood pressure and heart rate every other day  Irregular Heart Rate: . Pharmacist Clinical Goal(s): o Over the next 180 days, patient will work with PharmD and providers to maintain heart rate in normal sinus rhythm and prevent stroke; Also will work with patient to apply for assistance with Eliquis cost . Current regimen:   Eliquis 5mg  take 1 tablet twice a day  Amiodarone 200mg  daily . Patient self care activities - Over the next 180 days, patient will: o Maintain amiodarone for heart rate / rhythm and continue to take Eliquis for stroke prevention  o Complete application for medication assistance for Eliquis and return to clinical pharmacist at Dr Ethel Rana office.  Hyperlipidemia Lab Results  Component Value Date/Time   LDLCALC 59 04/25/2021 11:33 AM   . Pharmacist Clinical Goal(s): o Over the next 180 days, patient will work with PharmD and providers to maintain LDL goal < 70 . Current regimen:  o Rosuvastatin 40mg  daily . Patient self care activities - Over the next 180 days, patient  will: o Maintain cholesterol medication regimen.   Pre-Diabetes Lab Results  Component Value Date/Time   HGBA1C 6.2 06/14/2020 08:36 AM   HGBA1C 6.4 06/09/2019 08:25 AM   . Pharmacist Clinical Goal(s): o Over the next 180 days, patient will work with PharmD and providers to maintain A1c goal <6.5% . Current regimen:  o [place] 10mg  daily (primarily for heart failure diagnosis) . Interventions: o Discussed diet and exercise o Discussed patient assistance options now that patient is in Medicare coverage gap . Patient self care activities - Over the next 180 days, patient will: o Maintain a1c <6.5% o Complete [place] and [place] application and return to office.   Heart Failure . Pharmacist Clinical Goal(s) o Over the next 180 days, patient will work with PharmD and providers to reduce symptoms of heart failure . Current regimen:   Candesartan 8mg  - take 0.5 tablet = 4mg  twice a day   Farxiga 10mg  daily  Furosemide 40mg  daily  Spironolactone 25mg  - take 0.5 tablet daily  . Interventions: o Reviewed importance of daily weights and when to report weight gain  - More than 3lbs in 1 day  - More than 5lbs in 1 week  - Contact cardio, primary care, or pharmacy team . Patient self care activities - Over the next 180 days, patient will: o Weigh daily o Maintain heart failure medication regimen o Complete application for Farxiga patient assistance and return to clinical pharmacist.   Medication management . Pharmacist Clinical Goal(s): o Over  the next 180 days, patient will work with PharmD and providers to maintain optimal medication adherence . [facility] . Interventions o Comprehensive medication review performed. o Continue current medication management strategy o Discussed options for assistance when he reaches Medicare coverage gap.  . Patient self care activities - Over the next 180 days, patient will: o Focus on medication adherence by filling and taking medications  appropriately o Take medications as prescribed o Report any questions or concerns to PharmD and/or provider(s)   Please see past updates related to this goal by clicking on the "Past Updates" button in the selected goal        The patient verbalized understanding of instructions, educational materials, and care plan provided today and declined offer to receive copy of patient instructions, educational materials, and care plan.   The care management team will reach out to the patient again over the next 45 days.   Cherre Robins, PharmD Clinical Pharmacist [place] [facility] [phone]

## 2021-05-23 NOTE — Progress Notes (Signed)
Remote ICD transmission.   

## 2021-06-09 ENCOUNTER — Other Ambulatory Visit: Payer: Self-pay | Admitting: Internal Medicine

## 2021-06-09 ENCOUNTER — Other Ambulatory Visit (HOSPITAL_COMMUNITY): Payer: Self-pay | Admitting: Internal Medicine

## 2021-06-14 ENCOUNTER — Telehealth: Payer: Self-pay | Admitting: Internal Medicine

## 2021-06-14 ENCOUNTER — Telehealth: Payer: Self-pay | Admitting: Pharmacist

## 2021-06-14 NOTE — Progress Notes (Cosign Needed)
error 

## 2021-06-14 NOTE — Chronic Care Management (AMB) (Signed)
    Chronic Care Management Pharmacy Assistant   Name: Matthew FRANKIE Sr.  MRN: [id number] DOB: 1951/09/27   Reason for Encounter: [place] office visits:  No recent visits noted  Recent consult visits:  No recent visits noted  Hospital visits:  None in previous 6 months  Medications: Outpatient Encounter Medications as of 06/14/2021  Medication Sig   acetaminophen (TYLENOL) 325 MG tablet Take 650 mg by mouth every 6 (six) hours as needed (for pain.).   amiodarone (PACERONE) 200 MG tablet TAKE 1 TABLET BY MOUTH EVERY DAY   apixaban (ELIQUIS) 5 MG TABS tablet Take 1 tablet (5 mg total) by mouth 2 (two) times daily.   candesartan (ATACAND) 8 MG tablet Take 0.5 tablets (4 mg total) by mouth 2 (two) times daily.   carboxymethylcellul-glycerin (OPTIVE) 0.5-0.9 % ophthalmic solution Place 1 drop into both eyes daily.   cetirizine (ZYRTEC) 10 MG tablet Take 10 mg by mouth as needed for allergies.   Colchicine (MITIGARE) 0.6 MG CAPS Take 0.6 mg by mouth 2 (two) times daily as needed (gout flare).   FARXIGA 10 MG TABS tablet TAKE 1 TABLET BY MOUTH EVERY DAY BEFORE BREAKFAST   furosemide (LASIX) 40 MG tablet TAKE 1 TABLET EVERY DAY   levothyroxine (SYNTHROID) 88 MCG tablet Take 1 tablet (88 mcg total) by mouth daily before breakfast.   nitroGLYCERIN (NITROSTAT) 0.4 MG SL tablet Place 1 tablet (0.4 mg total) under the tongue every 5 (five) minutes x 3 doses as needed for chest pain.   Omega-3 Fatty Acids (FISH OIL) 1200 MG CAPS Take 3,600 mg by mouth daily.   rosuvastatin (CRESTOR) 40 MG tablet Take 1 tablet (40 mg total) by mouth at bedtime.   spironolactone (ALDACTONE) 25 MG tablet TAKE 1/2 TABLET BY MOUTH EVERY DAY   No facility-administered encounter medications on file as of 06/14/2021.    Have you had any problems recently with your health? Patient states he is not having any problems with his health. He states he is doing better then he has in a long  time.  Have you had any problems with your pharmacy? Patient states he does not have any problems with his pharmacy.  What issues or side effects are you having with your medications? Patient states he is not having any issues with his medications.  What would you like me to pass along to [employer], CPP for them to help you with?  Patient states there is nothing at this time.  What can we do to take care of you better? Patient states there is nothing at this time.   [place] regarding Eliquis application. Representative states they have not received application.Patient states he left  the application at office at last appointment.  Called [facility] regarding application for [place]. Patient states he completed and left application at last office visit. Representative states they have not received an application for the patient.  Star Rating Drugs: Rosuvastatin 40 mg - last filled 05/24/2021 90 days Candesartan 8 mg - last filled 04/01/2021 90 days  [place] Pharmacist Assistant [phone]

## 2021-06-18 ENCOUNTER — Other Ambulatory Visit: Payer: Self-pay

## 2021-06-18 ENCOUNTER — Encounter: Payer: Self-pay | Admitting: Internal Medicine

## 2021-06-18 ENCOUNTER — Ambulatory Visit (INDEPENDENT_AMBULATORY_CARE_PROVIDER_SITE_OTHER): Payer: Medicare HMO | Admitting: Internal Medicine

## 2021-06-18 VITALS — BP 108/60 | HR 63 | Temp 97.8°F | Resp 16 | Ht 69.0 in | Wt 167.4 lb

## 2021-06-18 DIAGNOSIS — Z0000 Encounter for general adult medical examination without abnormal findings: Secondary | ICD-10-CM | POA: Diagnosis not present

## 2021-06-18 DIAGNOSIS — R739 Hyperglycemia, unspecified: Secondary | ICD-10-CM

## 2021-06-18 DIAGNOSIS — E038 Other specified hypothyroidism: Secondary | ICD-10-CM

## 2021-06-18 LAB — PSA: PSA: 0.48 ng/mL (ref 0.10–4.00)

## 2021-06-18 LAB — HEMOGLOBIN A1C: Hgb A1c MFr Bld: 6 % (ref 4.6–6.5)

## 2021-06-18 NOTE — Patient Instructions (Addendum)
Check the  blood pressure regularly BP GOAL is between 110/65 and  135/85. If it is consistently higher or lower, let me know  Proceed with COVID-vaccine during the fall  GO TO THE LAB : Get the blood work     [place], [place] back for blood work in 4 months (check your thyroid)  Come back for a physical exam in 1 year

## 2021-06-18 NOTE — Progress Notes (Signed)
Subjective:    Patient ID: Matthew Romans Sr., male    DOB: 11/10/1951, 70 y.o.   MRN: [id number]  DOS:  06/18/2021 Type of visit - description: CPX  Since the last office visit is doing well. Reports that BP at home varies.  Review of Systems Since throat cancer treatment, has occasional dysphagia, taste and smell is not the same  Other than above, a 14 point review of systems is negative       Past Medical History:  Diagnosis Date   AICD (automatic cardioverter/defibrillator) present 6/09   St. Jude   Bradycardia    Use of beta blocker limited   CAD (coronary artery disease)    a- S/p cabg in march 2006 by Dr.Owen;  b. [name] 5/12: large IL scar from apex to base, no ischemia, EF 37%   Cancer (HCC)    on right side of neck   ED (erectile dysfunction)    Gout    Hyperlipidemia    Ischemic cardiomyopathy    a-Echocardiogram, Nov 2006, showed ejection fraction of 30-40% with mild to moderated mitral  regurgitation and mild aortic regurgitation b- Cardiac MRI, May 2007, EF of 44% with 50% scar involving  the inferolateral walls. No comment of mitral regurgitation. c- last echo  11/10: EF 30-35%  mod MR d- Nega T-Wave alternans testing in May of 2007 e- no ACE due to low BP;  f. CPX 3/11: normal   RBBB (right bundle branch block with left anterior fascicular block)    Systolic CHF, chronic (HCC)    Thyroid disease     Past Surgical History:  Procedure Laterality Date   CARDIAC DEFIBRILLATOR PLACEMENT  06/01/08   ICD   COLONOSCOPY     COLONOSCOPY WITH PROPOFOL N/A 07/28/2019   Procedure: COLONOSCOPY WITH PROPOFOL;  Surgeon: [name], MD;  Location: [facility];  Service: Endoscopy;  Laterality: N/A;   CORONARY ARTERY BYPASS GRAFT  02-2005   ICD GENERATOR CHANGEOUT N/A 02/03/2017   Procedure: ICD Generator Changeout;  Surgeon: [name], MD;  Location: [facility];  Service: Cardiovascular;  Laterality: N/A;   ORIF ANKLE FRACTURE  1987   left     RIGHT/LEFT HEART CATH AND CORONARY/GRAFT ANGIOGRAPHY N/A 09/20/2019   Procedure: RIGHT/LEFT HEART CATH AND CORONARY/GRAFT ANGIOGRAPHY;  Surgeon: [name], MD;  Location: [facility];  Service: Cardiovascular;  Laterality: N/A;   SHOULDER ARTHROSCOPY  02/2010   rt    TEE WITHOUT CARDIOVERSION N/A 10/24/2019   Procedure: TRANSESOPHAGEAL ECHOCARDIOGRAM (TEE);  Surgeon: [name], MD;  Location: [facility];  Service: Cardiovascular;  Laterality: N/A;   TONSILLECTOMY     as a child   Social History   Socioeconomic History   Marital status: Married    Spouse name: Not on file   Number of children: 2   Years of education: Not on file   Highest education level: Not on file  Occupational History   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     Employer: [employer]  Tobacco Use   Smoking status: Former    Packs/day: 0.10    Pack years: 0.00    Types: Cigarettes    Quit date: 12/16/1999    Years since quitting: 21.5   Smokeless tobacco: Never  Vaping Use   Vaping Use: Never used  Substance and Sexual Activity   Alcohol use: Yes    Comment: socially   Drug use:  No   Sexual activity: Not on file  Other Topics Concern   Not on file  Social History Narrative   Retired    household- pt and wife   2 adult children in [place] also has grandchildren   Former but not current smoker, occasional alcohol no drug use   Social Determinants of Radio broadcast assistant Strain: Medium Risk   Difficulty of Paying Living Expenses: Somewhat hard  Food Insecurity: Not on file  Transportation Needs: Not on file  Physical Activity: Insufficiently Active   Days of Exercise per Week: 2 days   Minutes of Exercise per Session: 60 min  Stress: Not on file  Social Connections: Not on file  Intimate Partner Violence: Not on file    Allergies as of 06/18/2021       Reactions   Atorvastatin     myalgia, Muscle aching    Entresto [sacubitril-valsartan] Itching   Had itching and redness   Sulfonamide Derivatives    Rash and hot flashes   Tape Other (See Comments)   Rips skin- use PAPER tape   Sulfamethoxazole Rash, Other (See Comments)   Hot flashes        Medication List        Accurate as of June 18, 2021  9:36 PM. If you have any questions, ask your nurse or doctor.          acetaminophen 325 MG tablet Commonly known as: TYLENOL Take 650 mg by mouth every 6 (six) hours as needed (for pain.).   amiodarone 200 MG tablet Commonly known as: PACERONE TAKE 1 TABLET BY MOUTH EVERY DAY   apixaban 5 MG Tabs tablet Commonly known as: ELIQUIS Take 1 tablet (5 mg total) by mouth 2 (two) times daily.   candesartan 8 MG tablet Commonly known as: ATACAND Take 0.5 tablets (4 mg total) by mouth 2 (two) times daily.   cetirizine 10 MG tablet Commonly known as: ZYRTEC Take 10 mg by mouth as needed for allergies.   Colchicine 0.6 MG Caps Commonly known as: Mitigare Take 0.6 mg by mouth 2 (two) times daily as needed (gout flare).   Farxiga 10 MG Tabs tablet Generic drug: dapagliflozin propanediol TAKE 1 TABLET BY MOUTH EVERY DAY BEFORE BREAKFAST   Fish Oil 1200 MG Caps Take 3,600 mg by mouth daily.   furosemide 40 MG tablet Commonly known as: LASIX TAKE 1 TABLET EVERY DAY   levothyroxine 88 MCG tablet Commonly known as: SYNTHROID Take 1 tablet (88 mcg total) by mouth daily before breakfast.   nitroGLYCERIN 0.4 MG SL tablet Commonly known as: NITROSTAT Place 1 tablet (0.4 mg total) under the tongue every 5 (five) minutes x 3 doses as needed for chest pain.   OPTIVE 0.5-0.9 % ophthalmic solution Generic drug: carboxymethylcellul-glycerin Place 1 drop into both eyes daily.   rosuvastatin 40 MG tablet Commonly known as: CRESTOR Take 1 tablet (40 mg total) by mouth at bedtime.   spironolactone 25 MG tablet Commonly known as: ALDACTONE TAKE 1/2 TABLET BY MOUTH EVERY DAY            Objective:   Physical Exam BP 108/60 (BP Location: Left Arm, Patient Position: Sitting, Cuff Size: Small)   Pulse 63   Temp 97.8 F (36.6 C) (Oral)   Resp 16   Ht 5\' 9"  (1.753 m)   Wt 167 lb 6 oz (75.9 kg)   SpO2 96%   BMI 24.72 kg/m  General: Well developed, NAD, BMI noted Neck:  No  thyromegaly  HEENT:  Normocephalic . Face symmetric, atraumatic Lungs:  CTA B Normal respiratory effort, no intercostal retractions, no accessory muscle use. Heart: Trace systolic murmur Abdomen:  Not distended, soft, non-tender. No rebound or rigidity.   Lower extremities: no pretibial edema bilaterally  Skin: Exposed areas without rash. Not pale. Not jaundice DRE: Normal sphincter tone, brown stools, prostate normal Neurologic:  alert & oriented X3.  Speech normal, gait appropriate for age and unassisted Strength symmetric and appropriate for age.  Psych: Cognition and judgment appear intact.  Cooperative with normal attention span and concentration.  Behavior appropriate. No anxious or depressed appearing.     Assessment    Assessment Prediabetes Hyperlipidemia Gout Hypothyroidism E.D. 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 S/p chemo x 6, XRT x 35; treatment ended 01/2018   PLAN: Here for CPX Prediabetes: Check A1c Hyperlipidemia: Recent FLP very good. Gout: No recent events Cardiovascular: Saw cardiology 04/25/2021, intolerant to [facility], ICD interrogated, was on NSR, severe mitral regurgitation noted as well. SCC throat: Stable, seen by oncology team 01-10-21 Hypothyroidism: On Synthroid,reports that since he started thyroid medicine, his blood pressure varies and wonders if there is a relationship (I doubt it), rec cont w/ synthroid,  monitor BPs , TSH in 4 months RTC 1 year      This visit occurred during the SARS-CoV-2 public health emergency.  Safety protocols were in place, including screening questions prior to the visit, additional usage of staff PPE, and extensive cleaning of exam room while observing appropriate contact time as indicated for disinfecting solutions.

## 2021-06-18 NOTE — Assessment & Plan Note (Signed)
Here for CPX Prediabetes: Check A1c Hyperlipidemia: Recent FLP very good. Gout: No recent events Cardiovascular: Saw cardiology 04/25/2021, intolerant to [facility], ICD interrogated, was on NSR, severe mitral regurgitation noted as well. SCC throat: Stable, seen by oncology team 01-10-21 Hypothyroidism: On Synthroid,reports that since he started thyroid medicine, his blood pressure varies and wonders if there is a relationship (I doubt it), rec cont w/ synthroid, monitor BPs , TSH in 4 months RTC 1 year

## 2021-06-18 NOTE — Assessment & Plan Note (Signed)
--  Td 04/2017; pneumonia shot 2009 and 2016, Prevnar 2015; zostavax 2014; s/p shingrex x 2 -  S/p Covid shots x 3, booster rec at some point this year - rec  flu shot q year --CCS: 01-2008: Cscope, tics, no polyps; + IFOB  06/2019, cscope 07/2019 no polyps, + hemorrhoids, no further CCS per cscope report --Prostate cancer screening: DRE normal, check PSA. --Diet and exercise discussed --labs: A1c -POA discussed.

## 2021-06-27 ENCOUNTER — Ambulatory Visit (INDEPENDENT_AMBULATORY_CARE_PROVIDER_SITE_OTHER): Payer: Medicare HMO | Admitting: Pharmacist

## 2021-06-27 DIAGNOSIS — I509 Heart failure, unspecified: Secondary | ICD-10-CM

## 2021-06-27 DIAGNOSIS — R739 Hyperglycemia, unspecified: Secondary | ICD-10-CM

## 2021-06-27 NOTE — Chronic Care Management (AMB) (Signed)
Chronic Care Management Pharmacy Note  06/27/2021 Name:  Matthew WIGLEY Sr. MRN:  [id number] DOB:  June 30, 1951  Subjective: Matthew Romans Sr. is an 70 y.o. year old male who is a primary patient of Paz, Alda Berthold, MD.  The CCM team was consulted for assistance with disease management and care coordination needs.    Collaboration with PCP and patient assistance program  for  completion of PAP applications for Eliquis and Farxiga  in response to provider referral for pharmacy case management and/or care coordination services.   Consent to Services:  The patient was given information about Chronic Care Management services, agreed to services, and gave verbal consent prior to initiation of services.  Please see initial visit note for detailed documentation.   Patient Care Team: Colon Branch, MD as PCP - General Bensimhon, Shaune Pascal, MD as Consulting Physician (Cardiology) Memory Argue, MD as Referring Physician [name], MD as Consulting Physician (Cardiology) Cherre Robins, PharmD (Pharmacist)  Recent office visits: 06/18/2021 - PCP (Dr Larose Kells) Annual Physical. Labs checked. No med changes.  03/14/2021 - Lab - TSH elevated - Levothyroxine increased to 30mg daily 01/17/2021 - Lab - TSH still elevated at 8.55. Levothyroxine increased to 50 mcg daily   Recent consult visits: 04/25/2021 - Cardio (Dr BHaroldine Laws f/u CHF, CAD and afib. No med changes.   01/10/2021 - Rad Onc (Dr PMalva Limes 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. F/u 6 months with flexible nasolaryngoscopy or sooner should symptoms warrant. He will continue fluoride trays nightly and see his dentist biannually.   12/25/2020 - Cardio (Dr BHaroldine Laws. F/U CHF. Started Entresto 24/292mtwice daily. Stopped candesartan 46m85m (patient later stopped Entresto and restarted candesartan due to intolerance to [facility]: None in previous 6 months  Objective:  Lab Results   Component Value Date   CREATININE 1.10 04/25/2021   CREATININE 0.99 12/25/2020   CREATININE 0.94 10/11/2020    Lab Results  Component Value Date   HGBA1C 6.0 06/18/2021   Last diabetic Eye exam: No results found for: HMDIABEYEEXA  Last diabetic Foot exam: No results found for: HMDIABFOOTEX      Component Value Date/Time   CHOL 147 04/25/2021 1133   TRIG 55 04/25/2021 1133   TRIG 45 12/16/2006 0912   HDL 77 04/25/2021 1133   CHOLHDL 1.9 04/25/2021 1133   VLDL 11 04/25/2021 1133   LDLCALC 59 04/25/2021 1133    Hepatic Function Latest Ref Rng & Units 04/25/2021 09/13/2020 06/14/2020  Total Protein 6.5 - 8.1 g/dL 6.5 6.7 -  Albumin 3.5 - 5.0 g/dL 3.9 3.9 -  AST 15 - 41 U/L 34 25 21  ALT 0 - 44 U/L _0 Alk Phosphatase 38 - 126 U/L 55 63 -  Total Bilirubin 0.3 - 1.2 mg/dL 1.9(H) 2.3(H) -  Bilirubin, Direct 0.0 - 0.3 mg/dL - - -    Lab Results  Component Value Date/Time   TSH 2.08 04/24/2021 07:05 AM   TSH 5.87 (H) 03/14/2021 07:09 AM   FREET4 0.91 06/14/2020 08:36 AM   FREET4 1.28 06/03/2018 10:43 AM    CBC Latest Ref Rng & Units 04/25/2021 12/25/2020 10/11/2020  WBC 4.0 - 10.5 K/uL 5.9 6.3 6.9  Hemoglobin 13.0 - 17.0 g/dL 14.7 14.9 14.8  Hematocrit 39.0 - 52.0 % 45.8 47.1 45.5  Platelets 150 - 400 K/uL 201 204 193    No results found for: VD25OH  Clinical ASCVD: Yes  The ASCVD Risk score Mikey Bussing DC Jr., et al., 2013) failed to calculate for the following reasons:   The patient has a prior MI or stroke diagnosis    Other: CHADSVAsc = 5  Social History   Tobacco Use  Smoking Status Former   Packs/day: 0.10   Types: Cigarettes   Quit date: 12/16/1999   Years since quitting: 21.5  Smokeless Tobacco Never   BP Readings from Last 3 Encounters:  06/18/21 108/60  04/25/21 102/60  12/25/20 120/70   Pulse Readings from Last 3 Encounters:  06/18/21 63  04/25/21 (!) 49  12/25/20 (!) 49   Wt Readings from Last 3 Encounters:  06/18/21 167 lb 6 oz (75.9 kg)   04/25/21 165 lb 3.2 oz (74.9 kg)  12/25/20 167 lb 12.8 oz (76.1 kg)    Assessment: Review of patient past medical history, allergies, medications, health status, including review of consultants reports, laboratory and other test data, was performed as part of comprehensive evaluation and provision of chronic care management services.   SDOH:  (Social Determinants of Health) assessments and interventions performed:     CCM Care Plan  Allergies  Allergen Reactions   Atorvastatin      myalgia, Muscle aching   Entresto [Sacubitril-Valsartan] Itching    Had itching and redness   Sulfonamide Derivatives     Rash and hot flashes   Tape Other (See Comments)    Rips skin- use PAPER tape   Sulfamethoxazole Rash and Other (See Comments)    Hot flashes    Medications Reviewed Today     Reviewed by Cherre Robins, PharmD (Pharmacist) on 06/27/21 at [facility] List Status: <None>   Medication Order Taking? Sig Documenting Provider Last Dose Status Informant  acetaminophen (TYLENOL) 325 MG tablet [id number] No Take 650 mg by mouth every 6 (six) hours as needed (for pain.). [provider] Taking Active Self           Med Note Isac Caddy, Octavia Heir   Wed Oct 12, 2019  2:03 PM)    amiodarone (PACERONE) 200 MG tablet [id number] No TAKE 1 TABLET BY MOUTH EVERY DAY Bensimhon, Shaune Pascal, MD Taking Active   apixaban (ELIQUIS) 5 MG TABS tablet [id number] No Take 1 tablet (5 mg total) by mouth 2 (two) times daily. Patsey Berthold, NP Taking Active Self  candesartan (ATACAND) 8 MG tablet [id number] No Take 0.5 tablets (4 mg total) by mouth 2 (two) times daily. Bensimhon, Shaune Pascal, MD Taking Active   carboxymethylcellul-glycerin (OPTIVE) 0.5-0.9 % ophthalmic solution [id number] No Place 1 drop into both eyes daily. [provider] Taking Active Self  cetirizine (ZYRTEC) 10 MG tablet [id number] No Take 10 mg by mouth as needed for allergies. [provider] Taking Active Self   Colchicine (MITIGARE) 0.6 MG CAPS [id number] No Take 0.6 mg by mouth 2 (two) times daily as needed (gout flare).  Patient not taking: Reported on 06/18/2021   Colon Branch, MD Not Taking Active Self           Med Note Alinda Money, Cheri Rous   Tue Jun 18, 2021  7:44 AM) PRN  FARXIGA 10 MG TABS tablet [id number] No TAKE 1 TABLET BY MOUTH EVERY DAY BEFORE BREAKFAST Bensimhon, Shaune Pascal, MD Taking Active   furosemide (LASIX) 40 MG tablet [id number] No TAKE 1 TABLET EVERY DAY Bensimhon, Shaune Pascal, MD Taking Active   levothyroxine (SYNTHROID) 88 MCG tablet [id number] No Take 1 tablet (88 mcg total) by mouth daily before  breakfast. Colon Branch, MD Taking Active   nitroGLYCERIN (NITROSTAT) 0.4 MG SL tablet [id number] No Place 1 tablet (0.4 mg total) under the tongue every 5 (five) minutes x 3 doses as needed for chest pain.  Patient not taking: Reported on 06/18/2021   Colon Branch, MD Not Taking Active Self           Med Note [facility], Cheri Rous   Tue Jun 18, 2021  7:43 AM) PRN  Omega-3 Fatty Acids (FISH OIL) 1200 MG CAPS [id number] No Take 3,600 mg by mouth daily. [provider] Taking Active Self  rosuvastatin (CRESTOR) 40 MG tablet [id number] No Take 1 tablet (40 mg total) by mouth at bedtime. Colon Branch, MD Taking Active   spironolactone (ALDACTONE) 25 MG tablet [id number] No TAKE 1/2 TABLET BY MOUTH EVERY DAY Bensimhon, Shaune Pascal, MD Taking Active             Patient Active Problem List   Diagnosis Date Noted   Heme + stool    Internal and external prolapsed hemorrhoids    Difficulty swallowing 01/21/2018   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 [facility]) 12/24/2017   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 ([place]) 11/12/2017   PCP NOTES >>>>>>>>>>>>>>>>>>>>> 04/21/2016   Seborrheic dermatitis of scalp 04/11/2013   Annual physical exam 04/11/2011   MITRAL REGURGITATION 75/91/6384   SYSTOLIC HEART FAILURE, CHRONIC 10/03/2009    Hyperglycemia 06/19/2008   Coronary atherosclerosis 06/14/2008   Automatic implantable cardioverter-defibrillator in situ 06/01/2008   ERECTILE DYSFUNCTION 12/17/2007   Hyperlipidemia 02/06/2007   Gout 02/06/2007   RBBB 02/06/2007   Congestive heart failure ([place]) 02/06/2007   CORONARY ARTERY BYPASS GRAFT, FOUR VESSEL, HX OF 02/12/2005    Immunization History  Administered Date(s) Administered   Fluad Quad(high Dose 65+) 10/25/2020   Influenza Split 10/10/2011, 10/11/2012   Influenza Whole 11/18/2007, 09/27/2008, 09/27/2009, 08/20/2010   Influenza, High Dose Seasonal PF 11/25/2016, 10/22/2017, 10/21/2018, 10/19/2019   Influenza,inj,Quad PF,6+ Mos 10/13/2013, 10/24/2014, 11/20/2015   PFIZER(Purple Top)SARS-COV-2 Vaccination 02/03/2020, 02/22/2020, 09/18/2020   Pneumococcal Conjugate-13 04/13/2014   Pneumococcal Polysaccharide-23 03/07/2009, 11/20/2015   Td 12/15/2006, 04/27/2017   Zoster Recombinat (Shingrix) 04/30/2018, 07/02/2018   Zoster, Live 04/11/2013    Conditions to be addressed/monitored: CHF, CAD, HTN, HLD and Thyroid disease, gout, metastatic throat cancer; pre diabetes  Care Plan : General Pharmacy (Adult)  Updates made by Cherre Robins, PHARMD since 06/27/2021 12:00 AM     Problem: Medication Adherence (Wellness)      Long-Range Goal: Medication Adherence Maintained   Start Date: 03/14/2021  Recent Progress: On track  Priority: High  Note:   Current Barriers:  Suboptimal therapeutic regimen for thyroid disease Usually enters coverage gap in June or July Cost of Eliquis and [place] has increased with last fills - in coverage gap  Pharmacist Clinical Goal(s):  Over the next 180 days, patient will verbalize ability to afford treatment regimen adhere to plan to optimize therapeutic regimen for thyroid disease, CHF, HTN and lipids as evidenced by report of adherence to recommended medication management changes through collaboration with PharmD and provider.   Working with clinical pharmacist to apply for patient assistance for Farxiga and Eliquis  Interventions: 1:1 collaboration with Colon Branch, MD regarding development and update of comprehensive plan of care as evidenced by provider attestation and co-signature Inter-disciplinary care team collaboration (see longitudinal plan of care) Comprehensive medication review performed; medication list updated in electronic medical  record  Pre - Diabetes: Controlled; current treatment: diet and exercise (he is taking Farxiga 52m daily but this is mostly for CHF) Current glucose readings: does not monitor BG at home Current exercise: frequent golfing and some walking Not addressed at this visit but patient is to continue previously addressed recommendations  Hypertension / CHF: Controlled;  current treatment:  candasartan 833m- take 0.5 tablet = 83m52mwice a day furosemide 80m883mily spironolactone 25mg69make 0.5 tablet daily Farxiga 10mg 3my  Current home readings: 120's  / 70's Unable to tolerate Entresto - caused itching and redness Interventions:  Reviewed importance of weighing daily and when to notify provider of weight increases.  Recommended continue current therapy Provided applications for FarxigPurcellent assistance at last CCM visit; patient to complete his portion and return to clinical pharmacist.  Patient has returned application for FarxigIraniewed and updated missing information; Dr Paz reLarose Kellswed and signed. Faxed to [facility] PAP.   Hyperlipidemia: Controlled Current treatment: rosuvastatin 80mg d43m  Medications previously tried: atorvastatin   Recommended continue current therapy   Atrial Fibrillation / VT: Controlled Current rate/rhythm control - amiodarone 200mg da683mAnticoagulant treatment: Eliquis 5mg twic21maily CHADS2VASc score: 4 Interventions:  Recommended continue current therapy Patient is now in Medicare Coverage gap; mailed application for  Eliquis PAP; he will complete patient portion and return to clinical pharmacist; along with report with 2022 OOP expense for medicaitons. Patient has returned application for Eliquis - reviewed and updated missing information; Needed report from [facility] regarding patient's 2022 OOP medication expense. Requested and [facility] is [facility] Dr Paz revieLarose Kells and signed ELiquis application.   Patient Goals/Self-Care Activities Over the next 180 days, patient will:  take medications as prescribed and collaborate with provider on medication access solutions  Follow Up Plan: Telephone follow up appointment with care management team member scheduled for:  CMA will check in regarding PAP applications in 2 to 3 weeks; Clinical pharmacist will follow up in 2 months as planned unless needed sooner.         Medication Assistance: Application for Farxiga and Eliquis  medication assistance program. in process.  Anticipated assistance start date 07/14/2021.  See plan of care for additional detail. Applications reviewed and pharmacy OOP cost report requested from [facility] for Eliquis application. Both applications updated with missing information, reviewed and signed by Dr Paz.  BotLarose Kellspplications were faxed to PAP today. I am a little concerned that based on 2021 income report that patient might not qualify for BMS patient assistance for Eliquis but I did submit anyway.   Patient's [facility] [address] [place] [phone]:  Patient agrees to Care Plan and Follow-up.  Plan: Telephone follow up appointment with care management team member scheduled for:  September 2022 . Pharmacy team CMA will check back with patient in 2 to 3 weeks regarding approval for PAP.  Curstin Schmale EckCherre RobinsClinical Pharmacist [facility]erCommunity [facility]

## 2021-06-27 NOTE — Patient Instructions (Signed)
Visit Information  PATIENT GOALS:  Goals Addressed             This Visit's Progress    Chronic Care Management Pharmacy Care Plan   On track    CARE PLAN ENTRY (see longitudinal plan of care for additional care plan information)  Current Barriers:  Chronic Disease Management support, education, and care coordination needs related to Pre-DM, HF, HTN, HLD, CAD, Gout, Pain, Dry Eye, Allergies   Hypertension Screening BP Readings from Last 3 Encounters:  06/18/21 108/60  04/25/21 102/60  12/25/20 120/70  Pharmacist Clinical Goal(s): Over the next 180 days, patient will work with PharmD and providers to maintain BP goal <130/80 Current regimen:  Candesartan 8mg  - take 0.5 tablet = 4mg  twice a day  Spironolactone 25mg  - take 0.5 tablet daily Patient self care activities - Over the next 180 days, patient will: Maintain hypertension medication regimen. Continue to check blood pressure and heart rate every other day  Irregular Heart Rate: Pharmacist Clinical Goal(s): Over the next 180 days, patient will work with PharmD and providers to maintain heart rate in normal sinus rhythm and prevent stroke; Also will work with patient to apply for assistance with Eliquis cost Current regimen:  Eliquis 5mg  take 1 tablet twice a day Amiodarone 200mg  daily Patient self care activities - Over the next 180 days, patient will: Maintain amiodarone for heart rate / rhythm and continue to take Eliquis for stroke prevention  Complete application for medication assistance for Eliquis and return to clinical pharmacist at Dr Ethel Rana office. (Done)  Hyperlipidemia Lab Results  Component Value Date/Time   LDLCALC 59 04/25/2021 11:33 AM  Pharmacist Clinical Goal(s): Over the next 180 days, patient will work with PharmD and providers to maintain LDL goal < 70 Current regimen:  Rosuvastatin 40mg  daily Patient self care activities - Over the next 180 days, patient will: Maintain cholesterol medication  regimen.   Pre-Diabetes Lab Results  Component Value Date/Time   HGBA1C 6.0 06/18/2021 08:33 AM   HGBA1C 6.2 06/14/2020 08:36 AM  Pharmacist Clinical Goal(s): Over the next 180 days, patient will work with PharmD and providers to maintain A1c goal <6.5% Current regimen:  [place] 10mg  daily (primarily for heart failure diagnosis) Interventions: Discussed diet and exercise Discussed patient assistance options now that patient is in Medicare coverage gap Patient self care activities - Over the next 180 days, patient will: Maintain a1c <6.5% Complete [place] and [place] application and return to office. (Done by patient 7/5/20220)   Heart Failure Pharmacist Clinical Goal(s) Over the next 180 days, patient will work with PharmD and providers to reduce symptoms of heart failure Current regimen:  Candesartan 8mg  - take 0.5 tablet = 4mg  twice a day  Farxiga 10mg  daily Furosemide 40mg  daily Spironolactone 25mg  - take 0.5 tablet daily  Interventions: Reviewed importance of daily weights and when to report weight gain  More than 3lbs in 1 day  More than 5lbs in 1 week  Contact cardio, primary care, or pharmacy team Patient self care activities - Over the next 180 days, patient will: Weigh daily Maintain heart failure medication regimen Complete application for [place] patient assistance and return to clinical pharmacist. (Done by patient 7/5/20220)   Medication management Pharmacist Clinical Goal(s): Over the next 180 days, patient will work with PharmD and providers to maintain optimal medication adherence Current pharmacy: [facility] Comprehensive medication review performed. Continue current medication management strategy Discussed options for assistance when he reaches Medicare coverage gap.  Patient self care  activities - Over the next 180 days, patient will: Focus on medication adherence by filling and taking medications appropriately Take medications as prescribed Report any  questions or concerns to PharmD and/or provider(s)   Please see past updates related to this goal by clicking on the "Past Updates" button in the selected goal         Patient verbalizes understanding of instructions provided today and agrees to view in [place].   Telephone follow up appointment with care management team member scheduled for: 2 to 4 weeks - follow up by phone to check PAP applications  Cherre Robins, PharmD Clinical Pharmacist [place] [facility]

## 2021-07-02 ENCOUNTER — Ambulatory Visit (INDEPENDENT_AMBULATORY_CARE_PROVIDER_SITE_OTHER): Payer: Medicare HMO | Admitting: Internal Medicine

## 2021-07-02 ENCOUNTER — Other Ambulatory Visit: Payer: Self-pay

## 2021-07-02 ENCOUNTER — Encounter: Payer: Self-pay | Admitting: Internal Medicine

## 2021-07-02 VITALS — BP 102/50 | HR 50 | Ht 69.0 in | Wt 166.0 lb

## 2021-07-02 DIAGNOSIS — I472 Ventricular tachycardia, unspecified: Secondary | ICD-10-CM | POA: Insufficient documentation

## 2021-07-02 DIAGNOSIS — I5022 Chronic systolic (congestive) heart failure: Secondary | ICD-10-CM | POA: Diagnosis not present

## 2021-07-02 DIAGNOSIS — I480 Paroxysmal atrial fibrillation: Secondary | ICD-10-CM

## 2021-07-02 DIAGNOSIS — Z95810 Presence of automatic (implantable) cardiac defibrillator: Secondary | ICD-10-CM | POA: Diagnosis not present

## 2021-07-02 NOTE — Patient Instructions (Signed)
Medication Instructions:  Your physician recommends that you continue on your current medications as directed. Please refer to the Current Medication list given to you today.  Labwork: None ordered.  Testing/Procedures: None ordered.  Follow-Up: Your physician wants you to follow-up in: one year with Cristopher Peru, MD or one of the following Advanced Practice Providers on your designated Care Team:   Tommye Standard, [place] Legrand Como "Jonni Sanger" Chalmers Cater, [place]  Remote monitoring is used to monitor your ICD from home. This monitoring reduces the number of office visits required to check your device to one time per year. It allows [place] to keep an eye on the functioning of your device to ensure it is working properly. You are scheduled for a device check from home on 07/30/2021. You may send your transmission at any time that day. If you have a wireless device, the transmission will be sent automatically. After your physician reviews your transmission, you will receive a postcard with your next transmission date.  Any Other Special Instructions Will Be Listed Below (If Applicable).  If you need a refill on your cardiac medications before your next appointment, please call your pharmacy.

## 2021-07-02 NOTE — Progress Notes (Signed)
HPI [name] returns today for followup. He is a pleasant 70 yo man with a h/o chronic systolic heart failure, HTN, throat CA, s/p ICD insertion. He has had no recurrent VT.He denies chest pain or sob. When I saw him last, he had lost weight but has gained back 4 lbs. He feels well. He denies any additional ICD therapies or syncope. Allergies  Allergen Reactions   Atorvastatin      myalgia, Muscle aching   Entresto [Sacubitril-Valsartan] Itching    Had itching and redness   Sulfonamide Derivatives     Rash and hot flashes   Tape Other (See Comments)    Rips skin- use PAPER tape   Sulfamethoxazole Rash and Other (See Comments)    Hot flashes     Current Outpatient Medications  Medication Sig Dispense Refill   acetaminophen (TYLENOL) 325 MG tablet Take 650 mg by mouth every 6 (six) hours as needed (for pain.).     amiodarone (PACERONE) 200 MG tablet TAKE 1 TABLET BY MOUTH EVERY DAY 90 tablet 1   apixaban (ELIQUIS) 5 MG TABS tablet Take 1 tablet (5 mg total) by mouth 2 (two) times daily. 60 tablet 11   candesartan (ATACAND) 8 MG tablet Take 0.5 tablets (4 mg total) by mouth 2 (two) times daily. 90 tablet 3   carboxymethylcellul-glycerin (OPTIVE) 0.5-0.9 % ophthalmic solution Place 1 drop into both eyes daily.     cetirizine (ZYRTEC) 10 MG tablet Take 10 mg by mouth as needed for allergies.     Colchicine (MITIGARE) 0.6 MG CAPS Take 0.6 mg by mouth 2 (two) times daily as needed (gout flare). 60 capsule 0   FARXIGA 10 MG TABS tablet TAKE 1 TABLET BY MOUTH EVERY DAY BEFORE BREAKFAST 90 tablet 3   furosemide (LASIX) 40 MG tablet TAKE 1 TABLET EVERY DAY 90 tablet 3   levothyroxine (SYNTHROID) 88 MCG tablet Take 1 tablet (88 mcg total) by mouth daily before breakfast. 90 tablet 1   nitroGLYCERIN (NITROSTAT) 0.4 MG SL tablet Place 1 tablet (0.4 mg total) under the tongue every 5 (five) minutes x 3 doses as needed for chest pain. 25 tablet 3   Omega-3 Fatty Acids (FISH OIL) 1200 MG  CAPS Take 3,600 mg by mouth daily.     rosuvastatin (CRESTOR) 40 MG tablet Take 1 tablet (40 mg total) by mouth at bedtime. 90 tablet 1   spironolactone (ALDACTONE) 25 MG tablet TAKE 1/2 TABLET BY MOUTH EVERY DAY 45 tablet 2   No current facility-administered medications for this visit.     Past Medical History:  Diagnosis Date   AICD (automatic cardioverter/defibrillator) present 6/09   St. Jude   Bradycardia    Use of beta blocker limited   CAD (coronary artery disease)    a- S/p cabg in march 2006 by MatthewOwen;  b. Matthew Joyce 5/12: large IL scar from apex to base, no ischemia, EF 37%   Cancer (HCC)    on right side of neck   ED (erectile dysfunction)    Gout    Hyperlipidemia    Ischemic cardiomyopathy    a-Echocardiogram, Nov 2006, showed ejection fraction of 30-40% with mild to moderated mitral  regurgitation and mild aortic regurgitation b- Cardiac MRI, May 2007, EF of 44% with 50% scar involving  the inferolateral walls. No comment of mitral regurgitation. c- last echo  11/10: EF 30-35%  mod MR d- Nega T-Wave alternans testing in May of 2007 e- no ACE due  to low BP;  f. CPX 3/11: normal   RBBB (right bundle branch block with left anterior fascicular block)    Systolic CHF, chronic (HCC)    Thyroid disease     ROS:   All systems reviewed and negative except as noted in the HPI.   Past Surgical History:  Procedure Laterality Date   CARDIAC DEFIBRILLATOR PLACEMENT  06/01/08   ICD   COLONOSCOPY     COLONOSCOPY WITH PROPOFOL N/A 07/28/2019   Procedure: COLONOSCOPY WITH PROPOFOL;  Surgeon: Matthew Mayer, MD;  Location: [facility];  Service: Endoscopy;  Laterality: N/A;   CORONARY ARTERY BYPASS GRAFT  02-2005   ICD GENERATOR CHANGEOUT N/A 02/03/2017   Procedure: ICD Generator Changeout;  Surgeon: Matthew Lance, MD;  Location: [place] [facility];  Service: Cardiovascular;  Laterality: N/A;   ORIF ANKLE FRACTURE  1987   left    RIGHT/LEFT HEART CATH AND CORONARY/GRAFT  ANGIOGRAPHY N/A 09/20/2019   Procedure: RIGHT/LEFT HEART CATH AND CORONARY/GRAFT ANGIOGRAPHY;  Surgeon: Matthew Artist, MD;  Location: [place] [facility];  Service: Cardiovascular;  Laterality: N/A;   SHOULDER ARTHROSCOPY  02/2010   rt    TEE WITHOUT CARDIOVERSION N/A 10/24/2019   Procedure: TRANSESOPHAGEAL ECHOCARDIOGRAM (TEE);  Surgeon: Matthew Artist, MD;  Location: [facility] END[facility];  Service: Cardiovascular;  Laterality: N/A;   TONSILLECTOMY     as a child     Family History  Problem Relation Age of Onset   Heart attack Father 69   Cardiomyopathy Brother    Colon cancer Neg Hx    Prostate cancer Neg Hx    Diabetes Neg Hx      Social History   Socioeconomic History   Marital status: Married    Spouse name: Not on file   Number of children: 2   Years of education: Not on file   Highest education level: Not on file  Occupational History   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     Employer: [employer]  Tobacco Use   Smoking status: Former    Packs/day: 0.10    Types: Cigarettes    Quit date: 12/16/1999    Years since quitting: 21.5   Smokeless tobacco: Never  Vaping Use   Vaping Use: Never used  Substance and Sexual Activity   Alcohol use: Yes    Comment: socially   Drug use: No   Sexual activity: Not on file  Other Topics Concern   Not on file  Social History Narrative   Retired    household- pt and wife   2 adult children in [place] also has grandchildren   Former but not current smoker, occasional alcohol no drug use   Social Determinants of Radio broadcast assistant Strain: Medium Risk   Difficulty of Paying Living Expenses: Somewhat hard  Food Insecurity: Not on file  Transportation Needs: Not on file  Physical Activity: Insufficiently Active   Days of Exercise per Week: 2 days   Minutes of Exercise per Session: 60 min  Stress: Not on file  Social Connections: Not on file  Intimate Partner  Violence: Not on file     BP (!) 102/50   Pulse (!) 50   Ht 5\' 9"  (1.753 m)   Wt 166 lb (75.3 kg)   SpO2 96%   BMI 24.51 kg/m   Physical Exam:  Well appearing NAD HEENT: Unremarkable Neck:  No JVD, no thyromegally Lymphatics:  No adenopathy Back:  No CVA tenderness Lungs:  Clear with no wheezes HEART:  Regular rate rhythm, no murmurs, no rubs, no clicks Abd:  soft, positive bowel sounds, no organomegally, no rebound, no guarding Ext:  2 plus pulses, no edema, no cyanosis, no clubbing Skin:  No rashes no nodules Neuro:  CN II through XII intact, motor grossly intact  EKG - SB with RBBB  DEVICE  Normal device function.  See PaceArt for details.   Assess/Plan:  1. VT - he has done well on low dose amiodarone. He will continue. 2. PAF - he is maintaining NSR very nicely and will continue amio. 3. Chronic systolic heart failure - his symptoms remain class 2. No change in his meds. 4. ICD -his St. Jude single chamber ICD is working normally. We will recheck in several months  Royston Sinner Diantha Paxson,MD

## 2021-07-18 DIAGNOSIS — C801 Malignant (primary) neoplasm, unspecified: Secondary | ICD-10-CM | POA: Diagnosis not present

## 2021-07-18 DIAGNOSIS — C770 Secondary and unspecified malignant neoplasm of lymph nodes of head, face and neck: Secondary | ICD-10-CM | POA: Diagnosis not present

## 2021-07-30 ENCOUNTER — Ambulatory Visit (INDEPENDENT_AMBULATORY_CARE_PROVIDER_SITE_OTHER): Payer: Medicare HMO

## 2021-07-30 DIAGNOSIS — I428 Other cardiomyopathies: Secondary | ICD-10-CM

## 2021-07-30 DIAGNOSIS — I429 Cardiomyopathy, unspecified: Secondary | ICD-10-CM

## 2021-07-30 LAB — CUP PACEART REMOTE DEVICE CHECK
Battery Remaining Longevity: 60 mo
Battery Remaining Percentage: 64 %
Battery Voltage: 2.96 V
Brady Statistic RV Percent Paced: 1 %
Date Time Interrogation Session: 20220816040016
HighPow Impedance: 52 Ohm
HighPow Impedance: 52 Ohm
Implantable Lead Implant Date: 20090618
Implantable Lead Location: 753860
Implantable Lead Model: 7121
Implantable Pulse Generator Implant Date: 20180220
Lead Channel Impedance Value: 390 Ohm
Lead Channel Pacing Threshold Amplitude: 1.75 V
Lead Channel Pacing Threshold Pulse Width: 0.6 ms
Lead Channel Sensing Intrinsic Amplitude: 2.9 mV
Lead Channel Setting Pacing Amplitude: 3.5 V
Lead Channel Setting Pacing Pulse Width: 0.6 ms
Lead Channel Setting Sensing Sensitivity: 0.5 mV
Pulse Gen Serial Number: 7406967

## 2021-08-03 ENCOUNTER — Other Ambulatory Visit (HOSPITAL_COMMUNITY): Payer: Self-pay | Admitting: Internal Medicine

## 2021-08-18 NOTE — Progress Notes (Signed)
Remote ICD transmission.   

## 2021-08-21 ENCOUNTER — Telehealth: Payer: Medicare HMO

## 2021-08-22 ENCOUNTER — Ambulatory Visit (INDEPENDENT_AMBULATORY_CARE_PROVIDER_SITE_OTHER): Payer: Medicare HMO | Admitting: Pharmacist

## 2021-08-22 DIAGNOSIS — E785 Hyperlipidemia, unspecified: Secondary | ICD-10-CM

## 2021-08-22 DIAGNOSIS — I25810 Atherosclerosis of coronary artery bypass graft(s) without angina pectoris: Secondary | ICD-10-CM

## 2021-08-22 DIAGNOSIS — I509 Heart failure, unspecified: Secondary | ICD-10-CM

## 2021-08-22 NOTE — Patient Instructions (Signed)
Visit Information  PATIENT GOALS:  Goals Addressed             This Visit's Progress    Chronic Care Management Pharmacy Care Plan   On track    CARE PLAN ENTRY (see longitudinal plan of care for additional care plan information)  Current Barriers:  Chronic Disease Management support, education, and care coordination needs related to Pre-DM, HF, HTN, HLD, CAD, Gout, Pain, Dry Eye, Allergies   Hypertension Screening BP Readings from Last 3 Encounters:  07/02/21 (!) 102/50  06/18/21 108/60  04/25/21 102/60  Pharmacist Clinical Goal(s): Over the next 180 days, patient will work with PharmD and providers to maintain BP goal <130/80 Current regimen:  Candesartan '8mg'$  - take 0.5 tablet = '4mg'$  twice a day  Spironolactone '25mg'$  - take 0.5 tablet daily Patient self care activities - Over the next 180 days, patient will: Maintain hypertension medication regimen. Continue to check blood pressure and heart rate every other day  Irregular Heart Rate: Pharmacist Clinical Goal(s): Over the next 180 days, patient will work with PharmD and providers to maintain heart rate in normal sinus rhythm and prevent stroke; Also will work with patient to apply for assistance with Eliquis cost Current regimen:  Eliquis '5mg'$  take 1 tablet twice a day Amiodarone '200mg'$  daily Patient self care activities - Over the next 180 days, patient will: Maintain amiodarone for heart rate / rhythm and continue to take Eliquis for stroke prevention  Report any signs of bleeding  Hyperlipidemia Lab Results  Component Value Date/Time   [facility] [place] 59 04/25/2021 11:33 AM  Pharmacist Clinical Goal(s): Over the next 180 days, patient will work with PharmD and providers to maintain LDL goal < 70 Current regimen:  Rosuvastatin '40mg'$  daily Patient self care activities - Over the next 180 days, patient will: Maintain cholesterol medication regimen.   Pre-Diabetes Lab Results  Component Value Date/Time   HGBA1C 6.0  06/18/2021 08:33 AM   HGBA1C 6.2 06/14/2020 08:36 AM  Pharmacist Clinical Goal(s): Over the next 180 days, patient will work with PharmD and providers to maintain A1c goal <6.5% Current regimen:  Farxiga '10mg'$  daily (primarily for heart failure diagnosis) Interventions: Discussed diet and exercise Discussed patient assistance options now that patient is in Medicare coverage gap Patient self care activities - Over the next 180 days, patient will: Maintain a1c <6.5%   Heart Failure Pharmacist Clinical Goal(s) Over the next 180 days, patient will work with PharmD and providers to reduce symptoms of heart failure Current regimen:  Candesartan '8mg'$  - take 0.5 tablet = '4mg'$  twice a day  Farxiga '10mg'$  daily Furosemide '40mg'$  daily Spironolactone '25mg'$  - take 0.5 tablet daily  Interventions: Reviewed importance of daily weights and when to report weight gain  More than 3lbs in 1 day  More than 5lbs in 1 week  Contact cardio, primary care, or pharmacy team Patient self care activities - Over the next 180 days, patient will: Weigh daily Maintain heart failure medication regimen Reorder Farxiga  Medication management Pharmacist Clinical Goal(s): Over the next 180 days, patient will work with PharmD and providers to maintain optimal medication adherence [facility] Comprehensive medication review performed. Continue current medication management strategy Discussed options for assistance when he reaches Medicare coverage gap.  Patient self care activities - Over the next 180 days, patient will: Focus on medication adherence by filling and taking medications appropriately Take medications as prescribed Report any questions or concerns to PharmD and/or provider(s)   Please see past updates  related to this goal by clicking on the "Past Updates" button in the selected goal         The patient verbalized understanding of instructions, educational materials, and care  plan provided today and declined offer to receive copy of patient instructions, educational materials, and care plan.   Telephone follow up appointment with care management team member scheduled for: 3 months  Cherre Robins, PharmD Clinical Pharmacist [place] [facility]

## 2021-08-22 NOTE — Chronic Care Management (AMB) (Signed)
Chronic Care Management Pharmacy Note  08/22/2021 Name:  Matthew RODARTE Sr. MRN:  [id number] DOB:  1951/08/28  Subjective: Matthew Romans Sr. is an 70 y.o. year old male who is a primary patient of Paz, Alda Berthold, MD.  The CCM team was consulted for assistance with disease management and care coordination needs.    Engaged with patient by telephone for follow up visit in response to provider referral for pharmacy case management and/or care coordination services.   Consent to Services:  The patient was given information about Chronic Care Management services, agreed to services, and gave verbal consent prior to initiation of services.  Please see initial visit note for detailed documentation.   Patient Care Team: Colon Branch, MD as PCP - General Bensimhon, Shaune Pascal, MD as Consulting Physician (Cardiology) Memory Argue, MD as Referring Physician [name], MD as Consulting Physician (Cardiology) Cherre Robins, PharmD (Pharmacist)  Recent office visits: 06/18/2021 - PCP (Dr Larose Kells) Annual Physical. Labs checked. No med changes.  03/14/2021 - Lab - TSH elevated - Levothyroxine increased to 18mg daily  Recent consult visits: 04/20/18/2022 - Cardio (Dr TLovena Le F/U systolic CHF and Afib. No med changes.  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 Continue fluoride trays nightly and see his dentist biannually.  01/2021 - Cardio (Dr BHaroldine Laws f/u CHF, CAD and afib. No med changes.    Hospital visits: None in previous 6 months  Objective:  Lab Results  Component Value Date   CREATININE 1.10 04/25/2021   CREATININE 0.99 12/25/2020   CREATININE 0.94 10/11/2020    Lab Results  Component Value Date   HGBA1C 6.0 06/18/2021   Last diabetic Eye exam: No  results found for: HMDIABEYEEXA  Last diabetic Foot exam: No results found for: HMDIABFOOTEX      Component Value Date/Time   CHOL 147 04/25/2021 1133   TRIG 55 04/25/2021 1133   TRIG 45 12/16/2006 0912   HDL 77 04/25/2021 1133   CHOLHDL 1.9 04/25/2021 1133   VLDL 11 04/25/2021 1133   LDLCALC 59 04/25/2021 1133    Hepatic Function Latest Ref Rng & Units 04/25/2021 09/13/2020 06/14/2020  Total Protein 6.5 - 8.1 g/dL 6.5 6.7 -  Albumin 3.5 - 5.0 g/dL 3.9 3.9 -  AST 15 - 41 U/L 34 25 21  ALT 0 - 44 U/L '27 29 18  ' Alk Phosphatase 38 - 126 U/L 55 63 -  Total Bilirubin 0.3 - 1.2 mg/dL 1.9(H) 2.3(H) -  Bilirubin, Direct 0.0 - 0.3 mg/dL - - -    Lab Results  Component Value Date/Time   TSH 2.08 04/24/2021 07:05 AM   TSH 5.87 (H) 03/14/2021 07:09 AM   FREET4 0.91 06/14/2020 08:36 AM   FREET4 1.28 06/03/2018 10:43 AM    CBC Latest Ref Rng & Units 04/25/2021 12/25/2020 10/11/2020  WBC 4.0 - 10.5 K/uL 5.9 6.3 6.9  Hemoglobin 13.0 - 17.0 g/dL 14.7 14.9 14.8  Hematocrit 39.0 - 52.0 % 45.8 47.1 45.5  Platelets 150 - 400 K/uL 201 204 193    No results found for: VD25OH  Clinical ASCVD: Yes  The ASCVD Risk score (Arnett DK, et al., 2019) failed to calculate for the following reasons:   The patient has a prior MI or stroke diagnosis  Other: CHADSVAsc = 5  Social History   Tobacco Use  Smoking Status Former   Packs/day: 0.10   Types: Cigarettes   Quit date: 12/16/1999   Years since quitting: 21.6  Smokeless Tobacco Never   BP Readings from Last 3 Encounters:  07/02/21 (!) 102/50  06/18/21 108/60  04/25/21 102/60   Pulse Readings from Last 3 Encounters:  07/02/21 (!) 50  06/18/21 63  04/25/21 (!) 49   Wt Readings from Last 3 Encounters:  07/02/21 166 lb (75.3 kg)  06/18/21 167 lb 6 oz (75.9 kg)  04/25/21 165 lb 3.2 oz (74.9 kg)    Assessment: Review of patient past medical history, allergies, medications, health status, including review of consultants reports, laboratory  and other test data, was performed as part of comprehensive evaluation and provision of chronic care management services.   SDOH:  (Social Determinants of Health) assessments and interventions performed:     CCM Care Plan  Allergies  Allergen Reactions   Atorvastatin      myalgia, Muscle aching   Entresto [Sacubitril-Valsartan] Itching    Had itching and redness   Sulfonamide Derivatives     Rash and hot flashes   Tape Other (See Comments)    Rips skin- use PAPER tape   Sulfamethoxazole Rash and Other (See Comments)    Hot flashes    Medications Reviewed Today     Reviewed by Cherre Robins, PharmD (Pharmacist) on 08/22/21 at 58  Med List Status: <None>   Medication Order Taking? Sig Documenting Provider Last Dose Status Informant  acetaminophen (TYLENOL) 325 MG tablet [id number] Yes Take 650 mg by mouth every 6 (six) hours as needed (for pain.). [provider] Taking Active Self           Med Note Isac Caddy, Octavia Heir   Wed Oct 12, 2019  2:03 PM)    amiodarone (PACERONE) 200 MG tablet [id number] Yes TAKE 1 TABLET BY MOUTH EVERY DAY Bensimhon, Shaune Pascal, MD Taking Active   apixaban (ELIQUIS) 5 MG TABS tablet [id number] Yes Take 1 tablet (5 mg total) by mouth 2 (two) times daily. Patsey Berthold, NP Taking Active Self  candesartan (ATACAND) 8 MG tablet [id number] Yes Take 0.5 tablets (4 mg total) by mouth 2 (two) times daily. Bensimhon, Shaune Pascal, MD Taking Active   carboxymethylcellul-glycerin (OPTIVE) 0.5-0.9 % ophthalmic solution [id number] Yes Place 1 drop into both eyes daily. [provider] Taking Active Self  cetirizine (ZYRTEC) 10 MG tablet [id number] Yes Take 10 mg by mouth as needed for allergies. [provider] Taking Active Self  Colchicine (MITIGARE) 0.6 MG CAPS [id number] Yes Take 0.6 mg by mouth 2 (two) times daily as needed (gout flare). Colon Branch, MD Taking Active            Med Note Rose Phi   Tue Jul 02, 2021  2:32 PM)    FARXIGA 10  MG TABS tablet [id number] Yes TAKE 1 TABLET BY MOUTH EVERY DAY BEFORE BREAKFAST Bensimhon, Shaune Pascal, MD Taking Active   furosemide (LASIX) 40 MG tablet [id number] Yes TAKE 1 TABLET EVERY DAY Bensimhon, Shaune Pascal, MD Taking Active   levothyroxine (SYNTHROID) 88 MCG tablet [id number] Yes Take 1 tablet (88 mcg total) by mouth daily before breakfast. Colon Branch, MD Taking Active   nitroGLYCERIN (NITROSTAT) 0.4 MG SL tablet [id number] Yes Place 1 tablet (0.4 mg total) under the tongue every 5 (five) minutes x 3 doses as needed for chest pain. [address], [place] E,  MD Taking Active            Med Note Rose Phi   Tue Jul 02, 2021  2:32 PM)    Omega-3 Fatty Acids (FISH OIL) 1200 MG CAPS [id number] Yes Take 3,600 mg by mouth daily. [provider] Taking Active Self  rosuvastatin (CRESTOR) 40 MG tablet [id number] Yes Take 1 tablet (40 mg total) by mouth at bedtime. 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 ([place]) 11/12/2017   PCP NOTES >>>>>>>>>>>>>>>>>>>>> 04/21/2016   Seborrheic dermatitis of scalp 04/11/2013   Annual physical exam 04/11/2011   MITRAL REGURGITATION 84/69/6295   SYSTOLIC HEART FAILURE, CHRONIC 10/03/2009   Hyperglycemia 06/19/2008   Coronary atherosclerosis 06/14/2008   Automatic implantable cardioverter-defibrillator in situ 06/01/2008   ERECTILE DYSFUNCTION 12/17/2007   Hyperlipidemia 02/06/2007   Gout 02/06/2007   RBBB  02/06/2007   Congestive heart failure ([place]) 02/06/2007   CORONARY ARTERY BYPASS GRAFT, FOUR VESSEL, HX OF 02/12/2005    Immunization History  Administered Date(s) Administered   Fluad Quad(high Dose 65+) 10/25/2020   Influenza Split 10/10/2011, 10/11/2012   Influenza Whole 11/18/2007, 09/27/2008, 09/27/2009, 08/20/2010   Influenza, High Dose Seasonal PF 11/25/2016, 10/22/2017, 10/21/2018, 10/19/2019   Influenza,inj,Quad PF,6+ Mos 10/13/2013, 10/24/2014, 11/20/2015   PFIZER(Purple Top)SARS-COV-2 Vaccination 02/03/2020, 02/22/2020, 09/18/2020   Pneumococcal Conjugate-13 04/13/2014   Pneumococcal Polysaccharide-23 03/07/2009, 11/20/2015   Td 12/15/2006, 04/27/2017   Zoster Recombinat (Shingrix) 04/30/2018, 07/02/2018   Zoster, Live 04/11/2013    Conditions to be addressed/monitored: CHF, CAD, HTN, HLD and Thyroid disease, gout, metastatic throat cancer; pre diabetes  Care Plan : General Pharmacy (Adult)  Updates made by Cherre Robins, PHARMD since 08/22/2021 12:00 AM     Problem: Medication Adherence (Wellness)      Long-Range Goal: Medication Adherence Maintained   Start Date: 03/14/2021  Recent Progress: On track  Priority: High  Note:   Current Barriers:  Suboptimal therapeutic regimen for thyroid disease Usually enters coverage gap in June or July Cost of Eliquis and Wilder Glade has increased with last fills - in Medicare coverage gap  Pharmacist Clinical Goal(s):  Over the next 180 days, patient will verbalize ability to afford treatment regimen adhere to plan to optimize therapeutic regimen for thyroid disease, CHF, HTN and lipids as evidenced by report of adherence to recommended medication management changes through collaboration with PharmD and provider.  Working with clinical pharmacist to apply for patient assistance for Farxiga and Eliquis  Interventions: 1:1 collaboration with Colon Branch, MD regarding development and update of comprehensive plan of care as evidenced  by provider attestation and co-signature Inter-disciplinary care team collaboration (see longitudinal plan of care) Comprehensive medication review performed; medication list updated in electronic medical record  Pre - Diabetes: Controlled; current treatment: diet and exercise (he is taking Farxiga 31m daily but this is mostly for CHF) Current glucose readings: does not monitor BG at home Current exercise: frequent  golfing and some walking Not addressed at this visit but patient is to continue previously addressed recommendations  Hypertension / CHF: Controlled;  current treatment:  candasartan 26m - take 0.5 tablet = 487mtwice a day furosemide 4058maily spironolactone 90m53mtake 0.5 tablet daily Farxiga 10mg21mly (receiving through PAP until 12/14/2021) Current home readings: 120's  / 70's Unable to tolerate Entresto - caused itching and redness Interventions:  Reviewed importance of weighing daily and when to notify provider of weight increases.  Recommended continue current therapy  Reviewed process for requesting refills for Farxiga from PAP.  Hyperlipidemia: Controlled Current treatment: rosuvastatin 40mg 46my  Medications previously tried: atorvastatin   Recommended continue current therapy   Atrial Fibrillation / VT: Controlled Current rate/rhythm control - amiodarone 200mg d18m Anticoagulant treatment: Eliquis 5mg twi44mdaily CHADS2VASc score: 4 Patient in Medicare Coverage gap; applied for Eliquis PAP but due to 2021 income patient was not approved.  Interventions:  Recommended continue current therapy Plan to try for PAP again in 2023 as patient's financial situation if different in 2022 than 2021.   Patient Goals/Self-Care Activities Over the next 180 days, patient will:  take medications as prescribed and collaborate with provider on medication access solutions  Follow Up Plan: Telephone follow up appointment with care management team member scheduled  for:  Clinical pharmacist will follow up in 3 months as planned unless needed sooner.         Medication Assistance:  Far[facility]iga [place]aded [facility] [place] [facility].  Enrollment ends 12/14/2021    Applied for medication assistance for Eliquis but patient was denied (based on 2021 income)   Patient's preferred pharmacy is:  [facility] [address] [phone] Up:  Patient agrees to Care Plan and Follow-up.  Plan: Telephone follow up appointment with care management team member scheduled for:  3 months .   Anali Cabanilla EcCherre Robins Clinical Pharmacist [facility]

## 2021-08-26 ENCOUNTER — Other Ambulatory Visit: Payer: Self-pay

## 2021-08-26 ENCOUNTER — Other Ambulatory Visit (HOSPITAL_COMMUNITY): Payer: Self-pay | Admitting: *Deleted

## 2021-08-26 MED ORDER — APIXABAN 5 MG PO TABS: 5.0000 mg | ORAL_TABLET | Freq: Two times a day (BID) | ORAL | 6 refills | Status: DC

## 2021-08-26 NOTE — Telephone Encounter (Signed)
Pt last saw Dr Lovena Le 07/02/21, last labs 04/25/21 Creat 1.10, age 70, weight 75.3kg, based on specified criteria pt is on appropriate dosage of Eliquis '5mg'$  BID for afib.  Will refill rx.

## 2021-09-13 DIAGNOSIS — E785 Hyperlipidemia, unspecified: Secondary | ICD-10-CM

## 2021-09-13 DIAGNOSIS — I25810 Atherosclerosis of coronary artery bypass graft(s) without angina pectoris: Secondary | ICD-10-CM

## 2021-09-13 DIAGNOSIS — I509 Heart failure, unspecified: Secondary | ICD-10-CM | POA: Diagnosis not present

## 2021-10-10 ENCOUNTER — Ambulatory Visit (INDEPENDENT_AMBULATORY_CARE_PROVIDER_SITE_OTHER): Payer: Medicare HMO

## 2021-10-10 VITALS — Ht 69.0 in | Wt 166.0 lb

## 2021-10-10 DIAGNOSIS — Z0000 Encounter for general adult medical examination without abnormal findings: Secondary | ICD-10-CM | POA: Diagnosis not present

## 2021-10-10 NOTE — Patient Instructions (Signed)
[name] , Thank you for taking time to complete your Medicare Wellness Visit. I appreciate your ongoing commitment to your health goals. Please review the following plan we discussed and let me know if I can assist you in the future.   Screening recommendations/referrals: Colonoscopy: Completed 07/28/2019. No longer required Recommended yearly ophthalmology/optometry visit for glaucoma screening and checkup Recommended yearly dental visit for hygiene and checkup  Vaccinations: Influenza vaccine: Due-May obtain vaccine at our office or your local pharmacy. Pneumococcal vaccine: Up to date Tdap vaccine: Up to date-Due-04/28/2027 Shingles vaccine: Completed vaccines   Covid-19: Booster available at the pharmacy.  Advanced directives: Please bring a copy of Living Will and/or Healthcare Power of Attorney for your chart.   Conditions/risks identified: See problem list  Next appointment: Follow up in one year for your annual wellness visit. 10/16/2022 @ 7:40  Preventive Care 71 Years and Older, Male Preventive care refers to lifestyle choices and visits with your health care provider that can promote health and wellness. What does preventive care include? A yearly physical exam. This is also called an annual well check. Dental exams once or twice a year. Routine eye exams. Ask your health care provider how often you should have your eyes checked. Personal lifestyle choices, including: Daily care of your teeth and gums. Regular physical activity. Eating a healthy diet. Avoiding tobacco and drug use. Limiting alcohol use. Practicing safe sex. Taking low doses of aspirin every day. Taking vitamin and mineral supplements as recommended by your health care provider. What happens during an annual well check? The services and screenings done by your health care provider during your annual well check will depend on your age, overall health, lifestyle risk factors, and family history of  disease. Counseling  Your health care provider may ask you questions about your: Alcohol use. Tobacco use. Drug use. Emotional well-being. Home and relationship well-being. Sexual activity. Eating habits. History of falls. Memory and ability to understand (cognition). Work and work Statistician. Screening  You may have the following tests or measurements: Height, weight, and BMI. Blood pressure. Lipid and cholesterol levels. These may be checked every 5 years, or more frequently if you are over 71 years old. Skin check. Lung cancer screening. You may have this screening every year starting at age 51 if you have a 30-pack-year history of smoking and currently smoke or have quit within the past 15 years. Fecal occult blood test (FOBT) of the stool. You may have this test every year starting at age 79. Flexible sigmoidoscopy or colonoscopy. You may have a sigmoidoscopy every 5 years or a colonoscopy every 10 years starting at age 41. Prostate cancer screening. Recommendations will vary depending on your family history and other risks. Hepatitis C blood test. Hepatitis B blood test. Sexually transmitted disease (STD) testing. Diabetes screening. This is done by checking your blood sugar (glucose) after you have not eaten for a while (fasting). You may have this done every 1-3 years. Abdominal aortic aneurysm (AAA) screening. You may need this if you are a current or former smoker. Osteoporosis. You may be screened starting at age 67 if you are at high risk. Talk with your health care provider about your test results, treatment options, and if necessary, the need for more tests. Vaccines  Your health care provider may recommend certain vaccines, such as: Influenza vaccine. This is recommended every year. Tetanus, diphtheria, and acellular pertussis (Tdap, Td) vaccine. You may need a Td booster every 10 years. Zoster vaccine. You may  need this after age 2. Pneumococcal 13-valent  conjugate (PCV13) vaccine. One dose is recommended after age 48. Pneumococcal polysaccharide (PPSV23) vaccine. One dose is recommended after age 55. Talk to your health care provider about which screenings and vaccines you need and how often you need them. This information is not intended to replace advice given to you by your health care provider. Make sure you discuss any questions you have with your health care provider. Document Released: 12/28/2015 Document Revised: 08/20/2016 Document Reviewed: 10/02/2015 Elsevier Interactive Patient Education  2017 [place] Prevention in the Home Falls can cause injuries. They can happen to people of all ages. There are many things you can do to make your home safe and to help prevent falls. What can I do on the outside of my home? Regularly fix the edges of walkways and driveways and fix any cracks. Remove anything that might make you trip as you walk through a door, such as a raised step or threshold. Trim any bushes or trees on the path to your home. Use bright outdoor lighting. Clear any walking paths of anything that might make someone trip, such as rocks or tools. Regularly check to see if handrails are loose or broken. Make sure that both sides of any steps have handrails. Any raised decks and porches should have guardrails on the edges. Have any leaves, snow, or ice cleared regularly. Use sand or salt on walking paths during winter. Clean up any spills in your garage right away. This includes oil or grease spills. What can I do in the bathroom? Use night lights. Install grab bars by the toilet and in the tub and shower. Do not use towel bars as grab bars. Use non-skid mats or decals in the tub or shower. If you need to sit down in the shower, use a plastic, non-slip stool. Keep the floor dry. Clean up any water that spills on the floor as soon as it happens. Remove soap buildup in the tub or shower regularly. Attach bath mats  securely with double-sided non-slip rug tape. Do not have throw rugs and other things on the floor that can make you trip. What can I do in the bedroom? Use night lights. Make sure that you have a light by your bed that is easy to reach. Do not use any sheets or blankets that are too big for your bed. They should not hang down onto the floor. Have a firm chair that has side arms. You can use this for support while you get dressed. Do not have throw rugs and other things on the floor that can make you trip. What can I do in the kitchen? Clean up any spills right away. Avoid walking on wet floors. Keep items that you use a lot in easy-to-reach places. If you need to reach something above you, use a strong step stool that has a grab bar. Keep electrical cords out of the way. Do not use floor polish or wax that makes floors slippery. If you must use wax, use non-skid floor wax. Do not have throw rugs and other things on the floor that can make you trip. What can I do with my stairs? Do not leave any items on the stairs. Make sure that there are handrails on both sides of the stairs and use them. Fix handrails that are broken or loose. Make sure that handrails are as long as the stairways. Check any carpeting to make sure that it is firmly attached  to the stairs. Fix any carpet that is loose or worn. Avoid having throw rugs at the top or bottom of the stairs. If you do have throw rugs, attach them to the floor with carpet tape. Make sure that you have a light switch at the top of the stairs and the bottom of the stairs. If you do not have them, ask someone to add them for you. What else can I do to help prevent falls? Wear shoes that: Do not have high heels. Have rubber bottoms. Are comfortable and fit you well. Are closed at the toe. Do not wear sandals. If you use a stepladder: Make sure that it is fully opened. Do not climb a closed stepladder. Make sure that both sides of the stepladder  are locked into place. Ask someone to hold it for you, if possible. Clearly mark and make sure that you can see: Any grab bars or handrails. First and last steps. Where the edge of each step is. Use tools that help you move around (mobility aids) if they are needed. These include: Canes. Walkers. Scooters. Crutches. Turn on the lights when you go into a dark area. Replace any light bulbs as soon as they burn out. Set up your furniture so you have a clear path. Avoid moving your furniture around. If any of your floors are uneven, fix them. If there are any pets around you, be aware of where they are. Review your medicines with your doctor. Some medicines can make you feel dizzy. This can increase your chance of falling. Ask your doctor what other things that you can do to help prevent falls. This information is not intended to replace advice given to you by your health care provider. Make sure you discuss any questions you have with your health care provider. Document Released: 09/27/2009 Document Revised: 05/08/2016 Document Reviewed: 01/05/2015 Elsevier Interactive Patient Education  2017 [employer].

## 2021-10-10 NOTE — Progress Notes (Signed)
Subjective:   Matthew Romans Sr. is a 70 y.o. male who presents for Medicare Annual/Subsequent preventive examination.   I connected with Efrain today by telephone and verified that I am speaking with the correct person using two identifiers. Location patient: home Location provider: work Persons participating in the virtual visit: patient, Marine scientist.    I discussed the limitations, risks, security and privacy concerns of performing an evaluation and management service by telephone and the availability of in person appointments. I also discussed with the patient that there may be a patient responsible charge related to this service. The patient expressed understanding and verbally consented to this telephonic visit.    Interactive audio and video telecommunications were attempted between this provider and patient, however failed, due to patient having technical difficulties OR patient did not have access to video capability.  We continued and completed visit with audio only.  Some vital signs may be absent or patient reported.   Time Spent with patient on telephone encounter: 20 minutes  Review of Systems     Cardiac Risk Factors include: male gender;advanced age (>58men, >65 women);dyslipidemia     Objective:    Today's Vitals   10/10/21 0820  Weight: 166 lb (75.3 kg)  Height: 5\' 9"  (1.753 m)   Body mass index is 24.51 kg/m.  Advanced Directives 10/10/2021 10/24/2019 09/20/2019 07/28/2019 07/27/2019 02/03/2017  Does Patient Have a Medical Advance Directive? Yes Yes Yes Yes Yes Yes  Type of Paramedic of [place];Living will [place];Living will Healthcare Power of [place];Living will [place];Living will Living will;Healthcare Power of Attorney  Does patient want to make changes to medical advance directive? - - No - Patient declined - - -  Copy of [place] in Chart? No -  copy requested No - copy requested No - copy requested No - copy requested - -    Current Medications (verified) Outpatient Encounter Medications as of 10/10/2021  Medication Sig   acetaminophen (TYLENOL) 325 MG tablet Take 650 mg by mouth every 6 (six) hours as needed (for pain.).   amiodarone (PACERONE) 200 MG tablet TAKE 1 TABLET BY MOUTH EVERY DAY   apixaban (ELIQUIS) 5 MG TABS tablet Take 1 tablet (5 mg total) by mouth 2 (two) times daily.   candesartan (ATACAND) 8 MG tablet Take 0.5 tablets (4 mg total) by mouth 2 (two) times daily.   carboxymethylcellul-glycerin (OPTIVE) 0.5-0.9 % ophthalmic solution Place 1 drop into both eyes daily.   cetirizine (ZYRTEC) 10 MG tablet Take 10 mg by mouth as needed for allergies.   Colchicine (MITIGARE) 0.6 MG CAPS Take 0.6 mg by mouth 2 (two) times daily as needed (gout flare).   FARXIGA 10 MG TABS tablet TAKE 1 TABLET BY MOUTH EVERY DAY BEFORE BREAKFAST   furosemide (LASIX) 40 MG tablet TAKE 1 TABLET EVERY DAY   levothyroxine (SYNTHROID) 88 MCG tablet Take 1 tablet (88 mcg total) by mouth daily before breakfast.   nitroGLYCERIN (NITROSTAT) 0.4 MG SL tablet Place 1 tablet (0.4 mg total) under the tongue every 5 (five) minutes x 3 doses as needed for chest pain.   Omega-3 Fatty Acids (FISH OIL) 1200 MG CAPS Take 3,600 mg by mouth daily.   rosuvastatin (CRESTOR) 40 MG tablet Take 1 tablet (40 mg total) by mouth at bedtime.   spironolactone (ALDACTONE) 25 MG tablet TAKE 1/2 TABLET BY MOUTH EVERY DAY   No facility-administered encounter medications on  file as of 10/10/2021.    Allergies (verified) Atorvastatin, Entresto [sacubitril-valsartan], Sulfonamide derivatives, Tape, and Sulfamethoxazole   History: Past Medical History:  Diagnosis Date   AICD (automatic cardioverter/defibrillator) present 6/09   St. Jude   Bradycardia    Use of beta blocker limited   CAD (coronary artery disease)    a- S/p cabg in march 2006 by Dr.Owen;  b. [name] 5/12:  large IL scar from apex to base, no ischemia, EF 37%   Cancer (HCC)    on right side of neck   ED (erectile dysfunction)    Gout    Hyperlipidemia    Ischemic cardiomyopathy    a-Echocardiogram, Nov 2006, showed ejection fraction of 30-40% with mild to moderated mitral  regurgitation and mild aortic regurgitation b- Cardiac MRI, May 2007, EF of 44% with 50% scar involving  the inferolateral walls. No comment of mitral regurgitation. c- last echo  11/10: EF 30-35%  mod MR d- Nega T-Wave alternans testing in May of 2007 e- no ACE due to low BP;  f. CPX 3/11: normal   RBBB (right bundle branch block with left anterior fascicular block)    Systolic CHF, chronic (HCC)    Thyroid disease    Past Surgical History:  Procedure Laterality Date   CARDIAC DEFIBRILLATOR PLACEMENT  06/01/08   ICD   COLONOSCOPY     COLONOSCOPY WITH PROPOFOL N/A 07/28/2019   Procedure: COLONOSCOPY WITH PROPOFOL;  Surgeon: [name], MD;  Location: [facility];  Service: Endoscopy;  Laterality: N/A;   CORONARY ARTERY BYPASS GRAFT  02-2005   ICD GENERATOR CHANGEOUT N/A 02/03/2017   Procedure: ICD Generator Changeout;  Surgeon: [name], MD;  Location: [facility];  Service: Cardiovascular;  Laterality: N/A;   ORIF ANKLE FRACTURE  1987   left    RIGHT/LEFT HEART CATH AND CORONARY/GRAFT ANGIOGRAPHY N/A 09/20/2019   Procedure: RIGHT/LEFT HEART CATH AND CORONARY/GRAFT ANGIOGRAPHY;  Surgeon: [name], MD;  Location: [facility];  Service: Cardiovascular;  Laterality: N/A;   SHOULDER ARTHROSCOPY  02/2010   rt    TEE WITHOUT CARDIOVERSION N/A 10/24/2019   Procedure: TRANSESOPHAGEAL ECHOCARDIOGRAM (TEE);  Surgeon: [name], MD;  Location: [facility] END[facility]COPY;  Service: Cardiovascular;  Laterality: N/A;   TONSILLECTOMY     as a child   Family History  Problem Relation Age of Onset   Heart attack Father 40   Cardiomyopathy Brother    Colon cancer Neg Hx    Prostate cancer Neg Hx     Diabetes Neg Hx    Social History   Socioeconomic History   Marital status: Married    Spouse name: Not on file   Number of children: 2   Years of education: Not on file   Highest education level: Not on file  Occupational History   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     Employer: [employer]  Tobacco Use   Smoking status: Former    Packs/day: 0.10    Types: Cigarettes    Quit date: 12/16/1999    Years since quitting: 21.8   Smokeless tobacco: Never  Vaping Use   Vaping Use: Never used  Substance and Sexual Activity   Alcohol use: Yes    Comment: socially   Drug use: No   Sexual activity: Not on file  Other Topics Concern   Not on file  Social History Narrative   Retired    household- pt  and wife   2 adult children in [place] also has grandchildren   Former but not current smoker, occasional alcohol no drug use   Social Determinants of Radio broadcast assistant Strain: Medium Risk   Difficulty of Paying Living Expenses: Somewhat hard  Food Insecurity: No Food Insecurity   Worried About Charity fundraiser in the Last Year: Never true   Ran Out of Food in the Last Year: Never true  Transportation Needs: No Transportation Needs   Lack of Transportation (Medical): No   Lack of Transportation (Non-Medical): No  Physical Activity: Insufficiently Active   Days of Exercise per Week: 2 days   Minutes of Exercise per Session: 60 min  Stress: No Stress Concern Present   Feeling of Stress : Not at all  Social Connections: Moderately Integrated   Frequency of Communication with Friends and Family: More than three times a week   Frequency of Social Gatherings with Friends and Family: More than three times a week   Attends Religious Services: More than 4 times per year   Active Member of [employer] or Organizations: No   Attends Music therapist: Never   Marital Status: Married    Tobacco  Counseling Counseling given: Not Answered   Clinical Intake:  Pre-visit preparation completed: Yes        BMI - recorded: 24.51 Nutritional Status: BMI of 19-24  Normal Nutritional Risks: None Diabetes: No  How often do you need to have someone help you when you read instructions, pamphlets, or other written materials from your doctor or pharmacy?: 1 - Never  Diabetic?No  Interpreter Needed?: No  Information entered by :: Caroleen Hamman LPN   Activities of Daily Living In your present state of health, do you have any difficulty performing the following activities: 10/10/2021 06/18/2021  Hearing? N N  Vision? N N  Difficulty concentrating or making decisions? N N  Walking or climbing stairs? N N  Dressing or bathing? N N  Doing errands, shopping? N N  Preparing Food and eating ? N -  Using the Toilet? N -  In the past six months, have you accidently leaked urine? N -  Do you have problems with loss of bowel control? N -  Managing your Medications? N -  Managing your Finances? N -  Housekeeping or managing your Housekeeping? N -  Some recent data might be hidden    Patient Care Team: Colon Branch, MD as PCP - General Bensimhon, Shaune Pascal, MD as Consulting Physician (Cardiology) Memory Argue, MD as Referring Physician [name], MD as Consulting Physician (Cardiology) Cherre Robins, PharmD (Pharmacist)  Indicate any recent Medical Services you may have received from other than [facility] providers in the past year (date may be approximate).     Assessment:   This is a routine wellness examination for [place].  Hearing/Vision screen Hearing Screening - Comments:: No issues Vision Screening - Comments:: Last eye exam-wears glasses Last eye exam-09/2020-Dr. Ore  Dietary issues and exercise activities discussed: Current Exercise Habits: Home exercise routine, Type of exercise: walking (Golf), Time (Minutes): 60, Frequency (Times/Week): 4, Weekly Exercise  (Minutes/Week): 240, Intensity: Mild, Exercise limited by: None identified   Goals Addressed             This Visit's Progress    Patient Stated       Increase activity       Depression Screen PHQ 2/9 Scores 10/10/2021 06/18/2021 06/14/2020 06/03/2018 04/27/2017 04/21/2016  PHQ -  2 Score 0 1 0 0 0 0    Fall Risk Fall Risk  10/10/2021 06/18/2021 06/14/2020 06/03/2018 04/27/2017  Falls in the past year? 0 0 0 No No  Number falls in past yr: 0 0 0 - -  Injury with Fall? 0 0 0 - -  Follow up Falls prevention discussed Falls evaluation completed Falls evaluation completed - -    FALL RISK PREVENTION PERTAINING TO THE HOME:  Any stairs in or around the home? Yes  If so, are there any without handrails? No  Home free of loose throw rugs in walkways, pet beds, electrical cords, etc? Yes  Adequate lighting in your home to reduce risk of falls? Yes   ASSISTIVE DEVICES UTILIZED TO PREVENT FALLS:  Life alert? No  Use of a cane, walker or w/c? No  Grab bars in the bathroom? No  Shower chair or bench in shower? Yes  Elevated toilet seat or a handicapped toilet? No   TIMED UP AND GO:  Was the test performed? No . Phone visit   Cognitive Function:Normal cognitive status assessed by this Nurse Health Advisor. No abnormalities found.          Immunizations Immunization History  Administered Date(s) Administered   Fluad Quad(high Dose 65+) 10/25/2020   Influenza Split 10/10/2011, 10/11/2012   Influenza Whole 11/18/2007, 09/27/2008, 09/27/2009, 08/20/2010   Influenza, High Dose Seasonal PF 11/25/2016, 10/22/2017, 10/21/2018, 10/19/2019   Influenza,inj,Quad PF,6+ Mos 10/13/2013, 10/24/2014, 11/20/2015   PFIZER(Purple Top)SARS-COV-2 Vaccination 02/03/2020, 02/22/2020, 09/18/2020   Pneumococcal Conjugate-13 04/13/2014   Pneumococcal Polysaccharide-23 03/07/2009, 11/20/2015   Td 12/15/2006, 04/27/2017   Zoster Recombinat (Shingrix) 04/30/2018, 07/02/2018   Zoster, Live 04/11/2013     TDAP status: Up to date  Flu Vaccine status: Due, Education has been provided regarding the importance of this vaccine. Advised may receive this vaccine at local pharmacy or Health Dept. Aware to provide a copy of the vaccination record if obtained from local pharmacy or Health Dept. Verbalized acceptance and understanding.  Pneumococcal vaccine status: Up to date  Covid-19 vaccine status: Information provided on how to obtain vaccines.   Qualifies for Shingles Vaccine? No   Zostavax completed Yes   Shingrix Completed?: Yes  Screening Tests Health Maintenance  Topic Date Due   COVID-19 Vaccine (4 - Booster for Pfizer series) 11/13/2020   INFLUENZA VACCINE  07/15/2021   TETANUS/TDAP  04/28/2027   COLONOSCOPY (Pts 45-54yrs Insurance coverage will need to be confirmed)  07/27/2029   Pneumonia Vaccine 25+ Years old  Completed   Hepatitis C Screening  Completed   Zoster Vaccines- Shingrix  Completed   HPV VACCINES  Aged Out    Health Maintenance  Health Maintenance Due  Topic Date Due   COVID-19 Vaccine (4 - Booster for Pfizer series) 11/13/2020   INFLUENZA VACCINE  07/15/2021    Colorectal cancer screening: No longer required. Per last colonoscopy report on 07/28/2019.  Lung Cancer Screening: (Low Dose CT Chest recommended if Age 8-80 years, 30 pack-year currently smoking OR have quit w/in 15years.) does not qualify.    Additional Screening:  Hepatitis C Screening: Completed 04/27/2017  Vision Screening: Recommended annual ophthalmology exams for early detection of glaucoma and other disorders of the eye. Is the patient up to date with their annual eye exam?  Yes  Who is the provider or what is the name of the office in which the patient attends annual eye exams? Dr. Yvette Rack   Dental Screening: Recommended annual dental exams for proper oral  hygiene  Community Resource Referral / Chronic Care Management: CRR required this visit?  No   CCM required this visit?  No       Plan:     I have personally reviewed and noted the following in the patient's chart:   Medical and social history Use of alcohol, tobacco or illicit drugs  Current medications and supplements including opioid prescriptions. Patient is not currently taking opioid prescriptions. Functional ability and status Nutritional status Physical activity Advanced directives List of other physicians Hospitalizations, surgeries, and ER visits in previous 12 months Vitals Screenings to include cognitive, depression, and falls Referrals and appointments  In addition, I have reviewed and discussed with patient certain preventive protocols, quality metrics, and best practice recommendations. A written personalized care plan for preventive services as well as general preventive health recommendations were provided to patient.   Due to this being a telephonic visit, the after visit summary with patients personalized plan was offered to patient via mail or my-chart.  Patient would like to access on my-chart.   Marta Antu, LPN   [id number]  Nurse health Advisor  Nurse Notes: None

## 2021-10-22 ENCOUNTER — Other Ambulatory Visit: Payer: Self-pay

## 2021-10-22 ENCOUNTER — Other Ambulatory Visit (INDEPENDENT_AMBULATORY_CARE_PROVIDER_SITE_OTHER): Payer: Medicare HMO

## 2021-10-22 DIAGNOSIS — E038 Other specified hypothyroidism: Secondary | ICD-10-CM

## 2021-10-23 ENCOUNTER — Telehealth: Payer: Self-pay | Admitting: *Deleted

## 2021-10-23 NOTE — Addendum Note (Signed)
Addended by: Kelle Darting A on: 10/23/2021 08:06 AM   Modules accepted: Orders

## 2021-10-23 NOTE — Telephone Encounter (Signed)
Received call from Matthew Joyce at [facility] lab yesterday stating pt's TSH would need to be recollected due to lab accident.  Notified pt. He prefers first appt of the day.  He will return 10/30/21 at 7:15.

## 2021-10-28 ENCOUNTER — Other Ambulatory Visit: Payer: Self-pay | Admitting: Internal Medicine

## 2021-10-29 ENCOUNTER — Ambulatory Visit (INDEPENDENT_AMBULATORY_CARE_PROVIDER_SITE_OTHER): Payer: Medicare HMO

## 2021-10-29 DIAGNOSIS — I429 Cardiomyopathy, unspecified: Secondary | ICD-10-CM | POA: Diagnosis not present

## 2021-10-29 LAB — CUP PACEART REMOTE DEVICE CHECK
Battery Remaining Longevity: 59 mo
Battery Remaining Percentage: 61 %
Battery Voltage: 2.96 V
Brady Statistic RV Percent Paced: 1 %
Date Time Interrogation Session: 20221115040021
HighPow Impedance: 52 Ohm
HighPow Impedance: 52 Ohm
Implantable Lead Implant Date: 20090618
Implantable Lead Location: 753860
Implantable Lead Model: 7121
Implantable Pulse Generator Implant Date: 20180220
Lead Channel Impedance Value: 390 Ohm
Lead Channel Pacing Threshold Amplitude: 1.75 V
Lead Channel Pacing Threshold Pulse Width: 0.6 ms
Lead Channel Sensing Intrinsic Amplitude: 3.3 mV
Lead Channel Setting Pacing Amplitude: 3.5 V
Lead Channel Setting Pacing Pulse Width: 0.6 ms
Lead Channel Setting Sensing Sensitivity: 0.5 mV
Pulse Gen Serial Number: 7406967

## 2021-10-30 ENCOUNTER — Other Ambulatory Visit (INDEPENDENT_AMBULATORY_CARE_PROVIDER_SITE_OTHER): Payer: Medicare HMO

## 2021-10-30 ENCOUNTER — Other Ambulatory Visit: Payer: Self-pay

## 2021-10-30 DIAGNOSIS — E038 Other specified hypothyroidism: Secondary | ICD-10-CM

## 2021-10-30 LAB — TSH: TSH: 2.04 u[IU]/mL (ref 0.35–5.50)

## 2021-10-31 DIAGNOSIS — H524 Presbyopia: Secondary | ICD-10-CM | POA: Diagnosis not present

## 2021-11-06 NOTE — Progress Notes (Signed)
Remote ICD transmission.   

## 2021-11-21 ENCOUNTER — Ambulatory Visit: Payer: Medicare HMO | Admitting: Pharmacist

## 2021-11-21 DIAGNOSIS — R739 Hyperglycemia, unspecified: Secondary | ICD-10-CM

## 2021-11-21 DIAGNOSIS — I25810 Atherosclerosis of coronary artery bypass graft(s) without angina pectoris: Secondary | ICD-10-CM

## 2021-11-21 DIAGNOSIS — I509 Heart failure, unspecified: Secondary | ICD-10-CM

## 2021-11-21 DIAGNOSIS — E785 Hyperlipidemia, unspecified: Secondary | ICD-10-CM

## 2021-11-21 DIAGNOSIS — R7989 Other specified abnormal findings of blood chemistry: Secondary | ICD-10-CM

## 2021-11-21 MED ORDER — DAPAGLIFLOZIN PROPANEDIOL 10 MG PO TABS: ORAL_TABLET | ORAL | 3 refills | Status: DC

## 2021-11-21 NOTE — Patient Instructions (Signed)
Visit Information  PATIENT GOALS:  Goals Addressed             This Visit's Progress    Chronic Care Management Pharmacy Care Plan   On track    CARE PLAN ENTRY (see longitudinal plan of care for additional care plan information)  Current Barriers:  Chronic Disease Management support, education, and care coordination needs related to Pre-DM, HF, HTN, HLD, CAD, Gout, Pain, Dry Eye, Allergies   Hypertension Screening BP Readings from Last 3 Encounters:  07/02/21 (!) 102/50  06/18/21 108/60  04/25/21 102/60  Pharmacist Clinical Goal(s): Over the next 180 days, patient will work with PharmD and providers to maintain BP goal <130/80 Current regimen:  Candesartan 8mg  - take 0.5 tablet = 4mg  twice a day  Spironolactone 25mg  - take 0.5 tablet daily Patient self care activities - Over the next 180 days, patient will: Maintain hypertension medication regimen. Continue to check blood pressure and heart rate every other day  Irregular Heart Rate: Pharmacist Clinical Goal(s): Over the next 180 days, patient will work with PharmD and providers to maintain heart rate in normal sinus rhythm and prevent stroke; Also will work with patient to apply for assistance with Eliquis cost Current regimen:  Eliquis 5mg  take 1 tablet twice a day Amiodarone 200mg  daily Patient self care activities - Over the next 180 days, patient will: Maintain amiodarone for heart rate / rhythm and continue to take Eliquis for stroke prevention  Report any signs of bleeding  Hyperlipidemia Lab Results  Component Value Date/Time   [facility] 59 04/25/2021 11:33 AM  Pharmacist Clinical Goal(s): Over the next 180 days, patient will work with PharmD and providers to maintain LDL goal < 70 Current regimen:  Rosuvastatin 40mg  daily Patient self care activities - Over the next 180 days, patient will: Maintain cholesterol medication regimen.   Pre-Diabetes Lab Results  Component Value Date/Time   HGBA1C 6.0  06/18/2021 08:33 AM   HGBA1C 6.2 06/14/2020 08:36 AM  Pharmacist Clinical Goal(s): Over the next 180 days, patient will work with PharmD and providers to maintain A1c goal <6.5% Current regimen:  Farxiga 10mg  daily (primarily for heart failure diagnosis) Interventions: Discussed diet and exercise Discussed patient assistance options now that patient is in Medicare coverage gap Patient self care activities - Over the next 180 days, patient will: Maintain a1c <6.5%   Heart Failure Pharmacist Clinical Goal(s) Over the next 180 days, patient will work with PharmD and providers to reduce symptoms of heart failure Current regimen:  Candesartan 8mg  - take 0.5 tablet = 4mg  twice a day  Farxiga 10mg  daily Furosemide 40mg  daily Spironolactone 25mg  - take 0.5 tablet daily  Interventions: Reviewed importance of daily weights and when to report weight gain  More than 3lbs in 1 day  More than 5lbs in 1 week  Contact cardio, primary care, or pharmacy team Patient self care activities - Over the next 180 days, patient will: Weigh daily Maintain heart failure medication regimen Call clinical pharmacist at [phone] if you have any problems getting [place] form patient assistance in 2023.   Medication management Pharmacist Clinical Goal(s): Over the next 180 days, patient will work with PharmD and providers to maintain optimal medication adherence Current pharmacy: [facility] Comprehensive medication review performed. Continue current medication management strategy Discussed options for assistance when he reaches Medicare coverage gap.  Patient self care activities - Over the next 180 days, patient will: Focus on medication adherence by filling and taking medications appropriately Take medications as  prescribed Report any questions or concerns to PharmD and/or provider(s)   Please see past updates related to this goal by clicking on the "Past Updates" button in the selected goal          The patient verbalized understanding of instructions, educational materials, and care plan provided today and declined offer to receive copy of patient instructions, educational materials, and care plan.   Telephone follow up appointment with care management team member scheduled for: 2 to 3 months  Cherre Robins, PharmD Clinical Pharmacist [facility]

## 2021-11-21 NOTE — Chronic Care Management (AMB) (Signed)
Chronic Care Management Pharmacy Note  11/21/2021 Name:  Matthew MADARIAGA Sr. MRN:  [id number] DOB:  1951/10/10  Subjective: Matthew Romans Sr. is an 70 y.o. year old male who is a primary patient of Paz, Alda Berthold, MD.  The CCM team was consulted for assistance with disease management and care coordination needs.    Engaged with patient by telephone for follow up visit in response to provider referral for pharmacy case management and/or care coordination services.   Consent to Services:  The patient was given information about Chronic Care Management services, agreed to services, and gave verbal consent prior to initiation of services.  Please see initial visit note for detailed documentation.   Patient Care Team: Colon Branch, MD as PCP - General Bensimhon, Shaune Pascal, MD as Consulting Physician (Cardiology) Memory Argue, MD as Referring Physician [name], MD as Consulting Physician (Cardiology) Cherre Robins, [place] (Pharmacist)  Recent office visits: 06/18/2021 - PCP (Dr Larose Kells) Annual Physical. Labs checked. No med changes.  03/14/2021 - Lab - TSH elevated - Levothyroxine increased to 40mg daily  Recent consult visits: 04/20/18/2022 - Cardio (Dr TLovena Le F/U systolic CHF and Afib. No med changes.  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 Continue fluoride trays nightly and see his dentist biannually.  01/2021 - Cardio (Dr BHaroldine Laws f/u CHF, CAD and afib. No med changes.    Hospital visits: None in previous 6 months  Objective:  Lab Results  Component Value Date   CREATININE 1.10 04/25/2021   CREATININE 0.99 12/25/2020   CREATININE 0.94 10/11/2020    Lab Results  Component Value Date   HGBA1C 6.0 06/18/2021   Last diabetic Eye exam: No  results found for: HMDIABEYEEXA  Last diabetic Foot exam: No results found for: HMDIABFOOTEX      Component Value Date/Time   CHOL 147 04/25/2021 1133   TRIG 55 04/25/2021 1133   TRIG 45 12/16/2006 0912   HDL 77 04/25/2021 1133   CHOLHDL 1.9 04/25/2021 1133   VLDL 11 04/25/2021 1133   LDLCALC 59 04/25/2021 1133    Hepatic Function Latest Ref Rng & Units 04/25/2021 09/13/2020 06/14/2020  Total Protein 6.5 - 8.1 g/dL 6.5 6.7 -  Albumin 3.5 - 5.0 g/dL 3.9 3.9 -  AST 15 - 41 U/L 34 25 21  ALT 0 - 44 U/L '27 29 18  ' Alk Phosphatase 38 - 126 U/L 55 63 -  Total Bilirubin 0.3 - 1.2 mg/dL 1.9(H) 2.3(H) -  Bilirubin, Direct 0.0 - 0.3 mg/dL - - -    Lab Results  Component Value Date/Time   TSH 2.04 10/30/2021 07:11 AM   TSH 2.08 04/24/2021 07:05 AM   FREET4 0.91 06/14/2020 08:36 AM   FREET4 1.28 06/03/2018 10:43 AM    CBC Latest Ref Rng & Units 04/25/2021 12/25/2020 10/11/2020  WBC 4.0 - 10.5 K/uL 5.9 6.3 6.9  Hemoglobin 13.0 - 17.0 g/dL 14.7 14.9 14.8  Hematocrit 39.0 - 52.0 % 45.8 47.1 45.5  Platelets 150 - 400 K/uL 201 204 193    No results found for: VD25OH  Clinical ASCVD: Yes  The 10-year ASCVD risk score (Arnett DK, et al., 2019) is: 11.5%   Values used to calculate the score:     Age: [age]years  Sex: Male     Is Non-Hispanic African American: No     Diabetic: No     Tobacco smoker: No     Systolic Blood Pressure: 155 mmHg     Is BP treated: No     HDL Cholesterol: 77 mg/dL     Total Cholesterol: 147 mg/dL    Other: CHADSVAsc = 5  Social History   Tobacco Use  Smoking Status Former   Packs/day: 0.10   Types: Cigarettes   Quit date: 12/16/1999   Years since quitting: 21.9  Smokeless Tobacco Never   BP Readings from Last 3 Encounters:  07/02/21 (!) 102/50  06/18/21 108/60  04/25/21 102/60   Pulse Readings from Last 3 Encounters:  07/02/21 (!) 50  06/18/21 63  04/25/21 (!) 49   Wt Readings from Last 3 Encounters:  10/10/21 166 lb (75.3 kg)  07/02/21 166  lb (75.3 kg)  06/18/21 167 lb 6 oz (75.9 kg)    Assessment: Review of patient past medical history, allergies, medications, health status, including review of consultants reports, laboratory and other test data, was performed as part of comprehensive evaluation and provision of chronic care management services.   SDOH:  (Social Determinants of Health) assessments and interventions performed:     CCM Care Plan  Allergies  Allergen Reactions   Atorvastatin      myalgia, Muscle aching   Entresto [Sacubitril-Valsartan] Itching    Had itching and redness   Sulfonamide Derivatives     Rash and hot flashes   Tape Other (See Comments)    Rips skin- use PAPER tape   Sulfamethoxazole Rash and Other (See Comments)    Hot flashes    Medications Reviewed Today     Reviewed by Marta Antu, LPN (Licensed Practical Nurse) on 10/10/21 at [phone]  Med List Status: <None>   Medication Order Taking? Sig Documenting Provider Last Dose Status Informant  acetaminophen (TYLENOL) 325 MG tablet [id number]  Take 650 mg by mouth every 6 (six) hours as needed (for pain.). [provider]  Active Self           Med Note Isac Caddy, Octavia Heir   Wed Oct 12, 2019  2:03 PM)    amiodarone (PACERONE) 200 MG tablet [id number]  TAKE 1 TABLET BY MOUTH EVERY DAY Bensimhon, Shaune Pascal, MD  Active   apixaban (ELIQUIS) 5 MG TABS tablet [id number]  Take 1 tablet (5 mg total) by mouth 2 (two) times daily. [name], MD  Active   candesartan (ATACAND) 8 MG tablet [id number]  Take 0.5 tablets (4 mg total) by mouth 2 (two) times daily. Bensimhon, Shaune Pascal, MD  Active   carboxymethylcellul-glycerin (OPTIVE) 0.5-0.9 % ophthalmic solution [id number]  Place 1 drop into both eyes daily. [provider]  Active Self  cetirizine (ZYRTEC) 10 MG tablet [id number]  Take 10 mg by mouth as needed for allergies. [provider]  Active Self  Colchicine (MITIGARE) 0.6 MG CAPS [id number]  Take 0.6 mg by mouth 2  (two) times daily as needed (gout flare). Colon Branch, MD  Active            Med Note Rose Phi   Tue Jul 02, 2021  2:32 PM)    FARXIGA 10 MG TABS tablet [id number]  TAKE 1 TABLET BY MOUTH EVERY DAY BEFORE BREAKFAST Bensimhon, Shaune Pascal, MD  Active   furosemide (LASIX) 40 MG tablet [id number]  TAKE 1 TABLET EVERY DAY Bensimhon, Shaune Pascal, MD  Active   levothyroxine (SYNTHROID) 88 MCG tablet [id number]  Take 1 tablet (88 mcg total) by mouth daily before breakfast. Colon Branch, MD  Active   nitroGLYCERIN (NITROSTAT) 0.4 MG SL tablet [id number]  Place 1 tablet (0.4 mg total) under the tongue every 5 (five) minutes x 3 doses as needed for chest pain. Colon Branch, MD  Active            Med Note ([place] Jul 02, 2021  2:32 PM)    Omega-3 Fatty Acids (FISH OIL) 1200 MG CAPS [id number]  Take 3,600 mg by mouth daily. [provider]  Active Self  rosuvastatin (CRESTOR) 40 MG tablet [id number]  Take 1 tablet (40 mg total) by mouth at bedtime. 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 ([place]) 11/12/2017   PCP NOTES >>>>>>>>>>>>>>>>>>>>> 04/21/2016   Seborrheic dermatitis of scalp 04/11/2013   Annual physical exam 04/11/2011   MITRAL REGURGITATION 48/12/6551   SYSTOLIC HEART FAILURE, CHRONIC 10/03/2009   Hyperglycemia 06/19/2008   Coronary atherosclerosis 06/14/2008   Automatic implantable cardioverter-defibrillator in situ 06/01/2008   ERECTILE  DYSFUNCTION 12/17/2007   Hyperlipidemia 02/06/2007   Gout 02/06/2007   RBBB 02/06/2007   Congestive heart failure ([place]) 02/06/2007   CORONARY ARTERY BYPASS GRAFT, FOUR VESSEL, HX OF 02/12/2005    Immunization History  Administered Date(s) Administered   Fluad Quad(high Dose 65+) 10/25/2020   Influenza Split 10/10/2011, 10/11/2012   Influenza Whole 11/18/2007, 09/27/2008, 09/27/2009, 08/20/2010   Influenza, High Dose Seasonal PF 11/25/2016, 10/22/2017, 10/21/2018, 10/19/2019   Influenza,inj,Quad PF,6+ Mos 10/13/2013, 10/24/2014, 11/20/2015   PFIZER(Purple Top)SARS-COV-2 Vaccination 02/03/2020, 02/22/2020, 09/18/2020   Pneumococcal Conjugate-13 04/13/2014   Pneumococcal Polysaccharide-23 03/07/2009, 11/20/2015   Td 12/15/2006, 04/27/2017   Zoster Recombinat (Shingrix) 04/30/2018, 07/02/2018   Zoster, Live 04/11/2013    Conditions to be addressed/monitored: CHF, CAD, HTN, HLD and Thyroid disease, gout, metastatic throat cancer; pre diabetes  Care Plan : General Pharmacy (Adult)  Updates made by Cherre Robins, RPH-CPP since 11/21/2021 12:00 AM     Problem: Medication Adherence (Wellness)      Long-Range Goal: Medication Adherence Maintained   Start Date: 03/14/2021  Recent Progress: On track  Priority: High  Note:   Current Barriers:  Suboptimal therapeutic regimen for thyroid disease Usually enters coverage gap in June or July Cost of Eliquis and Wilder Glade has increased with last fills - in Medicare coverage gap  Pharmacist Clinical Goal(s):  Over the next 180 days, patient will verbalize ability to afford treatment regimen adhere to plan to optimize therapeutic regimen for thyroid disease, CHF, HTN and lipids as evidenced by report of adherence to recommended medication management changes through collaboration with PharmD and provider.  Working with clinical pharmacist to apply for patient assistance for Farxiga and Eliquis  Interventions: 1:1 collaboration with Colon Branch, MD regarding development and update of comprehensive plan of care as  evidenced by provider attestation and co-signature Inter-disciplinary care team collaboration (see longitudinal plan of care) Comprehensive medication review performed; medication list updated in electronic medical record  Pre - Diabetes: Controlled; current treatment: diet and exercise (he is taking Farxiga 75m daily but this is mostly for CHF) Current glucose readings: does not monitor BG at home Current exercise: frequent golfing and some walking Not addressed at this visit but patient is to continue previously addressed recommendations  Hypertension / CHF: Controlled;  current treatment:  candasartan 865m- take 0.5 tablet = 70m370mwice a day furosemide 73m77mily spironolactone 25mg9make 0.5 tablet daily Farxiga 10mg 60my (receiving through PAP until 12/14/2021) Current home readings: 120's  / 70's Unable to tolerate Entresto - caused itching and redness Interventions:  Reviewed importance of weighing daily and when to notify provider of weight increases.  Recommended continue current therapy Prescription sent to [facility] for Farxiga. [place] thru 2023.   Hyperlipidemia: Controlled Current treatment: rosuvastatin 73mg d59m  Medications previously tried: atorvastatin   Recommended continue current therapy   Atrial Fibrillation / VT: Controlled Current rate/rhythm control - amiodarone 200mg da22mAnticoagulant treatment: Eliquis 5mg twic65maily CHADS2VASc score: 4 Patient in Medicare Coverage gap; applied for Eliquis PAP but due to 2021 income patient was not approved.  Interventions:  Recommended continue current therapy Plan to try for PAP again in 2023 as patient's financial situation is different in 2022 than 2021.  Will check back in with patient in 2023.   Patient Goals/Self-Care Activities Over the next 180 days, patient will:  take  medications as prescribed and collaborate with provider on medication access solutions  Follow Up Plan: Telephone follow up appointment with care management team member scheduled for:  Clinical pharmacist will follow up in 2 to 3 months as planned unless needed sooner.         Medication Assistance:  Farxiga o[place] Glade [facility] [place] [facility].  Enrollment ends 12/14/2021   I have send prescription today for Farxiga to [facility] which will re qualify him for 2023 patient assistance program.  Applied for medication assistance for Eliquis but patient was denied (based on 2021 income)   Patient's [facility] pharmacy is:  [facility] [place] [phone]:  Patient agrees to Care Plan and Follow-up.  Plan: Telephone follow up appointment with care management team member scheduled for:   2 to 3 months .   Eh Sauseda EckCherre RobinsClinical Pharmacist [place] [facility]Mary [facility]

## 2021-12-03 ENCOUNTER — Other Ambulatory Visit: Payer: Self-pay | Admitting: Internal Medicine

## 2022-01-14 ENCOUNTER — Ambulatory Visit (INDEPENDENT_AMBULATORY_CARE_PROVIDER_SITE_OTHER): Payer: Medicare HMO | Admitting: Pharmacist

## 2022-01-14 DIAGNOSIS — E785 Hyperlipidemia, unspecified: Secondary | ICD-10-CM | POA: Diagnosis not present

## 2022-01-14 DIAGNOSIS — I509 Heart failure, unspecified: Secondary | ICD-10-CM | POA: Diagnosis not present

## 2022-01-14 DIAGNOSIS — I110 Hypertensive heart disease with heart failure: Secondary | ICD-10-CM | POA: Diagnosis not present

## 2022-01-14 DIAGNOSIS — I4891 Unspecified atrial fibrillation: Secondary | ICD-10-CM

## 2022-01-14 DIAGNOSIS — R739 Hyperglycemia, unspecified: Secondary | ICD-10-CM

## 2022-01-14 DIAGNOSIS — I25810 Atherosclerosis of coronary artery bypass graft(s) without angina pectoris: Secondary | ICD-10-CM

## 2022-01-14 NOTE — Chronic Care Management (AMB) (Signed)
Chronic Care Management Pharmacy Note  01/14/2022 Name:  Matthew UNREIN Sr. MRN:  [id number] DOB:  10-May-1951  Subjective: Matthew Romans Sr. is an 71 y.o. year old male who is a primary patient of Paz, Alda Berthold, MD.  The CCM team was consulted for assistance with disease management and care coordination needs.    Engaged with patient by telephone and coordinated with patient assistance program for follow up visit in response to provider referral for pharmacy case management and/or care coordination services.   Consent to Services:  The patient was given information about Chronic Care Management services, agreed to services, and gave verbal consent prior to initiation of services.  Please see initial visit note for detailed documentation.   Patient Care Team: Colon Branch, MD as PCP - General Bensimhon, Shaune Pascal, MD as Consulting Physician (Cardiology) Memory Argue, MD as Referring Physician [name], MD as Consulting Physician (Cardiology) Cherre Robins, [place] (Pharmacist)  Recent office visits: 06/18/2021 - PCP (Dr Larose Kells) Annual Physical. Labs checked. No med changes.  03/14/2021 - Lab - TSH elevated - Levothyroxine increased to 11mg daily  Recent consult visits: 04/20/18/2022 - Cardio (Dr TLovena Le F/U systolic CHF and Afib. No med changes.  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 Continue fluoride trays nightly and see his dentist biannually.  01/2021 - Cardio (Dr BHaroldine Laws f/u CHF, CAD and afib. No med changes.    Hospital visits: None in previous 6 months  Objective:  Lab Results  Component Value Date   CREATININE 1.10 04/25/2021   CREATININE 0.99 12/25/2020   CREATININE 0.94 10/11/2020    Lab Results  Component Value Date    HGBA1C 6.0 06/18/2021   Last diabetic Eye exam: No results found for: HMDIABEYEEXA  Last diabetic Foot exam: No results found for: HMDIABFOOTEX      Component Value Date/Time   CHOL 147 04/25/2021 1133   TRIG 55 04/25/2021 1133   TRIG 45 12/16/2006 0912   HDL 77 04/25/2021 1133   CHOLHDL 1.9 04/25/2021 1133   VLDL 11 04/25/2021 1133   LDLCALC 59 04/25/2021 1133    Hepatic Function Latest Ref Rng & Units 04/25/2021 09/13/2020 06/14/2020  Total Protein 6.5 - 8.1 g/dL 6.5 6.7 -  Albumin 3.5 - 5.0 g/dL 3.9 3.9 -  AST 15 - 41 U/L 34 25 21  ALT 0 - 44 U/L '27 29 18  ' Alk Phosphatase 38 - 126 U/L 55 63 -  Total Bilirubin 0.3 - 1.2 mg/dL 1.9(H) 2.3(H) -  Bilirubin, Direct 0.0 - 0.3 mg/dL - - -    Lab Results  Component Value Date/Time   TSH 2.04 10/30/2021 07:11 AM   TSH 2.08 04/24/2021 07:05 AM   FREET4 0.91 06/14/2020 08:36 AM   FREET4 1.28 06/03/2018 10:43 AM    CBC Latest Ref Rng & Units 04/25/2021 12/25/2020 10/11/2020  WBC 4.0 - 10.5 K/uL 5.9 6.3 6.9  Hemoglobin 13.0 - 17.0 g/dL 14.7 14.9 14.8  Hematocrit 39.0 - 52.0 % 45.8 47.1 45.5  Platelets 150 - 400 K/uL 201 204 193    No results found for: VD25OH  Clinical ASCVD: Yes  The 10-year ASCVD risk score (Arnett DK, et al., 2019) is: 11.5%   Values used to calculate the score:  Age: 34 years     Sex: Male     Is Non-Hispanic African American: No     Diabetic: No     Tobacco smoker: No     Systolic Blood Pressure: 650 mmHg     Is BP treated: No     HDL Cholesterol: 77 mg/dL     Total Cholesterol: 147 mg/dL    Other: CHADSVAsc = 5  Social History   Tobacco Use  Smoking Status Former   Packs/day: 0.10   Types: Cigarettes   Quit date: 12/16/1999   Years since quitting: 22.0  Smokeless Tobacco Never   BP Readings from Last 3 Encounters:  07/02/21 (!) 102/50  06/18/21 108/60  04/25/21 102/60   Pulse Readings from Last 3 Encounters:  07/02/21 (!) 50  06/18/21 63  04/25/21 (!) 49   Wt Readings from Last 3  Encounters:  10/10/21 166 lb (75.3 kg)  07/02/21 166 lb (75.3 kg)  06/18/21 167 lb 6 oz (75.9 kg)    Assessment: Review of patient past medical history, allergies, medications, health status, including review of consultants reports, laboratory and other test data, was performed as part of comprehensive evaluation and provision of chronic care management services.   SDOH:  (Social Determinants of Health) assessments and interventions performed:  SDOH Interventions    Flowsheet Row Most Recent Value  SDOH Interventions   Financial Strain Interventions Other (Comment)  [patient enrolled in [place] and [place] PAP for Farxiga thru 12/14/2022,  Will have to spend 3% of income OOP for Eliquis PAP.]        CCM Care Plan  Allergies  Allergen Reactions   Atorvastatin      myalgia, Muscle aching   Entresto [Sacubitril-Valsartan] Itching    Had itching and redness   Sulfonamide Derivatives     Rash and hot flashes   Tape Other (See Comments)    Rips skin- use PAPER tape   Sulfamethoxazole Rash and Other (See Comments)    Hot flashes    Medications Reviewed Today     Reviewed by Cherre Robins, RPH-CPP (Pharmacist) on 01/14/22 at 1442  Med List Status: <None>   Medication Order Taking? Sig Documenting Provider Last Dose Status Informant  acetaminophen (TYLENOL) 325 MG tablet [id number] Yes Take 650 mg by mouth every 6 (six) hours as needed (for pain.). [provider] Taking Active Self           Med Note Isac Caddy, Octavia Heir   Wed Oct 12, 2019  2:03 PM)    amiodarone (PACERONE) 200 MG tablet [id number] Yes TAKE 1 TABLET BY MOUTH EVERY DAY Bensimhon, Shaune Pascal, MD Taking Active   apixaban (ELIQUIS) 5 MG TABS tablet [id number] Yes Take 1 tablet (5 mg total) by mouth 2 (two) times daily. [name], MD Taking Active   candesartan (ATACAND) 8 MG tablet [id number] Yes Take 0.5 tablets (4 mg total) by mouth 2 (two) times daily. Bensimhon, Shaune Pascal, MD Taking Active    carboxymethylcellul-glycerin (OPTIVE) 0.5-0.9 % ophthalmic solution [id number] Yes Place 1 drop into both eyes daily. [provider] Taking Active Self  cetirizine (ZYRTEC) 10 MG tablet [id number] Yes Take 10 mg by mouth as needed for allergies. [provider] Taking Active Self  Colchicine (MITIGARE) 0.6 MG CAPS [id number] Yes Take 0.6 mg by mouth 2 (two) times daily as needed (gout flare). Colon Branch, MD Taking Active            Med Note (BOYD, Wendall Papa  Tue Jul 02, 2021  2:32 PM)    dapagliflozin propanediol (FARXIGA) 10 MG TABS tablet [id number] Yes TAKE 1 TABLET BY MOUTH EVERY DAY BEFORE BREAKFAST Colon Branch, MD Taking Active   furosemide (LASIX) 40 MG tablet [id number] Yes TAKE 1 TABLET EVERY DAY Bensimhon, Shaune Pascal, MD Taking Active   levothyroxine (SYNTHROID) 88 MCG tablet [id number] Yes TAKE 1 TABLET BY MOUTH DAILY BEFORE BREAKFAST. Colon Branch, MD Taking Active   nitroGLYCERIN (NITROSTAT) 0.4 MG SL tablet [id number] Yes Place 1 tablet (0.4 mg total) under the tongue every 5 (five) minutes x 3 doses as needed for chest pain. Colon Branch, MD Taking Active            Med Note Juliette Alcide Jul 02, 2021  2:32 PM)    Omega-3 Fatty Acids (FISH OIL) 1200 MG CAPS [id number] Yes Take 3,600 mg by mouth daily. [provider] Taking Active Self  rosuvastatin (CRESTOR) 40 MG tablet [id number] Yes TAKE 1 TABLET BY MOUTH EVERYDAY AT BEDTIME Colon Branch, MD Taking Active   spironolactone (ALDACTONE) 25 MG tablet [id number] Yes TAKE 1/2 TABLET BY MOUTH EVERY DAY Bensimhon, Shaune Pascal, MD Taking Active             Patient Active Problem List   Diagnosis Date Noted   VT (ventricular tachycardia) 07/02/2021   Paroxysmal atrial fibrillation ([place]) 07/02/2021   Heme + stool    Internal and external prolapsed hemorrhoids    Difficulty swallowing 01/21/2018   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 ([place]) 12/24/2017   Malignant neoplasm  metastatic to lymph nodes of neck with unknown primary site ([place]) 11/12/2017   PCP NOTES >>>>>>>>>>>>>>>>>>>>> 04/21/2016   Seborrheic dermatitis of scalp 04/11/2013   Annual physical exam 04/11/2011   MITRAL REGURGITATION 82/42/3536   SYSTOLIC HEART FAILURE, CHRONIC 10/03/2009   Hyperglycemia 06/19/2008   Coronary atherosclerosis 06/14/2008   Automatic implantable cardioverter-defibrillator in situ 06/01/2008   ERECTILE DYSFUNCTION 12/17/2007   Hyperlipidemia 02/06/2007   Gout 02/06/2007   RBBB 02/06/2007   Congestive heart failure ([place]) 02/06/2007   CORONARY ARTERY BYPASS GRAFT, FOUR VESSEL, HX OF 02/12/2005    Immunization History  Administered Date(s) Administered   Fluad Quad(high Dose 65+) 10/25/2020   Influenza Split 10/10/2011, 10/11/2012   Influenza Whole 11/18/2007, 09/27/2008, 09/27/2009, 08/20/2010   Influenza, High Dose Seasonal PF 11/25/2016, 10/22/2017, 10/21/2018, 10/19/2019   Influenza,inj,Quad PF,6+ Mos 10/13/2013, 10/24/2014, 11/20/2015   PFIZER(Purple Top)SARS-COV-2 Vaccination 02/03/2020, 02/22/2020, 09/18/2020   Pneumococcal Conjugate-13 04/13/2014   Pneumococcal Polysaccharide-23 03/07/2009, 11/20/2015   Td 12/15/2006, 04/27/2017   Zoster Recombinat (Shingrix) 04/30/2018, 07/02/2018   Zoster, Live 04/11/2013    Conditions to be addressed/monitored: CHF, CAD, HTN, HLD and Thyroid disease, gout, metastatic throat cancer; pre diabetes  Care Plan : General Pharmacy (Adult)  Updates made by Cherre Robins, RPH-CPP since 01/14/2022 12:00 AM     Problem: Medication Adherence (Wellness)      Long-Range Goal: Medication Adherence Maintained   Start Date: 03/14/2021  Recent Progress: On track  Priority: High  Note:   Current Barriers:  Suboptimal therapeutic regimen for thyroid disease Usually enters coverage gap in May / June  Pharmacist Clinical Goal(s):  Over the next 180 days, patient will verbalize ability to afford treatment regimen adhere to plan  to optimize therapeutic regimen for thyroid disease, CHF, HTN and lipids as evidenced by report of adherence to recommended medication management changes through collaboration with PharmD and provider.  Working with clinical pharmacist to apply for patient assistance for Farxiga and Eliquis  Interventions: 1:1 collaboration with Colon Branch, MD regarding development and update of comprehensive plan of care as evidenced by provider attestation and co-signature Inter-disciplinary care team collaboration (see longitudinal plan of care) Comprehensive medication review performed; medication list updated in electronic medical record  Pre - Diabetes: Controlled; current treatment: diet and exercise (he is taking Farxiga 67m daily but this is mostly for CHF) Current glucose readings: does not monitor BG at home Current exercise: frequent golfing and some walking  Hypertension / CHF: Controlled;  current treatment:  candasartan 852m- take 0.5 tablet = 67m43mwice a day furosemide 6m33mily spironolactone 25mg103make 0.5 tablet daily Farxiga 10mg 25my (receiving through PAP until 12/14/2022) Current home readings: 120's  / 70's Unable to tolerate Entresto - caused itching and redness Interventions:  Reviewed importance of weighing daily and when to notify provider of weight increases.  Recommended continue current therapy [place]inated [place] patient [place]. Patient has a been approved thru 12/14/2022. His last shipment was 12/10/2021. Next FarxigIranent 01/21/2022.   Hyperlipidemia: Controlled Current treatment: rosuvastatin 6mg d38m  Medications previously tried: atorvastatin   Recommended continue current therapy   Atrial Fibrillation / VT: Controlled Current rate/rhythm control - amiodarone 200mg da73mAnticoagulant treatment: Eliquis 5mg twic69maily CHADS2VASc score: 4 Patient in Medicare Coverage gap; applied for Eliquis PAP but due to 2021 income patient  was not approved.  Interventions:  Recommended continue current therapy Plan to try for PAP again in 2023 as patient's financial situation is different in 2022 than 2021.  Will check back in with patient later in 2023 as he has to spend 3% of household income on medication to qualify for patient assistance program for Eliquis.   Patient Goals/Self-Care Activities Over the next 180 days, patient will:  take medications as prescribed and collaborate with provider on medication access solutions  Follow Up Plan: Telephone follow up appointment with care management team member scheduled for:  Clinical pharmacist will follow up in 2 to 3 months as planned unless needed sooner.         Medication Assistance:  Farxiga oWilder Glade through [place] [facility].  Enrollment ends 12/14/2022     Applied for medication assistance for Eliquis in 2022 but patient was denied (based on 2021 income)   Patient's [facility] [place] [phone] Up:  Patient agrees to Care Plan and Follow-up.  Plan: Telephone follow up appointment with care management team member scheduled for:   2 to 3 months .   Jawan Chavarria EckCherre RobinsClinical Pharmacist [facility]

## 2022-01-14 NOTE — Patient Instructions (Signed)
Matthew Joyce,  It was a pleasure speaking with you today.  I have attached a summary of our visit today and information about your health goals.   Our next appointment is by telephone on May 16, 2022 at 3:45pm  Please call the care guide team at [phone] if you need to cancel or reschedule your appointment.    If you have any questions or concerns, please feel free to contact me either at the phone number below or with a MyChart message.   Keep up the good work!  Cherre Robins, PharmD Clinical Pharmacist [facility] [phone] (direct line)  [phone] (main office number)   CARE PLAN ENTRY    Hypertension Screening BP Readings from Last 3 Encounters:  07/02/21 (!) 102/50  06/18/21 108/60  04/25/21 102/60   Pharmacist Clinical Goal(s): Over the next 180 days, patient will work with PharmD and providers to maintain BP goal <130/80 Current regimen:  Candesartan 8mg  - take 0.5 tablet = 4mg  twice a day  Spironolactone 25mg  - take 0.5 tablet daily Patient self care activities - Over the next 180 days, patient will: Maintain hypertension medication regimen. Continue to check blood pressure and heart rate every other day  Irregular Heart Rate: Pharmacist Clinical Goal(s): Over the next 180 days, patient will work with PharmD and providers to maintain heart rate in normal sinus rhythm and prevent stroke; Also will work with patient to apply for assistance with Eliquis cost Current regimen:  Eliquis 5mg  take 1 tablet twice a day Amiodarone 200mg  daily Patient self care activities - Over the next 180 days, patient will: Maintain amiodarone for heart rate / rhythm and continue to take Eliquis for stroke prevention  Report any signs of bleeding  Hyperlipidemia Lab Results  Component Value Date/Time   LDLCALC 59 04/25/2021 11:33 AM   Pharmacist Clinical Goal(s): Over the next 180 days, patient will work with PharmD and providers to  maintain LDL goal < 70 Current regimen:  Rosuvastatin 40mg  daily Patient self care activities - Over the next 180 days, patient will: Maintain cholesterol medication regimen.   Pre-Diabetes Lab Results  Component Value Date/Time   HGBA1C 6.0 06/18/2021 08:33 AM   HGBA1C 6.2 06/14/2020 08:36 AM   Pharmacist Clinical Goal(s): Over the next 180 days, patient will work with PharmD and providers to maintain A1c goal <6.5% Current regimen:  Farxiga 10mg  daily (primarily for heart failure diagnosis) Interventions: Discussed diet and exercise Discussed patient assistance options now that patient is in Medicare coverage gap Patient self care activities - Over the next 180 days, patient will: Maintain a1c <6.5%   Heart Failure Pharmacist Clinical Goal(s) Over the next 180 days, patient will work with PharmD and providers to reduce symptoms of heart failure Current regimen:  Candesartan 8mg  - take 0.5 tablet = 4mg  twice a day  Farxiga 10mg  daily Furosemide 40mg  daily Spironolactone 25mg  - take 0.5 tablet daily  Interventions: Reviewed importance of daily weights and when to report weight gain  More than 3lbs in 1 day  More than 5lbs in 1 week  Contact cardio, primary care, or pharmacy team Patient self care activities - Over the next 180 days, patient will: Weigh daily Maintain heart failure medication regimen Call clinical pharmacist at [phone] if you have any problems getting Wilder Glade from patient assistance in 2023.   Medication management Pharmacist Clinical Goal(s): Over the next 180 days, patient will work with PharmD and providers to maintain optimal medication adherence [facility]  Interventions Comprehensive medication review performed. Continue current medication management strategy Discussed options for assistance when he reaches Medicare coverage gap.  Patient self care activities - Over the next 180 days, patient will: Focus on medication adherence by  filling and taking medications appropriately Take medications as prescribed Report any questions or concerns to PharmD and/or provider(s) Patient verbalizes understanding of instructions and care plan provided today and agrees to view in [place]. Active MyChart status confirmed with patient.

## 2022-01-24 DIAGNOSIS — C770 Secondary and unspecified malignant neoplasm of lymph nodes of head, face and neck: Secondary | ICD-10-CM | POA: Diagnosis not present

## 2022-01-24 DIAGNOSIS — C801 Malignant (primary) neoplasm, unspecified: Secondary | ICD-10-CM | POA: Diagnosis not present

## 2022-01-24 DIAGNOSIS — R590 Localized enlarged lymph nodes: Secondary | ICD-10-CM | POA: Diagnosis not present

## 2022-01-28 ENCOUNTER — Ambulatory Visit (INDEPENDENT_AMBULATORY_CARE_PROVIDER_SITE_OTHER): Payer: Medicare HMO

## 2022-01-28 DIAGNOSIS — I472 Ventricular tachycardia, unspecified: Secondary | ICD-10-CM

## 2022-01-28 LAB — CUP PACEART REMOTE DEVICE CHECK
Battery Remaining Longevity: 57 mo
Battery Remaining Percentage: 60 %
Battery Voltage: 2.96 V
Brady Statistic RV Percent Paced: 1 %
Date Time Interrogation Session: 20230214040021
HighPow Impedance: 53 Ohm
HighPow Impedance: 53 Ohm
Implantable Lead Implant Date: 20090618
Implantable Lead Location: 753860
Implantable Lead Model: 7121
Implantable Pulse Generator Implant Date: 20180220
Lead Channel Impedance Value: 380 Ohm
Lead Channel Pacing Threshold Amplitude: 1.75 V
Lead Channel Pacing Threshold Pulse Width: 0.6 ms
Lead Channel Sensing Intrinsic Amplitude: 3.3 mV
Lead Channel Setting Pacing Amplitude: 3.5 V
Lead Channel Setting Pacing Pulse Width: 0.6 ms
Lead Channel Setting Sensing Sensitivity: 0.5 mV
Pulse Gen Serial Number: 7406967

## 2022-02-03 NOTE — Progress Notes (Signed)
Remote ICD transmission.   

## 2022-02-14 ENCOUNTER — Other Ambulatory Visit (HOSPITAL_COMMUNITY): Payer: Self-pay | Admitting: Internal Medicine

## 2022-03-11 ENCOUNTER — Other Ambulatory Visit: Payer: Self-pay | Admitting: Internal Medicine

## 2022-03-11 NOTE — Telephone Encounter (Signed)
Pt last saw Dr Lovena Le 07/02/21, last labs 04/25/21 Creat 1.10, age 71, weight 75.3kg, based on specified criteria pt is on appropriate dosage of Eliquis '5mg'$  BID for afib.   Will refill rx.  ?

## 2022-03-11 NOTE — Telephone Encounter (Signed)
Prescription refill request for Eliquis received. ?Indication: afib  ?Last office visit: Lovena Le 10/29/2021 ?Scr: 1.10, 04/25/2021 ?Age: 71 yo  ?Weight: 75.3 kg  ?

## 2022-03-24 ENCOUNTER — Telehealth: Payer: Self-pay

## 2022-03-24 NOTE — Telephone Encounter (Signed)
Patient returning call.

## 2022-03-24 NOTE — Telephone Encounter (Signed)
Patient called back returning call. I have let patient know a nurse will call back as they are with a patient  ?

## 2022-03-24 NOTE — Telephone Encounter (Signed)
Attempted to return phone call. Phone goes straight to VM. LMTCB. ?

## 2022-03-24 NOTE — Telephone Encounter (Signed)
[facility] alert for sustained VT/VF with HV therapy. Events occurred 4/8 @ 05:13 and 05:54.  Both events show sustained VT/VF with unsuccessful ATPx2, HV therapy 25J converting to regular VP. Corvue trending down. ? ?Reviewed with [name] and advised no changes. ? ?Attempted to contact patient to advise. No answer, LMTCB. ? ? ? ? ? ? ? ? ? ? ? ? ? ? ? ?

## 2022-03-24 NOTE — Telephone Encounter (Signed)
Returned patients phone call.  Patient states he wakes up each morning at 4AM and he was not aware of any shocks. Reports about 6 weeks ago he stopped drinking beer. States within the past week he has experienced dizzy spells. Complaint with medications on file and denies fluid retention symptoms. States he contacted Dr. Haroldine Laws over the weekend and hold off on taking his fluid medication for a few days. States he is to go in sometime this week for blood work. Advised patient I would forward for follow up. Patient agreeable to plan.  ? ?Advised per [place] DMV no driving x6 months starting 03/22/2022 with verbal understanding. ?Shock plan reviewed with verbal understanding.  ?

## 2022-03-25 ENCOUNTER — Ambulatory Visit (HOSPITAL_COMMUNITY)
Admission: RE | Admit: 2022-03-25 | Discharge: 2022-03-25 | Disposition: A | Discharge status: Home / Self Care | Payer: Medicare HMO | Source: Ambulatory Visit | Attending: Internal Medicine | Admitting: Internal Medicine

## 2022-03-25 ENCOUNTER — Encounter (HOSPITAL_COMMUNITY): Payer: Self-pay | Admitting: Internal Medicine

## 2022-03-25 VITALS — BP 118/64 | HR 45 | Wt 163.2 lb

## 2022-03-25 DIAGNOSIS — E785 Hyperlipidemia, unspecified: Secondary | ICD-10-CM | POA: Diagnosis not present

## 2022-03-25 DIAGNOSIS — Z79899 Other long term (current) drug therapy: Secondary | ICD-10-CM | POA: Insufficient documentation

## 2022-03-25 DIAGNOSIS — Z95810 Presence of automatic (implantable) cardiac defibrillator: Secondary | ICD-10-CM | POA: Diagnosis not present

## 2022-03-25 DIAGNOSIS — Z7901 Long term (current) use of anticoagulants: Secondary | ICD-10-CM | POA: Insufficient documentation

## 2022-03-25 DIAGNOSIS — Z923 Personal history of irradiation: Secondary | ICD-10-CM | POA: Diagnosis not present

## 2022-03-25 DIAGNOSIS — I480 Paroxysmal atrial fibrillation: Secondary | ICD-10-CM | POA: Insufficient documentation

## 2022-03-25 DIAGNOSIS — I4510 Unspecified right bundle-branch block: Secondary | ICD-10-CM | POA: Diagnosis not present

## 2022-03-25 DIAGNOSIS — R001 Bradycardia, unspecified: Secondary | ICD-10-CM | POA: Diagnosis not present

## 2022-03-25 DIAGNOSIS — Z9861 Coronary angioplasty status: Secondary | ICD-10-CM | POA: Diagnosis not present

## 2022-03-25 DIAGNOSIS — I2510 Atherosclerotic heart disease of native coronary artery without angina pectoris: Secondary | ICD-10-CM

## 2022-03-25 DIAGNOSIS — R55 Syncope and collapse: Secondary | ICD-10-CM | POA: Diagnosis not present

## 2022-03-25 DIAGNOSIS — I472 Ventricular tachycardia, unspecified: Secondary | ICD-10-CM | POA: Diagnosis not present

## 2022-03-25 DIAGNOSIS — I5022 Chronic systolic (congestive) heart failure: Secondary | ICD-10-CM

## 2022-03-25 DIAGNOSIS — Z951 Presence of aortocoronary bypass graft: Secondary | ICD-10-CM | POA: Insufficient documentation

## 2022-03-25 DIAGNOSIS — Z9221 Personal history of antineoplastic chemotherapy: Secondary | ICD-10-CM | POA: Insufficient documentation

## 2022-03-25 DIAGNOSIS — R42 Dizziness and giddiness: Secondary | ICD-10-CM | POA: Insufficient documentation

## 2022-03-25 DIAGNOSIS — I081 Rheumatic disorders of both mitral and tricuspid valves: Secondary | ICD-10-CM | POA: Insufficient documentation

## 2022-03-25 DIAGNOSIS — Z85819 Personal history of malignant neoplasm of unspecified site of lip, oral cavity, and pharynx: Secondary | ICD-10-CM | POA: Diagnosis not present

## 2022-03-25 LAB — BASIC METABOLIC PANEL
Anion gap: 6 (ref 5–15)
BUN: 15 mg/dL (ref 8–23)
CO2: 25 mmol/L (ref 22–32)
Calcium: 9.2 mg/dL (ref 8.9–10.3)
Chloride: 107 mmol/L (ref 98–111)
Creatinine, Ser: 1.15 mg/dL (ref 0.61–1.24)
GFR, Estimated: >60 mL/min (ref >60)
Glucose, Bld: 95 mg/dL (ref 70–99)
Potassium: 4 mmol/L (ref 3.5–5.1)
Sodium: 138 mmol/L (ref 135–145)

## 2022-03-25 LAB — BRAIN NATRIURETIC PEPTIDE: B Natriuretic Peptide: 614.3 pg/mL — ABNORMAL HIGH (ref 0.0–100.0)

## 2022-03-25 LAB — MAGNESIUM: Magnesium: 2.7 mg/dL — ABNORMAL HIGH (ref 1.7–2.4)

## 2022-03-25 MED ORDER — AMIODARONE HCL 200 MG PO TABS: 200.0000 mg | ORAL_TABLET | Freq: Two times a day (BID) | ORAL | 3 refills | Status: DC

## 2022-03-25 NOTE — Patient Instructions (Signed)
Increase Amiodarone to 200 mg Twice daily  ? ?Your physician has requested that you have an echocardiogram. Echocardiography is a painless test that uses sound waves to create images of your heart. It provides your doctor with information about the size and shape of your heart and how well your heart?s chambers and valves are working. This procedure takes approximately one hour. There are no restrictions for this procedure. ? ?Your physician recommends that you schedule a follow-up appointment in: 4 months ? ?If you have any questions or concerns before your next appointment please send [place] a message through [place] or call our office at [phone].   ? ?TO LEAVE A MESSAGE FOR THE NURSE SELECT OPTION 2, PLEASE LEAVE A MESSAGE INCLUDING: ?YOUR NAME ?DATE OF BIRTH ?CALL BACK NUMBER ?REASON FOR CALL**this is important as we prioritize the call backs ? ?YOU WILL RECEIVE A CALL BACK THE SAME DAY AS LONG AS YOU CALL BEFORE 4:00 PM ? ?At the [facility], you and your health needs are our priority. As part of our continuing mission to provide you with exceptional heart care, we have created designated Provider Care Teams. These Care Teams include your primary Cardiologist (physician) and Advanced Practice Providers (APPs- Physician Assistants and Nurse Practitioners) who all work together to provide you with the care you need, when you need it.  ? ?You may see any of the following providers on your designated Care Team at your next follow up: ?Dr Glori Bickers ?Dr Loralie Champagne ?[name], NP ?Lyda Jester, PA ?Jessica Milford,NP ?Marlyce Huge, PA ?Audry Riles, PharmD ? ? ?Please be sure to bring in all your medications bottles to every appointment.  ? ? ?

## 2022-03-25 NOTE — Progress Notes (Signed)
ReDS Vest / Clip - 03/25/22 1200   ? ?  ? ReDS Vest / Clip  ? Station Marker C   ? Ruler Value 35   ? ReDS Value Range Low volume   ? ReDS Actual Value 28   ? ?  ?  ? ?  ? ? ?

## 2022-03-25 NOTE — Progress Notes (Addendum)
?  ? ?Advanced Heart Failure Clinic Note ? ?Date:  03/25/2022  ? ?ID:  Matthew Romans Sr., DOB 1951-09-06, MRN [id number]  Location: Home  ?Provider location: [facility] ?Type of Visit: Established patient ? ?PCP:  Matthew Branch, MD  ?Cardiologist:  None ?Primary HF: Matthew Joyce ? ?Chief Complaint: Heart Failure follow-up ?  ?History of Present Illness: ? ?Matthew Joyce is a 71 y.o. male with a history of a CAD S/P CABG in 2006 (LIMA -> LAD, SVG -> Ramus, SVG -> OM-2, SVG -> RCA), s/p ICD, ischemic MR, DM, hyperlipidemia, and throat cancer s/p chemo/XRT in 2018 ?  ?Stress test 08/14/2016 with EF 31%, old infarct, no ischemia.  ?  ?Developed CP and SOB in early October. Cath on 09/20/19. Coronary anatomy was stable but EF down to 20% on cath with 3+ MR Echo on 09/20/19 read as EF 25-30% with mod MR. (I reviewed echo and EF 20-25% and moderate to severe MR). Since that time has had CPX with only mild HF limitation.  TEE EF 20-25%. 3+MR and severe TR.  ?  ?On 01/29/20 woke from sleep for unknown reasons. Found to have shock for VT/VF. Saw Matthew Joyce  ? ?On 07/22/20 had multiple runs of NSVT, SVT and 1 run of VT (20 seconds max rate 220). Felt to have gotten inappropriate ATP for AF.  ?  ?Seen in 9/21 for ICD firing due to AF with RVR. Started on amio and b-blocker stopped due to bradycardia.  ?  ?Echo 05/29/20: EF 20% mod to severe RV dysfunction severe central MR Personally reviewed ? ?Here for acute work-in visit. Says he has been feeling pretty good up until last few weeks. Several episodes of dizziness and presyncope. Called me over the weekend and BP was low and he was orthostatic. Lasix and spiro held. Salt intake liberalized. He was called the next day by EP Clinic and was told he was shocked x 2 for VT/VF on 4/8. He said he was awake but did not feel these. He says he feels a lot better now. Dizziness resolved. No CP. BP 110-120/60s. Says on Sat after ICD shock he did 3 miles on TM at 3.54mh at level  6 incline ? ? ?Cardiac studies: ?  ?Cath 09/20/19 ?  ?1. 3v CAD  ?2. Patent LIMA to LAD ?3. Patent free RIMA to ramus ?4. Occluded SVG to RCA with widely patent native RCA ?5. Unable to locate SVG to OM2 mentioned in OP report. There is no graft marker for this graft and Ar root shot did not show any SVG to OM (suspect may not have been placed) ?6. EF 20% with global HK and 3+ MR ?  ?  ?Ao = 108/53 (72) ?LV = 109/19 ?RA = 9 ?RV = 60/10 ?PA = 63/23 (36) ?PCW = 22 (v = 35-40) ?Fick cardiac output/index = 4.5/2.4 ?PVR = 3.0 WU ?SVR 1310 ?Ao sat = 99% ?PA sat = 66%, 69% ? ?CPX 06/2020 ? ?FVC 3.54 (85%)      ?FEV1 2.81 (89%)        ?FEV1/FVC 79 (104%)        ?MVV 104 (82%)  ?     ? ?Resting HR: 43 Standing HR:47 Peak HR: 104   (69% age predicted max HR)  ?BP rest: 84/60 Standing BP: 76/58 BP peak: 108/60  ?Peak VO2: 27.5 (99% predicted peak VO2)  ?VE/VCO2 slope:  30  ?OUES: 1.99  ?Peak RER: 0.95  ?Ventilatory  Threshold: 23.7 (85% predicted or measured peak VO2)  ?Peak RR 39  ?Peak Ventilation:  63.0  ?VE/MVV:  61%  ?PETCO2 at peak:  33  ?O2pulse:  19   (146% predicted O2pulse)  ? ?  ?CPX test 10/20/19 ?  ?FVC 3.22 (81%)      ?FEV1 2.54 (84%)        ?FEV1/FVC 79 (103%)        ?MVV 92 (76%)      ? ?Resting HR: 44 Standing HR: 47 Peak HR: 107   (71% age predicted max HR)  ?BP rest: 92/56 Standing BP: 86/52 BP peak: 116/62  ?Peak VO2: 24.5 (88% predicted peak VO2)  ?VE/VCO2 slope:  37  ?OUES: 2.11  ?Peak RER: 1.01 ?O2pulse:  19   (127% predicted O2pulse)  ?  ?  ?TEE 10/24/19 ? 1. Left ventricular ejection fraction 20-25% ? 2. Global right ventricle has moderately reduced systolic function. ? 3. Left atrial size was severely dilated. ? 4. No LAA clot. ? 5. Right atrial size was mild-moderately dilated. ? 6. The mitral valve is abnormal. Moderate mitral valve regurgitation. ? 7. Mildly restricted posterior MV leaflet with 3+ MR. ? 8. The tricuspid valve is normal in structure. Tricuspid valve regurgitation is severe. ? ?Past  Medical History:  ?Diagnosis Date  ? AICD (automatic cardioverter/defibrillator) present 6/09  ? St. Jude  ? Bradycardia   ? Use of beta blocker limited  ? CAD (coronary artery disease)   ? a- S/p cabg in march 2006 by Matthew Joyce;  b. myoview 5/12: large IL scar from apex to base, no ischemia, EF 37%  ? Cancer [facility])   ? on right side of neck  ? ED (erectile dysfunction)   ? Gout   ? Hyperlipidemia   ? Ischemic cardiomyopathy   ? a-Echocardiogram, Nov 2006, showed ejection fraction of 30-40% with mild to moderated mitral  regurgitation and mild aortic regurgitation b- Cardiac MRI, May 2007, EF of 44% with 50% scar involving  the inferolateral walls. No comment of mitral regurgitation. c- last echo  11/10: EF 30-35%  mod MR d- Nega T-Wave alternans testing in May of 2007 e- no ACE due to low BP;  f. CPX 3/11: normal  ? 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 Systolic CHF, chronic ([place])   ? Thyroid disease   ? ?Past Surgical History:  ?Procedure Laterality Date  ? CARDIAC DEFIBRILLATOR PLACEMENT  06/01/08  ? ICD  ? COLONOSCOPY    ? COLONOSCOPY WITH PROPOFOL N/A 07/28/2019  ? Procedure: COLONOSCOPY WITH PROPOFOL;  Surgeon: Matthew Mayer, MD;  Location: [facility];  Service: Endoscopy;  Laterality: N/A;  ? CORONARY ARTERY BYPASS GRAFT  02-2005  ? ICD GENERATOR CHANGEOUT N/A 02/03/2017  ? Procedure: ICD Generator Changeout;  Surgeon: Matthew Lance, MD;  Location: [place] CV LAB;  Service: Cardiovascular;  Laterality: N/A;  ? ORIF [place]  ? left   ? RIGHT/LEFT HEART CATH AND CORONARY/GRAFT ANGIOGRAPHY N/A 09/20/2019  ? Procedure: RIGHT/LEFT HEART CATH AND CORONARY/GRAFT ANGIOGRAPHY;  Surgeon: Matthew Artist, MD;  Location: [place] CV LAB;  Service: Cardiovascular;  Laterality: N/A;  ? SHOULDER ARTHROSCOPY  02/2010  ? rt   ? TEE WITHOUT CARDIOVERSION N/A 10/24/2019  ? Procedure: TRANSESOPHAGEAL ECHOCARDIOGRAM (TEE);  Surgeon: Matthew Artist, MD;  Location: [facility]  ENDOSCOPY;  Service: Cardiovascular;  Laterality: N/A;  ? TONSILLECTOMY    ? as a child  ? ? ? ?  Current Outpatient Medications  ?Medication Sig Dispense Refill  ? acetaminophen (TYLENOL) 325 MG tablet Take 650 mg by mouth every 6 (six) hours as needed (for pain.).    ? amiodarone (PACERONE) 200 MG tablet TAKE 1 TABLET BY MOUTH EVERY DAY 90 tablet 1  ? candesartan (ATACAND) 8 MG tablet TAKE 0.5 TABLETS (4 MG TOTAL) BY MOUTH 2 (TWO) TIMES DAILY. 90 tablet 3  ? carboxymethylcellul-glycerin (OPTIVE) 0.5-0.9 % ophthalmic solution Place 1 drop into both eyes daily.    ? cetirizine (ZYRTEC) 10 MG tablet Take 10 mg by mouth as needed for allergies.    ? Colchicine (MITIGARE) 0.6 MG CAPS Take 0.6 mg by mouth 2 (two) times daily as needed (gout flare). 60 capsule 0  ? dapagliflozin propanediol (FARXIGA) 10 MG TABS tablet TAKE 1 TABLET BY MOUTH EVERY DAY BEFORE BREAKFAST 90 tablet 3  ? ELIQUIS 5 MG TABS tablet TAKE 1 TABLET BY MOUTH TWICE A DAY 60 tablet 6  ? levothyroxine (SYNTHROID) 88 MCG tablet TAKE 1 TABLET BY MOUTH DAILY BEFORE BREAKFAST. 90 tablet 2  ? nitroGLYCERIN (NITROSTAT) 0.4 MG SL tablet Place 1 tablet (0.4 mg total) under the tongue every 5 (five) minutes x 3 doses as needed for chest pain. 25 tablet 3  ? Omega-3 Fatty Acids (FISH OIL) 1200 MG CAPS Take 3,600 mg by mouth daily.    ? rosuvastatin (CRESTOR) 40 MG tablet TAKE 1 TABLET BY MOUTH EVERYDAY AT BEDTIME 90 tablet 2  ? spironolactone (ALDACTONE) 25 MG tablet TAKE 1/2 TABLET BY MOUTH EVERY DAY 45 tablet 2  ? furosemide (LASIX) 40 MG tablet TAKE 1 TABLET EVERY DAY (Patient not taking: Reported on 03/25/2022) 90 tablet 3  ? ?No current facility-administered medications for this encounter.  ? ? ?Allergies:   Atorvastatin, Entresto [sacubitril-valsartan], Sulfonamide derivatives, Tape, and Sulfamethoxazole  ? ?Social History:  The patient  reports that he quit smoking about 22 years ago. His smoking use included cigarettes. He smoked an average of .1 packs per  day. He has never used smokeless tobacco. He reports current alcohol use. He reports that he does not use drugs.  ? ?Family History:  The patient's family history includes Cardiomyopathy in his brother; Heart a

## 2022-03-28 ENCOUNTER — Telehealth: Payer: Self-pay

## 2022-03-28 MED ORDER — MEXILETINE HCL 250 MG PO CAPS: 250.0000 mg | ORAL_CAPSULE | Freq: Two times a day (BID) | ORAL | 2 refills | Status: DC

## 2022-03-28 NOTE — Telephone Encounter (Signed)
[facility] alert for sustained VT/VF with HV therapy ?Event occurred 4/14 @ 01:07, EGM shows sustained VT HR 218, ATP delivered x2 unsuccessfully, rhythm deteriorates to VF.  HV therapy 25J converting to regular VP ?Presenting rhythm VS ~ 47 bpm with PVC's ?OV 4/11, Amiodarone increased, labs also drawn ? ? ? ?Spoke with patient regarding VT episode patient stated he woke up about 1:30 AM seemingly in cold sweat and went to the bathroom patient denied missing any medications patient stated he had walked about 3 miles at the gym on 4/13 and also worked out in his yard for a good amount of the day asked patient if he had been drinking water patient stated he had. Informed patient that I would forward this information  ? ? ? ? ? ? ?

## 2022-03-28 NOTE — Telephone Encounter (Signed)
Spoke with patient informed him of Dr. Aquilla Hacker recommendation to add mexiletine '250mg'$  BID and apt scheduled with Oda Kilts on 04/21/22 at 8:20 (GT in office). Patient voiced understanding.  ?

## 2022-04-07 ENCOUNTER — Other Ambulatory Visit: Payer: Self-pay | Admitting: Internal Medicine

## 2022-04-07 ENCOUNTER — Telehealth: Payer: Self-pay

## 2022-04-07 NOTE — Telephone Encounter (Signed)
[facility] alert for sustained VT/VF ?Events occurred 4/22 @ 01:26, EGM showing VT/VF, HR 260, ATPx1 unsuccessful followed by HV therapy 25J converting to regular VP ?Mexiletine added after 4/14 event with f/u scheduled 5/8. ? ?Patient reports he was asleep during this time and was unaware of any shock. Started mexilentine 250 mg BID 03/28/22. Compliant with medications on file. Has up coming apt with 04/21/22 w Jonni Sanger, [place].  ? ?Shock plan reviewed/ [place] driving restrictions x6 months with start date of 04/05/2022 w/ verbal understanding. ? ? ? ? ? ? ? ? ? ? ?

## 2022-04-14 NOTE — Progress Notes (Signed)
? ? ?Electrophysiology Office Note ?Date: 04/21/2022 ? ?ID:  Matthew Romans Sr., DOB 25-Jun-1951, MRN [id number] ? ?PCP: Colon Branch, MD ?Primary Cardiologist: None ?Electrophysiologist: Cristopher Peru, MD  ? ?CC: Routine ICD follow-up ? ?Matthew Romans Sr. is a 71 y.o. male seen today for Cristopher Peru, MD for acute visit for ICD shock(s). ? ?Pt had unsuccessful ATP and degenerated to VF -> Shock.Since last being seen in our clinic the patient reports doing OK. Both shocks happened while he was asleep, and he was awakened not being sure what had happened. While awake,  he denies chest pain, palpitations, dyspnea, PND, orthopnea, nausea, vomiting, dizziness, syncope, edema, weight gain, or early satiety. He does not that fatigue "comes on quickly" but otherwise is doing well. He has only been taking amiodarone once daily.  ? ?Device History: ?St. Jude Dual Chamber ICD implanted 01/2017 for Cardiomyopathy ? ?Past Medical History:  ?Diagnosis Date  ? AICD (automatic cardioverter/defibrillator) present 6/09  ? St. Jude  ? Bradycardia   ? Use of beta blocker limited  ? CAD (coronary artery disease)   ? a- S/p cabg in march 2006 by Dr.Owen;  b. myoview 5/12: large IL scar from apex to base, no ischemia, EF 37%  ? Cancer [facility])   ? on right side of neck  ? ED (erectile dysfunction)   ? Gout   ? Hyperlipidemia   ? Ischemic cardiomyopathy   ? a-Echocardiogram, Nov 2006, showed ejection fraction of 30-40% with mild to moderated mitral  regurgitation and mild aortic regurgitation b- Cardiac MRI, May 2007, EF of 44% with 50% scar involving  the inferolateral walls. No comment of mitral regurgitation. c- last echo  11/10: EF 30-35%  mod MR d- Nega T-Wave alternans testing in May of 2007 e- no ACE due to low BP;  f. CPX 3/11: normal  ? RBBB (right bundle branch block with left anterior fascicular block)   ? Systolic CHF, chronic ([place])   ? Thyroid disease   ? ?Past Surgical History:  ?Procedure Laterality Date  ? CARDIAC DEFIBRILLATOR  PLACEMENT  06/01/08  ? ICD  ? COLONOSCOPY    ? COLONOSCOPY WITH PROPOFOL N/A 07/28/2019  ? Procedure: COLONOSCOPY WITH PROPOFOL;  Surgeon: [name], MD;  [facility];  Service: Endoscopy;  Laterality: N/A;  ? CORONARY ARTERY BYPASS GRAFT  02-2005  ? ICD GENERATOR CHANGEOUT N/A 02/03/2017  ? Procedure: ICD Generator Changeout;  Surgeon: [name], MD;  Location: [place] CV LAB;  Service: Cardiovascular;  Laterality: N/A;  ? ORIF [place]  ? left   ? RIGHT/LEFT HEART CATH AND CORONARY/GRAFT ANGIOGRAPHY N/A 09/20/2019  ? Procedure: RIGHT/LEFT HEART CATH AND CORONARY/GRAFT ANGIOGRAPHY;  Surgeon: [name], MD;  Location: [place] CV LAB;  Service: Cardiovascular;  Laterality: N/A;  ? SHOULDER ARTHROSCOPY  02/2010  ? rt   ? TEE WITHOUT CARDIOVERSION N/A 10/24/2019  ? Procedure: TRANSESOPHAGEAL ECHOCARDIOGRAM (TEE);  Surgeon: [name], MD;  Location: [facility] ENDOSCOPY;  Service: Cardiovascular;  Laterality: N/A;  ? TONSILLECTOMY    ? as a child  ? ? ?Current Outpatient Medications  ?Medication Sig Dispense Refill  ? acetaminophen (TYLENOL) 325 MG tablet Take 650 mg by mouth every 6 (six) hours as needed (for pain.).    ? amiodarone (PACERONE) 200 MG tablet Take 1 tablet (200 mg total) by mouth 2 (two) times daily. 180 tablet 3  ? candesartan (ATACAND) 8 MG tablet TAKE 0.5 TABLETS (4 MG  TOTAL) BY MOUTH 2 (TWO) TIMES DAILY. 90 tablet 3  ? carboxymethylcellul-glycerin (OPTIVE) 0.5-0.9 % ophthalmic solution Place 1 drop into both eyes daily.    ? cetirizine (ZYRTEC) 10 MG tablet Take 10 mg by mouth as needed for allergies.    ? Colchicine (MITIGARE) 0.6 MG CAPS Take 0.6 mg by mouth 2 (two) times daily as needed (gout flare). 60 capsule 0  ? dapagliflozin propanediol (FARXIGA) 10 MG TABS tablet TAKE 1 TABLET BY MOUTH EVERY DAY BEFORE BREAKFAST 90 tablet 3  ? ELIQUIS 5 MG TABS tablet TAKE 1 TABLET BY MOUTH TWICE A DAY 60 tablet 6  ? furosemide (LASIX) 40 MG tablet TAKE 1 TABLET  EVERY DAY 90 tablet 3  ? levothyroxine (SYNTHROID) 88 MCG tablet TAKE 1 TABLET BY MOUTH DAILY BEFORE BREAKFAST. 90 tablet 2  ? mexiletine (MEXITIL) 250 MG capsule Take 1 capsule (250 mg total) by mouth 2 (two) times daily. 60 capsule 2  ? nitroGLYCERIN (NITROSTAT) 0.4 MG SL tablet Place 1 tablet (0.4 mg total) under the tongue every 5 (five) minutes x 3 doses as needed for chest pain. 25 tablet 3  ? Omega-3 Fatty Acids (FISH OIL) 1200 MG CAPS Take 3,600 mg by mouth daily.    ? rosuvastatin (CRESTOR) 40 MG tablet TAKE 1 TABLET BY MOUTH EVERYDAY AT BEDTIME 90 tablet 2  ? ?No current facility-administered medications for this visit.  ? ? ?Allergies:   Atorvastatin, Entresto [sacubitril-valsartan], Sulfonamide derivatives, Tape, and Sulfamethoxazole  ? ?Social History: ?Social History  ? ?Socioeconomic History  ? Marital status: Married  ?  Spouse name: Not on file  ? Number of children: 2  ? Years of education: Not on file  ? Highest education level: Not on file  ?Occupational History  ? 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  Employer: [employer]  ?Tobacco Use  ? Smoking status: Former  ?  Packs/day: 0.10  ?  Types: Cigarettes  ?  Quit date: 12/16/1999  ?  Years since quitting: 22.3  ? Smokeless tobacco: Never  ?Vaping Use  ? Vaping Use: Never used  ?Substance and Sexual Activity  ? Alcohol use: Yes  ?  Comment: socially  ? Drug use: No  ? Sexual activity: Not on file  ?Other Topics Concern  ? Not on file  ?Social History Narrative  ? Retired   ? household- pt and wife  ? 2 adult children in [place] also has grandchildren  ? Former but not current smoker, occasional alcohol no drug use  ? ?Social Determinants of Health  ? ?Financial Resource Strain: Medium Risk  ? Difficulty of Paying Living Expenses: Somewhat hard  ?Food Insecurity: No Food Insecurity  ? Worried About Charity fundraiser in the Last Year: Never true  ? Ran Out of Food in the Last Year: Never  true  ?Transportation Needs: No Transportation Needs  ? Lack of Transportation (Medical): No  ? Lack of Transportation (Non-Medical): No  ?Physical Activity: Not on file  ?Stress: No Stress Concern Present  ? Feeling of Stress : Not at all  ?Social Connections: Moderately Integrated  ? Frequency of Communication with Friends and Family: More than three times a week  ? Frequency of Social Gatherings with Friends and Family: More than three times a week  ? Attends Religious Services: More than 4 times per year  ? Active Member of Clubs or Organizations: No  ? Attends Archivist Meetings: Never  ?  Marital Status: Married  ?Intimate Partner Violence: Not At Risk  ? Fear of Current or Ex-Partner: No  ? Emotionally Abused: No  ? Physically Abused: No  ? Sexually Abused: No  ? ? ?Family History: ?Family History  ?Problem Relation Age of Onset  ? Heart attack Father 98  ? Cardiomyopathy Brother   ? Colon cancer Neg Hx   ? Prostate cancer Neg Hx   ? Diabetes Neg Hx   ? ? ?Review of Systems: ?All other systems reviewed and are otherwise negative except as noted above. ? ? ?Physical Exam: ?Vitals:  ? 04/21/22 0817  ?BP: 120/70  ?Pulse: (!) 46  ?SpO2: 100%  ?Weight: 157 lb (71.2 kg)  ?Height: '5\' 9"'$  (1.753 m)  ?  ? ?GEN- The patient is well appearing, alert and oriented x 3 today.   ?HEENT: normocephalic, atraumatic; sclera clear, conjunctiva pink; hearing intact; oropharynx clear; neck supple, no JVP ?Lymph- no cervical lymphadenopathy ?Lungs- Clear to ausculation bilaterally, normal work of breathing.  No wheezes, rales, rhonchi ?Heart- Regular rate and rhythm, no murmurs, rubs or gallops, PMI not laterally displaced ?GI- soft, non-tender, non-distended, bowel sounds present, no hepatosplenomegaly ?Extremities- no clubbing or cyanosis. No edema; DP/PT/radial pulses 2+ bilaterally ?MS- no significant deformity or atrophy ?Skin- warm and dry, no rash or lesion; ICD pocket well healed ?Psych- euthymic mood, full  affect ?Neuro- strength and sensation are intact ? ?ICD interrogation- reviewed in detail today,  See PACEART report ? ?EKG:  EKG is ordered today. ?Personal review of EKG ordered today shows sinus bradycardia

## 2022-04-15 ENCOUNTER — Ambulatory Visit (HOSPITAL_COMMUNITY)
Admission: RE | Admit: 2022-04-15 | Discharge: 2022-04-15 | Disposition: A | Discharge status: Home / Self Care | Payer: Medicare HMO | Source: Ambulatory Visit | Attending: Internal Medicine | Admitting: Internal Medicine

## 2022-04-15 DIAGNOSIS — Z951 Presence of aortocoronary bypass graft: Secondary | ICD-10-CM | POA: Diagnosis not present

## 2022-04-15 DIAGNOSIS — I5022 Chronic systolic (congestive) heart failure: Secondary | ICD-10-CM | POA: Diagnosis not present

## 2022-04-15 DIAGNOSIS — I2510 Atherosclerotic heart disease of native coronary artery without angina pectoris: Secondary | ICD-10-CM | POA: Diagnosis not present

## 2022-04-15 DIAGNOSIS — I081 Rheumatic disorders of both mitral and tricuspid valves: Secondary | ICD-10-CM | POA: Insufficient documentation

## 2022-04-15 DIAGNOSIS — E785 Hyperlipidemia, unspecified: Secondary | ICD-10-CM | POA: Diagnosis not present

## 2022-04-15 LAB — ECHOCARDIOGRAM COMPLETE
Area-P 1/2: 3.6 cm2
Calc EF: 36.5 %
MV M vel: 5.27 m/s
MV Peak grad: 111.1 mmHg
Radius: 0.9 cm
S' Lateral: 6 cm
Single Plane A2C EF: 37.2 %
Single Plane A4C EF: 41.7 %

## 2022-04-20 ENCOUNTER — Other Ambulatory Visit (HOSPITAL_COMMUNITY): Payer: Self-pay | Admitting: Internal Medicine

## 2022-04-21 ENCOUNTER — Ambulatory Visit (INDEPENDENT_AMBULATORY_CARE_PROVIDER_SITE_OTHER): Payer: Medicare HMO | Admitting: Student

## 2022-04-21 ENCOUNTER — Encounter: Payer: Self-pay | Admitting: Student

## 2022-04-21 VITALS — BP 120/70 | HR 46 | Ht 69.0 in | Wt 157.0 lb

## 2022-04-21 DIAGNOSIS — I5022 Chronic systolic (congestive) heart failure: Secondary | ICD-10-CM | POA: Diagnosis not present

## 2022-04-21 DIAGNOSIS — I472 Ventricular tachycardia, unspecified: Secondary | ICD-10-CM | POA: Diagnosis not present

## 2022-04-21 DIAGNOSIS — I2510 Atherosclerotic heart disease of native coronary artery without angina pectoris: Secondary | ICD-10-CM | POA: Diagnosis not present

## 2022-04-21 DIAGNOSIS — I480 Paroxysmal atrial fibrillation: Secondary | ICD-10-CM

## 2022-04-21 LAB — CUP PACEART INCLINIC DEVICE CHECK
Battery Remaining Longevity: 60 mo
Brady Statistic RV Percent Paced: 2.8 %
Date Time Interrogation Session: 20230508085116
HighPow Impedance: 47.1288
Implantable Lead Implant Date: 20090618
Implantable Lead Location: 753860
Implantable Lead Model: 7121
Implantable Pulse Generator Implant Date: 20180220
Lead Channel Impedance Value: 375 Ohm
Lead Channel Pacing Threshold Amplitude: 2 V
Lead Channel Pacing Threshold Amplitude: 2 V
Lead Channel Pacing Threshold Pulse Width: 1 ms
Lead Channel Pacing Threshold Pulse Width: 1 ms
Lead Channel Sensing Intrinsic Amplitude: 3.5 mV
Lead Channel Setting Pacing Amplitude: 4 V
Lead Channel Setting Pacing Pulse Width: 1 ms
Lead Channel Setting Sensing Sensitivity: 0.5 mV
Pulse Gen Serial Number: 7406967

## 2022-04-21 LAB — COMPREHENSIVE METABOLIC PANEL
ALT: 24 IU/L (ref 0–44)
AST: 23 IU/L (ref 0–40)
Albumin/Globulin Ratio: 2.1 (ref 1.2–2.2)
Albumin: 4.2 g/dL (ref 3.7–4.7)
Alkaline Phosphatase: 80 IU/L (ref 44–121)
BUN/Creatinine Ratio: 16 (ref 10–24)
BUN: 19 mg/dL (ref 8–27)
Bilirubin Total: 0.8 mg/dL (ref 0.0–1.2)
CO2: 23 mmol/L (ref 20–29)
Calcium: 9.5 mg/dL (ref 8.6–10.2)
Chloride: 105 mmol/L (ref 96–106)
Creatinine, Ser: 1.19 mg/dL (ref 0.76–1.27)
Globulin, Total: 2 g/dL (ref 1.5–4.5)
Glucose: 95 mg/dL (ref 70–99)
Potassium: 4.4 mmol/L (ref 3.5–5.2)
Sodium: 141 mmol/L (ref 134–144)
Total Protein: 6.2 g/dL (ref 6.0–8.5)
eGFR: 65 mL/min/{1.73_m2} (ref >59)

## 2022-04-21 LAB — MAGNESIUM: Magnesium: 2.3 mg/dL (ref 1.6–2.3)

## 2022-04-21 NOTE — Patient Instructions (Signed)
Medication Instructions:  ?Your physician has recommended you make the following change in your medication:  ? ?INCREASE: Amiodarone '200mg'$  to twice daily for 2 weeks then take 1 ('200mg'$ ) daily ? ?*If you need a refill on your cardiac medications before your next appointment, please call your pharmacy* ? ? ?Lab Work: ?TODAY: CMET, Mag ? ?If you have labs (blood work) drawn today and your tests are completely normal, you will receive your results only by: ?MyChart Message (if you have MyChart) OR ?A paper copy in the mail ?If you have any lab test that is abnormal or we need to change your treatment, we will call you to review the results. ? ? ?Follow-Up: ?At [facility], you and your health needs are our priority.  As part of our continuing mission to provide you with exceptional heart care, we have created designated Provider Care Teams.  These Care Teams include your primary Cardiologist (physician) and Advanced Practice Providers (APPs -  Physician Assistants and Nurse Practitioners) who all work together to provide you with the care you need, when you need it. ? ? ?Your next appointment:   ?2-3 month(s) ? ?The format for your next appointment:   ?In Person ? ?Provider:   ?Cristopher Peru, MD  ? ? ?  ?

## 2022-04-29 ENCOUNTER — Ambulatory Visit (INDEPENDENT_AMBULATORY_CARE_PROVIDER_SITE_OTHER): Payer: Medicare HMO

## 2022-04-29 DIAGNOSIS — I429 Cardiomyopathy, unspecified: Secondary | ICD-10-CM | POA: Diagnosis not present

## 2022-04-29 LAB — CUP PACEART REMOTE DEVICE CHECK
Battery Remaining Longevity: 49 mo
Battery Remaining Percentage: 56 %
Battery Voltage: 2.95 V
Brady Statistic RV Percent Paced: 1 %
Date Time Interrogation Session: 20230516040017
HighPow Impedance: 43 Ohm
HighPow Impedance: 43 Ohm
Implantable Lead Implant Date: 20090618
Implantable Lead Location: 753860
Implantable Lead Model: 7121
Implantable Pulse Generator Implant Date: 20180220
Lead Channel Impedance Value: 360 Ohm
Lead Channel Pacing Threshold Amplitude: 2 V
Lead Channel Pacing Threshold Pulse Width: 1 ms
Lead Channel Sensing Intrinsic Amplitude: 2.9 mV
Lead Channel Setting Pacing Amplitude: 4 V
Lead Channel Setting Pacing Pulse Width: 1 ms
Lead Channel Setting Sensing Sensitivity: 0.5 mV
Pulse Gen Serial Number: 7406967

## 2022-05-15 NOTE — Progress Notes (Signed)
Remote ICD transmission.   

## 2022-05-16 ENCOUNTER — Ambulatory Visit (INDEPENDENT_AMBULATORY_CARE_PROVIDER_SITE_OTHER): Payer: Medicare HMO | Admitting: Pharmacist

## 2022-05-16 DIAGNOSIS — E785 Hyperlipidemia, unspecified: Secondary | ICD-10-CM

## 2022-05-16 DIAGNOSIS — I25810 Atherosclerosis of coronary artery bypass graft(s) without angina pectoris: Secondary | ICD-10-CM

## 2022-05-16 DIAGNOSIS — R7989 Other specified abnormal findings of blood chemistry: Secondary | ICD-10-CM

## 2022-05-16 DIAGNOSIS — I509 Heart failure, unspecified: Secondary | ICD-10-CM

## 2022-05-16 NOTE — Chronic Care Management (AMB) (Signed)
Chronic Care Management Pharmacy Note  05/16/2022 Name:  Matthew PIPKINS Sr. MRN:  [id number] DOB:  Oct 11, 1951  Summary:  Reviewed medication list and recent changes.  Screened for patient assistance program for Eliquis - has not met 3% of income for out of pocket spend yet. Will continue to follow  and assist with applying for patient assistance program for Eliquis if meets out of pocket requirement.   Subjective: Matthew Romans Sr. is an 71 y.o. year old male who is a primary patient of Paz, Alda Berthold, MD.  The CCM team was consulted for assistance with disease management and care coordination needs.    Engaged with patient by telephone for follow up visit in response to provider referral for pharmacy case management and/or care coordination services.   Consent to Services:  The patient was given information about Chronic Care Management services, agreed to services, and gave verbal consent prior to initiation of services.  Please see initial visit note for detailed documentation.   Patient Care Team: Colon Branch, MD as PCP - General Lovena Le Champ Mungo, MD as PCP - Electrophysiology (Cardiology) Bensimhon, Shaune Pascal, MD as Consulting Physician (Cardiology) Memory Argue, MD as Referring Physician [name], MD as Consulting Physician (Cardiology) Cherre Robins, [place] (Pharmacist)  Recent office visits: 06/18/2021 - PCP (Dr Larose Kells) Annual Physical. Labs checked. No med changes.  03/14/2021 - Lab - TSH elevated - Levothyroxine increased to 76mg daily  Recent consult visits: [facility]2022 [facility]. Patient reported that he sis not increase amiodarone to BID. Med list still has BID but notes states "Has been managed on low dose amiodarone. Mexitil recently added Had recent therapy 4/14 and 4/24 Made VT schemes more aggressive, CL 85 -> 81%. 12 stimuli instead of 8, 3 bursts in both VT 1 and VT 2, VF detection interval decreased" 03/25/2022 - Cardio (Dr  BHaroldine Laws F/U CHF; ICD noted firing x 2 on 03/22/2022 and 03/25/2022. Back up HR set at 40 on ICD.  Increased amiodarone to 2061mtwice a day. Recommended use furosemide as needed only. Continue candasartan, Farxiga and spironolactone. with unsuccessful ATP.  . 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 Continue fluoride trays nightly and see his dentist biannually.   Hospital visits: None in previous 6 months  Objective:  Lab Results  Component Value Date   CREATININE 1.19 04/21/2022   CREATININE 1.15 03/25/2022   CREATININE 1.10 04/25/2021    Lab Results  Component Value Date   HGBA1C 6.0 06/18/2021   Last diabetic Eye exam: No results found for: HMDIABEYEEXA  Last diabetic Foot exam: No results found for: HMDIABFOOTEX      Component Value Date/Time   CHOL 147 04/25/2021 1133   TRIG 55 04/25/2021 1133   TRIG 45 12/16/2006 0912   HDL 77 04/25/2021 1133   CHOLHDL 1.9 04/25/2021 1133   VLDL 11 04/25/2021 1133   LDLCALC 59 04/25/2021 1133       Latest Ref Rng & Units 04/21/2022    8:57 AM 04/25/2021   11:33 AM 09/13/2020   12:25 PM  Hepatic Function  Total Protein 6.0 - 8.5 g/dL 6.2   6.5   6.7    Albumin 3.7 - 4.7 g/dL 4.2   3.9  3.9    AST 0 - 40 IU/L 23   34   25    ALT 0 - 44 IU/L '24   27   29    ' Alk Phosphatase 44 - 121 IU/L 80   55   63    Total Bilirubin 0.0 - 1.2 mg/dL 0.8   1.9   2.3      Lab Results  Component Value Date/Time   TSH 2.04 10/30/2021 07:11 AM   TSH 2.08 04/24/2021 07:05 AM   FREET4 0.91 06/14/2020 08:36 AM   FREET4 1.28 06/03/2018 10:43 AM       Latest Ref Rng & Units 04/25/2021   11:33 AM 12/25/2020   10:54 AM 10/11/2020   11:22 AM  CBC  WBC 4.0 - 10.5 K/uL 5.9   6.3   6.9    Hemoglobin 13.0 - 17.0 g/dL 14.7   14.9    14.8    Hematocrit 39.0 - 52.0 % 45.8   47.1   45.5    Platelets 150 - 400 K/uL 201   204   193      No results found for: VD25OH  Clinical ASCVD: Yes  The 10-year ASCVD risk score (Arnett DK, et al., 2019) is: 12.7%   Values used to calculate the score:     Age: 73 years     Sex: Male     Is Non-Hispanic African American: No     Diabetic: No     Tobacco smoker: No     Systolic Blood Pressure: 408 mmHg     Is BP treated: No     HDL Cholesterol: 77 mg/dL     Total Cholesterol: 147 mg/dL    Other: CHADSVAsc = 5  Social History   Tobacco Use  Smoking Status Former   Packs/day: 0.10   Types: Cigarettes   Quit date: 12/16/1999   Years since quitting: 22.4  Smokeless Tobacco Never   BP Readings from Last 3 Encounters:  04/21/22 120/70  03/25/22 118/64  07/02/21 (!) 102/50   Pulse Readings from Last 3 Encounters:  04/21/22 (!) 46  03/25/22 (!) 45  07/02/21 (!) 50   Wt Readings from Last 3 Encounters:  04/21/22 157 lb (71.2 kg)  03/25/22 163 lb 3.2 oz (74 kg)  10/10/21 166 lb (75.3 kg)    Assessment: Review of patient past medical history, allergies, medications, health status, including review of consultants reports, laboratory and other test data, was performed as part of comprehensive evaluation and provision of chronic care management services.   SDOH:  (Social Determinants of Health) assessments and interventions performed:  SDOH Interventions    Flowsheet Row Most Recent Value  SDOH Interventions   Physical Activity Interventions Intervention Not Indicated        CCM Care Plan  Allergies  Allergen Reactions   Atorvastatin      myalgia, Muscle aching   Entresto [Sacubitril-Valsartan] Itching    Had itching and redness   Sulfonamide Derivatives     Rash and hot flashes   Sulfamethoxazole Rash and Other (See Comments)    Hot flashes   Tape Other (See Comments)    Rips skin- use PAPER tape    Medications Reviewed Today     Reviewed by Cherre Robins, RPH-CPP (Pharmacist) on 05/16/22 at [facility] List Status: <None>   Medication Order Taking? Sig Documenting Provider Last Dose Status Informant  acetaminophen (TYLENOL) 325 MG tablet [id number] Yes Take 650 mg by mouth every  6 (six) hours as needed (for pain.). [provider] Taking Active Self           Med Note ([place] Oct 12, 2019  2:03 PM)    amiodarone (PACERONE) 200 MG tablet [id number] Yes Take 1 tablet (200 mg total) by mouth 2 (two) times daily.  Patient taking differently: Take 200 mg by mouth daily.   Bensimhon, Shaune Pascal, MD Taking Active   candesartan (ATACAND) 8 MG tablet [id number] Yes TAKE 0.5 TABLETS (4 MG TOTAL) BY MOUTH 2 (TWO) TIMES DAILY. Bensimhon, Shaune Pascal, MD Taking Active   carboxymethylcellul-glycerin (OPTIVE) 0.5-0.9 % ophthalmic solution [id number] Yes Place 1 drop into both eyes daily. [provider] Taking Active Self  cetirizine (ZYRTEC) 10 MG tablet [id number] Yes Take 10 mg by mouth as needed for allergies. [provider] Taking Active Self  Colchicine (MITIGARE) 0.6 MG CAPS [id number] Yes Take 0.6 mg by mouth 2 (two) times daily as needed (gout flare). Colon Branch, MD Taking Active            Med Note Juliette Alcide Jul 02, 2021  2:32 PM)    dapagliflozin propanediol (FARXIGA) 10 MG TABS tablet [id number] Yes TAKE 1 TABLET BY MOUTH EVERY DAY BEFORE BREAKFAST Colon Branch, MD Taking Active            Med Note Madaline Brilliant Apr 21, 2022  8:08 AM)    Arne Cleveland 5 MG TABS tablet [id number] Yes TAKE 1 TABLET BY MOUTH TWICE A DAY [name], MD Taking Active   furosemide (LASIX) 40 MG tablet [id number] Yes TAKE 1 TABLET BY MOUTH EVERY DAY  Patient taking differently: Take 40 mg by mouth every other day.   Bensimhon, Shaune Pascal, MD Taking Active   levothyroxine (SYNTHROID) 88 MCG tablet [id number] Yes TAKE 1 TABLET BY MOUTH DAILY BEFORE BREAKFAST. Colon Branch, MD Taking Active   mexiletine (MEXITIL) 250  MG capsule [id number] Yes Take 1 capsule (250 mg total) by mouth 2 (two) times daily. Deboraha Sprang, MD Taking Active   nitroGLYCERIN (NITROSTAT) 0.4 MG SL tablet [id number] Yes Place 1 tablet (0.4 mg total) under the tongue every 5 (five) minutes x 3 doses as needed for chest pain. Colon Branch, MD Taking Active            Med Note Juliette Alcide Jul 02, 2021  2:32 PM)    Omega-3 Fatty Acids (FISH OIL) 1200 MG CAPS [id number] Yes Take 3,600 mg by mouth daily. [provider] Taking Active Self  rosuvastatin (CRESTOR) 40 MG tablet [id number] Yes TAKE 1 TABLET BY MOUTH EVERYDAY AT BEDTIME Colon Branch, MD Taking Active   spironolactone (ALDACTONE) 25 MG tablet [id number] Yes TAKE 1/2 TABLET BY MOUTH EVERY DAY Bensimhon, Shaune Pascal, MD Taking Active             Patient Active Problem List   Diagnosis Date Noted   VT (ventricular tachycardia) ([place]) 07/02/2021   Paroxysmal atrial fibrillation ([place]) 07/02/2021   Heme + stool    Internal and external prolapsed hemorrhoids    Difficulty swallowing 01/21/2018   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 [facility]) 12/24/2017   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 ([place]) 11/12/2017   PCP NOTES >>>>>>>>>>>>>>>>>>>>> 04/21/2016   Seborrheic dermatitis of scalp 04/11/2013   Annual physical exam 04/11/2011  MITRAL REGURGITATION 23/76/2831   SYSTOLIC HEART FAILURE, CHRONIC 10/03/2009   Hyperglycemia 06/19/2008   Coronary atherosclerosis 06/14/2008   Automatic implantable cardioverter-defibrillator in situ 06/01/2008   ERECTILE DYSFUNCTION 12/17/2007   Hyperlipidemia 02/06/2007   Gout 02/06/2007   RBBB 02/06/2007   Congestive heart failure ([place]) 02/06/2007   CORONARY ARTERY BYPASS GRAFT, FOUR VESSEL, HX OF 02/12/2005    Immunization History  Administered Date(s) Administered   Fluad Quad(high Dose 65+) 10/25/2020   Influenza Split 10/10/2011, 10/11/2012    Influenza Whole 11/18/2007, 09/27/2008, 09/27/2009, 08/20/2010   Influenza, High Dose Seasonal PF 11/25/2016, 10/22/2017, 10/21/2018, 10/19/2019, 10/14/2021   Influenza,inj,Quad PF,6+ Mos 10/13/2013, 10/24/2014, 11/20/2015   PFIZER(Purple Top)SARS-COV-2 Vaccination 02/03/2020, 02/22/2020, 09/18/2020   Pneumococcal Conjugate-13 04/13/2014   Pneumococcal Polysaccharide-23 03/07/2009, 11/20/2015   Td 12/15/2006, 04/27/2017   Zoster Recombinat (Shingrix) 04/30/2018, 07/02/2018   Zoster, Live 04/11/2013    Conditions to be addressed/monitored: CHF, CAD, HTN, HLD and Thyroid disease, gout, metastatic throat cancer; pre diabetes  Care Plan : General Pharmacy (Adult)  Updates made by Cherre Robins, RPH-CPP since 05/16/2022 12:00 AM     Problem: Medication Adherence (Wellness)      Long-Range Goal: Medication Adherence Maintained   Start Date: 03/14/2021  Recent Progress: On track  Priority: High  Note:   Current Barriers:  Suboptimal therapeutic regimen for thyroid disease Usually enters coverage gap in May / June  Pharmacist Clinical Goal(s):  Over the next 180 days, patient will verbalize ability to afford treatment regimen adhere to plan to optimize therapeutic regimen for thyroid disease, CHF, HTN and lipids as evidenced by report of adherence to recommended medication management changes through collaboration with PharmD and provider.  Working with clinical pharmacist to apply for patient assistance for Farxiga and Eliquis  Interventions: 1:1 collaboration with Colon Branch, MD regarding development and update of comprehensive plan of care as evidenced by provider attestation and co-signature Inter-disciplinary care team collaboration (see longitudinal plan of care) Comprehensive medication review performed; medication list updated in electronic medical record  Pre - Diabetes: Controlled; current treatment: diet and exercise (he is taking Farxiga 7m daily but this is mostly for  CHF) Current glucose readings: does not monitor BG at home Current exercise: frequent golfing and some walking  Hypertension / CHF: Controlled; Goal blood pressure < 140/90 and control CHF symptoms and prevent exacerbations current treatment:  candasartan 863m- take 0.5 tablet = 61m57mwice a day furosemide 73m69mery other day spironolactone 25mg43make 0.5 tablet daily Farxiga 10mg 861my (receiving through PAP until 12/14/2022) Current home readings: 120's  / 60's Unable to tolerate Entresto - caused itching and redness Interventions:  Discussed signs and symptoms of CHF exacerbation - weight gain, SOB, abdominal fullness, swelling in legs or abdomen, Fatigue and weakness, changes in ability to perform usual activities, persistent cough or wheezing with white or pink blood-tinged mucus, nausea and lack of appetite Continue to weigh daily - report weight gain of more than 3 lbs in 24 hours or 5 lbs in 1 week. Recommended continue current therapy  Hyperlipidemia: Controlled; LDL goal < 70 Current treatment: rosuvastatin 73mg d31m  Medications previously tried: atorvastatin   Recommended continue current therapy   Atrial Fibrillation / VT: Controlled Current rate/rhythm control: amiodarone 200mg tw46ma day Mexiletine 250mg twi39m day Anticoagulant treatment:  Eliquis 5mg twice41mily CHADS2VASc score: 4 Patient usually hits Medicare Coverage gap in May or June. Per patient has not hit yet.Applied for Eliquis PAP but due to 2021  income patient was not approved.  Current out of pocket medication expense if $413 (patient only)  Interventions:  Recommended continue current therapy Plan to try for PAP again in 2023 as patient's financial situation was different in 2022 than 2021.  Will check back in with patient later in 2023 as he has to spend 3% of household income on medication to qualify for patient assistance program for Eliquis. Has not met 3% spend yet.  Patient  Goals/Self-Care Activities Over the next 180 days, patient will:  take medications as prescribed  collaborate with provider on medication access solutions. Contact Khai Arrona at Dr Ethel Rana office if you hit Medicare Coverage gap. Continue to exercise with goal of 150 minutes or more per week as able.   Follow Up Plan: Telephone follow up appointment with care management team member scheduled for:  Clinical pharmacist will follow up in 2 to 3 months as unless needed sooner.         Medication Assistance:  Wilder Glade obtained through [facility] and Me medication assistance program.  Enrollment ends 12/14/2022     Applied for medication assistance for Eliquis in 2022 but patient was denied (based on 2021 income)   Patient's preferred pharmacy is:  [facility] [address], [place] [address] Phone: [phone] Fax: [place], [place] [place] [address] Phone: [phone] Fax: [phone]   Follow Up:  Patient agrees to Care Plan and Follow-up.  Plan: Telephone follow up appointment with care management team member scheduled for:   2 to 3 months .   Cherre Robins, PharmD Clinical Pharmacist [facility]

## 2022-05-16 NOTE — Patient Instructions (Signed)
Matthew Joyce It was a pleasure speaking with you  Below is a summary of your health goals and care plan   Patient Goals/Self-Care Activities  take medications as prescribed  collaborate with provider on medication access solutions. Contact Jadin Creque at Dr Ethel Rana office if you hit Medicare Coverage gap. Continue to exercise with goal of 150 minutes or more per week as able.    If you have any questions or concerns, please feel free to contact me either at the phone number below or with a MyChart message.   Keep up the good work!  Cherre Robins, PharmD Clinical Pharmacist [place] [phone] (direct line)  [phone] (main office number)   Patient verbalizes understanding of instructions and care plan provided today and agrees to view in [place]. Active MyChart status and patient understanding of how to access instructions and care plan via MyChart confirmed with patient.

## 2022-05-27 ENCOUNTER — Telehealth (HOSPITAL_COMMUNITY): Payer: Self-pay | Admitting: Pharmacy Technician

## 2022-05-27 NOTE — Telephone Encounter (Signed)
Advanced Heart Failure Patient Advocate Encounter  Patient left message stating that he needed help with affording one of his medications but did not specify which one. Called and left the patient a message to return the call.  Charlann Boxer, CPhT

## 2022-05-29 DIAGNOSIS — L578 Other skin changes due to chronic exposure to nonionizing radiation: Secondary | ICD-10-CM | POA: Diagnosis not present

## 2022-05-29 DIAGNOSIS — L821 Other seborrheic keratosis: Secondary | ICD-10-CM | POA: Diagnosis not present

## 2022-05-29 DIAGNOSIS — L570 Actinic keratosis: Secondary | ICD-10-CM | POA: Diagnosis not present

## 2022-06-13 DIAGNOSIS — E785 Hyperlipidemia, unspecified: Secondary | ICD-10-CM | POA: Diagnosis not present

## 2022-06-13 DIAGNOSIS — I4891 Unspecified atrial fibrillation: Secondary | ICD-10-CM | POA: Diagnosis not present

## 2022-06-13 DIAGNOSIS — I509 Heart failure, unspecified: Secondary | ICD-10-CM

## 2022-06-13 DIAGNOSIS — I119 Hypertensive heart disease without heart failure: Secondary | ICD-10-CM | POA: Diagnosis not present

## 2022-06-19 ENCOUNTER — Ambulatory Visit (INDEPENDENT_AMBULATORY_CARE_PROVIDER_SITE_OTHER): Payer: Medicare HMO | Admitting: Internal Medicine

## 2022-06-19 ENCOUNTER — Encounter: Payer: Self-pay | Admitting: Internal Medicine

## 2022-06-19 VITALS — BP 122/86 | HR 46 | Temp 97.7°F | Resp 16 | Ht 69.0 in | Wt 147.2 lb

## 2022-06-19 DIAGNOSIS — E038 Other specified hypothyroidism: Secondary | ICD-10-CM | POA: Diagnosis not present

## 2022-06-19 DIAGNOSIS — Z0000 Encounter for general adult medical examination without abnormal findings: Secondary | ICD-10-CM | POA: Diagnosis not present

## 2022-06-19 DIAGNOSIS — E785 Hyperlipidemia, unspecified: Secondary | ICD-10-CM | POA: Diagnosis not present

## 2022-06-19 DIAGNOSIS — R739 Hyperglycemia, unspecified: Secondary | ICD-10-CM | POA: Diagnosis not present

## 2022-06-19 LAB — CBC WITH DIFFERENTIAL/PLATELET
Basophils Absolute: 0.1 10*3/uL (ref 0.0–0.1)
Basophils Relative: 1.8 % (ref 0.0–3.0)
Eosinophils Absolute: 0.1 10*3/uL (ref 0.0–0.7)
Eosinophils Relative: 2.5 % (ref 0.0–5.0)
HCT: 40.8 % (ref 39.0–52.0)
Hemoglobin: 13 g/dL (ref 13.0–17.0)
Lymphocytes Relative: 16 % (ref 12.0–46.0)
Lymphs Abs: 0.8 10*3/uL (ref 0.7–4.0)
MCHC: 31.9 g/dL (ref 30.0–36.0)
MCV: 88.8 fl (ref 78.0–100.0)
Monocytes Absolute: 0.7 10*3/uL (ref 0.1–1.0)
Monocytes Relative: 14.9 % — ABNORMAL HIGH (ref 3.0–12.0)
Neutro Abs: 3.2 10*3/uL (ref 1.4–7.7)
Neutrophils Relative %: 64.8 % (ref 43.0–77.0)
Platelets: 172 10*3/uL (ref 150.0–400.0)
RBC: 4.6 Mil/uL (ref 4.22–5.81)
RDW: 15.4 % (ref 11.5–15.5)
WBC: 5 10*3/uL (ref 4.0–10.5)

## 2022-06-19 LAB — LIPID PANEL
Cholesterol: 123 mg/dL (ref 0–200)
HDL: 59.1 mg/dL (ref >39.00)
LDL Cholesterol: 54 mg/dL (ref 0–99)
NonHDL: 64.02
Total CHOL/HDL Ratio: 2
Triglycerides: 52 mg/dL (ref 0.0–149.0)
VLDL: 10.4 mg/dL (ref 0.0–40.0)

## 2022-06-19 LAB — BASIC METABOLIC PANEL
BUN: 18 mg/dL (ref 6–23)
CO2: 29 mEq/L (ref 19–32)
Calcium: 9.5 mg/dL (ref 8.4–10.5)
Chloride: 103 mEq/L (ref 96–112)
Creatinine, Ser: 1.23 mg/dL (ref 0.40–1.50)
GFR: 58.89 mL/min — ABNORMAL LOW (ref >60.00)
Glucose, Bld: 95 mg/dL (ref 70–99)
Potassium: 4.1 mEq/L (ref 3.5–5.1)
Sodium: 138 mEq/L (ref 135–145)

## 2022-06-19 LAB — TSH: TSH: 2.42 u[IU]/mL (ref 0.35–5.50)

## 2022-06-19 LAB — HEMOGLOBIN A1C: Hgb A1c MFr Bld: 5.9 % (ref 4.6–6.5)

## 2022-06-19 NOTE — Progress Notes (Signed)
Subjective:    Patient ID: Matthew Romans Sr., male    DOB: [birth year], 71 y.o.   MRN: [id number]  DOS:  06/19/2022 Type of visit - description: CPX  Few weeks ago had fatigue, DOE, saw cardiology, meds adjusted. Currently feeling better. Significant weight loss noted.  He denies fever chills or night sweats. Again cardiac symptoms improved. Denies palpitation or cough. Appetite is okay and he is eating plenty.   Wt Readings from Last 3 Encounters:  06/19/22 147 lb 4 oz (66.8 kg)  04/21/22 157 lb (71.2 kg)  03/25/22 163 lb 3.2 oz (74 kg)     Review of Systems  Other than above, a 14 point review of systems is negative    Past Medical History:  Diagnosis Date   AICD (automatic cardioverter/defibrillator) present 6/09   St. Jude   Bradycardia    Use of beta blocker limited   CAD (coronary artery disease)    a- S/p cabg in march 2006 by Dr.Owen;  b. [name] 5/12: large IL scar from apex to base, no ischemia, EF 37%   Cancer (HCC)    on right side of neck   ED (erectile dysfunction)    Gout    Hyperlipidemia    Ischemic cardiomyopathy    a-Echocardiogram, Nov 2006, showed ejection fraction of 30-40% with mild to moderated mitral  regurgitation and mild aortic regurgitation b- Cardiac MRI, May 2007, EF of 44% with 50% scar involving  the inferolateral walls. No comment of mitral regurgitation. c- last echo  11/10: EF 30-35%  mod MR d- Nega T-Wave alternans testing in May of 2007 e- no ACE due to low BP;  f. CPX 3/11: normal   RBBB (right bundle branch block with left anterior fascicular block)    Systolic CHF, chronic (HCC)    Thyroid disease     Past Surgical History:  Procedure Laterality Date   CARDIAC DEFIBRILLATOR PLACEMENT  06/01/08   ICD   COLONOSCOPY     COLONOSCOPY WITH PROPOFOL N/A 07/28/2019   Procedure: COLONOSCOPY WITH PROPOFOL;  Surgeon: [name], MD;  Location: [facility] END[facility]COPY;  Service: Endoscopy;  Laterality: N/A;   CORONARY ARTERY BYPASS GRAFT   02-2005   ICD GENERATOR CHANGEOUT N/A 02/03/2017   Procedure: ICD Generator Changeout;  Surgeon: [name], MD;  Location: [facility];  Service: Cardiovascular;  Laterality: N/A;   ORIF ANKLE FRACTURE  1987   left    RIGHT/LEFT HEART CATH AND CORONARY/GRAFT ANGIOGRAPHY N/A 09/20/2019   Procedure: RIGHT/LEFT HEART CATH AND CORONARY/GRAFT ANGIOGRAPHY;  Surgeon: [name], MD;  Location: [facility];  Service: Cardiovascular;  Laterality: N/A;   SHOULDER ARTHROSCOPY  02/2010   rt    TEE WITHOUT CARDIOVERSION N/A 10/24/2019   Procedure: TRANSESOPHAGEAL ECHOCARDIOGRAM (TEE);  Surgeon: [name], MD;  Location[facility] ENDOSCOPY;  Service: Cardiovascular;  Laterality: N/A;   TONSILLECTOMY     as a child   Social History   Socioeconomic History   Marital status: Married    Spouse name: Not on file   Number of children: 2   Years of education: Not on file   Highest education level: Not on file  Occupational History   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     Employer: [employer]  Tobacco Use   Smoking status: Former    Packs/day: 0.10    Types: Cigarettes    Quit date: 12/16/1999  Years since quitting: 22.5   Smokeless tobacco: Never  Vaping Use   Vaping Use: Never used  Substance and Sexual Activity   Alcohol use: Not Currently   Drug use: No   Sexual activity: Not on file  Other Topics Concern   Not on file  Social History Narrative   Retired    household- pt and wife   2 adult children in [place] also has grandchildren   Former but not current smoker, occasional alcohol no drug use   Social Determinants of Radio broadcast assistant Strain: Medium Risk (01/14/2022)   Overall Financial Resource Strain (CARDIA)    Difficulty of Paying Living Expenses: Somewhat hard  Food Insecurity: No Food Insecurity (10/10/2021)   Hunger Vital Sign    Worried About Running Out of Food in the Last  Year: Never true    [place] in the Last Year: Never true  Transportation Needs: No Transportation Needs (10/10/2021)   PRAPARE - Hydrologist (Medical): No    Lack of Transportation (Non-Medical): No  Physical Activity: Sufficiently Active (05/16/2022)   Exercise Vital Sign    Days of Exercise per Week: 5 days    Minutes of Exercise per Session: 60 min  Stress: No Stress Concern Present (10/10/2021)   [place]    Feeling of Stress : Not at all  Social Connections: Moderately Integrated (10/10/2021)   Social Connection and Isolation Panel [NHANES]    Frequency of Communication with Friends and Family: More than three times a week    Frequency of Social Gatherings with Friends and Family: More than three times a week    Attends Religious Services: More than 4 times per year    Active Member of [employer] or Organizations: No    Attends Archivist Meetings: Never    Marital Status: Married  Human resources officer Violence: Not At Risk (10/10/2021)   Humiliation, Afraid, Rape, and Kick questionnaire    Fear of Current or Ex-Partner: No    Emotionally Abused: No    Physically Abused: No    Sexually Abused: No    Current Outpatient Medications  Medication Instructions   acetaminophen (TYLENOL) 650 mg, Oral, Every 6 hours PRN   amiodarone (PACERONE) 200 mg, Oral, 2 times daily   candesartan (ATACAND) 4 mg, Oral, 2 times daily   carboxymethylcellul-glycerin (OPTIVE) 0.5-0.9 % ophthalmic solution 1 drop, Both Eyes, Daily   cetirizine (ZYRTEC) 10 mg, Oral, As needed,     Colchicine (MITIGARE) 0.6 mg, Oral, 2 times daily PRN   dapagliflozin propanediol (FARXIGA) 10 MG TABS tablet TAKE 1 TABLET BY MOUTH EVERY DAY BEFORE BREAKFAST   ELIQUIS 5 MG TABS tablet TAKE 1 TABLET BY MOUTH TWICE A DAY   Fish Oil 3,600 mg, Oral, Daily   furosemide (LASIX) 40 MG tablet TAKE 1 TABLET BY MOUTH EVERY DAY    levothyroxine (SYNTHROID) 88 MCG tablet TAKE 1 TABLET BY MOUTH DAILY BEFORE BREAKFAST.   mexiletine (MEXITIL) 250 mg, Oral, 2 times daily   nitroGLYCERIN (NITROSTAT) 0.4 mg, Sublingual, Every 5 min x3 PRN   rosuvastatin (CRESTOR) 40 MG tablet TAKE 1 TABLET BY MOUTH EVERYDAY AT BEDTIME   spironolactone (ALDACTONE) 25 MG tablet TAKE 1/2 TABLET BY MOUTH EVERY DAY       Objective:   Physical Exam BP 122/86   Pulse (!) 46   Temp 97.7 F (36.5 C) (Oral)   Resp 16  Ht '5\' 9"'$  (1.753 m)   Wt 147 lb 4 oz (66.8 kg)   SpO2 96%   BMI 21.75 kg/m  General: Well developed, NAD, BMI noted Neck: No  thyromegaly  HEENT:  Normocephalic . Face symmetric, atraumatic Lungs:  CTA B Normal respiratory effort, no intercostal retractions, no accessory muscle use. Heart: Bradycardic.  Abdomen:  Not distended, soft, non-tender. No rebound or rigidity.   Lower extremities: no pretibial edema bilaterally  Skin: Exposed areas without rash. Not pale. Not jaundice Neurologic:  alert & oriented X3.  Speech normal, gait appropriate for age and unassisted Strength symmetric and appropriate for age.  Psych: Cognition and judgment appear intact.  Cooperative with normal attention span and concentration.  Behavior appropriate. No anxious or depressed appearing.     Assessment     Assessment Prediabetes Hyperlipidemia Gout Hypothyroidism E.D. CV: --CAD, CABG 2006 --Ischemic cardiomyopathy   --AICD 2009 St Jude, replaced 01-2017  - DX 11/2017:  p16+ squamous cell carcinoma of the head and neck from an unknown primary, cT0 cN2, presenting with solitary right neck adenopathy. S/p chemo x 6, XRT x 35; treatment ended 01/2018   PLAN: Here for CPX Prediabetes: Check A1c Hyperlipidemia: On Crestor.  Checking labs. Hypothyroidism: On Synthroid, + weight loss noted.  Checking TSH. CAD, CHF, AICD, recent VT-VF episode Patient reported fatigue, DOE, saw cardiology, BNP was elevated, echo was done, EF  25 to 30%, have global HK, also had a episode of VT/VF, mexiletine was added. Had another follow-up with cardiology 04/21/2022, he was feeling better and felt to be stable. Weight loss: With no fever, no night sweats.  He is actually feeling well. Checking labs and reassess in 3 months. RTC 3 months

## 2022-06-19 NOTE — Patient Instructions (Addendum)
Recommend to proceed with covid booster (bivalent) at your pharmacy.    Check the  blood pressure regularly BP GOAL is between 110/65 and  135/85. If it is consistently higher or lower, let me know     GO TO THE LAB : Get the blood work     [place], [place] back for a checkup in 3 months    "Living will", "[place] of attorney": Advanced care planning  (If you already have a living will or healthcare power of attorney, please bring the copy to be scanned in your chart.)  Advance care planning is a process that supports adults in  understanding and sharing their preferences regarding future medical care.   The patient's preferences are recorded in documents called Advance Directives.    Advanced directives are completed (and can be modified at any time) while the patient is in full mental capacity.   The documentation should be available at all times to the patient, the family and the healthcare providers.  Bring in a copy to be scanned in your chart is an excellent idea and is recommended   This legal documents direct treatment decision making and/or appoint a surrogate to make the decision if the patient is not capable to do so.    Advance directives can be documented in many types of formats,  documents have names such as:  Lliving will  Durable power of attorney for healthcare (healthcare proxy or healthcare power of attorney)  Combined directives  Physician orders for life-sustaining treatment    More information at:  [email or web address]

## 2022-06-19 NOTE — Assessment & Plan Note (Signed)
Here for CPX Prediabetes: Check A1c Hyperlipidemia: On Crestor.  Checking labs. Hypothyroidism: On Synthroid, + weight loss noted.  Checking TSH. CAD, CHF, AICD, recent VT-VF episode Patient reported fatigue, DOE, saw cardiology, BNP was elevated, echo was done, EF 25 to 30%, have global HK, also had a episode of VT/VF, mexiletine was added. Had another follow-up with cardiology 04/21/2022, he was feeling better and felt to be stable. Weight loss: With no fever, no night sweats.  He is actually feeling well. Checking labs and reassess in 3 months. RTC 3 months

## 2022-06-19 NOTE — Assessment & Plan Note (Signed)
--  Td 04/2017 - pneumonia shot 2009 and 2016, Prevnar 2015 - zostavax 2014; s/p shingrex x 2 -  Covid shots : booster rec, declines  - rec  flu shot q year --CCS: 01-2008: Cscope, tics, no polyps; + IFOB  06/2019, cscope 07/2019 no polyps, + hemorrhoids, no further CCS per cscope report --Prostate cancer screening: DRE and PSA 2022 wnl. --Diet and exercise: Doing well --labs:BMP FLP CBC A1c TSH -POA discussed.

## 2022-06-24 ENCOUNTER — Other Ambulatory Visit: Payer: Self-pay | Admitting: Internal Medicine

## 2022-06-26 ENCOUNTER — Ambulatory Visit: Payer: Medicare HMO | Admitting: Internal Medicine

## 2022-06-26 ENCOUNTER — Encounter: Payer: Self-pay | Admitting: Internal Medicine

## 2022-06-26 VITALS — BP 104/66 | HR 43 | Ht 69.0 in | Wt 144.4 lb

## 2022-06-26 DIAGNOSIS — I472 Ventricular tachycardia, unspecified: Secondary | ICD-10-CM

## 2022-06-26 DIAGNOSIS — I5022 Chronic systolic (congestive) heart failure: Secondary | ICD-10-CM

## 2022-06-26 DIAGNOSIS — Z95810 Presence of automatic (implantable) cardiac defibrillator: Secondary | ICD-10-CM | POA: Diagnosis not present

## 2022-06-26 MED ORDER — AMIODARONE HCL 200 MG PO TABS
200.0000 mg | ORAL_TABLET | Freq: Every day | ORAL | 3 refills | Status: DC
Start: 2022-06-26 — End: 2022-11-25

## 2022-06-26 NOTE — Progress Notes (Signed)
HPI Matthew Joyce today for ongoing evaluation of ventricular tachycardia.  He is a very pleasant 71 year old man with an ischemic cardiomyopathy status post bypass surgery over 16 years ago.  His ejection fraction has been between 30 and 35%.  He is status post ICD implantation.  He has a history of sinus node dysfunction and right bundle branch block.  He has been intolerant to beta-blockers in the past.  The patient has had recurrent episodes of ventricular tachycardia and has been placed on Pacerone as well as mexiletine.  He feels better with no recurrent arrhythmias recently and no problems tolerating his medications.  He has developed bradycardia but is asymptomatic.  He is able to play golf and walk.  He has class II heart failure symptoms. Allergies  Allergen Reactions   Atorvastatin      myalgia, Muscle aching   Entresto [Sacubitril-Valsartan] Itching    Had itching and redness   Sulfonamide Derivatives     Rash and hot flashes   Sulfamethoxazole Rash and Other (See Comments)    Hot flashes   Tape Other (See Comments)    Rips skin- use PAPER tape     Current Outpatient Medications  Medication Sig Dispense Refill   acetaminophen (TYLENOL) 325 MG tablet Take 650 mg by mouth every 6 (six) hours as needed (for pain.).     candesartan (ATACAND) 8 MG tablet TAKE 0.5 TABLETS (4 MG TOTAL) BY MOUTH 2 (TWO) TIMES DAILY. 90 tablet 3   carboxymethylcellul-glycerin (OPTIVE) 0.5-0.9 % ophthalmic solution Place 1 drop into both eyes daily.     dapagliflozin propanediol (FARXIGA) 10 MG TABS tablet TAKE 1 TABLET BY MOUTH EVERY DAY BEFORE BREAKFAST 90 tablet 3   ELIQUIS 5 MG TABS tablet TAKE 1 TABLET BY MOUTH TWICE A DAY 60 tablet 6   furosemide (LASIX) 40 MG tablet TAKE 1 TABLET BY MOUTH EVERY DAY (Patient taking differently: Take 40 mg by mouth daily.) 90 tablet 3   levothyroxine (SYNTHROID) 88 MCG tablet Take 1 tablet (88 mcg total) by mouth daily before breakfast. 90 tablet 1    mexiletine (MEXITIL) 250 MG capsule TAKE 1 CAPSULE BY MOUTH 2 TIMES DAILY. 180 capsule 1   nitroGLYCERIN (NITROSTAT) 0.4 MG SL tablet Place 1 tablet (0.4 mg total) under the tongue every 5 (five) minutes x 3 doses as needed for chest pain. 25 tablet 3   Omega-3 Fatty Acids (FISH OIL) 1200 MG CAPS Take 3,600 mg by mouth daily.     rosuvastatin (CRESTOR) 40 MG tablet TAKE 1 TABLET BY MOUTH EVERYDAY AT BEDTIME 90 tablet 2   spironolactone (ALDACTONE) 25 MG tablet TAKE 1/2 TABLET BY MOUTH EVERY DAY 45 tablet 2   amiodarone (PACERONE) 200 MG tablet Take 1 tablet (200 mg total) by mouth daily. 90 tablet 3   cetirizine (ZYRTEC) 10 MG tablet Take 10 mg by mouth as needed for allergies. (Patient not taking: Reported on 06/26/2022)     Colchicine (MITIGARE) 0.6 MG CAPS Take 0.6 mg by mouth 2 (two) times daily as needed (gout flare). (Patient not taking: Reported on 06/26/2022) 60 capsule 0   No current facility-administered medications for this visit.     Past Medical History:  Diagnosis Date   AICD (automatic cardioverter/defibrillator) present 6/09   [employer]   Bradycardia    Use of beta blocker limited   CAD (coronary artery disease)    a- S/p cabg in march 2006 by Dr.Owen;  b. myoview 5/12: large IL  scar from apex to base, no ischemia, EF 37%   Cancer (HCC)    on right side of neck   ED (erectile dysfunction)    Gout    Hyperlipidemia    Ischemic cardiomyopathy    a-Echocardiogram, Nov 2006, showed ejection fraction of 30-40% with mild to moderated mitral  regurgitation and mild aortic regurgitation b- Cardiac MRI, May 2007, EF of 44% with 50% scar involving  the inferolateral walls. No comment of mitral regurgitation. c- last echo  11/10: EF 30-35%  mod MR d- Nega T-Wave alternans testing in May of 2007 e- no ACE due to low BP;  f. CPX 3/11: normal   RBBB (right bundle branch block with left anterior fascicular block)    Systolic CHF, chronic (HCC)    Thyroid disease     ROS:   All  systems reviewed and negative except as noted in the HPI.   Past Surgical History:  Procedure Laterality Date   CARDIAC DEFIBRILLATOR PLACEMENT  06/01/08   ICD   COLONOSCOPY     COLONOSCOPY WITH PROPOFOL N/A 07/28/2019   Procedure: COLONOSCOPY WITH PROPOFOL;  Surgeon: [name], MD;  Location: [facility];  Service: Endoscopy;  Laterality: N/A;   CORONARY ARTERY BYPASS GRAFT  02-2005   ICD GENERATOR CHANGEOUT N/A 02/03/2017   Procedure: ICD Generator Changeout;  Surgeon: [name], MD;  Location: [place] [facility];  Service: Cardiovascular;  Laterality: N/A;   ORIF ANKLE FRACTURE  1987   left    RIGHT/LEFT HEART CATH AND CORONARY/GRAFT ANGIOGRAPHY N/A 09/20/2019   Procedure: RIGHT/LEFT HEART CATH AND CORONARY/GRAFT ANGIOGRAPHY;  Surgeon: [name], MD;  Location: [place] [facility];  Service: Cardiovascular;  Laterality: N/A;   SHOULDER ARTHROSCOPY  02/2010   rt    TEE WITHOUT CARDIOVERSION N/A 10/24/2019   Procedure: TRANSESOPHAGEAL ECHOCARDIOGRAM (TEE);  Surgeon: [name], MD;  Location: [facility];  Service: Cardiovascular;  Laterality: N/A;   TONSILLECTOMY     as a child     Family History  Problem Relation Age of Onset   Heart attack Father 61   Cardiomyopathy Brother    Colon cancer Neg Hx    Prostate cancer Neg Hx    Diabetes Neg Hx      Social History   Socioeconomic History   Marital status: Married    Spouse name: Not on file   Number of children: 2   Years of education: Not on file   Highest education level: Not on file  Occupational History   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     Employer: [employer]  Tobacco Use   Smoking status: Former    Packs/day: 0.10    Types: Cigarettes    Quit date: 12/16/1999    Years since quitting: 22.5   Smokeless tobacco: Never  Vaping Use   Vaping Use: Never used  Substance and Sexual Activity   Alcohol use: Not Currently    Drug use: No   Sexual activity: Not on file  Other Topics Concern   Not on file  Social History Narrative   Retired    household- pt and wife   2 adult children in [place] also has grandchildren   Former but not current smoker, occasional alcohol no drug use   Social Determinants of Health   Financial Resource Strain: Medium Risk (01/14/2022)   Overall Financial Resource Strain (CARDIA)    Difficulty of Paying Living Expenses: Somewhat  hard  Food Insecurity: No Food Insecurity (10/10/2021)   Hunger Vital Sign    Worried About Running Out of Food in the Last Year: Never true    Ran Out of Food in the Last Year: Never true  Transportation Needs: No Transportation Needs (10/10/2021)   PRAPARE - Hydrologist (Medical): No    Lack of Transportation (Non-Medical): No  Physical Activity: Sufficiently Active (05/16/2022)   Exercise Vital Sign    Days of Exercise per Week: 5 days    Minutes of Exercise per Session: 60 min  Stress: No Stress Concern Present (10/10/2021)   [place]    Feeling of Stress : Not at all  Social Connections: Moderately Integrated (10/10/2021)   Social Connection and Isolation Panel [NHANES]    Frequency of Communication with Friends and Family: More than three times a week    Frequency of Social Gatherings with Friends and Family: More than three times a week    Attends Religious Services: More than 4 times per year    Active Member of [employer] or Organizations: No    Attends Archivist Meetings: Never    Marital Status: Married  Human resources officer Violence: Not At Risk (10/10/2021)   Humiliation, Afraid, Rape, and Kick questionnaire    Fear of Current or Ex-Partner: No    Emotionally Abused: No    Physically Abused: No    Sexually Abused: No     BP 104/66   Pulse (!) 43   Ht '5\' 9"'$  (1.753 m)   Wt 144 lb 6.4 oz (65.5 kg)   SpO2 98%   BMI 21.32 kg/m    Physical Exam:  Well appearing 70 year old man, NAD HEENT: Unremarkable Neck:  No JVD, no thyromegally Lymphatics:  No adenopathy Back:  No CVA tenderness Lungs:  Clear, with no wheezes, rales, or rhonchi HEART:  Regular bradycardia rhythm, no murmurs, no rubs, no clicks Abd:  soft, positive bowel sounds, no organomegally, no rebound, no guarding Ext:  2 plus pulses, no edema, no cyanosis, no clubbing Skin:  No rashes no nodules Neuro:  CN II through XII intact, motor grossly intact  EKG -sinus bradycardia with right bundle branch block  DEVICE  Normal device function.  See PaceArt for details.   Assess/Plan:  1.  Recurrent ventricular tachycardia -his ventricular tachycardia appears to be fairly well controlled on a combination of amiodarone and mexiletine.  He will undergo watchful waiting.  For now I would not recommend VT ablation unless his ventricular tachycardia becomes incessant and slower on medical therapy. 2.  Chronic systolic heart failure - his heart failure symptoms are class II.  He will continue his current medical therapy.  He is encouraged to maintain a low-sodium diet. 3.  Coronary artery disease -he is status post bypass grafting and has no anginal symptoms. 4.  Paroxysmal atrial fibrillation -the patient is maintaining sinus rhythm.  No change in medications.  Matthew Peru, MD

## 2022-06-26 NOTE — Patient Instructions (Signed)
Medication Instructions:  Your physician recommends that you continue on your current medications as directed. Please refer to the Current Medication list given to you today.  *If you need a refill on your cardiac medications before your next appointment, please call your pharmacy*  Lab Work: None ordered.  If you have labs (blood work) drawn today and your tests are completely normal, you will receive your results only by: [place] (if you have MyChart) OR A paper copy in the mail If you have any lab test that is abnormal or we need to change your treatment, we will call you to review the results.  Testing/Procedures: None ordered.  Follow-Up: At [facility], you and your health needs are our priority.  As part of our continuing mission to provide you with exceptional heart care, we have created designated Provider Care Teams.  These Care Teams include your primary Cardiologist (physician) and Advanced Practice Providers (APPs -  Physician Assistants and Nurse Practitioners) who all work together to provide you with the care you need, when you need it.  We recommend signing up for the patient portal called "MyChart".  Sign up information is provided on this After Visit Summary.  MyChart is used to connect with patients for Virtual Visits (Telemedicine).  Patients are able to view lab/test results, encounter notes, upcoming appointments, etc.  Non-urgent messages can be sent to your provider as well.   To learn more about what you can do with MyChart, go to [email or web address].    Your next appointment:   1 year(s)  The format for your next appointment:   In Person  Provider:   Cristopher Peru, MD{or one of the following Advanced Practice Providers on your designated Care Team:   Tommye Standard, [place] Legrand Como "Jonni Sanger" Chalmers Cater, [place]  Remote monitoring is used to monitor your ICD from home. This monitoring reduces the number of office visits required to check your device to one  time per year. It allows [place] to keep an eye on the functioning of your device to ensure it is working properly. You are scheduled for a device check from home on 07/29/22. You may send your transmission at any time that day. If you have a wireless device, the transmission will be sent automatically. After your physician reviews your transmission, you will receive a postcard with your next transmission date.  Important Information About [employer]

## 2022-07-24 ENCOUNTER — Encounter (HOSPITAL_COMMUNITY): Payer: Medicare HMO | Admitting: Internal Medicine

## 2022-07-24 ENCOUNTER — Encounter (HOSPITAL_COMMUNITY): Payer: Self-pay | Admitting: Internal Medicine

## 2022-07-24 ENCOUNTER — Ambulatory Visit (HOSPITAL_COMMUNITY)
Admission: RE | Admit: 2022-07-24 | Discharge: 2022-07-24 | Disposition: A | Discharge status: Home / Self Care | Payer: Medicare HMO | Source: Ambulatory Visit | Attending: Internal Medicine | Admitting: Internal Medicine

## 2022-07-24 VITALS — BP 90/50 | HR 43 | Wt 143.6 lb

## 2022-07-24 DIAGNOSIS — E119 Type 2 diabetes mellitus without complications: Secondary | ICD-10-CM | POA: Insufficient documentation

## 2022-07-24 DIAGNOSIS — Z79899 Other long term (current) drug therapy: Secondary | ICD-10-CM | POA: Insufficient documentation

## 2022-07-24 DIAGNOSIS — I472 Ventricular tachycardia, unspecified: Secondary | ICD-10-CM

## 2022-07-24 DIAGNOSIS — Z7984 Long term (current) use of oral hypoglycemic drugs: Secondary | ICD-10-CM | POA: Insufficient documentation

## 2022-07-24 DIAGNOSIS — I480 Paroxysmal atrial fibrillation: Secondary | ICD-10-CM

## 2022-07-24 DIAGNOSIS — Z95810 Presence of automatic (implantable) cardiac defibrillator: Secondary | ICD-10-CM | POA: Diagnosis not present

## 2022-07-24 DIAGNOSIS — I083 Combined rheumatic disorders of mitral, aortic and tricuspid valves: Secondary | ICD-10-CM | POA: Insufficient documentation

## 2022-07-24 DIAGNOSIS — R0602 Shortness of breath: Secondary | ICD-10-CM | POA: Insufficient documentation

## 2022-07-24 DIAGNOSIS — Z951 Presence of aortocoronary bypass graft: Secondary | ICD-10-CM | POA: Diagnosis not present

## 2022-07-24 DIAGNOSIS — Z923 Personal history of irradiation: Secondary | ICD-10-CM | POA: Diagnosis not present

## 2022-07-24 DIAGNOSIS — E785 Hyperlipidemia, unspecified: Secondary | ICD-10-CM | POA: Insufficient documentation

## 2022-07-24 DIAGNOSIS — I5022 Chronic systolic (congestive) heart failure: Secondary | ICD-10-CM

## 2022-07-24 DIAGNOSIS — I4510 Unspecified right bundle-branch block: Secondary | ICD-10-CM | POA: Insufficient documentation

## 2022-07-24 DIAGNOSIS — I2510 Atherosclerotic heart disease of native coronary artery without angina pectoris: Secondary | ICD-10-CM | POA: Diagnosis not present

## 2022-07-24 DIAGNOSIS — R634 Abnormal weight loss: Secondary | ICD-10-CM

## 2022-07-24 DIAGNOSIS — Z7901 Long term (current) use of anticoagulants: Secondary | ICD-10-CM | POA: Insufficient documentation

## 2022-07-24 DIAGNOSIS — Z9221 Personal history of antineoplastic chemotherapy: Secondary | ICD-10-CM | POA: Insufficient documentation

## 2022-07-24 LAB — COMPREHENSIVE METABOLIC PANEL
ALT: 32 U/L (ref 0–44)
AST: 26 U/L (ref 15–41)
Albumin: 3.6 g/dL (ref 3.5–5.0)
Alkaline Phosphatase: 78 U/L (ref 38–126)
Anion gap: 4 — ABNORMAL LOW (ref 5–15)
BUN: 21 mg/dL (ref 8–23)
CO2: 28 mmol/L (ref 22–32)
Calcium: 9.3 mg/dL (ref 8.9–10.3)
Chloride: 105 mmol/L (ref 98–111)
Creatinine, Ser: 1.3 mg/dL — ABNORMAL HIGH (ref 0.61–1.24)
GFR, Estimated: 58 mL/min — ABNORMAL LOW (ref >60)
Glucose, Bld: 109 mg/dL — ABNORMAL HIGH (ref 70–99)
Potassium: 3.8 mmol/L (ref 3.5–5.1)
Sodium: 137 mmol/L (ref 135–145)
Total Bilirubin: 1 mg/dL (ref 0.3–1.2)
Total Protein: 5.9 g/dL — ABNORMAL LOW (ref 6.5–8.1)

## 2022-07-24 LAB — CBC
HCT: 42.8 % (ref 39.0–52.0)
Hemoglobin: 13.3 g/dL (ref 13.0–17.0)
MCH: 28.3 pg (ref 26.0–34.0)
MCHC: 31.1 g/dL (ref 30.0–36.0)
MCV: 91.1 fL (ref 80.0–100.0)
Platelets: 187 10*3/uL (ref 150–400)
RBC: 4.7 MIL/uL (ref 4.22–5.81)
RDW: 14.7 % (ref 11.5–15.5)
WBC: 6.3 10*3/uL (ref 4.0–10.5)
nRBC: 0 % (ref 0.0–0.2)

## 2022-07-24 LAB — TSH: TSH: 2.219 u[IU]/mL (ref 0.350–4.500)

## 2022-07-24 LAB — T4, FREE: Free T4: 1.65 ng/dL — ABNORMAL HIGH (ref 0.61–1.12)

## 2022-07-24 NOTE — Progress Notes (Signed)
[facility] Note  Date:  07/24/2022   ID:  Matthew Romans Sr., DOB [birth year], MRN [id number]  Location: Home  Provider location: [facility] Type of Visit: Established patient  PCP:  Colon Branch, MD  Cardiologist:  None Primary HF: [name]  Chief Complaint: Heart Failure follow-up   History of Present Illness:  Matthew Joyce is a 71 y.o. male with a history of a CAD S/P CABG in 2006 (LIMA -> LAD, SVG -> Ramus, SVG -> OM-2, SVG -> RCA), s/p ICD, ischemic MR, DM, hyperlipidemia, and throat cancer s/p chemo/XRT in 2018   Stress test 08/14/2016 with EF 31%, old infarct, no ischemia.    Developed CP and SOB in early October. Cath on 09/20/19. Coronary anatomy was stable but EF down to 20% on cath with 3+ MR Echo on 09/20/19 read as EF 25-30% with mod MR. (I reviewed echo and EF 20-25% and moderate to severe MR). Since that time has had CPX with only mild HF limitation.  TEE EF 20-25%. 3+MR and severe TR.    On 01/29/20 woke from sleep for unknown reasons. Found to have shock for VT/VF. Saw [name]   On 07/22/20 had multiple runs of NSVT, SVT and 1 run of VT (20 seconds max rate 220). Felt to have gotten inappropriate ATP for AF.    Seen in 9/21 for ICD firing due to AF with RVR. Started on amio and b-blocker stopped due to bradycardia.    Echo 05/29/20: EF 20% mod to severe RV dysfunction severe central MR Personally reviewed  Echo 5/23 EF 25-30% severe MR RV moderately reduced  Here for HF f/u. Says he feels ok. Has been losing weight. Lost almost 15 pounds. Appetite has been very good. No CP, edema, orthopnea or PND. Breathing ok for the most part. Still playing golf. SOB on hills. BP typically 110/50s. Walking 3x/week on TM 3.58mh at 6% grade. HR resting 40-60 with exercise into 120s.    Cardiac studies:   Cath 09/20/19   1. 3v CAD  2. Patent LIMA to LAD 3. Patent free RIMA to ramus 4. Occluded SVG to RCA with widely patent native  RCA 5. Unable to locate SVG to OM2 mentioned in OP report. There is no graft marker for this graft and Ar root shot did not show any SVG to OM (suspect may not have been placed) 6. EF 20% with global HK and 3+ MR     Ao = 108/53 (72) LV = 109/19 RA = 9 RV = 60/10 PA = 63/23 (36) PCW = 22 (v = 35-40) Fick cardiac output/index = 4.5/2.4 PVR = 3.0 WU SVR 1310 Ao sat = 99% PA sat = 66%, 69%  CPX 06/2020  FVC 3.54 (85%)      FEV1 2.81 (89%)        FEV1/FVC 79 (104%)        MVV 104 (82%)        Resting HR: 43 Standing HR:47 Peak HR: 104   (69% age predicted max HR)  BP rest: 84/60 Standing BP: 76/58 BP peak: 108/60  Peak VO2: 27.5 (99% predicted peak VO2)  VE/VCO2 slope:  30  OUES: 1.99  Peak RER: 0.95  Ventilatory Threshold: 23.7 (85% predicted or measured peak VO2)  Peak RR 39  Peak Ventilation:  63.0  VE/MVV:  61%  PETCO2 at peak:  33  O2pulse:  19   (146% predicted O2pulse)  CPX test 10/20/19   FVC 3.22 (81%)      FEV1 2.54 (84%)        FEV1/FVC 79 (103%)        MVV 92 (76%)       Resting HR: 44 Standing HR: 47 Peak HR: 107   (71% age predicted max HR)  BP rest: 92/56 Standing BP: 86/52 BP peak: 116/62  Peak VO2: 24.5 (88% predicted peak VO2)  VE/VCO2 slope:  37  OUES: 2.11  Peak RER: 1.01 O2pulse:  19   (127% predicted O2pulse)      TEE 10/24/19  1. Left ventricular ejection fraction 20-25%  2. Global right ventricle has moderately reduced systolic function.  3. Left atrial size was severely dilated.  4. No LAA clot.  5. Right atrial size was mild-moderately dilated.  6. The mitral valve is abnormal. Moderate mitral valve regurgitation.  7. Mildly restricted posterior MV leaflet with 3+ MR.  8. The tricuspid valve is normal in structure. Tricuspid valve regurgitation is severe.  Past Medical History:  Diagnosis Date   AICD (automatic cardioverter/defibrillator) present 6/09   St. Jude   Bradycardia    Use of beta blocker limited   CAD (coronary  artery disease)    a- S/p cabg in march 2006 by Dr.Owen;  b. [name] 5/12: large IL scar from apex to base, no ischemia, EF 37%   Cancer (HCC)    on right side of neck   ED (erectile dysfunction)    Gout    Hyperlipidemia    Ischemic cardiomyopathy    a-Echocardiogram, Nov 2006, showed ejection fraction of 30-40% with mild to moderated mitral  regurgitation and mild aortic regurgitation b- Cardiac MRI, May 2007, EF of 44% with 50% scar involving  the inferolateral walls. No comment of mitral regurgitation. c- last echo  11/10: EF 30-35%  mod MR d- Nega T-Wave alternans testing in May of 2007 e- no ACE due to low BP;  f. CPX 3/11: normal   RBBB (right bundle branch block with left anterior fascicular block)    Systolic CHF, chronic (HCC)    Thyroid disease    Past Surgical History:  Procedure Laterality Date   CARDIAC DEFIBRILLATOR PLACEMENT  06/01/08   ICD   COLONOSCOPY     COLONOSCOPY WITH PROPOFOL N/A 07/28/2019   Procedure: COLONOSCOPY WITH PROPOFOL;  Surgeon: [name], MD;  Location: [facility];  Service: Endoscopy;  Laterality: N/A;   CORONARY ARTERY BYPASS GRAFT  02-2005   ICD GENERATOR CHANGEOUT N/A 02/03/2017   Procedure: ICD Generator Changeout;  Surgeon: [name], MD;  Location: [place] [facility];  Service: Cardiovascular;  Laterality: N/A;   ORIF ANKLE FRACTURE  1987   left    RIGHT/LEFT HEART CATH AND CORONARY/GRAFT ANGIOGRAPHY N/A 09/20/2019   Procedure: RIGHT/LEFT HEART CATH AND CORONARY/GRAFT ANGIOGRAPHY;  Surgeon: [name], MD;  Location: [place] [facility];  Service: Cardiovascular;  Laterality: N/A;   SHOULDER ARTHROSCOPY  02/2010   rt    TEE WITHOUT CARDIOVERSION N/A 10/24/2019   Procedure: TRANSESOPHAGEAL ECHOCARDIOGRAM (TEE);  Surgeon: [name], MD;  Location: [facility];  Service: Cardiovascular;  Laterality: N/A;   TONSILLECTOMY     as a child     Current Outpatient Medications  Medication Sig Dispense Refill    acetaminophen (TYLENOL) 325 MG tablet Take 650 mg by mouth every 6 (six) hours as needed (for pain.).     amiodarone (PACERONE) 200 MG tablet Take 1  tablet (200 mg total) by mouth daily. 90 tablet 3   candesartan (ATACAND) 8 MG tablet TAKE 0.5 TABLETS (4 MG TOTAL) BY MOUTH 2 (TWO) TIMES DAILY. 90 tablet 3   carboxymethylcellul-glycerin (OPTIVE) 0.5-0.9 % ophthalmic solution Place 1 drop into both eyes daily.     cetirizine (ZYRTEC) 10 MG tablet Take 10 mg by mouth as needed for allergies.     Colchicine (MITIGARE) 0.6 MG CAPS Take 0.6 mg by mouth 2 (two) times daily as needed (gout flare). 60 capsule 0   dapagliflozin propanediol (FARXIGA) 10 MG TABS tablet TAKE 1 TABLET BY MOUTH EVERY DAY BEFORE BREAKFAST 90 tablet 3   ELIQUIS 5 MG TABS tablet TAKE 1 TABLET BY MOUTH TWICE A DAY 60 tablet 6   furosemide (LASIX) 40 MG tablet Take 40 mg by mouth every other day.     levothyroxine (SYNTHROID) 88 MCG tablet Take 1 tablet (88 mcg total) by mouth daily before breakfast. 90 tablet 1   mexiletine (MEXITIL) 250 MG capsule TAKE 1 CAPSULE BY MOUTH 2 TIMES DAILY. 180 capsule 1   nitroGLYCERIN (NITROSTAT) 0.4 MG SL tablet Place 1 tablet (0.4 mg total) under the tongue every 5 (five) minutes x 3 doses as needed for chest pain. 25 tablet 3   Omega-3 Fatty Acids (FISH OIL) 1200 MG CAPS Take 3,600 mg by mouth daily.     rosuvastatin (CRESTOR) 40 MG tablet TAKE 1 TABLET BY MOUTH EVERYDAY AT BEDTIME 90 tablet 2   spironolactone (ALDACTONE) 25 MG tablet TAKE 1/2 TABLET BY MOUTH EVERY DAY 45 tablet 2   No current facility-administered medications for this encounter.    Allergies:   Atorvastatin, Entresto [sacubitril-valsartan], Sulfonamide derivatives, Sulfamethoxazole, and Tape   Social History:  The patient  reports that he quit smoking about 22 years ago. His smoking use included cigarettes. He smoked an average of .1 packs per day. He has never used smokeless tobacco. He reports that he does not currently use  alcohol. He reports that he does not use drugs.   Family History:  The patient's family history includes Cardiomyopathy in his brother; Heart attack (age of onset: 49) in his father.   ROS:  Please see the history of present illness.   All other systems are personally reviewed and negative.   Vitals:   07/24/22 1335  BP: (!) 90/50  Pulse: (!) 43  SpO2: 97%  Weight: 65.1 kg (143 lb 9.6 oz)    Exam:   General:  Thin. No resp difficulty HEENT: normal Neck: supple. no JVD. Carotids 2+ bilat; no bruits. No lymphadenopathy or thryomegaly appreciated. Cor: PMI laterally displaced. Regular rate & rhythm. 2/6 MR Lungs: clear Abdomen: soft, nontender, nondistended. No hepatosplenomegaly. No bruits or masses. Good bowel sounds. Extremities: no cyanosis, clubbing, rash, edema Neuro: alert & orientedx3, cranial nerves grossly intact. moves all 4 extremities w/o difficulty. Affect pleasant   Recent Labs: 03/25/2022: B Natriuretic Peptide 614.3 04/21/2022: ALT 24; Magnesium 2.3 06/19/2022: BUN 18; Creatinine, Ser 1.23; Hemoglobin 13.0; Platelets 172.0; Potassium 4.1; Sodium 138; TSH 2.42  Personally reviewed   Wt Readings from Last 3 Encounters:  07/24/22 65.1 kg (143 lb 9.6 oz)  06/26/22 65.5 kg (144 lb 6.4 oz)  06/19/22 66.8 kg (147 lb 4 oz)      ASSESSMENT AND PLAN:  1. Chronic Systolic Heart Failure ICM ECHO 08/2013 EF 45-50%  Has St Jude ICD 2009 -  Echo 07/2016 LVEF 30-35%, Mod MR, Mod LAE, Mildly dilated RV with mild reduced RV  function, PA peak pressure 51 mm Hg.  - TEE 11/20 Left ventricular ejection fraction 20-25% Moderately reduced RV function 3+ MR severe TR - Echo 6/21 LV is markedly dilated with EF 15-20% severe MR. RV at least moderately reduced. Severe TR - CPX 11/20 with preserved VO2 with moderately elevated slope - CPX 7/21 Peak VO2: 27.5 (99% predicted peak VO2)  VE/VCO2 slope: 30. RER 0.95 - Echo 5/23 EF 25-30% severe MR RV moderately reduced - NYHA I-II - Volume ok.  Takes lasix every other day  - Off Toprol XL 12.5 mg with addition of amio and bradycardia - Continue candesartan 4 mg bid. Did not tolerate Entresto 24/26 bid - Continue Farxiga 10  - Continue spiro - Blood Type O positive - ICD interrogated in clinic with [employer] rep. Volume ok. No VT/AF. HR 40-60. RV pacing 2.2% - Difficult situation. He has severe biventricular dysfunction with severe MR/TR. But CPX from 7/21 was suprisingly good. previously reviewed his case with structural team and they feel he likely would not benefit from MitraClip (more like [place] patient). I also discussed with Dr. Mosetta Pigeon at [facility] and based on CPX and h/o relatively recent head & neck cancer risk of transplant/immunosuppression likely outweighed benefits of transplant given his pVO2.  2. VT/VF - ICD firing x 2 on 4/8. Had NSVT (12sec) on 4/11 which self-converted - on amio 200 daily and mexilitene  - Check labs  - has seen [name]. Discussed possible addition of atrial lead for bradycardia  3. PAF with ICD firing in 9/21 - in NSR. Continue amio - continue Eliquis  4. CAD S/P CABG 2006 - No s/s angina - Coronary angio 10/20 with stable revascularization. - Continue statin. Off ASA with Eliquis use.  5. Hyperlipidemia - Continue Crestor. Goal LDL < 70  - Per PCP  6. RBBB - stable   7. Throat Cancer - on R side. Squamous Cell Carcinoma with unclear primary.  - s/p chemo/XRT at [facility] in 2018 (Finished treatments in 2/19) - remains cancer free on recent eval  8. Severe mitral regurgitation - plan as above. Echo reviewed by Structural Team felt not to be candidate for MitraClip - no change  9. Weight loss - concern it may be related to HF but appetite preserved - will check thyroid panels (on amio) - encouraged protein supplements   Signed, Glori Bickers, MD  07/24/2022 2:07 PM  [facility] [place] [address] Heart and [place]  [phone] (office) [phone] (fax)

## 2022-07-24 NOTE — Patient Instructions (Signed)
Labs done today, your results will be available in MyChart, we will contact you for abnormal readings. ? ?Your physician recommends that you schedule a follow-up appointment in: 4 months ? ?If you have any questions or concerns before your next appointment please send us a message through mychart or call our office at [phone].   ? ?TO LEAVE A MESSAGE FOR THE NURSE SELECT OPTION 2, PLEASE LEAVE A MESSAGE INCLUDING: ?YOUR NAME ?DATE OF BIRTH ?CALL BACK NUMBER ?REASON FOR CALL**this is important as we prioritize the call backs ? ?YOU WILL RECEIVE A CALL BACK THE SAME DAY AS LONG AS YOU CALL BEFORE 4:00 PM ? ?At the [facility], you and your health needs are our priority. As part of our continuing mission to provide you with exceptional heart care, we have created designated Provider Care Teams. These Care Teams include your primary Cardiologist (physician) and Advanced Practice Providers (APPs- Physician Assistants and Nurse Practitioners) who all work together to provide you with the care you need, when you need it.  ? ?You may see any of the following providers on your designated Care Team at your next follow up: ?Dr Daniel Bensimhon ?Dr Dalton McLean ?[name], NP ?Brittainy Simmons, PA ?Jessica Milford,NP ?Lindsay Finch, PA ?Lauren Kemp, PharmD ? ? ?Please be sure to bring in all your medications bottles to every appointment.  ? ? ?

## 2022-07-25 DIAGNOSIS — C770 Secondary and unspecified malignant neoplasm of lymph nodes of head, face and neck: Secondary | ICD-10-CM | POA: Diagnosis not present

## 2022-07-25 DIAGNOSIS — C801 Malignant (primary) neoplasm, unspecified: Secondary | ICD-10-CM | POA: Diagnosis not present

## 2022-07-25 LAB — T3, FREE: T3, Free: 1.5 pg/mL — ABNORMAL LOW (ref 2.0–4.4)

## 2022-07-29 ENCOUNTER — Ambulatory Visit (INDEPENDENT_AMBULATORY_CARE_PROVIDER_SITE_OTHER): Payer: Medicare HMO

## 2022-07-29 DIAGNOSIS — I472 Ventricular tachycardia, unspecified: Secondary | ICD-10-CM

## 2022-07-29 LAB — CUP PACEART REMOTE DEVICE CHECK
Battery Remaining Longevity: 48 mo
Battery Remaining Percentage: 55 %
Battery Voltage: 2.95 V
Brady Statistic RV Percent Paced: 2.1 %
Date Time Interrogation Session: 20230815040017
HighPow Impedance: 47 Ohm
HighPow Impedance: 47 Ohm
Implantable Lead Implant Date: 20090618
Implantable Lead Location: 753860
Implantable Lead Model: 7121
Implantable Pulse Generator Implant Date: 20180220
Lead Channel Impedance Value: 400 Ohm
Lead Channel Pacing Threshold Amplitude: 1.25 V
Lead Channel Pacing Threshold Pulse Width: 1 ms
Lead Channel Sensing Intrinsic Amplitude: 3.1 mV
Lead Channel Setting Pacing Amplitude: 4 V
Lead Channel Setting Pacing Pulse Width: 1 ms
Lead Channel Setting Sensing Sensitivity: 0.5 mV
Pulse Gen Serial Number: 7406967

## 2022-08-07 ENCOUNTER — Ambulatory Visit (INDEPENDENT_AMBULATORY_CARE_PROVIDER_SITE_OTHER): Payer: Medicare HMO | Admitting: Pharmacist

## 2022-08-07 DIAGNOSIS — I509 Heart failure, unspecified: Secondary | ICD-10-CM

## 2022-08-07 DIAGNOSIS — I25810 Atherosclerosis of coronary artery bypass graft(s) without angina pectoris: Secondary | ICD-10-CM

## 2022-08-07 DIAGNOSIS — E785 Hyperlipidemia, unspecified: Secondary | ICD-10-CM

## 2022-08-07 NOTE — Chronic Care Management (AMB) (Signed)
Chronic Care Management Pharmacy Note  08/07/2022 Name:  Matthew WEBER Sr. MRN:  [id number] DOB:  1951/07/26  Summary:  Reviewed medication list. Reviewed out of pocket spend for 2023 - patient is currently at $701.48 and his wife's out of pocket spend for medications is only $5. Had not reached 3% required to apply for Eliquis / [place] medication assistance program which I estimate 3% of income is about $1440.  Will continue to follow  and assist with applying for patient assistance program for Eliquis if meets out of pocket requirement.   Subjective: Matthew Romans Sr. is an 71 y.o. year old male who is a primary patient of Paz, Alda Berthold, MD.  The CCM team was consulted for assistance with disease management and care coordination needs.    Engaged with patient by telephone for follow up visit in response to provider referral for pharmacy case management and/or care coordination services.   Consent to Services:  The patient was given information about Chronic Care Management services, agreed to services, and gave verbal consent prior to initiation of services.  Please see initial visit note for detailed documentation.   Patient Care Team: Colon Branch, MD as PCP - General Lovena Le Champ Mungo, MD as PCP - Electrophysiology (Cardiology) Bensimhon, Shaune Pascal, MD as Consulting Physician (Cardiology) Memory Argue, MD as Referring Physician [name], MD as Consulting Physician (Cardiology) Cherre Robins, [place] (Pharmacist)  Recent office visits: 06/18/2021 - PCP (Dr Larose Kells) Annual Physical. Labs checked. No med changes.  03/14/2021 - Lab - TSH elevated - Levothyroxine increased to 74mg daily  Recent consult visits: 07/24/2022 - Cardio (Dr BHaroldine Laws [facility] clinic. Difficult situation. He has severe biventricular dysfunction with severe MR/TR. But CPX from 7/21 was suprisingly good. previously reviewed his case with structural team and they feel he likely would not  benefit from MitraClip (more like MFisherpatient). I also [facility] and h/o relatively recent head & neck cancer risk of transplant/immunosuppression likely outweighed benefits of transplant given his pVO2.  No med changes noted. 04/21/2022 - Cardio ([place], PAC) CHF / ICD shocks. Patient reported that he sis not increase amiodarone to BID. Med list still has BID but notes states "Has been managed on low dose amiodarone. Mexitil recently added Had recent therapy 4/14 and 4/24 Made VT schemes more aggressive, CL 85 -> 81%. 12 stimuli instead of 8, 3 bursts in both VT 1 and VT 2, VF detection interval decreased" 03/25/2022 - Cardio (Dr BHaroldine Laws F/U CHF; ICD noted firing x 2 on 03/22/2022 and 03/25/2022. Back up HR set at 40 on ICD.  Increased amiodarone to 2084mtwice a day. Recommended use furosemide as needed only. Continue candasartan, Farxiga and spironolactone. with unsuccessful ATP.  . 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 Continue fluoride trays nightly and see his dentist biannually.   Hospital visits: None in previous 6 months  Objective:  Lab Results  Component Value Date   CREATININE 1.30 (H) 07/24/2022   CREATININE 1.23 06/19/2022   CREATININE 1.19 04/21/2022    Lab Results  Component Value Date   HGBA1C 5.9 06/19/2022   Last diabetic Eye exam: No results found for: "HMDIABEYEEXA"  Last diabetic Foot exam: No results  found for: "HMDIABFOOTEX"      Component Value Date/Time   CHOL 123 06/19/2022 0832   TRIG 52.0 06/19/2022 0832   TRIG 45 12/16/2006 0912   HDL 59.10 06/19/2022 0832   CHOLHDL 2 06/19/2022 0832   VLDL 10.4 06/19/2022 0832   LDLCALC 54 06/19/2022 0832       Latest Ref Rng & Units  07/24/2022    2:27 PM 04/21/2022    8:57 AM 04/25/2021   11:33 AM  Hepatic Function  Total Protein 6.5 - 8.1 g/dL 5.9  6.2  6.5   Albumin 3.5 - 5.0 g/dL 3.6  4.2  3.9   AST 15 - 41 U/L 26  23  34   ALT 0 - 44 U/L 32  24  27   Alk Phosphatase 38 - 126 U/L 78  80  55   Total Bilirubin 0.3 - 1.2 mg/dL 1.0  0.8  1.9     Lab Results  Component Value Date/Time   TSH 2.219 07/24/2022 02:27 PM   TSH 2.42 06/19/2022 08:32 AM   TSH 2.04 10/30/2021 07:11 AM   FREET4 1.65 (H) 07/24/2022 02:27 PM   FREET4 0.91 06/14/2020 08:36 AM       Latest Ref Rng & Units 07/24/2022    2:27 PM 06/19/2022    8:32 AM 04/25/2021   11:33 AM  CBC  WBC 4.0 - 10.5 K/uL 6.3  5.0  5.9   Hemoglobin 13.0 - 17.0 g/dL 13.3  13.0  14.7   Hematocrit 39.0 - 52.0 % 42.8  40.8  45.8   Platelets 150 - 400 K/uL 187  172.0  201     No results found for: "VD25OH"  Clinical ASCVD: Yes  The ASCVD Risk score (Arnett DK, et al., 2019) failed to calculate for the following reasons:   The valid total cholesterol range is 130 to 320 mg/dL    Other: CHADSVAsc = 5  Social History   Tobacco Use  Smoking Status Former   Packs/day: 0.10   Types: Cigarettes   Quit date: 12/16/1999   Years since quitting: 22.6  Smokeless Tobacco Never   BP Readings from Last 3 Encounters:  07/24/22 (!) 90/50  06/26/22 104/66  06/19/22 122/86   Pulse Readings from Last 3 Encounters:  07/24/22 (!) 43  06/26/22 (!) 43  06/19/22 (!) 46   Wt Readings from Last 3 Encounters:  07/24/22 143 lb 9.6 oz (65.1 kg)  06/26/22 144 lb 6.4 oz (65.5 kg)  06/19/22 147 lb 4 oz (66.8 kg)    Assessment: Review of patient past medical history, allergies, medications, health status, including review of consultants reports, laboratory and other test data, was performed as part of comprehensive evaluation and provision of chronic care management services.   SDOH:  (Social Determinants of Health) assessments and interventions performed:      CCM Care  Plan  Allergies  Allergen Reactions   Atorvastatin      myalgia, Muscle aching   Entresto [Sacubitril-Valsartan] Itching    Had itching and redness   Sulfonamide Derivatives     Rash and hot flashes   Sulfamethoxazole Rash and Other (See Comments)    Hot flashes   Tape Other (See Comments)    Rips skin- use PAPER tape    Medications Reviewed Today     Reviewed by Michelle Nasuti, [place] (Certified Medical Assistant) on 06/26/22 at 0755  Med List Status: <None>   Medication Order Taking? Sig Documenting Provider Last Dose Status Informant  acetaminophen (TYLENOL) 325 MG tablet [id number]  Take 650 mg by mouth every 6 (six) hours as needed (for pain.). [provider]  Active Self           Med Note ([place] Oct 12, 2019  2:03 PM)    amiodarone (PACERONE) 200 MG tablet [id number]  Take 1 tablet (200 mg total) by mouth 2 (two) times daily.  Patient taking differently: Take 200 mg by mouth daily.   Bensimhon, Shaune Pascal, MD  Active   candesartan (ATACAND) 8 MG tablet [id number]  TAKE 0.5 TABLETS (4 MG TOTAL) BY MOUTH 2 (TWO) TIMES DAILY. Bensimhon, Shaune Pascal, MD  Active   carboxymethylcellul-glycerin (OPTIVE) 0.5-0.9 % ophthalmic solution [id number]  Place 1 drop into both eyes daily. [provider]  Active Self  cetirizine (ZYRTEC) 10 MG tablet [id number]  Take 10 mg by mouth as needed for allergies. [provider]  Active Self  Colchicine (MITIGARE) 0.6 MG CAPS [id number]  Take 0.6 mg by mouth 2 (two) times daily as needed (gout flare). Colon Branch, MD  Active            Med Note Juliette Alcide Jul 02, 2021  2:32 PM)    dapagliflozin propanediol (FARXIGA) 10 MG TABS tablet [id number]  TAKE 1 TABLET BY MOUTH EVERY DAY BEFORE BREAKFAST Colon Branch, MD  Active            Med Note Madaline Brilliant Apr 21, 2022  8:08 AM)    Arne Cleveland 5 MG TABS tablet [id number]  TAKE 1 TABLET BY MOUTH TWICE A DAY [name], MD  Active   furosemide  (LASIX) 40 MG tablet [id number]  TAKE 1 TABLET BY MOUTH EVERY DAY  Patient taking differently: Take 40 mg by mouth every other day.   Bensimhon, Shaune Pascal, MD  Active   levothyroxine (SYNTHROID) 88 MCG tablet [id number]  Take 1 tablet (88 mcg total) by mouth daily before breakfast. Colon Branch, MD  Active   mexiletine (MEXITIL) 250 MG capsule [id number]  TAKE 1 CAPSULE BY MOUTH 2 TIMES DAILY. Shirley Friar, PA-C  Active   nitroGLYCERIN (NITROSTAT) 0.4 MG SL tablet [id number]  Place 1 tablet (0.4 mg total) under the tongue every 5 (five) minutes x 3 doses as needed for chest pain.  Patient not taking: Reported on 06/19/2022   Colon Branch, MD  Active            Med Note [facility]   Thu Jun 19, 2022  8:02 AM) PRN  Omega-3 Fatty Acids (FISH OIL) 1200 MG CAPS [id number]  Take 3,600 mg by mouth daily. [provider]  Active Self  rosuvastatin (CRESTOR) 40 MG tablet [id number]  TAKE 1 TABLET BY MOUTH EVERYDAY AT BEDTIME Colon Branch, MD  Active   spironolactone (ALDACTONE) 25 MG tablet [id number]  TAKE 1/2 TABLET BY MOUTH EVERY DAY Bensimhon, Shaune Pascal, MD  Active             Patient Active Problem List   Diagnosis Date Noted   Presence of implantable cardioverter-defibrillator (ICD) 06/26/2022   VT (ventricular tachycardia) ([place]) 07/02/2021   Paroxysmal atrial fibrillation ([place]) 07/02/2021   Heme + stool    Internal and external prolapsed hemorrhoids    Difficulty swallowing 01/21/2018   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 [facility]) 12/24/2017   Malignant neoplasm metastatic to lymph nodes  of neck with unknown primary site [facility]) 11/12/2017   PCP NOTES >>>>>>>>>>>>>>>>>>>>> 04/21/2016   Seborrheic dermatitis of scalp 04/11/2013   Annual physical exam 04/11/2011   MITRAL REGURGITATION 34/19/6222   SYSTOLIC HEART FAILURE, CHRONIC 10/03/2009   Hyperglycemia 06/19/2008   Coronary atherosclerosis 06/14/2008   Automatic implantable  cardioverter-defibrillator in situ 06/01/2008   ERECTILE DYSFUNCTION 12/17/2007   Hyperlipidemia 02/06/2007   Gout 02/06/2007   RBBB 02/06/2007   Congestive heart failure ([place]) 02/06/2007   CORONARY ARTERY BYPASS GRAFT, FOUR VESSEL, HX OF 02/12/2005    Immunization History  Administered Date(s) Administered   Fluad Quad(high Dose 65+) 10/25/2020   Influenza Split 10/10/2011, 10/11/2012   Influenza Whole 11/18/2007, 09/27/2008, 09/27/2009, 08/20/2010   Influenza, High Dose Seasonal PF 11/25/2016, 10/22/2017, 10/21/2018, 10/19/2019, 10/14/2021   Influenza,inj,Quad PF,6+ Mos 10/13/2013, 10/24/2014, 11/20/2015   PFIZER(Purple Top)SARS-COV-2 Vaccination 02/03/2020, 02/22/2020, 09/18/2020   Pneumococcal Conjugate-13 04/13/2014   Pneumococcal Polysaccharide-23 03/07/2009, 11/20/2015   Td 12/15/2006, 04/27/2017   Zoster Recombinat (Shingrix) 04/30/2018, 07/02/2018   Zoster, Live 04/11/2013    Conditions to be addressed/monitored: CHF, CAD, HTN, HLD and Thyroid disease, gout, metastatic throat cancer; pre diabetes  Care Plan : General Pharmacy (Adult)  Updates made by Cherre Robins, RPH-CPP since 08/07/2022 12:00 AM     Problem: Medication Adherence (Wellness)      Long-Range Goal: Medication Adherence Maintained   Start Date: 03/14/2021  This Visit's Progress: On track  Recent Progress: On track  Priority: High  Note:   Current Barriers:  Suboptimal therapeutic regimen for thyroid disease Usually enters coverage gap in May / June  Pharmacist Clinical Goal(s):  Over the next 180 days, patient will verbalize ability to afford treatment regimen adhere to plan to optimize therapeutic regimen for thyroid disease, CHF, HTN and lipids as evidenced by report of adherence to recommended medication management changes through collaboration with PharmD and provider.  Working with clinical pharmacist to apply for patient assistance for Farxiga and Eliquis  Interventions: 1:1 collaboration  with Colon Branch, MD regarding development and update of comprehensive plan of care as evidenced by provider attestation and co-signature Inter-disciplinary care team collaboration (see longitudinal plan of care) Comprehensive medication review performed; medication list updated in electronic medical record  Pre - Diabetes: Controlled; current treatment: diet and exercise (he is taking Farxiga 3103m daily but this is mostly for CHF) Current glucose readings: does not monitor BG at home Current exercise: frequent golfing and some walking  Hypertension / CHF: Controlled; Goal blood pressure < 140/90 and control CHF symptoms and prevent exacerbations current treatment:  candasartan 875m- take 0.5 tablet = 103m42mwice a day furosemide 60m41mery other day spironolactone 25mg16make 0.5 tablet daily Farxiga 10mg 88my (receiving through PAP until 12/14/2022) Current home readings: 120's  / 60's Unable to tolerate Entresto - caused itching and redness Interventions:  Discussed signs and symptoms of CHF exacerbation - weight gain, SOB, abdominal fullness, swelling in legs or abdomen, Fatigue and weakness, changes in ability to perform usual activities, persistent cough or wheezing with white or pink blood-tinged mucus, nausea and lack of appetite Continue to weigh daily - report weight gain of more than 3 lbs in 24 hours or 5 lbs in 1 week. Recommended continue current therapy  Hyperlipidemia: Controlled; LDL goal < 70 Current treatment: rosuvastatin 60mg d47m  Medications previously tried: atorvastatin   Recommended continue current therapy   Atrial Fibrillation / VT: Controlled Current rate/rhythm control: amiodarone 200mg tw6103ma day Mexiletine 250mg twi56m  a day Anticoagulant treatment:  Eliquis 32m twice daily CHADS2VASc score: 4 Patient usually hits Medicare Coverage gap in May or June. Per patient has not hit yet.Applied for Eliquis PAP but due to 2021 income patient was not  approved.  Current out of pocket medication expense if $413 (patient only)  Interventions:  Recommended continue current therapy Plan to try for PAP again in 2023 as patient's financial situation was different in 2022 than 2021.  Will check back in with patient later in 2023 as he has to spend 3% of household income on medication to qualify for patient assistance program for Eliquis. Has not met 3% spend yet.  Patient Goals/Self-Care Activities Over the next 180 days, patient will:  take medications as prescribed  collaborate with provider on medication access solutions. Continue to exercise with goal of 150 minutes or more per week as able.   Follow Up Plan: Telephone follow up appointment with care management team member scheduled for:  Clinical pharmacist will follow up in 2 to 3 months as unless needed sooner.          Medication Assistance:  FWilder Gladeobtained through [facility] program.  Enrollment ends 12/14/2022     Applied for medication assistance for Eliquis in 2022 but patient was denied (based on 2021 income)   [facility] [address]Phone: [phone]: [address] [address] [place]Phone: [phone]: [phone]  Follow Up:  Patient agrees to Care Plan and Follow-up.  Plan: Telephone follow up appointment with care management team member scheduled for:   2 to 3 months .   TCherre Robins PharmD Clinical Pharmacist [facility]

## 2022-08-07 NOTE — Patient Instructions (Signed)
Mr. Borton It was a pleasure speaking with you today.  Below is a summary of your health goals and summary of our recent visit. You can also view your updated Chronic Care Management Care plan through your MyChart account.    Patient Goals/Self-Care Activities  take medications as prescribed  collaborate with provider on medication access solutions.  Continue to exercise with goal of 150 minutes or more per week as able.   As always if you have any questions or concerns especially regarding medications, please feel free to contact me either at the phone number below or with a MyChart message.   Keep up the good work!  Cherre Robins, PharmD Clinical Pharmacist [facility] [phone] (direct line)  [phone] (main office number)   Patient verbalizes understanding of instructions and care plan provided today and agrees to view in [place]. Active MyChart status and patient understanding of how to access instructions and care plan via MyChart confirmed with patient.

## 2022-08-10 ENCOUNTER — Other Ambulatory Visit: Payer: Self-pay | Admitting: Internal Medicine

## 2022-08-12 ENCOUNTER — Encounter: Payer: Self-pay | Admitting: Cardiology

## 2022-08-14 DIAGNOSIS — I509 Heart failure, unspecified: Secondary | ICD-10-CM | POA: Diagnosis not present

## 2022-08-14 DIAGNOSIS — E785 Hyperlipidemia, unspecified: Secondary | ICD-10-CM

## 2022-08-14 DIAGNOSIS — I2510 Atherosclerotic heart disease of native coronary artery without angina pectoris: Secondary | ICD-10-CM | POA: Diagnosis not present

## 2022-08-14 DIAGNOSIS — I110 Hypertensive heart disease with heart failure: Secondary | ICD-10-CM

## 2022-09-01 NOTE — Progress Notes (Signed)
Remote ICD transmission.   

## 2022-09-30 ENCOUNTER — Ambulatory Visit (INDEPENDENT_AMBULATORY_CARE_PROVIDER_SITE_OTHER): Payer: Medicare HMO | Admitting: Internal Medicine

## 2022-09-30 ENCOUNTER — Encounter: Payer: Self-pay | Admitting: Internal Medicine

## 2022-09-30 VITALS — BP 106/68 | HR 49 | Temp 98.0°F | Resp 16 | Ht 69.0 in | Wt 145.5 lb

## 2022-09-30 DIAGNOSIS — R634 Abnormal weight loss: Secondary | ICD-10-CM

## 2022-09-30 DIAGNOSIS — E038 Other specified hypothyroidism: Secondary | ICD-10-CM

## 2022-09-30 DIAGNOSIS — I509 Heart failure, unspecified: Secondary | ICD-10-CM

## 2022-09-30 LAB — T3, FREE: T3, Free: 2.7 pg/mL (ref 2.3–4.2)

## 2022-09-30 LAB — TSH: TSH: 1.64 u[IU]/mL (ref 0.35–5.50)

## 2022-09-30 LAB — T4, FREE: Free T4: 1.61 ng/dL — ABNORMAL HIGH (ref 0.60–1.60)

## 2022-09-30 NOTE — Progress Notes (Signed)
Subjective:    Patient ID: Matthew Romans Sr., male    DOB: [birth year], 71 y.o.   MRN: [id number]  DOS:  09/30/2022 Type of visit - description: Follow-up  Since the last office visit is doing well. Weight at home is low but is not getting much worse.  Saw cardiology, note reviewed  Denies chest pain or difficulty breathing.  No palpitations or tremors No anxiety or depression  Wt Readings from Last 3 Encounters:  09/30/22 145 lb 8 oz (66 kg)  07/24/22 143 lb 9.6 oz (65.1 kg)  06/26/22 144 lb 6.4 oz (65.5 kg)   Review of Systems See above   Past Medical History:  Diagnosis Date   AICD (automatic cardioverter/defibrillator) present 6/09   St. Jude   Bradycardia    Use of beta blocker limited   CAD (coronary artery disease)    a- S/p cabg in march 2006 by Dr.Owen;  b. myoview 5/12: large IL scar from apex to base, no ischemia, EF 37%   Cancer (HCC)    on right side of neck   ED (erectile dysfunction)    Gout    Hyperlipidemia    Ischemic cardiomyopathy    a-Echocardiogram, Nov 2006, showed ejection fraction of 30-40% with mild to moderated mitral  regurgitation and mild aortic regurgitation b- Cardiac MRI, May 2007, EF of 44% with 50% scar involving  the inferolateral walls. No comment of mitral regurgitation. c- last echo  11/10: EF 30-35%  mod MR d- Nega T-Wave alternans testing in May of 2007 e- no ACE due to low BP;  f. CPX 3/11: normal   RBBB (right bundle branch block with left anterior fascicular block)    Systolic CHF, chronic (HCC)    Thyroid disease     Past Surgical History:  Procedure Laterality Date   CARDIAC DEFIBRILLATOR PLACEMENT  06/01/08   ICD   COLONOSCOPY     COLONOSCOPY WITH PROPOFOL N/A 07/28/2019   Procedure: COLONOSCOPY WITH PROPOFOL;  Surgeon: [name], MD;  Location: [facility];  Service: Endoscopy;  Laterality: N/A;   CORONARY ARTERY BYPASS GRAFT  02-2005   ICD GENERATOR CHANGEOUT N/A 02/03/2017   Procedure: ICD Generator Changeout;   Surgeon: [name], MD;  Location: [place] [facility];  Service: Cardiovascular;  Laterality: N/A;   ORIF ANKLE FRACTURE  1987   left    RIGHT/LEFT HEART CATH AND CORONARY/GRAFT ANGIOGRAPHY N/A 09/20/2019   Procedure: RIGHT/LEFT HEART CATH AND CORONARY/GRAFT ANGIOGRAPHY;  Surgeon: [name], MD;  Location: [place] [facility];  Service: Cardiovascular;  Laterality: N/A;   SHOULDER ARTHROSCOPY  02/2010   rt    TEE WITHOUT CARDIOVERSION N/A 10/24/2019   Procedure: TRANSESOPHAGEAL ECHOCARDIOGRAM (TEE);  Surgeon: [name], MD;  Location: [facility] ENDOSCOPY;  Service: Cardiovascular;  Laterality: N/A;   TONSILLECTOMY     as a child    Current Outpatient Medications  Medication Instructions   acetaminophen (TYLENOL) 650 mg, Oral, Every 6 hours PRN   amiodarone (PACERONE) 200 mg, Oral, Daily   candesartan (ATACAND) 4 mg, Oral, 2 times daily   carboxymethylcellul-glycerin (OPTIVE) 0.5-0.9 % ophthalmic solution 1 drop, Both Eyes, Daily   cetirizine (ZYRTEC) 10 mg, Oral, As needed,     Colchicine (MITIGARE) 0.6 mg, Oral, 2 times daily PRN   dapagliflozin propanediol (FARXIGA) 10 MG TABS tablet TAKE 1 TABLET BY MOUTH EVERY DAY BEFORE BREAKFAST   ELIQUIS 5 MG TABS tablet TAKE 1 TABLET BY MOUTH TWICE A DAY  Fish Oil 3,600 mg, Oral, Daily   furosemide (LASIX) 40 mg, Oral, Every other day   levothyroxine (SYNTHROID) 88 mcg, Oral, Daily before breakfast   mexiletine (MEXITIL) 250 mg, Oral, 2 times daily   nitroGLYCERIN (NITROSTAT) 0.4 mg, Sublingual, Every 5 min x3 PRN   rosuvastatin (CRESTOR) 40 MG tablet TAKE 1 TABLET BY MOUTH EVERYDAY AT BEDTIME   spironolactone (ALDACTONE) 25 MG tablet TAKE 1/2 TABLET BY MOUTH EVERY DAY       Objective:   Physical Exam BP 106/68   Pulse (!) 49   Temp 98 F (36.7 C) (Oral)   Resp 16   Ht '5\' 9"'$  (1.753 m)   Wt 145 lb 8 oz (66 kg)   SpO2 98%   BMI 21.49 kg/m  General:   Well developed, NAD, BMI noted. HEENT:  Normocephalic . Face  symmetric, atraumatic Lungs:  CTA B Normal respiratory effort, no intercostal retractions, no accessory muscle use. Heart: bradycardic.  Lower extremities: no pretibial edema bilaterally  Skin: Not pale. Not jaundice Neurologic:  alert & oriented X3.  Speech normal, gait appropriate for age and unassisted Psych--  Cognition and judgment appear intact.  Cooperative with normal attention span and concentration.  Behavior appropriate. No anxious or depressed appearing.      Assessment    Assessment Prediabetes Hyperlipidemia Gout Hypothyroidism-  previously subclinical dz, then rx amidarone 08-2020 and TSH increased to 15 12/2020, started levothyroxine E.D. 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 S/p chemo x 6, XRT x 35; treatment ended 01/2018  PLAN: CAD, cardiomyopathy: Seen at the  CHF clinic 07/24/2022, noted to have severe biventricular dysfunction, severe MR/TR.  No surgical or invasive options, noted to be in NSR.  No anginal symptoms. On amiodarone, TFTs checked: Free T4 slightly high, free T3 is slightly low Hypothyroidism: Chart is extensively review, he had subclinical hypothyroidism, was Rx amiodarone 08/2020, subsequently TSH increased to 15 on January 2022 and he was started levothyroxine. Last TFTs show normal TSH but slightly elevated Free T4. Plan: Check TFTs, further advised with results. Weight loss: It has stabilized, despite having some issues swallowing from previous neck cancer he is eating plenty.  Weight at home range from 139 pounds to 142 pounds. He is trying to eat high-protein shakes. EtOH:  he was drinking heavily lately, he realized it and stopped all EtOH few months ago and feels much better. Preventive care.  Plans to proceed with a flu shot, declines  COVID-vaccine, benefits discussed RTC 4 months

## 2022-09-30 NOTE — Assessment & Plan Note (Signed)
CAD, cardiomyopathy: Seen at the  CHF clinic 07/24/2022, noted to have severe biventricular dysfunction, severe MR/TR.  No surgical or invasive options, noted to be in NSR.  No anginal symptoms. On amiodarone, TFTs checked: Free T4 slightly high, free T3 is slightly low Hypothyroidism: Chart is extensively review, he had subclinical hypothyroidism, was Rx amiodarone 08/2020, subsequently TSH increased to 15 on January 2022 and he was started levothyroxine. Last TFTs show normal TSH but slightly elevated Free T4. Plan: Check TFTs, further advised with results. Weight loss: It has stabilized, despite having some issues swallowing from previous neck cancer he is eating plenty.  Weight at home range from 139 pounds to 142 pounds. He is trying to eat high-protein shakes. EtOH:  he was drinking heavily lately, he realized it and stopped all EtOH few months ago and feels much better. Preventive care.  Plans to proceed with a flu shot, declines COVID-vaccine, benefits discussed RTC 4 months

## 2022-09-30 NOTE — Patient Instructions (Addendum)
Vaccines I recommend:  Covid booster Flu shot   Check the  blood pressure regularly BP GOAL is between 110/65 and  135/85. If it is consistently higher or lower, let me know  GO TO THE LAB : Get the blood work     [place], [place] back for   a checkup in 4 months

## 2022-10-16 ENCOUNTER — Ambulatory Visit: Payer: Medicare HMO

## 2022-10-16 ENCOUNTER — Ambulatory Visit (INDEPENDENT_AMBULATORY_CARE_PROVIDER_SITE_OTHER): Payer: Medicare HMO | Admitting: *Deleted

## 2022-10-16 DIAGNOSIS — Z0000 Encounter for general adult medical examination without abnormal findings: Secondary | ICD-10-CM | POA: Diagnosis not present

## 2022-10-16 NOTE — Patient Instructions (Signed)
[name] , Thank you for taking time to come for your Medicare Wellness Visit. I appreciate your ongoing commitment to your health goals. Please review the following plan we discussed and let me know if I can assist you in the future.   These are the goals we discussed:  Goals      A1c goal <6.5%     -Pre-Diabetes a1c range is 5.7% to 6.4%. Your most recent a1c was 6.4% on 06/09/19.  -Usually the full diabetes diagnosis is given when a1c reaches 6.5% or higher.      Chronic Care Management Pharmacy Care Plan     Chronic Care Management Care Plan   Hypertension Screening BP Readings from Last 3 Encounters:  07/24/22 (!) 90/50  06/26/22 104/66  06/19/22 122/86  Pharmacist Clinical Goal(s): Over the next 180 days, patient will work with PharmD and providers to maintain BP goal <130/80 Current regimen:  Candesartan '8mg'$  - take 0.5 tablet = '4mg'$  twice a day  Spironolactone '25mg'$  - take 0.5 tablet daily Patient self care activities - Over the next 180 days, patient will: Maintain hypertension medication regimen. Continue to check blood pressure and heart rate every other day  Irregular Heart Rate: Pharmacist Clinical Goal(s): Over the next 180 days, patient will work with PharmD and providers to maintain heart rate in normal sinus rhythm and prevent stroke; Also will work with patient to apply for assistance with Eliquis cost Current regimen:  Eliquis '5mg'$  take 1 tablet twice a day Amiodarone '200mg'$  twice a day Mexiletine '250mg'$  twice a day Patient self care activities - Over the next 180 days, patient will: Maintain amiodarone and mexiletine for heart rate / rhythm and continue to take Eliquis for stroke prevention  Report any signs of bleeding  Hyperlipidemia Lab Results  Component Value Date/Time   LDLCALC 54 06/19/2022 08:32 AM  Pharmacist Clinical Goal(s): Over the next 180 days, patient will work with PharmD and providers to maintain LDL goal < 70 Current regimen:   Rosuvastatin '40mg'$  daily Patient self care activities - Over the next 180 days, patient will: Maintain cholesterol medication regimen.   Pre-Diabetes Lab Results  Component Value Date/Time   HGBA1C 5.9 06/19/2022 08:32 AM   HGBA1C 6.0 06/18/2021 08:33 AM  Pharmacist Clinical Goal(s): Over the next 180 days, patient will work with PharmD and providers to maintain A1c goal <6.5% Current regimen:  [place] '10mg'$  daily (primarily for heart failure diagnosis) Interventions: Discussed diet and exercise Discussed patient assistance options now that patient is in Medicare coverage gap Patient self care activities - Over the next 180 days, patient will: Maintain a1c <6.5%   Heart Failure Pharmacist Clinical Goal(s) Over the next 180 days, patient will work with PharmD and providers to reduce symptoms of heart failure Current regimen:  Candesartan '8mg'$  - take 0.5 tablet = '4mg'$  twice a day  Farxiga '10mg'$  daily Furosemide '40mg'$  every other day Spironolactone '25mg'$  - take 0.5 tablet daily  Interventions: Reviewed importance of daily weights and when to report weight gain  More than 3lbs in 1 day  More than 5lbs in 1 week  Contact cardio, primary care, or pharmacy team Patient self care activities - Over the next 180 days, patient will: Weigh daily Maintain heart failure medication regimen    Medication management Pharmacist Clinical Goal(s): Over the next 180 days, patient will work with PharmD and providers to maintain optimal medication adherence Current pharmacy: [facility] Interventions Comprehensive medication review performed. Continue current medication management strategy Discussed options for assistance  when he reaches Medicare coverage gap.  Patient self care activities - Over the next 180 days, patient will: Focus on medication adherence by filling and taking medications appropriately Take medications as prescribed Report any questions or concerns to PharmD and/or  provider(s)  Patient Goals/Self-Care Activities  take medications as prescribed  collaborate with provider on medication access solutions.  Continue to exercise with goal of 150 minutes or more per week as able.   Please see past updates related to this goal by clicking on the "Past Updates" button in the selected goal      LDL goal <70     Limit the amount of carbohydrates eaten     Patient Stated     Increase activity     Remember take candesartan '8mg'$  as 1/2 tablet twice daily        This is a list of the screening recommended for you and due dates:  Health Maintenance  Topic Date Due   COVID-19 Vaccine (4 - Pfizer risk series) 11/13/2020   Medicare Annual Wellness Visit  10/17/2023   Tetanus Vaccine  04/28/2027   Colon Cancer Screening  07/27/2029   Pneumonia Vaccine  Completed   Flu Shot  Completed   Hepatitis C Screening: USPSTF Recommendation to screen - Ages 18-79 yo.  Completed   Zoster (Shingles) Vaccine  Completed   HPV Vaccine  Aged Out     Next appointment: Follow up in one year for your annual wellness visit.   Preventive Care 53 Years and Older, Male Preventive care refers to lifestyle choices and visits with your health care provider that can promote health and wellness. What does preventive care include? A yearly physical exam. This is also called an annual well check. Dental exams once or twice a year. Routine eye exams. Ask your health care provider how often you should have your eyes checked. Personal lifestyle choices, including: Daily care of your teeth and gums. Regular physical activity. Eating a healthy diet. Avoiding tobacco and drug use. Limiting alcohol use. Practicing safe sex. Taking low doses of aspirin every day. Taking vitamin and mineral supplements as recommended by your health care provider. What happens during an annual well check? The services and screenings done by your health care provider during your annual well check will  depend on your age, overall health, lifestyle risk factors, and family history of disease. Counseling  Your health care provider may ask you questions about your: Alcohol use. Tobacco use. Drug use. Emotional well-being. Home and relationship well-being. Sexual activity. Eating habits. History of falls. Memory and ability to understand (cognition). Work and work Statistician. Screening  You may have the following tests or measurements: Height, weight, and BMI. Blood pressure. Lipid and cholesterol levels. These may be checked every 5 years, or more frequently if you are over 45 years old. Skin check. Lung cancer screening. You may have this screening every year starting at age 74 if you have a 30-pack-year history of smoking and currently smoke or have quit within the past 15 years. Fecal occult blood test (FOBT) of the stool. You may have this test every year starting at age 2. Flexible sigmoidoscopy or colonoscopy. You may have a sigmoidoscopy every 5 years or a colonoscopy every 10 years starting at age 70. Prostate cancer screening. Recommendations will vary depending on your family history and other risks. Hepatitis C blood test. Hepatitis B blood test. Sexually transmitted disease (STD) testing. Diabetes screening. This is done by checking your blood sugar (glucose)  after you have not eaten for a while (fasting). You may have this done every 1-3 years. Abdominal aortic aneurysm (AAA) screening. You may need this if you are a current or former smoker. Osteoporosis. You may be screened starting at age 54 if you are at high risk. Talk with your health care provider about your test results, treatment options, and if necessary, the need for more tests. Vaccines  Your health care provider may recommend certain vaccines, such as: Influenza vaccine. This is recommended every year. Tetanus, diphtheria, and acellular pertussis (Tdap, Td) vaccine. You may need a Td booster every 10  years. Zoster vaccine. You may need this after age 50. Pneumococcal 13-valent conjugate (PCV13) vaccine. One dose is recommended after age 11. Pneumococcal polysaccharide (PPSV23) vaccine. One dose is recommended after age 88. Talk to your health care provider about which screenings and vaccines you need and how often you need them. This information is not intended to replace advice given to you by your health care provider. Make sure you discuss any questions you have with your health care provider. Document Released: 12/28/2015 Document Revised: 08/20/2016 Document Reviewed: 10/02/2015 Elsevier Interactive Patient Education  2017 [place] Prevention in the Home Falls can cause injuries. They can happen to people of all ages. There are many things you can do to make your home safe and to help prevent falls. What can I do on the outside of my home? Regularly fix the edges of walkways and driveways and fix any cracks. Remove anything that might make you trip as you walk through a door, such as a raised step or threshold. Trim any bushes or trees on the path to your home. Use bright outdoor lighting. Clear any walking paths of anything that might make someone trip, such as rocks or tools. Regularly check to see if handrails are loose or broken. Make sure that both sides of any steps have handrails. Any raised decks and porches should have guardrails on the edges. Have any leaves, snow, or ice cleared regularly. Use sand or salt on walking paths during winter. Clean up any spills in your garage right away. This includes oil or grease spills. What can I do in the bathroom? Use night lights. Install grab bars by the toilet and in the tub and shower. Do not use towel bars as grab bars. Use non-skid mats or decals in the tub or shower. If you need to sit down in the shower, use a plastic, non-slip stool. Keep the floor dry. Clean up any water that spills on the floor as soon as it  happens. Remove soap buildup in the tub or shower regularly. Attach bath mats securely with double-sided non-slip rug tape. Do not have throw rugs and other things on the floor that can make you trip. What can I do in the bedroom? Use night lights. Make sure that you have a light by your bed that is easy to reach. Do not use any sheets or blankets that are too big for your bed. They should not hang down onto the floor. Have a firm chair that has side arms. You can use this for support while you get dressed. Do not have throw rugs and other things on the floor that can make you trip. What can I do in the kitchen? Clean up any spills right away. Avoid walking on wet floors. Keep items that you use a lot in easy-to-reach places. If you need to reach something above you, use a  strong step stool that has a grab bar. Keep electrical cords out of the way. Do not use floor polish or wax that makes floors slippery. If you must use wax, use non-skid floor wax. Do not have throw rugs and other things on the floor that can make you trip. What can I do with my stairs? Do not leave any items on the stairs. Make sure that there are handrails on both sides of the stairs and use them. Fix handrails that are broken or loose. Make sure that handrails are as long as the stairways. Check any carpeting to make sure that it is firmly attached to the stairs. Fix any carpet that is loose or worn. Avoid having throw rugs at the top or bottom of the stairs. If you do have throw rugs, attach them to the floor with carpet tape. Make sure that you have a light switch at the top of the stairs and the bottom of the stairs. If you do not have them, ask someone to add them for you. What else can I do to help prevent falls? Wear shoes that: Do not have high heels. Have rubber bottoms. Are comfortable and fit you well. Are closed at the toe. Do not wear sandals. If you use a stepladder: Make sure that it is fully opened.  Do not climb a closed stepladder. Make sure that both sides of the stepladder are locked into place. Ask someone to hold it for you, if possible. Clearly mark and make sure that you can see: Any grab bars or handrails. First and last steps. Where the edge of each step is. Use tools that help you move around (mobility aids) if they are needed. These include: Canes. Walkers. Scooters. Crutches. Turn on the lights when you go into a dark area. Replace any light bulbs as soon as they burn out. Set up your furniture so you have a clear path. Avoid moving your furniture around. If any of your floors are uneven, fix them. If there are any pets around you, be aware of where they are. Review your medicines with your doctor. Some medicines can make you feel dizzy. This can increase your chance of falling. Ask your doctor what other things that you can do to help prevent falls. This information is not intended to replace advice given to you by your health care provider. Make sure you discuss any questions you have with your health care provider. Document Released: 09/27/2009 Document Revised: 05/08/2016 Document Reviewed: 01/05/2015 Elsevier Interactive Patient Education  2017 [employer].

## 2022-10-16 NOTE — Progress Notes (Addendum)
Subjective:   Matthew Romans Sr. is a 71 y.o. male who presents for Medicare Annual/Subsequent preventive examination.  I connected with  Matthew Romans Sr. on 10/16/22 by a audio enabled telemedicine application and verified that I am speaking with the correct person using two identifiers.  Patient Location: Home  Provider Location: Office/Clinic  I discussed the limitations of evaluation and management by telemedicine. The patient expressed understanding and agreed to proceed.   Review of Systems    Defer to PCP Cardiac Risk Factors include: advanced age (>36mn, >>43women);male gender;dyslipidemia     Objective:    There were no vitals filed for this visit. There is no height or weight on file to calculate BMI.     10/16/2022    9:04 AM 10/10/2021    8:23 AM 10/24/2019    8:41 AM 09/20/2019   12:44 PM 07/28/2019   11:14 AM 07/27/2019   11:35 AM 02/03/2017    7:58 AM  Advanced Directives  Does Patient Have a Medical Advance Directive? Yes Yes Yes Yes Yes Yes Yes  Type of AParamedicof ARio CommunitiesLiving will HLakesideLiving will HRinardLiving will Healthcare Power of AHallsLiving will HMathistonLiving will Living will;Healthcare Power of Attorney  Does patient want to make changes to medical advance directive?    No - Patient declined     Copy of [place] Chart? No - copy requested No - copy requested No - copy requested No - copy requested No - copy requested      Current Medications (verified) Outpatient Encounter Medications as of 10/16/2022  Medication Sig   acetaminophen (TYLENOL) 325 MG tablet Take 650 mg by mouth every 6 (six) hours as needed (for pain.).   amiodarone (PACERONE) 200 MG tablet Take 1 tablet (200 mg total) by mouth daily.   candesartan (ATACAND) 8 MG tablet TAKE 0.5 TABLETS (4 MG TOTAL) BY MOUTH 2 (TWO) TIMES DAILY.    carboxymethylcellul-glycerin (OPTIVE) 0.5-0.9 % ophthalmic solution Place 1 drop into both eyes daily.   cetirizine (ZYRTEC) 10 MG tablet Take 10 mg by mouth as needed for allergies.   Colchicine (MITIGARE) 0.6 MG CAPS Take 0.6 mg by mouth 2 (two) times daily as needed (gout flare).   dapagliflozin propanediol (FARXIGA) 10 MG TABS tablet TAKE 1 TABLET BY MOUTH EVERY DAY BEFORE BREAKFAST   ELIQUIS 5 MG TABS tablet TAKE 1 TABLET BY MOUTH TWICE A DAY   furosemide (LASIX) 40 MG tablet Take 40 mg by mouth every other day.   levothyroxine (SYNTHROID) 88 MCG tablet Take 1 tablet (88 mcg total) by mouth daily before breakfast.   mexiletine (MEXITIL) 250 MG capsule TAKE 1 CAPSULE BY MOUTH 2 TIMES DAILY.   nitroGLYCERIN (NITROSTAT) 0.4 MG SL tablet Place 1 tablet (0.4 mg total) under the tongue every 5 (five) minutes x 3 doses as needed for chest pain. (Patient not taking: Reported on 09/30/2022)   Omega-3 Fatty Acids (FISH OIL) 1200 MG CAPS Take 3,600 mg by mouth daily.   rosuvastatin (CRESTOR) 40 MG tablet TAKE 1 TABLET BY MOUTH EVERYDAY AT BEDTIME   spironolactone (ALDACTONE) 25 MG tablet TAKE 1/2 TABLET BY MOUTH EVERY DAY   No facility-administered encounter medications on file as of 10/16/2022.    Allergies (verified) Atorvastatin, Entresto [sacubitril-valsartan], Sulfonamide derivatives, Sulfamethoxazole, and Tape   History: Past Medical History:  Diagnosis Date   AICD (automatic cardioverter/defibrillator) present 6/09  [employer]. Jude   Bradycardia    Use of beta blocker limited   CAD (coronary artery disease)    a- S/p cabg in march 2006 by Dr.Owen;  b. [name] 5/12: large IL scar from apex to base, no ischemia, EF 37%   Cancer (HCC)    on right side of neck   ED (erectile dysfunction)    Gout    Hyperlipidemia    Ischemic cardiomyopathy    a-Echocardiogram, Nov 2006, showed ejection fraction of 30-40% with mild to moderated mitral  regurgitation and mild aortic regurgitation b- Cardiac  MRI, May 2007, EF of 44% with 50% scar involving  the inferolateral walls. No comment of mitral regurgitation. c- last echo  11/10: EF 30-35%  mod MR d- Nega T-Wave alternans testing in May of 2007 e- no ACE due to low BP;  f. CPX 3/11: normal   RBBB (right bundle branch block with left anterior fascicular block)    Systolic CHF, chronic (HCC)    Thyroid disease    Past Surgical History:  Procedure Laterality Date   CARDIAC DEFIBRILLATOR PLACEMENT  06/01/08   ICD   COLONOSCOPY     COLONOSCOPY WITH PROPOFOL N/A 07/28/2019   Procedure: COLONOSCOPY WITH PROPOFOL;  Surgeon: [name], MD;  Location: [facility];  Service: Endoscopy;  Laterality: N/A;   CORONARY ARTERY BYPASS GRAFT  02-2005   ICD GENERATOR CHANGEOUT N/A 02/03/2017   Procedure: ICD Generator Changeout;  Surgeon: [name], MD;  Location: [facility];  Service: Cardiovascular;  Laterality: N/A;   ORIF ANKLE FRACTURE  1987   left    RIGHT/LEFT HEART CATH AND CORONARY/GRAFT ANGIOGRAPHY N/A 09/20/2019   Procedure: RIGHT/LEFT HEART CATH AND CORONARY/GRAFT ANGIOGRAPHY;  Surgeon: [name], MD;  Location: [facility];  Service: Cardiovascular;  Laterality: N/A;   SHOULDER ARTHROSCOPY  02/2010   rt    TEE WITHOUT CARDIOVERSION N/A 10/24/2019   Procedure: TRANSESOPHAGEAL ECHOCARDIOGRAM (TEE);  Surgeon: [name], MD;  Location: [facility];  Service: Cardiovascular;  Laterality: N/A;   TONSILLECTOMY     as a child   Family History  Problem Relation Age of Onset   Heart attack Father 72   Cardiomyopathy Brother    Colon cancer Neg Hx    Prostate cancer Neg Hx    Diabetes Neg Hx    Social History   Socioeconomic History   Marital status: Married    Spouse name: Not on file   Number of children: 2   Years of education: Not on file   Highest education level: Not on file  Occupational History   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      Employer: [employer]  Tobacco Use   Smoking status: Former    Packs/day: 0.10    Types: Cigarettes    Quit date: 12/16/1999    Years since quitting: 22.8   Smokeless tobacco: Never  Vaping Use   Vaping Use: Never used  Substance and Sexual Activity   Alcohol use: Not Currently   Drug use: No   Sexual activity: Not on file  Other Topics Concern   Not on file  Social History Narrative   Retired    household- pt and wife   2 adult children in [place] also has grandchildren   Former but not current smoker, occasional alcohol no drug use   Social Determinants of Health   Financial Resource Strain: Medium Risk (01/14/2022)  Overall Financial Resource Strain (CARDIA)    Difficulty of Paying Living Expenses: Somewhat hard  Food Insecurity: No Food Insecurity (10/10/2021)   Hunger Vital Sign    Worried About Running Out of Food in the Last Year: Never true    Ran Out of Food in the Last Year: Never true  Transportation Needs: No Transportation Needs (10/10/2021)   PRAPARE - Hydrologist (Medical): No    Lack of Transportation (Non-Medical): No  Physical Activity: Sufficiently Active (05/16/2022)   Exercise Vital Sign    Days of Exercise per Week: 5 days    Minutes of Exercise per Session: 60 min  Stress: No Stress Concern Present (10/10/2021)   [place]    Feeling of Stress : Not at all  Social Connections: Moderately Integrated (10/10/2021)   Social Connection and Isolation Panel [NHANES]    Frequency of Communication with Friends and Family: More than three times a week    Frequency of Social Gatherings with Friends and Family: More than three times a week    Attends Religious Services: More than 4 times per year    Active Member of [employer] or Organizations: No    Attends Archivist Meetings: Never    Marital Status: Married    Tobacco Counseling Counseling given: Not  Answered   Clinical Intake:  Pre-visit preparation completed: Yes  Pain : No/denies pain  Diabetes: No  How often do you need to have someone help you when you read instructions, pamphlets, or other written materials from your doctor or pharmacy?: 1 - Never   Activities of Daily Living    10/16/2022    9:06 AM  In your present state of health, do you have any difficulty performing the following activities:  Hearing? 1  Comment tinnitus  Vision? 1  Comment has floaters  Difficulty concentrating or making decisions? 0  Walking or climbing stairs? 0  Dressing or bathing? 0  Doing errands, shopping? 0  Preparing Food and eating ? N  Using the Toilet? N  In the past six months, have you accidently leaked urine? N  Do you have problems with loss of bowel control? N  Managing your Medications? N  Managing your Finances? N  Housekeeping or managing your Housekeeping? N    Patient Care Team: Colon Branch, MD as PCP - General Lovena Le Champ Mungo, MD as PCP - Electrophysiology (Cardiology) Bensimhon, Shaune Pascal, MD as Consulting Physician (Cardiology) Memory Argue, MD as Referring Physician [name], MD as Consulting Physician (Cardiology) Cherre Robins, [place] (Pharmacist)  Indicate any recent Medical Services you may have received from other than [facility] providers in the past year (date may be approximate).     Assessment:   This is a routine wellness examination for Nathanial.  Hearing/Vision screen No results found.  Dietary issues and exercise activities discussed: Current Exercise Habits: Home exercise routine, Type of exercise: treadmill;strength training/weights;Other - see comments (plays golf at least 3 days a week), Time (Minutes): 60, Frequency (Times/Week): 5, Weekly Exercise (Minutes/Week): 300, Intensity: Moderate, Exercise limited by: None identified   Goals Addressed   None    Depression Screen    10/16/2022    9:05 AM 09/30/2022    8:33 AM 06/19/2022     8:05 AM 10/10/2021    8:26 AM 06/18/2021    7:49 AM 06/14/2020    7:56 AM 06/03/2018   10:22 AM  PHQ 2/9 Scores  PHQ - 2 Score 0 1 1 0 1 0 0    Fall Risk    10/16/2022    9:05 AM 09/30/2022    8:33 AM 06/19/2022    8:05 AM 10/10/2021    8:25 AM 06/18/2021    7:49 AM  Fall Risk   Falls in the past year? 0 0 0 0 0  Number falls in past yr: 0 0 0 0 0  Injury with Fall? 0 0 0 0 0  Risk for fall due to : No Fall Risks      Follow up Falls evaluation completed Falls evaluation completed Falls evaluation completed Falls prevention discussed Falls evaluation completed    FALL RISK PREVENTION PERTAINING TO THE HOME:  Any stairs in or around the home? Yes  If so, are there any without handrails? No  Home free of loose throw rugs in walkways, pet beds, electrical cords, etc? Yes  Adequate lighting in your home to reduce risk of falls? Yes   ASSISTIVE DEVICES UTILIZED TO PREVENT FALLS:  Life alert? No  Use of a cane, walker or w/c? No  Grab bars in the bathroom? No  Shower chair or bench in shower? Yes  Elevated toilet seat or a handicapped toilet? No   TIMED UP AND GO:  Was the test performed?  No, audio visit .   Cognitive Function:        10/16/2022    9:11 AM  6CIT Screen  What Year? 0 points  What month? 0 points  What time? 0 points  Count back from 20 0 points  Months in reverse 0 points  Repeat phrase 0 points  Total Score 0 points    Immunizations Immunization History  Administered Date(s) Administered   Fluad Quad(high Dose 65+) 10/25/2020   Influenza Split 10/10/2011, 10/11/2012   Influenza Whole 11/18/2007, 09/27/2008, 09/27/2009, 08/20/2010   Influenza, High Dose Seasonal PF 11/25/2016, 10/22/2017, 10/21/2018, 10/19/2019, 10/14/2021   Influenza,inj,Quad PF,6+ Mos 10/13/2013, 10/24/2014, 11/20/2015   Influenza-Unspecified 10/10/2022   PFIZER(Purple Top)SARS-COV-2 Vaccination 02/03/2020, 02/22/2020, 09/18/2020   Pneumococcal Conjugate-13 04/13/2014    Pneumococcal Polysaccharide-23 03/07/2009, 11/20/2015   Td 12/15/2006, 04/27/2017   Zoster Recombinat (Shingrix) 04/30/2018, 07/02/2018   Zoster, Live 04/11/2013    TDAP status: Up to date  Flu Vaccine status: Up to date  Pneumococcal vaccine status: Up to date  Covid-19 vaccine status: Information provided on how to obtain vaccines.   Qualifies for Shingles Vaccine? Yes   Zostavax completed Yes   Shingrix Completed?: Yes  Screening Tests Health Maintenance  Topic Date Due   COVID-19 Vaccine (4 - Pfizer risk series) 11/13/2020   Medicare Annual Wellness (AWV)  10/10/2022   TETANUS/TDAP  04/28/2027   COLONOSCOPY (Pts 45-61yr Insurance coverage will need to be confirmed)  07/27/2029   Pneumonia Vaccine 72 Years old  Completed   INFLUENZA VACCINE  Completed   Hepatitis C Screening  Completed   Zoster Vaccines- Shingrix  Completed   HPV VACCINES  Aged Out    Health Maintenance  Health Maintenance Due  Topic Date Due   COVID-19 Vaccine (4 - Pfizer risk series) 11/13/2020   Medicare Annual Wellness (AWV)  10/10/2022    Colorectal cancer screening: Type of screening: Colonoscopy. Completed 07/28/19. Repeat every 10 years  Lung Cancer Screening: (Low Dose CT Chest recommended if Age 72-80years, 30 pack-year currently smoking OR have quit w/in 15years.) does not qualify.   Lung Cancer Screening Referral: N/a  Additional Screening:  Hepatitis C Screening:  does qualify; Completed 04/27/17  Vision Screening: Recommended annual ophthalmology exams for early detection of glaucoma and other disorders of the eye. Is the patient up to date with their annual eye exam?   has appointment next week Who is the provider or what is the name of the office in which the patient attends annual eye exams? Dr. Edger House and [place] If pt is not established with a provider, would they like to be referred to a provider to establish care? No .   Dental Screening: Recommended annual  dental exams for proper oral hygiene  Community Resource Referral / Chronic Care Management: CRR required this visit?  No   CCM required this visit?  No      Plan:     I have personally reviewed and noted the following in the patient's chart:   Medical and social history Use of alcohol, tobacco or illicit drugs  Current medications and supplements including opioid prescriptions. Patient is not currently taking opioid prescriptions. Functional ability and status Nutritional status Physical activity Advanced directives List of other physicians Hospitalizations, surgeries, and ER visits in previous 12 months Vitals Screenings to include cognitive, depression, and falls Referrals and appointments  In addition, I have reviewed and discussed with patient certain preventive protocols, quality metrics, and best practice recommendations. A written personalized care plan for preventive services as well as general preventive health recommendations were provided to patient.   Due to this being a telephonic visit, the after visit summary with patients personalized plan was offered to patient via mail or my-chart. Patient would like to access on my-chart.  Beatris Ship, [place]   10/16/2022   Nurse Notes: None  I have reviewed and agree with [facility] documentation.  Kathlene November, MD

## 2022-10-20 ENCOUNTER — Other Ambulatory Visit: Payer: Self-pay | Admitting: Internal Medicine

## 2022-10-20 NOTE — Telephone Encounter (Signed)
Prescription refill request for Eliquis received. Indication:a fib Last office visit:8/23 Scr:1.3 Age: 71 Weight:66 kg  Prescription refilled

## 2022-10-28 ENCOUNTER — Ambulatory Visit (INDEPENDENT_AMBULATORY_CARE_PROVIDER_SITE_OTHER): Payer: Medicare HMO

## 2022-10-28 DIAGNOSIS — I472 Ventricular tachycardia, unspecified: Secondary | ICD-10-CM

## 2022-10-28 LAB — CUP PACEART REMOTE DEVICE CHECK
Battery Remaining Longevity: 46 mo
Battery Remaining Percentage: 52 %
Battery Voltage: 2.95 V
Brady Statistic RV Percent Paced: 1.4 %
Date Time Interrogation Session: 20231114040016
HighPow Impedance: 48 Ohm
HighPow Impedance: 48 Ohm
Implantable Lead Connection Status: 753985
Implantable Lead Implant Date: 20090618
Implantable Lead Location: 753860
Implantable Lead Model: 7121
Implantable Pulse Generator Implant Date: 20180220
Lead Channel Impedance Value: 390 Ohm
Lead Channel Pacing Threshold Amplitude: 1.25 V
Lead Channel Pacing Threshold Pulse Width: 1 ms
Lead Channel Sensing Intrinsic Amplitude: 3.2 mV
Lead Channel Setting Pacing Amplitude: 4 V
Lead Channel Setting Pacing Pulse Width: 1 ms
Lead Channel Setting Sensing Sensitivity: 0.5 mV
Pulse Gen Serial Number: 7406967

## 2022-10-30 ENCOUNTER — Ambulatory Visit (INDEPENDENT_AMBULATORY_CARE_PROVIDER_SITE_OTHER): Payer: Medicare HMO | Admitting: Pharmacist

## 2022-10-30 DIAGNOSIS — I480 Paroxysmal atrial fibrillation: Secondary | ICD-10-CM

## 2022-10-30 DIAGNOSIS — E785 Hyperlipidemia, unspecified: Secondary | ICD-10-CM

## 2022-10-30 DIAGNOSIS — I5022 Chronic systolic (congestive) heart failure: Secondary | ICD-10-CM

## 2022-10-30 NOTE — Progress Notes (Signed)
Pharmacy Note  10/30/2022 Name: Matthew HERNAN Sr. MRN: [id number] DOB: 05-24-1951  Subjective: Matthew Romans Sr. is a 71 y.o. year old male who is a primary care patient of Colon Branch, MD. Clinical Pharmacist Practitioner referral was placed to assist with medication management.    Engaged with patient by telephone for follow up visit today.  Assisted in 2023 with getting Farxiga from medication assistance program. Patient reports he received a letter that he was approved to continue to received [place] thru 12/15/2023 now.  Patient is in Medicare coverage gap - Eliquis is $150 per month. He has spent > 3% of income out of pocket on medications in 2023 and we can now apply for medication assistance program for Eliquis.    Objective: Review of patient status, including review of consultants reports, laboratory and other test data, was performed as part of comprehensive evaluation and provision of chronic care management services.   Lab Results  Component Value Date   CREATININE 1.30 (H) 07/24/2022   CREATININE 1.23 06/19/2022   CREATININE 1.19 04/21/2022    Lab Results  Component Value Date   HGBA1C 5.9 06/19/2022       Component Value Date/Time   CHOL 123 06/19/2022 0832   TRIG 52.0 06/19/2022 0832   TRIG 45 12/16/2006 0912   HDL 59.10 06/19/2022 0832   CHOLHDL 2 06/19/2022 0832   VLDL 10.4 06/19/2022 0832   LDLCALC 54 06/19/2022 0832     Clinical ASCVD: Yes  The ASCVD Risk score (Arnett DK, et al., 2019) failed to calculate for the following reasons:   The valid total cholesterol range is 130 to 320 mg/dL    BP Readings from Last 3 Encounters:  09/30/22 106/68  07/24/22 (!) 90/50  06/26/22 104/66     Allergies  Allergen Reactions   Atorvastatin      myalgia, Muscle aching   Entresto [Sacubitril-Valsartan] Itching    Had itching and redness   Sulfonamide Derivatives     Rash and hot flashes   Sulfamethoxazole Rash and Other (See Comments)    Hot  flashes   Tape Other (See Comments)    Rips skin- use PAPER tape    Medications Reviewed Today     Reviewed by Cherre Robins, RPH-CPP (Pharmacist) on 10/30/22 at [place] List Status: <None>   Medication Order Taking? Sig Documenting Provider Last Dose Status Informant  acetaminophen (TYLENOL) 325 MG tablet [id number] Yes Take 650 mg by mouth every 6 (six) hours as needed (for pain.). [provider] Taking Active Self           Med Note ([place] Oct 12, 2019  2:03 PM)    amiodarone (PACERONE) 200 MG tablet [id number] Yes Take 1 tablet (200 mg total) by mouth daily. [name], MD Taking Active   candesartan (ATACAND) 8 MG tablet [id number] Yes TAKE 0.5 TABLETS (4 MG TOTAL) BY MOUTH 2 (TWO) TIMES DAILY. Bensimhon, Shaune Pascal, MD Taking Active   carboxymethylcellul-glycerin (OPTIVE) 0.5-0.9 % ophthalmic solution [id number] Yes Place 1 drop into both eyes daily. [provider] Taking Active Self  cetirizine (ZYRTEC) 10 MG tablet [id number] Yes Take 10 mg by mouth as needed for allergies. [provider] Taking Active Self  Colchicine (MITIGARE) 0.6 MG CAPS [id number] Yes Take 0.6 mg by mouth 2 (two) times daily as needed (gout flare). Colon Branch, MD Taking Active  Med Note Rose Phi   Tue Jul 02, 2021  2:32 PM)    dapagliflozin propanediol (FARXIGA) 10 MG TABS tablet [id number] Yes TAKE 1 TABLET BY MOUTH EVERY DAY BEFORE BREAKFAST Colon Branch, MD Taking Active            Med Note Madaline Brilliant Apr 21, 2022  8:08 AM)    Arne [place] 5 MG TABS tablet [id number] Yes TAKE 1 TABLET BY MOUTH TWICE A DAY [name], MD Taking Active   furosemide (LASIX) 40 MG tablet [id number] Yes Take 40 mg by mouth every other day. [provider] Taking Active   levothyroxine (SYNTHROID) 88 MCG tablet [id number] Yes Take 1 tablet (88 mcg total) by mouth daily before breakfast. Colon Branch, MD Taking Active   mexiletine (MEXITIL) 250 MG  capsule [id number] Yes TAKE 1 CAPSULE BY MOUTH 2 TIMES DAILY. Shirley Friar, PA-C Taking Active   nitroGLYCERIN (NITROSTAT) 0.4 MG SL tablet [id number] Yes Place 1 tablet (0.4 mg total) under the tongue every 5 (five) minutes x 3 doses as needed for chest pain. Colon Branch, MD Taking Active            Med Note [facility], Vilma Prader D   Tue Sep 30, 2022  8:17 AM) PRN  Omega-3 Fatty Acids (FISH OIL) 1200 MG CAPS [id number] Yes Take 3,600 mg by mouth daily. [provider] Taking Active Self  rosuvastatin (CRESTOR) 40 MG tablet [id number] Yes TAKE 1 TABLET BY MOUTH EVERYDAY AT BEDTIME Dutch Quint B, FNP Taking Active   spironolactone (ALDACTONE) 25 MG tablet [id number] Yes TAKE 1/2 TABLET BY MOUTH EVERY DAY Bensimhon, Shaune Pascal, MD Taking Active             Patient Active Problem List   Diagnosis Date Noted   Presence of implantable cardioverter-defibrillator (ICD) 06/26/2022   VT (ventricular tachycardia) ([place]) 07/02/2021   Paroxysmal atrial fibrillation ([place]) 07/02/2021   Heme + stool    Internal and external prolapsed hemorrhoids    Difficulty swallowing 01/21/2018   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 ([place]) 12/24/2017   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 ([place]) 11/12/2017   PCP NOTES >>>>>>>>>>>>>>>>>>>>> 04/21/2016   Seborrheic dermatitis of scalp 04/11/2013   Annual physical exam 04/11/2011   MITRAL REGURGITATION 57/32/2025   SYSTOLIC HEART FAILURE, CHRONIC 10/03/2009   Hyperglycemia 06/19/2008   Coronary atherosclerosis 06/14/2008   Automatic implantable cardioverter-defibrillator in situ 06/01/2008   ERECTILE DYSFUNCTION 12/17/2007   Hyperlipidemia 02/06/2007   Gout 02/06/2007   RBBB 02/06/2007   Congestive heart failure ([place]) 02/06/2007   CORONARY ARTERY BYPASS GRAFT, FOUR VESSEL, HX OF 02/12/2005     Medication Assistance:   Wilder Glade obtained through [facility] and Me Program   medication assistance program.  Enrollment ends 12/15/2023 - patient just received letter that he was re-enrolled for 2024.  Verified patient has spend 3% of income out of pocket for meds in [id number] application for Eliquis medication assistance program.   Assessment / Plan: Hyperlipidemia - LDL goal < 55 - at goal  Continue rosuvastatin daily - reminded refill is due soon CHF - controlled Continue Farxiga '10mg'$  daily, spironolactone '25mg'$  daily and candesartan '8mg'$  daily Afib On anticoagulation - applying for Eliqius medication assistance program. Mailed patient application and reminded him to pick up pharmacy report from [facility].   Follow Up:  Telephone follow up appointment with care management team member  scheduled for:  6 months   Cherre Robins, PharmD Clinical Pharmacist [place] [phone]

## 2022-10-31 ENCOUNTER — Telehealth: Payer: Self-pay

## 2022-10-31 NOTE — Telephone Encounter (Signed)
Mailed AZ&ME renewal application to patient home.   Please have office staff fax to [phone] ATTENTION Mahmud Keithly. Patient will be returning to office.   Costas Sena L. CPhT Rx Patient Advocate 

## 2022-11-04 DIAGNOSIS — H524 Presbyopia: Secondary | ICD-10-CM | POA: Diagnosis not present

## 2022-11-04 DIAGNOSIS — Z0100 Encounter for examination of eyes and vision without abnormal findings: Secondary | ICD-10-CM | POA: Diagnosis not present

## 2022-11-08 ENCOUNTER — Other Ambulatory Visit: Payer: Self-pay | Admitting: Student

## 2022-11-24 ENCOUNTER — Telehealth: Payer: Self-pay | Admitting: Pharmacist

## 2022-11-24 NOTE — Telephone Encounter (Signed)
Patient states he has report from pharmacy and has completed paperwork for Eliquis medication assistance program. He will bring into office Wed 11/26/2022.  He also states he received a letter from [facility] and Me notifying him that he was approved to received [place] thru 12/07/2022. He will bring in that letter so that we can scan to his chart.

## 2022-11-25 ENCOUNTER — Encounter (HOSPITAL_COMMUNITY): Payer: Self-pay | Admitting: Internal Medicine

## 2022-11-25 ENCOUNTER — Ambulatory Visit (HOSPITAL_COMMUNITY)
Admission: RE | Admit: 2022-11-25 | Discharge: 2022-11-25 | Disposition: A | Discharge status: Home / Self Care | Payer: Medicare HMO | Source: Ambulatory Visit | Attending: Internal Medicine | Admitting: Internal Medicine

## 2022-11-25 VITALS — BP 100/58 | HR 47 | Wt 149.4 lb

## 2022-11-25 DIAGNOSIS — Z95810 Presence of automatic (implantable) cardiac defibrillator: Secondary | ICD-10-CM | POA: Insufficient documentation

## 2022-11-25 DIAGNOSIS — Z923 Personal history of irradiation: Secondary | ICD-10-CM | POA: Insufficient documentation

## 2022-11-25 DIAGNOSIS — I472 Ventricular tachycardia, unspecified: Secondary | ICD-10-CM

## 2022-11-25 DIAGNOSIS — E119 Type 2 diabetes mellitus without complications: Secondary | ICD-10-CM | POA: Insufficient documentation

## 2022-11-25 DIAGNOSIS — Z6740 Type O blood, Rh positive: Secondary | ICD-10-CM | POA: Diagnosis not present

## 2022-11-25 DIAGNOSIS — I2510 Atherosclerotic heart disease of native coronary artery without angina pectoris: Secondary | ICD-10-CM | POA: Diagnosis not present

## 2022-11-25 DIAGNOSIS — I340 Nonrheumatic mitral (valve) insufficiency: Secondary | ICD-10-CM | POA: Diagnosis not present

## 2022-11-25 DIAGNOSIS — Z951 Presence of aortocoronary bypass graft: Secondary | ICD-10-CM | POA: Diagnosis not present

## 2022-11-25 DIAGNOSIS — I5022 Chronic systolic (congestive) heart failure: Secondary | ICD-10-CM | POA: Diagnosis not present

## 2022-11-25 DIAGNOSIS — Z7901 Long term (current) use of anticoagulants: Secondary | ICD-10-CM | POA: Insufficient documentation

## 2022-11-25 DIAGNOSIS — I4510 Unspecified right bundle-branch block: Secondary | ICD-10-CM | POA: Diagnosis not present

## 2022-11-25 DIAGNOSIS — I083 Combined rheumatic disorders of mitral, aortic and tricuspid valves: Secondary | ICD-10-CM | POA: Diagnosis not present

## 2022-11-25 DIAGNOSIS — Z79899 Other long term (current) drug therapy: Secondary | ICD-10-CM | POA: Diagnosis not present

## 2022-11-25 DIAGNOSIS — C140 Malignant neoplasm of pharynx, unspecified: Secondary | ICD-10-CM | POA: Insufficient documentation

## 2022-11-25 DIAGNOSIS — Z9221 Personal history of antineoplastic chemotherapy: Secondary | ICD-10-CM | POA: Insufficient documentation

## 2022-11-25 DIAGNOSIS — E785 Hyperlipidemia, unspecified: Secondary | ICD-10-CM | POA: Diagnosis not present

## 2022-11-25 DIAGNOSIS — I480 Paroxysmal atrial fibrillation: Secondary | ICD-10-CM | POA: Insufficient documentation

## 2022-11-25 LAB — BRAIN NATRIURETIC PEPTIDE: B Natriuretic Peptide: 194.3 pg/mL — ABNORMAL HIGH (ref 0.0–100.0)

## 2022-11-25 LAB — COMPREHENSIVE METABOLIC PANEL
ALT: 23 U/L (ref 0–44)
AST: 23 U/L (ref 15–41)
Albumin: 3.6 g/dL (ref 3.5–5.0)
Alkaline Phosphatase: 55 U/L (ref 38–126)
Anion gap: 10 (ref 5–15)
BUN: 23 mg/dL (ref 8–23)
CO2: 23 mmol/L (ref 22–32)
Calcium: 9.5 mg/dL (ref 8.9–10.3)
Chloride: 105 mmol/L (ref 98–111)
Creatinine, Ser: 1.03 mg/dL (ref 0.61–1.24)
GFR, Estimated: >60 mL/min (ref >60)
Glucose, Bld: 86 mg/dL (ref 70–99)
Potassium: 4.4 mmol/L (ref 3.5–5.1)
Sodium: 138 mmol/L (ref 135–145)
Total Bilirubin: 1 mg/dL (ref 0.3–1.2)
Total Protein: 6.2 g/dL — ABNORMAL LOW (ref 6.5–8.1)

## 2022-11-25 LAB — T4, FREE: Free T4: 1.47 ng/dL — ABNORMAL HIGH (ref 0.61–1.12)

## 2022-11-25 LAB — TSH: TSH: 1.724 u[IU]/mL (ref 0.350–4.500)

## 2022-11-25 MED ORDER — AMIODARONE HCL 200 MG PO TABS: 100.0000 mg | ORAL_TABLET | Freq: Every day | ORAL | 3 refills | Status: DC

## 2022-11-25 NOTE — Patient Instructions (Signed)
Medication Changes:  Decrease Amiodarone to 100 mg (1/2 tab) Daily  Lab Work:  Labs done today, your results will be available in MyChart, we will contact you for abnormal readings.   Testing/Procedures:  Your physician has requested that you have an echocardiogram. Echocardiography is a painless test that uses sound waves to create images of your heart. It provides your doctor with information about the size and shape of your heart and how well your heart's chambers and valves are working. This procedure takes approximately one hour. There are no restrictions for this procedure. Please do NOT wear cologne, perfume, aftershave, or lotions (deodorant is allowed). Please arrive 15 minutes prior to your appointment time. IN 6 MONTHS WITH FOLLOW-UP  Referrals:  none  Special Instructions // Education:  Do the following things EVERYDAY: Weigh yourself in the morning before breakfast. Write it down and keep it in a log. Take your medicines as prescribed Eat low salt foods--Limit salt (sodium) to 2000 mg per day.  Stay as active as you can everyday Limit all fluids for the day to less than 2 liters   Follow-Up in: 6 months with echocardiogram (June 2024), **PLEASE CALL OUR OFFICE IN [place] TO SCHEDULE THESE APPOINTMENT  At the [facility], you and your health needs are our priority. We have a designated team specialized in the treatment of Heart Failure. This Care Team includes your primary Heart Failure Specialized Cardiologist (physician), Advanced Practice Providers (APPs- Physician Assistants and Nurse Practitioners), and Pharmacist who all work together to provide you with the care you need, when you need it.   You may see any of the following providers on your designated Care Team at your next follow up:  Dr. Glori Bickers Dr. Loralie Champagne Dr. Roxana Hires, NP Lyda Jester, [place] [facility] [place], [place] Forestine Na, NP Audry Riles, PharmD   Please be sure to bring in all your medications bottles to every appointment.   Need to Contact us:  If you have any questions or concerns before your next appointment please send [place] a message through [place] or call our office at [phone].    TO LEAVE A MESSAGE FOR THE NURSE SELECT OPTION 2, PLEASE LEAVE A MESSAGE INCLUDING: YOUR NAME DATE OF BIRTH CALL BACK NUMBER REASON FOR CALL**this is important as we prioritize the call backs  YOU WILL RECEIVE A CALL BACK THE SAME DAY AS LONG AS YOU CALL BEFORE 4:00 PM

## 2022-11-25 NOTE — Progress Notes (Signed)
[facility] Note  Date:  11/25/2022   ID:  Matthew Romans Sr., DOB 22-Jan-1951, MRN [id number]  Location: Home  Provider location: [facility] Type of Visit: Established patient  PCP:  Matthew Branch, MD  Cardiologist:  None Primary HF: Matthew Joyce  Chief Complaint: Heart Failure follow-up   History of Present Illness:  Matthew Joyce is a 71 y.o. male with a history of a CAD S/P CABG in 2006 (LIMA -> LAD, SVG -> Ramus, SVG -> OM-2, SVG -> RCA), s/p ICD, ischemic MR, DM, hyperlipidemia, and throat cancer s/p chemo/XRT in 2018   Stress test 08/14/2016 with EF 31%, old infarct, no ischemia.    Developed CP and SOB in early October. Cath on 09/20/19. Coronary anatomy was stable but EF down to 20% on cath with 3+ MR Echo on 09/20/19 read as EF 25-30% with mod MR. (I reviewed echo and EF 20-25% and moderate to severe MR). Since that time has had CPX with only mild HF limitation.  TEE EF 20-25%. 3+MR and severe TR.    On 01/29/20 woke from sleep for unknown reasons. Found to have shock for VT/VF. Saw [name]   On 07/22/20 had multiple runs of NSVT, SVT and 1 run of VT (20 seconds max rate 220). Felt to have gotten inappropriate ATP for AF.    Seen in 9/21 for ICD firing due to AF with RVR. Started on amio and b-blocker stopped due to bradycardia.    Echo 05/29/20: EF 20% mod to severe RV dysfunction severe central MR Personally reviewed  Echo 5/23 EF 25-30% severe MR RV moderately reduced  Here for HF f/u. Says he feels pretty good. Walking 3 days/week on TM 3.73mh at 6% grade for 3 miles. No CP or undue SOB. Playing golf. No edema, orthopnea or PND. Weight stable. No further weight loss. BP stable ~ 100. Good appetite.  Cardiac studies:   Cath 09/20/19   1. 3v CAD  2. Patent LIMA to LAD 3. Patent free RIMA to ramus 4. Occluded SVG to RCA with widely patent native RCA 5. Unable to locate SVG to OM2 mentioned in OP report. There is no graft marker  for this graft and Ao root shot did not show any SVG to OM (suspect may not have been placed) 6. EF 20% with global HK and 3+ MR   PCW = 22 (v = 35-40) Fick cardiac output/index = 4.5/2.4  CPX 06/2020  FVC 3.54 (85%)      FEV1 2.81 (89%)        FEV1/FVC 79 (104%)        MVV 104 (82%)      Peak VO2: 27.5 (99% predicted peak VO2)  VE/VCO2 slope:  30  OUES: 1.99  Peak RER: 0.95    CPX test 10/20/19  BP rest: 92/56 Standing BP: 86/52 BP peak: 116/62  Peak VO2: 24.5 (88% predicted peak VO2)  VE/VCO2 slope:  37  OUES: 2.11  Peak RER: 1.01     TEE 10/24/19  1. Left ventricular ejection fraction 20-25%  2. Global right ventricle has moderately reduced systolic function.  3. Left atrial size was severely dilated.  4. No LAA clot.  5. Right atrial size was mild-moderately dilated.  6. The mitral valve is abnormal. Moderate mitral valve regurgitation.  7. Mildly restricted posterior MV leaflet with 3+ MR.  8. The tricuspid valve is normal in structure. Tricuspid valve regurgitation is severe.  Past Medical History:  Diagnosis Date   AICD (automatic cardioverter/defibrillator) present 6/09   St. Jude   Bradycardia    Use of beta blocker limited   CAD (coronary artery disease)    a- S/p cabg in march 2006 by Matthew Joyce;  b. Matthew Joyce 5/12: large IL scar from apex to base, no ischemia, EF 37%   Cancer (HCC)    on right side of neck   ED (erectile dysfunction)    Gout    Hyperlipidemia    Ischemic cardiomyopathy    a-Echocardiogram, Nov 2006, showed ejection fraction of 30-40% with mild to moderated mitral  regurgitation and mild aortic regurgitation b- Cardiac MRI, May 2007, EF of 44% with 50% scar involving  the inferolateral walls. No comment of mitral regurgitation. c- last echo  11/10: EF 30-35%  mod MR d- Nega T-Wave alternans testing in May of 2007 e- no ACE due to low BP;  f. CPX 3/11: normal   RBBB (right bundle Joyce block with left anterior fascicular block)    Systolic  CHF, chronic (HCC)    Thyroid disease    Past Surgical History:  Procedure Laterality Date   CARDIAC DEFIBRILLATOR PLACEMENT  06/01/08   ICD   COLONOSCOPY     COLONOSCOPY WITH PROPOFOL N/A 07/28/2019   Procedure: COLONOSCOPY WITH PROPOFOL;  Surgeon: Matthew Mayer, MD;  Location: [facility];  Service: Endoscopy;  Laterality: N/A;   CORONARY ARTERY BYPASS GRAFT  02-2005   ICD GENERATOR CHANGEOUT N/A 02/03/2017   Procedure: ICD Generator Changeout;  Surgeon: Matthew Lance, MD;  Location: [place] [facility];  Service: Cardiovascular;  Laterality: N/A;   ORIF ANKLE FRACTURE  1987   left    RIGHT/LEFT HEART CATH AND CORONARY/GRAFT ANGIOGRAPHY N/A 09/20/2019   Procedure: RIGHT/LEFT HEART CATH AND CORONARY/GRAFT ANGIOGRAPHY;  Surgeon: Matthew Artist, MD;  Location: [place] [facility];  Service: Cardiovascular;  Laterality: N/A;   SHOULDER ARTHROSCOPY  02/2010   rt    TEE WITHOUT CARDIOVERSION N/A 10/24/2019   Procedure: TRANSESOPHAGEAL ECHOCARDIOGRAM (TEE);  Surgeon: Matthew Artist, MD;  Location: [facility];  Service: Cardiovascular;  Laterality: N/A;   TONSILLECTOMY     as a child     Current Outpatient Medications  Medication Sig Dispense Refill   acetaminophen (TYLENOL) 325 MG tablet Take 650 mg by mouth every 6 (six) hours as needed (for pain.).     amiodarone (PACERONE) 200 MG tablet Take 1 tablet (200 mg total) by mouth daily. 90 tablet 3   candesartan (ATACAND) 8 MG tablet TAKE 0.5 TABLETS (4 MG TOTAL) BY MOUTH 2 (TWO) TIMES DAILY. 90 tablet 3   carboxymethylcellul-glycerin (OPTIVE) 0.5-0.9 % ophthalmic solution Place 1 drop into both eyes daily.     cetirizine (ZYRTEC) 10 MG tablet Take 10 mg by mouth as needed for allergies.     Colchicine (MITIGARE) 0.6 MG CAPS Take 0.6 mg by mouth 2 (two) times daily as needed (gout flare). 60 capsule 0   dapagliflozin propanediol (FARXIGA) 10 MG TABS tablet TAKE 1 TABLET BY MOUTH EVERY DAY BEFORE BREAKFAST 90 tablet 3   ELIQUIS 5 MG  TABS tablet TAKE 1 TABLET BY MOUTH TWICE A DAY 60 tablet 6   furosemide (LASIX) 40 MG tablet Take 40 mg by mouth every other day.     levothyroxine (SYNTHROID) 88 MCG tablet Take 1 tablet (88 mcg total) by mouth daily before breakfast. 90 tablet 1   mexiletine (MEXITIL) 250 MG capsule TAKE 1 CAPSULE  BY MOUTH TWICE A DAY 180 capsule 1   nitroGLYCERIN (NITROSTAT) 0.4 MG SL tablet Place 1 tablet (0.4 mg total) under the tongue every 5 (five) minutes x 3 doses as needed for chest pain. 25 tablet 3   Omega-3 Fatty Acids (FISH OIL) 1200 MG CAPS Take 3,600 mg by mouth daily.     rosuvastatin (CRESTOR) 40 MG tablet TAKE 1 TABLET BY MOUTH EVERYDAY AT BEDTIME 90 tablet 2   spironolactone (ALDACTONE) 25 MG tablet TAKE 1/2 TABLET BY MOUTH EVERY DAY 45 tablet 2   No current facility-administered medications for this encounter.    Allergies:   Atorvastatin, Entresto [sacubitril-valsartan], Sulfonamide derivatives, Sulfamethoxazole, and Tape   Social History:  The patient  reports that he quit smoking about 22 years ago. His smoking use included cigarettes. He smoked an average of .1 packs per day. He has never used smokeless tobacco. He reports that he does not currently use alcohol. He reports that he does not use drugs.   Family History:  The patient's family history includes Cardiomyopathy in his brother; Heart attack (age of onset: 63) in his father.   ROS:  Please see the history of present illness.   All other systems are personally reviewed and negative.   Vitals:   11/25/22 0921  BP: (!) 100/58  Pulse: (!) 47  SpO2: 100%  Weight: 67.8 kg (149 lb 6.4 oz)   Wt Readings from Last 3 Encounters:  11/25/22 67.8 kg (149 lb 6.4 oz)  09/30/22 66 kg (145 lb 8 oz)  07/24/22 65.1 kg (143 lb 9.6 oz)    Exam:   General:  Thin. No resp difficulty HEENT: normal Neck: supple. no JVD. Carotids 2+ bilat; no bruits. No lymphadenopathy or thryomegaly appreciated. Cor: PMI nondisplaced. Regular rate &  rhythm. No rubs, gallops or murmurs. Lungs: clear Abdomen: soft, nontender, nondistended. No hepatosplenomegaly. No bruits or masses. Good bowel sounds. Extremities: no cyanosis, clubbing, rash, edema Neuro: alert & orientedx3, cranial nerves grossly intact. moves all 4 extremities w/o difficulty. Affect pleasant  ECG: SB 49 RBBB Personally reviewed  Recent Labs: 03/25/2022: B Natriuretic Peptide 614.3 04/21/2022: Magnesium 2.3 07/24/2022: ALT 32; BUN 21; Creatinine, Ser 1.30; Hemoglobin 13.3; Platelets 187; Potassium 3.8; Sodium 137 09/30/2022: TSH 1.64  Personally reviewed   Wt Readings from Last 3 Encounters:  11/25/22 67.8 kg (149 lb 6.4 oz)  09/30/22 66 kg (145 lb 8 oz)  07/24/22 65.1 kg (143 lb 9.6 oz)      ASSESSMENT AND PLAN:  1. Chronic Systolic Heart Failure ICM ECHO 08/2013 EF 45-50%  Has St Jude ICD 2009 -  Echo 07/2016 LVEF 30-35%, Mod MR, Mod LAE, Mildly dilated RV with mild reduced RV function, PA peak pressure 51 mm Hg.  - TEE 11/20 Left ventricular ejection fraction 20-25% Moderately reduced RV function 3+ MR severe TR - Echo 6/21 LV is markedly dilated with EF 15-20% severe MR. RV at least moderately reduced. Severe TR - CPX 11/20 with preserved VO2 with moderately elevated slope - CPX 7/21 Peak VO2: 27.5 (99% predicted peak VO2)  VE/VCO2 slope: 30. RER 0.95 - Echo 5/23 EF 25-30% severe MR RV moderately reduced - Remains stable NYHA I-II - Volume ok. Takes lasix every other day  - Off Toprol XL 12.5 mg with addition of amio and bradycardia - Continue candesartan 4 mg bid. Did not tolerate Entresto 24/26 bid - Continue Farxiga 10  - Continue spiro - Blood Type O positive - ICD interrogated in clinic. No  VT/AF. Volume ok. Personally reviewed - Difficult situation. He has severe biventricular dysfunction with severe MR/TR. But CPX from 7/21 was suprisingly good. previously reviewed his case with structural team and they feel he likely would not benefit from MitraClip  (more like [place] patient). I also discussed with Dr. Mosetta Pigeon at [facility] and based on CPX and h/o relatively recent head & neck cancer risk of transplant/immunosuppression likely outweighed benefits of transplant given his pVO2. - Labs today - Repeat echo in 6 months  2. VT/VF - ICD firing x 2 on 03/22/22. Had NSVT (12sec) on 4/11 which self-converted - on amio 200 daily and mexilitene. Will d/w [name] if we can reduce amio to 100 daily  - Check labs - has seen [name]. Discussed possible addition of atrial lead for bradycardia. Stable without it  3. PAF with ICD firing in 9/21 - in NSR. Continue amio - Check labs. Had recent eye appointment - continue Eliquis  4. CAD S/P CABG 2006 - No s/s angina - Coronary angio 10/20 with stable revascularization. - Continue statin. Off ASA with Eliquis use.  5. Hyperlipidemia - Continue Crestor. Goal LDL < 70  - Per PCP  6. RBBB - stable   7. Throat Cancer - on R side. Squamous Cell Carcinoma with unclear primary.  - s/p chemo/XRT at [facility] in 2018 (Finished treatments in 2/19) - remains cancer free on recent eval  8. Severe mitral regurgitation - plan as above. Echo reviewed by Structural Team felt not to be candidate for MitraClip - no change - repeat echo at next visit   Signed, Glori Bickers, MD  11/25/2022 9:46 AM  [facility] [place] [address] [phone] (office) [phone] (fax)

## 2022-11-25 NOTE — Addendum Note (Signed)
Encounter addended by: Scarlette Calico, RN on: 11/25/2022 10:16 AM  Actions taken: Visit diagnoses modified, Order list changed, Diagnosis association updated, Clinical Note Signed, Charge Capture section accepted

## 2022-11-25 NOTE — Progress Notes (Signed)
Remote ICD transmission.   

## 2022-11-27 LAB — T3, FREE: T3, Free: 1.8 pg/mL — ABNORMAL LOW (ref 2.0–4.4)

## 2022-12-24 ENCOUNTER — Telehealth: Payer: Self-pay | Admitting: Pharmacist

## 2022-12-24 NOTE — Telephone Encounter (Signed)
Received fax that patient was not approved for BMS / Eliquis medication assistance program due to income limits.  Will have notification scanned to patient's chart.

## 2023-01-26 ENCOUNTER — Telehealth: Payer: Self-pay

## 2023-01-26 ENCOUNTER — Other Ambulatory Visit: Payer: Self-pay | Admitting: *Deleted

## 2023-01-26 ENCOUNTER — Other Ambulatory Visit (HOSPITAL_COMMUNITY): Payer: Self-pay | Admitting: Internal Medicine

## 2023-01-26 MED ORDER — SPIRONOLACTONE 25 MG PO TABS: 12.5000 mg | ORAL_TABLET | Freq: Every day | ORAL | 3 refills | Status: DC

## 2023-01-26 NOTE — Patient Outreach (Signed)
  Care Coordination   01/26/2023 Name: Matthew Joyce Sr. MRN: [id number] DOB: Mar 14, 1951   Care Coordination Outreach Attempts:  An unsuccessful telephone outreach was attempted today to offer the patient information about available care coordination services as a benefit of their health plan.   Follow Up Plan:  Additional outreach attempts will be made to offer the patient care coordination information and services.   Encounter Outcome:  No Answer   Care Coordination Interventions:  No, not indicated    Thea Silversmith, RN, MSN, BSN, [place] Coordinator [phone]

## 2023-01-26 NOTE — Patient Outreach (Signed)
  Care Coordination   Initial Visit Note   01/26/2023 Name: GARRUS GAUTHREAUX Sr. MRN: [id number] DOB: [birth year]  [name] is a 72 y.o. year old male who sees Paz, Alda Berthold, MD for primary care. I spoke with  [name] by phone today.  What matters to the patients health and wellness today?  Mr. Trefz denies any care coordination, disease management or care coordination needs at this time.    Goals Addressed             This Visit's Progress    Care coordination Activities       Interventions Today    Flowsheet Row Most Recent Value  Chronic Disease Discussed/Reviewed   Chronic disease discussed/reviewed during today's visit Other  [segmented list]  General Interventions   General Interventions Discussed/Reviewed General Interventions Discussed  [discussed upcoming scheduled appointments, encouraged to contact Care coordinator or PCP if care coordination needs in the future.]  Pharmacy Interventions   Pharmacy Dicussed/Reviewed Medication Adherence, Affording Medications            SDOH assessments and interventions completed:  Yes  SDOH Interventions Today    Flowsheet Row Most Recent Value  SDOH Interventions   Food Insecurity Interventions Intervention Not Indicated  Housing Interventions Intervention Not Indicated  Transportation Interventions Intervention Not Indicated     Care Coordination Interventions:  Yes, provided   Follow up plan: No further intervention required.   Encounter Outcome:  Pt. Visit Completed   Thea Silversmith, RN, MSN, BSN, [place] Coordinator [phone]

## 2023-01-26 NOTE — Telephone Encounter (Signed)
Refill spironolactone per request from [facility].

## 2023-01-27 ENCOUNTER — Ambulatory Visit: Payer: Medicare HMO

## 2023-01-27 DIAGNOSIS — I472 Ventricular tachycardia, unspecified: Secondary | ICD-10-CM | POA: Diagnosis not present

## 2023-01-27 LAB — CUP PACEART REMOTE DEVICE CHECK
Battery Remaining Longevity: 45 mo
Battery Remaining Percentage: 51 %
Battery Voltage: 2.95 V
Brady Statistic RV Percent Paced: 1.1 %
Date Time Interrogation Session: 20240213040017
HighPow Impedance: 46 Ohm
HighPow Impedance: 46 Ohm
Implantable Lead Connection Status: 753985
Implantable Lead Implant Date: 20090618
Implantable Lead Location: 753860
Implantable Lead Model: 7121
Implantable Pulse Generator Implant Date: 20180220
Lead Channel Impedance Value: 390 Ohm
Lead Channel Pacing Threshold Amplitude: 1.25 V
Lead Channel Pacing Threshold Pulse Width: 1 ms
Lead Channel Sensing Intrinsic Amplitude: 4.7 mV
Lead Channel Setting Pacing Amplitude: 4 V
Lead Channel Setting Pacing Pulse Width: 1 ms
Lead Channel Setting Sensing Sensitivity: 0.5 mV
Pulse Gen Serial Number: 7406967

## 2023-01-29 ENCOUNTER — Ambulatory Visit (INDEPENDENT_AMBULATORY_CARE_PROVIDER_SITE_OTHER): Payer: Medicare HMO | Admitting: Internal Medicine

## 2023-01-29 ENCOUNTER — Encounter: Payer: Self-pay | Admitting: Internal Medicine

## 2023-01-29 ENCOUNTER — Ambulatory Visit: Payer: Medicare HMO | Admitting: Internal Medicine

## 2023-01-29 ENCOUNTER — Other Ambulatory Visit: Payer: Self-pay | Admitting: Internal Medicine

## 2023-01-29 VITALS — BP 102/64 | HR 48 | Temp 97.6°F | Resp 16 | Ht 69.0 in | Wt 145.2 lb

## 2023-01-29 DIAGNOSIS — Z23 Encounter for immunization: Secondary | ICD-10-CM

## 2023-01-29 DIAGNOSIS — E785 Hyperlipidemia, unspecified: Secondary | ICD-10-CM | POA: Diagnosis not present

## 2023-01-29 DIAGNOSIS — R7303 Prediabetes: Secondary | ICD-10-CM | POA: Diagnosis not present

## 2023-01-29 DIAGNOSIS — R7989 Other specified abnormal findings of blood chemistry: Secondary | ICD-10-CM

## 2023-01-29 DIAGNOSIS — I5022 Chronic systolic (congestive) heart failure: Secondary | ICD-10-CM | POA: Diagnosis not present

## 2023-01-29 DIAGNOSIS — R430 Anosmia: Secondary | ICD-10-CM | POA: Diagnosis not present

## 2023-01-29 DIAGNOSIS — C770 Secondary and unspecified malignant neoplasm of lymph nodes of head, face and neck: Secondary | ICD-10-CM | POA: Diagnosis not present

## 2023-01-29 DIAGNOSIS — R739 Hyperglycemia, unspecified: Secondary | ICD-10-CM

## 2023-01-29 DIAGNOSIS — C801 Malignant (primary) neoplasm, unspecified: Secondary | ICD-10-CM | POA: Diagnosis not present

## 2023-01-29 NOTE — Patient Instructions (Addendum)
  GO TO THE FRONT DESK, PLEASE SCHEDULE YOUR APPOINTMENTS Come back for blood work in 4 weeks  Come back for a checkup in 4 months   Okay so not to appeal the vaccines I recommend:  Covid booster   Please bring [place] a copy of your Healthcare Power of Attorney for your chart.

## 2023-01-29 NOTE — Progress Notes (Signed)
Subjective:    Patient ID: Matthew Romans Sr., male    DOB: 15-Jun-1951, 72 y.o.   MRN: [id number]  DOS:  01/29/2023 Type of visit - description: Routine follow-up  Since the last visit, things are stable. Saw cardiology, note reviewed.  Denies chest pain or difficulty breathing.  No lower extremity edema Still has some difficulty swallowing. BP is slightly low, very seldom he gets mildly dizzy.  Wt Readings from Last 3 Encounters:  01/29/23 145 lb 4 oz (65.9 kg)  11/25/22 149 lb 6.4 oz (67.8 kg)  09/30/22 145 lb 8 oz (66 kg)   BP Readings from Last 3 Encounters:  01/29/23 102/64  11/25/22 (!) 100/58  09/30/22 106/68     Review of Systems See above   Past Medical History:  Diagnosis Date   AICD (automatic cardioverter/defibrillator) present 6/09   St. Jude   Bradycardia    Use of beta blocker limited   CAD (coronary artery disease)    a- S/p cabg in march 2006 by Dr.Owen;  b. myoview 5/12: large IL scar from apex to base, no ischemia, EF 37%   Cancer (HCC)    on right side of neck   ED (erectile dysfunction)    Gout    Hyperlipidemia    Ischemic cardiomyopathy    a-Echocardiogram, Nov 2006, showed ejection fraction of 30-40% with mild to moderated mitral  regurgitation and mild aortic regurgitation b- Cardiac MRI, May 2007, EF of 44% with 50% scar involving  the inferolateral walls. No comment of mitral regurgitation. c- last echo  11/10: EF 30-35%  mod MR d- Nega T-Wave alternans testing in May of 2007 e- no ACE due to low BP;  f. CPX 3/11: normal   RBBB (right bundle branch block with left anterior fascicular block)    Systolic CHF, chronic (HCC)    Thyroid disease     Past Surgical History:  Procedure Laterality Date   CARDIAC DEFIBRILLATOR PLACEMENT  06/01/08   ICD   COLONOSCOPY     COLONOSCOPY WITH PROPOFOL N/A 07/28/2019   Procedure: COLONOSCOPY WITH PROPOFOL;  Surgeon: [name], MD;  Location: [facility];  Service: Endoscopy;  Laterality: N/A;    CORONARY ARTERY BYPASS GRAFT  02-2005   ICD GENERATOR CHANGEOUT N/A 02/03/2017   Procedure: ICD Generator Changeout;  Surgeon: [name], MD;  Location: [place] [facility];  Service: Cardiovascular;  Laterality: N/A;   ORIF ANKLE FRACTURE  1987   left    RIGHT/LEFT HEART CATH AND CORONARY/GRAFT ANGIOGRAPHY N/A 09/20/2019   Procedure: RIGHT/LEFT HEART CATH AND CORONARY/GRAFT ANGIOGRAPHY;  Surgeon: [name], MD;  Location: [place] [facility];  Service: Cardiovascular;  Laterality: N/A;   SHOULDER ARTHROSCOPY  02/2010   rt    TEE WITHOUT CARDIOVERSION N/A 10/24/2019   Procedure: TRANSESOPHAGEAL ECHOCARDIOGRAM (TEE);  Surgeon: [name], MD;  Location[facility] END[facility]COPY;  Service: Cardiovascular;  Laterality: N/A;   TONSILLECTOMY     as a child    Current Outpatient Medications  Medication Instructions   acetaminophen (TYLENOL) 650 mg, Oral, Every 6 hours PRN   amiodarone (PACERONE) 100 mg, Oral, Daily   candesartan (ATACAND) 8 MG tablet TAKE 1/2 TABLET (4 MG TOTAL) BY MOUTH 2 (TWO) TIMES DAILY.   carboxymethylcellul-glycerin (OPTIVE) 0.5-0.9 % ophthalmic solution 1 drop, Both Eyes, Daily   cetirizine (ZYRTEC) 10 mg, Oral, As needed,     Colchicine (MITIGARE) 0.6 mg, Oral, 2 times daily PRN   dapagliflozin propanediol (FARXIGA)  10 MG TABS tablet TAKE 1 TABLET BY MOUTH EVERY DAY BEFORE BREAKFAST   ELIQUIS 5 MG TABS tablet TAKE 1 TABLET BY MOUTH TWICE A DAY   Fish Oil 3,600 mg, Oral, Daily   furosemide (LASIX) 40 mg, Oral, Every other day   levothyroxine (SYNTHROID) 88 mcg, Oral, Daily before breakfast   mexiletine (MEXITIL) 250 mg, Oral, 2 times daily   nitroGLYCERIN (NITROSTAT) 0.4 mg, Sublingual, Every 5 min x3 PRN   rosuvastatin (CRESTOR) 40 MG tablet TAKE 1 TABLET BY MOUTH EVERYDAY AT BEDTIME   spironolactone (ALDACTONE) 12.5 mg, Oral, Daily       Objective:   Physical Exam BP 102/64   Pulse (!) 48   Temp 97.6 F (36.4 C) (Oral)   Resp 16   Ht 5' 9"$  (1.753 m)    Wt 145 lb 4 oz (65.9 kg)   SpO2 93%   BMI 21.45 kg/m  General:   Well developed, NAD, BMI noted. HEENT:  Normocephalic . Face symmetric, atraumatic Lungs:  CTA B Normal respir,  no murmuratory effort, no intercostal retractions, no accessory muscle use. Heart: brady.  Lower extremities: no pretibial edema bilaterally  Skin: Not pale. Not jaundice Neurologic:  alert & oriented X3.  Speech normal, gait appropriate for age and unassisted Psych--  Cognition and judgment appear intact.  Cooperative with normal attention span and concentration.  Behavior appropriate. No anxious or depressed appearing.      Assessment     Assessment Prediabetes Hyperlipidemia Gout Hypothyroidism-  previously subclinical dz, then rx amidarone 08-2020 and TSH increased to 15 12/2020, started levothyroxine E.D. 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 S/p chemo x 6, XRT x 35; treatment ended 01/2018  PLAN: Prediabetes: Check A1c next month Hyperlipidemia: Well-controlled Hypothyroidism: See previous comments, he had subclinical hypothyroidism but the TSH increase after he was prescribed amiodarone.  Situation was discussed with endocrinology, as long as he is on levothyroxine the focus should be on keep the TSH balance.  Plan: Check TFTs in 1 month. Cardiovascular: Saw cardiology 11/25/2022, he was not noted to be a candidate for MitraClip or heart transplant. Was noted to be in NSR. Currently doing well.   Despite his medical problems, he seems to have a good QOL: Goes to the gym regularly, plays golf.  What bothers him the most is anosmia, he has to force himself to eat.  Weight is hovering around the 140s.   Anosmia,As above Preventive care: PNM 20 today, COVID-vaccine recommended. EtOH:  Currently not drinking RTC labs 1 month RTC checkup 4 months

## 2023-01-30 NOTE — Assessment & Plan Note (Signed)
Prediabetes: Check A1c next month Hyperlipidemia: Well-controlled Hypothyroidism: See previous comments, he had subclinical hypothyroidism but the TSH increase after he was prescribed amiodarone.  Situation was discussed with endocrinology, as long as he is on levothyroxine the focus should be on keep the TSH balance.  Plan: Check TFTs in 1 month. Cardiovascular: Saw cardiology 11/25/2022, he was not noted to be a candidate for MitraClip or heart transplant. Was noted to be in NSR. Currently doing well.   Despite his medical problems, he seems to have a good QOL: Goes to the gym regularly, plays golf.  What bothers him the most is anosmia, he has to force himself to eat.  Weight is hovering around the 140s.   Anosmia,As above Preventive care: PNM 20 today, COVID-vaccine recommended. EtOH: Currently not drinking RTC labs 1 month RTC checkup 4 months

## 2023-02-23 ENCOUNTER — Telehealth: Payer: Self-pay

## 2023-02-23 NOTE — Telephone Encounter (Signed)
Alert received from [employer]:  Device alert for sustained VT/VF with successful therapy Event occurred 3/9 @ 23:00, EGM shows sustained VT/VF, rate 244, ATP delivered x1 with no change followed by 25J of HV therapy converting arrhythmia to regular VP - route to triage  Outreach made to Pt.  Per Pt he was asleep during this episode.  He states he usually is awakened when he gets a shock, but he did not wake up for this episodes.  He is compliant with medications as ordered.  Amiodarone reduced to 100 mg daily 11/25/2022.  Also taking mexiletine 250 mg PO BID.  He is scheduled for lab work with his PCP this Thursday.  Will forward to Dr. Haroldine Laws and [name] for advisement.

## 2023-02-24 ENCOUNTER — Telehealth: Payer: Self-pay

## 2023-02-24 MED ORDER — AMIODARONE HCL 200 MG PO TABS: 200.0000 mg | ORAL_TABLET | Freq: Every day | ORAL | 3 refills | Status: DC

## 2023-02-24 MED ORDER — APIXABAN 5 MG PO TABS: 5.0000 mg | ORAL_TABLET | Freq: Two times a day (BID) | ORAL | 11 refills | Status: DC

## 2023-02-24 NOTE — Telephone Encounter (Signed)
Pt called, per request received from Device RN.  Device RN stated Pt must be called, and told to increase his Amiodarone dose to 1 tablet ( 200 mg ) daily.  ( Starting today as ordered by provider. )  Pt contacted and asked how much Amiodarone he had taken today?  Pt stated he took 1/2 tablet or 100 mg of Amiodarone.  Per Device RN Pt received a shock today and Pt confirmed this.  Pt advised to take another 100 mg of Amiodarone to equal 200 mg, or 1 FULL dose for today.  Pt understood the order, and stated he will take  another half tablet today, 02/24/2023.    Pt also advised from today moving forward, to now: Take 1 tablet ( 200 mg) by mouth daily; of his Amiodarone.  Pt verbalized understanding stating he will take a full tablet from now on.   Private message sent back to Device RN letting their team know Pt understood / will do as advised by [facility] Device team.

## 2023-02-24 NOTE — Telephone Encounter (Signed)
See phone encounter by Forde Dandy, RN. He spoke with patient today and gave Amio instructions.  See his full note for details.

## 2023-02-24 NOTE — Telephone Encounter (Signed)
Increase amio back to 200 mg daily. GT

## 2023-02-25 ENCOUNTER — Other Ambulatory Visit: Payer: Self-pay

## 2023-02-25 DIAGNOSIS — I472 Ventricular tachycardia, unspecified: Secondary | ICD-10-CM

## 2023-02-25 NOTE — Telephone Encounter (Signed)
Pt is getting lab work at PCP on Thursday.  This nurse added BMP and magnesium to phone note.

## 2023-02-26 ENCOUNTER — Other Ambulatory Visit (INDEPENDENT_AMBULATORY_CARE_PROVIDER_SITE_OTHER): Payer: Medicare HMO

## 2023-02-26 DIAGNOSIS — I472 Ventricular tachycardia, unspecified: Secondary | ICD-10-CM | POA: Diagnosis not present

## 2023-02-26 DIAGNOSIS — R7989 Other specified abnormal findings of blood chemistry: Secondary | ICD-10-CM | POA: Diagnosis not present

## 2023-02-26 DIAGNOSIS — R739 Hyperglycemia, unspecified: Secondary | ICD-10-CM

## 2023-02-26 LAB — HEMOGLOBIN A1C: Hgb A1c MFr Bld: 6.1 % (ref 4.6–6.5)

## 2023-02-26 LAB — BASIC METABOLIC PANEL
BUN: 21 mg/dL (ref 6–23)
CO2: 30 mEq/L (ref 19–32)
Calcium: 9.6 mg/dL (ref 8.4–10.5)
Chloride: 103 mEq/L (ref 96–112)
Creatinine, Ser: 0.97 mg/dL (ref 0.40–1.50)
GFR: 77.93 mL/min (ref >60.00)
Glucose, Bld: 83 mg/dL (ref 70–99)
Potassium: 4.1 mEq/L (ref 3.5–5.1)
Sodium: 140 mEq/L (ref 135–145)

## 2023-02-26 LAB — T3, FREE: T3, Free: 2.3 pg/mL (ref 2.3–4.2)

## 2023-02-26 LAB — MAGNESIUM: Magnesium: 2.2 mg/dL (ref 1.5–2.5)

## 2023-02-26 LAB — TSH: TSH: 2.42 u[IU]/mL (ref 0.35–5.50)

## 2023-02-26 LAB — T4, FREE: Free T4: 1.6 ng/dL (ref 0.60–1.60)

## 2023-02-27 NOTE — Progress Notes (Signed)
Remote ICD transmission.   

## 2023-03-03 ENCOUNTER — Telehealth: Payer: Self-pay | Admitting: Pharmacist

## 2023-03-03 MED ORDER — DAPAGLIFLOZIN PROPANEDIOL 10 MG PO TABS: 10.0000 mg | ORAL_TABLET | Freq: Every day | ORAL | 3 refills | Status: DC

## 2023-03-03 NOTE — Telephone Encounter (Signed)
Patient called to report he has about 2 weeks of Farxiga left but has not received delivery from [facility] and [facility] recently. Looks like an updated Rx is needed.  Sent Rx for Farxiga to [facility]. Patient should receive in 5 to 7 business days.

## 2023-03-10 ENCOUNTER — Telehealth: Payer: Self-pay | Admitting: Pharmacist

## 2023-03-10 MED ORDER — DAPAGLIFLOZIN PROPANEDIOL 10 MG PO TABS: 10.0000 mg | ORAL_TABLET | Freq: Every day | ORAL | 3 refills | Status: DC

## 2023-03-10 NOTE — Telephone Encounter (Signed)
Patient called stating he has not received Farxiga From [facility] yet. Sent Rx to [facility] 03/03/2023.  Called [place] and [facility] and she was not able to see Rx form 03/03/2023 - resent Rx to [facility].  Will take 7 to 10 business days for patient to received delivery. Patient only has about 10 tablets left. Will leave #7 tablets of Farxiga sample at front desk to last until medication assistance program deliver arrives.

## 2023-04-21 ENCOUNTER — Encounter (HOSPITAL_COMMUNITY): Payer: Medicare HMO | Admitting: Internal Medicine

## 2023-04-22 NOTE — Progress Notes (Signed)
Advanced [facility] Failure Clinic Note  Date:  04/22/2023   ID:  Matthew Paris Sr., DOB [birth year], MRN [id number]  Location: Home  Provider location: [facility] Type of Visit: Established patient  PCP:  Wanda Plump, MD  Cardiologist:  None Primary HF: Bharat Antillon  Chief Complaint: Heart Failure follow-up   History of Present Illness:  Matthew Joyce is a 72 y.o. male with a history of a CAD S/P CABG in 2006 (LIMA -> LAD, SVG -> Ramus, SVG -> OM-2, SVG -> RCA), s/p ICD, ischemic MR, DM, hyperlipidemia, and throat cancer s/p chemo/XRT in 2018   Stress test 08/14/2016 with EF 31%, old infarct, no ischemia.    Developed CP and SOB in early October. Cath on 09/20/19. Coronary anatomy was stable but EF down to 20% on cath with 3+ MR Echo on 09/20/19 read as EF 25-30% with mod MR. (I reviewed echo and EF 20-25% and moderate to severe MR). Since that time has had CPX with only mild HF limitation.  TEE EF 20-25%. 3+MR and severe TR.    On 01/29/20 woke from sleep for unknown reasons. Found to have shock for VT/VF. Saw Dr. Ladona Ridgel   On 07/22/20 had multiple runs of NSVT, SVT and 1 run of VT (20 seconds max rate 220). Felt to have gotten inappropriate ATP for AF.    Seen in 9/21 for ICD firing due to AF with RVR. Started on amio and b-blocker stopped due to bradycardia.    Echo 05/29/20: EF 20% mod to severe RV dysfunction severe central MR Personally reviewed  Echo 5/23 EF 25-30% severe MR RV moderately reduced  Here for HF f/u. Says he feels pretty good. Walking 4 days/week on TM 4.1 mph at 6% grade for 3 miles. No CP or undue SOB. Playing golf. No edema, orthopnea or PND. Weight stable in 140s. Great appetite. No ICD shocks. SBP typically 100-115   Echo today 04/23/23: EF 25-30% RV mildly down Moderate to severe MR   ICD interrogation: No VT/AF Fluid ok Personally reviewed   Cardiac studies:   Cath 09/20/19   1. 3v CAD  2. Patent LIMA to LAD 3. Patent free RIMA to  ramus 4. Occluded SVG to RCA with widely patent native RCA 5. Unable to locate SVG to OM2 mentioned in OP report. There is no graft marker for this graft and Ao root shot did not show any SVG to OM (suspect may not have been placed) 6. EF 20% with global HK and 3+ MR   PCW = 22 (v = 35-40) Fick cardiac output/index = 4.5/2.4  CPX 06/2020  FVC 3.54 (85%)      FEV1 2.81 (89%)        FEV1/FVC 79 (104%)        MVV 104 (82%)      Peak VO2: 27.5 (99% predicted peak VO2)  VE/VCO2 slope:  30  OUES: 1.99  Peak RER: 0.95    CPX test 10/20/19  BP rest: 92/56 Standing BP: 86/52 BP peak: 116/62  Peak VO2: 24.5 (88% predicted peak VO2)  VE/VCO2 slope:  37  OUES: 2.11  Peak RER: 1.01     TEE 10/24/19  1. Left ventricular ejection fraction 20-25%  2. Global right ventricle has moderately reduced systolic function.  3. Left atrial size was severely dilated.  4. No LAA clot.  5. Right atrial size was mild-moderately dilated.  6. The mitral valve is abnormal. Moderate mitral valve  regurgitation.  7. Mildly restricted posterior MV leaflet with 3+ MR.  8. The tricuspid valve is normal in structure. Tricuspid valve regurgitation is severe.  Past Medical History:  Diagnosis Date   AICD (automatic cardioverter/defibrillator) present 6/09   St. Jude   Bradycardia    Use of beta blocker limited   CAD (coronary artery disease)    a- S/p cabg in march 2006 by Dr.Owen;  b. Celine Ahr 5/12: large IL scar from apex to base, no ischemia, EF 37%   Cancer (HCC)    on right side of neck   ED (erectile dysfunction)    Gout    Hyperlipidemia    Ischemic cardiomyopathy    a-Echocardiogram, Nov 2006, showed ejection fraction of 30-40% with mild to moderated mitral  regurgitation and mild aortic regurgitation b- Cardiac MRI, May 2007, EF of 44% with 50% scar involving  the inferolateral walls. No comment of mitral regurgitation. c- last echo  11/10: EF 30-35%  mod MR d- Nega T-Wave alternans testing in May  of 2007 e- no ACE due to low BP;  f. CPX 3/11: normal   RBBB (right bundle branch block with left anterior fascicular block)    Systolic CHF, chronic (HCC)    Thyroid disease    Past Surgical History:  Procedure Laterality Date   CARDIAC DEFIBRILLATOR PLACEMENT  06/01/08   ICD   COLONOSCOPY     COLONOSCOPY WITH PROPOFOL N/A 07/28/2019   Procedure: COLONOSCOPY WITH PROPOFOL;  Surgeon: Iva Boop, MD;  Location: [facility] ENDOSCOPY;  Service: Endoscopy;  Laterality: N/A;   CORONARY ARTERY BYPASS GRAFT  02-2005   ICD GENERATOR CHANGEOUT N/A 02/03/2017   Procedure: ICD Generator Changeout;  Surgeon: Marinus Maw, MD;  Location: [facility] INVASIVE CV LAB;  Service: Cardiovascular;  Laterality: N/A;   ORIF ANKLE FRACTURE  1987   left    RIGHT/LEFT HEART CATH AND CORONARY/GRAFT ANGIOGRAPHY N/A 09/20/2019   Procedure: RIGHT/LEFT HEART CATH AND CORONARY/GRAFT ANGIOGRAPHY;  Surgeon: Dolores Patty, MD;  Location: [facility] INVASIVE CV LAB;  Service: Cardiovascular;  Laterality: N/A;   SHOULDER ARTHROSCOPY  02/2010   rt    TEE WITHOUT CARDIOVERSION N/A 10/24/2019   Procedure: TRANSESOPHAGEAL ECHOCARDIOGRAM (TEE);  Surgeon: Dolores Patty, MD;  Location: [facility] ENDOSCOPY;  Service: Cardiovascular;  Laterality: N/A;   TONSILLECTOMY     as a child     Current Outpatient Medications  Medication Sig Dispense Refill   acetaminophen (TYLENOL) 325 MG tablet Take 650 mg by mouth every 6 (six) hours as needed (for pain.).     amiodarone (PACERONE) 200 MG tablet Take 1 tablet (200 mg total) by mouth daily. 90 tablet 3   candesartan (ATACAND) 8 MG tablet TAKE 1/2 TABLET (4 MG TOTAL) BY MOUTH 2 (TWO) TIMES DAILY. 90 tablet 3   carboxymethylcellul-glycerin (OPTIVE) 0.5-0.9 % ophthalmic solution Place 1 drop into both eyes daily.     cetirizine (ZYRTEC) 10 MG tablet Take 10 mg by mouth as needed for allergies.     Colchicine (MITIGARE) 0.6 MG CAPS Take 0.6 mg by mouth 2 (two) times daily as needed (gout flare). (Patient  not taking: Reported on 01/29/2023) 60 capsule 0   dapagliflozin propanediol (FARXIGA) 10 MG TABS tablet Take 1 tablet (10 mg total) by mouth daily. (Take BEFORE BREAKFAST) 90 tablet 3   ELIQUIS 5 MG TABS tablet TAKE 1 TABLET BY MOUTH TWICE A DAY 60 tablet 6   furosemide (LASIX) 40 MG tablet Take 40 mg by mouth every  other day.     levothyroxine (SYNTHROID) 88 MCG tablet Take 1 tablet (88 mcg total) by mouth daily before breakfast. 90 tablet 0   mexiletine (MEXITIL) 250 MG capsule TAKE 1 CAPSULE BY MOUTH TWICE A DAY 180 capsule 1   nitroGLYCERIN (NITROSTAT) 0.4 MG SL tablet Place 1 tablet (0.4 mg total) under the tongue every 5 (five) minutes x 3 doses as needed for chest pain. (Patient not taking: Reported on 01/29/2023) 25 tablet 3   Omega-3 Fatty Acids (FISH OIL) 1200 MG CAPS Take 3,600 mg by mouth daily.     rosuvastatin (CRESTOR) 40 MG tablet TAKE 1 TABLET BY MOUTH EVERYDAY AT BEDTIME 90 tablet 2   spironolactone (ALDACTONE) 25 MG tablet Take 0.5 tablets (12.5 mg total) by mouth daily. 45 tablet 3   No current facility-administered medications for this encounter.    Allergies:   Atorvastatin, Entresto [sacubitril-valsartan], Sulfonamide derivatives, Sulfamethoxazole, and Tape   Social History:  The patient  reports that he quit smoking about 23 years ago. His smoking use included cigarettes. He smoked an average of .1 packs per day. He has never used smokeless tobacco. He reports that he does not currently use alcohol. He reports that he does not use drugs.   Family History:  The patient's family history includes Cardiomyopathy in his brother; Heart attack (age of onset: 29) in his father.   ROS:  Please see the history of present illness.   All other systems are personally reviewed and negative.   There were no vitals filed for this visit.  Wt Readings from Last 3 Encounters:  01/29/23 65.9 kg (145 lb 4 oz)  11/25/22 67.8 kg (149 lb 6.4 oz)  09/30/22 66 kg (145 lb 8 oz)    Exam:    General:  Thin. No resp difficulty  HEENT: normal Neck: supple. no JVD. Carotids 2+ bilat; no bruits. No lymphadenopathy or thryomegaly appreciated. Cor: PMI nondisplaced. Regular rate & rhythm. No rubs, gallops or murmurs. Lungs: clear Abdomen: soft, nontender, nondistended. No hepatosplenomegaly. No bruits or masses. Good bowel sounds. Extremities: no cyanosis, clubbing, rash, edema Neuro: alert & orientedx3, cranial nerves grossly intact. moves all 4 extremities w/o difficulty. Affect pleasant   ECG: SB 48 RBBB Personally reviewed  Recent Labs: 07/24/2022: Hemoglobin 13.3; Platelets 187 11/25/2022: ALT 23; B Natriuretic Peptide 194.3 02/26/2023: BUN 21; Creatinine, Ser 0.97; Magnesium 2.2; Potassium 4.1; Sodium 140; TSH 2.42  Personally reviewed   Wt Readings from Last 3 Encounters:  01/29/23 65.9 kg (145 lb 4 oz)  11/25/22 67.8 kg (149 lb 6.4 oz)  09/30/22 66 kg (145 lb 8 oz)      ASSESSMENT AND PLAN:  1. Chronic Systolic Heart Failure ICM ECHO 08/2013 EF 45-50%  Has St Jude ICD 2009 -  Echo 07/2016 LVEF 30-35%, Mod MR, Mod LAE, Mildly dilated RV with mild reduced RV function, PA peak pressure 51 mm Hg.  - TEE 11/20 Left ventricular ejection fraction 20-25% Moderately reduced RV function 3+ MR severe TR - Echo 6/21 LV is markedly dilated with EF 15-20% severe MR. RV at least moderately reduced. Severe TR - CPX 11/20 with preserved VO2 with moderately elevated slope - CPX 7/21 Peak VO2: 27.5 (99% predicted peak VO2)  VE/VCO2 slope: 30. RER 0.95 - Echo 5/23 EF 25-30% severe MR RV moderately reduced - Echo today 04/23/23: EF 25-30% RV mildly down Moderate to severe MR  - Remains stable NYHA I-II - Volume ok. Takes lasix every other day - has not  needed to take extra - Off Toprol XL 12.5 mg with addition of amio and bradycardia - Continue candesartan 4 mg bid. Did not tolerate Entresto 24/26 bid - Continue Farxiga 10  - Continue spiro - Blood Type O positive - ICD interrogated  in clinic as above - Difficult situation. He has severe LV dysfunction with severe MR/TR. But CPX from 7/21 was suprisingly good. previously reviewed his case with structural team and they feel he likely would not benefit from MitraClip (more like MitraFR patient). I also discussed with Dr. Edwena Blow at [facility] and based on CPX and h/o relatively recent head & neck cancer risk of transplant/immunosuppression likely outweighed benefits of transplant given his pVO2. In looking at his echo today, I do think RV may be able to handle VAD if he symptomatically deteriorated but chest may not support with previous CABG and XRT. - Labs today  2. VT/VF - ICD firing x 2 on 03/22/22. Had NSVT (12sec) on 4/11 which self-converted - on amio 200 daily and mexilitene. Previously decrease to 200 qod and had recurrent VT - Check labs  3. PAF with ICD firing in 9/21 - in NSR. Continue amio - Check labs.  - continue Eliquis. No bleeding  4. CAD S/P CABG 2006 - No s/s angina - Coronary angio 10/20 with stable revascularization. - Continue statin. Off ASA with Eliquis use.  5. Hyperlipidemia - Continue Crestor. Goal LDL < 70  - Per PCP  6. RBBB - stable   7. Throat Cancer - on R side. Squamous Cell Carcinoma with unclear primary.  - s/p chemo/XRT at [facility] in 2018 (Finished treatments in 2/19) - remains cancer free on recent eval  8. Severe mitral regurgitation - plan as above. Echo reviewed by Structural Team felt not to be candidate for MitraClip - no change   Signed, Arvilla Meres, MD  04/22/2023 11:54 PM  Advanced [facility] [address] [facility] [place] [address] [phone] (office) [phone] (fax)

## 2023-04-23 ENCOUNTER — Ambulatory Visit (HOSPITAL_COMMUNITY)
Admission: RE | Admit: 2023-04-23 | Discharge: 2023-04-23 | Disposition: A | Discharge status: Home / Self Care | Payer: Medicare HMO | Source: Ambulatory Visit | Attending: Internal Medicine | Admitting: Internal Medicine

## 2023-04-23 ENCOUNTER — Encounter (HOSPITAL_COMMUNITY): Payer: Self-pay | Admitting: Internal Medicine

## 2023-04-23 ENCOUNTER — Ambulatory Visit (HOSPITAL_BASED_OUTPATIENT_CLINIC_OR_DEPARTMENT_OTHER)
Admission: RE | Admit: 2023-04-23 | Discharge: 2023-04-23 | Disposition: A | Discharge status: Home / Self Care | Payer: Medicare HMO | Source: Ambulatory Visit | Attending: Internal Medicine | Admitting: Internal Medicine

## 2023-04-23 VITALS — BP 92/50 | HR 49 | Wt 144.6 lb

## 2023-04-23 DIAGNOSIS — Z85818 Personal history of malignant neoplasm of other sites of lip, oral cavity, and pharynx: Secondary | ICD-10-CM | POA: Diagnosis not present

## 2023-04-23 DIAGNOSIS — I083 Combined rheumatic disorders of mitral, aortic and tricuspid valves: Secondary | ICD-10-CM | POA: Insufficient documentation

## 2023-04-23 DIAGNOSIS — Z87891 Personal history of nicotine dependence: Secondary | ICD-10-CM | POA: Insufficient documentation

## 2023-04-23 DIAGNOSIS — I4510 Unspecified right bundle-branch block: Secondary | ICD-10-CM | POA: Insufficient documentation

## 2023-04-23 DIAGNOSIS — I5022 Chronic systolic (congestive) heart failure: Secondary | ICD-10-CM

## 2023-04-23 DIAGNOSIS — I2510 Atherosclerotic heart disease of native coronary artery without angina pectoris: Secondary | ICD-10-CM

## 2023-04-23 DIAGNOSIS — I4901 Ventricular fibrillation: Secondary | ICD-10-CM | POA: Diagnosis not present

## 2023-04-23 DIAGNOSIS — E119 Type 2 diabetes mellitus without complications: Secondary | ICD-10-CM | POA: Diagnosis not present

## 2023-04-23 DIAGNOSIS — Z7901 Long term (current) use of anticoagulants: Secondary | ICD-10-CM | POA: Diagnosis not present

## 2023-04-23 DIAGNOSIS — I255 Ischemic cardiomyopathy: Secondary | ICD-10-CM | POA: Insufficient documentation

## 2023-04-23 DIAGNOSIS — Z79899 Other long term (current) drug therapy: Secondary | ICD-10-CM | POA: Diagnosis not present

## 2023-04-23 DIAGNOSIS — Z9221 Personal history of antineoplastic chemotherapy: Secondary | ICD-10-CM | POA: Insufficient documentation

## 2023-04-23 DIAGNOSIS — E785 Hyperlipidemia, unspecified: Secondary | ICD-10-CM | POA: Insufficient documentation

## 2023-04-23 DIAGNOSIS — I472 Ventricular tachycardia, unspecified: Secondary | ICD-10-CM

## 2023-04-23 DIAGNOSIS — I480 Paroxysmal atrial fibrillation: Secondary | ICD-10-CM | POA: Diagnosis not present

## 2023-04-23 DIAGNOSIS — R0602 Shortness of breath: Secondary | ICD-10-CM | POA: Diagnosis not present

## 2023-04-23 DIAGNOSIS — R001 Bradycardia, unspecified: Secondary | ICD-10-CM | POA: Diagnosis not present

## 2023-04-23 DIAGNOSIS — Z7984 Long term (current) use of oral hypoglycemic drugs: Secondary | ICD-10-CM | POA: Insufficient documentation

## 2023-04-23 DIAGNOSIS — Z4502 Encounter for adjustment and management of automatic implantable cardiac defibrillator: Secondary | ICD-10-CM | POA: Insufficient documentation

## 2023-04-23 DIAGNOSIS — Z951 Presence of aortocoronary bypass graft: Secondary | ICD-10-CM | POA: Diagnosis not present

## 2023-04-23 DIAGNOSIS — Z923 Personal history of irradiation: Secondary | ICD-10-CM | POA: Insufficient documentation

## 2023-04-23 LAB — COMPREHENSIVE METABOLIC PANEL
ALT: 20 U/L (ref 0–44)
AST: 23 U/L (ref 15–41)
Albumin: 3.9 g/dL (ref 3.5–5.0)
Alkaline Phosphatase: 57 U/L (ref 38–126)
Anion gap: 11 (ref 5–15)
BUN: 22 mg/dL (ref 8–23)
CO2: 26 mmol/L (ref 22–32)
Calcium: 9.5 mg/dL (ref 8.9–10.3)
Chloride: 100 mmol/L (ref 98–111)
Creatinine, Ser: 1.06 mg/dL (ref 0.61–1.24)
GFR, Estimated: >60 mL/min (ref >60)
Glucose, Bld: 89 mg/dL (ref 70–99)
Potassium: 4.2 mmol/L (ref 3.5–5.1)
Sodium: 137 mmol/L (ref 135–145)
Total Bilirubin: 1.3 mg/dL — ABNORMAL HIGH (ref 0.3–1.2)
Total Protein: 6.4 g/dL — ABNORMAL LOW (ref 6.5–8.1)

## 2023-04-23 LAB — TSH: TSH: 1.804 u[IU]/mL (ref 0.350–4.500)

## 2023-04-23 LAB — CBC
HCT: 44.8 % (ref 39.0–52.0)
Hemoglobin: 14.9 g/dL (ref 13.0–17.0)
MCH: 30.3 pg (ref 26.0–34.0)
MCHC: 33.3 g/dL (ref 30.0–36.0)
MCV: 91.1 fL (ref 80.0–100.0)
Platelets: 209 10*3/uL (ref 150–400)
RBC: 4.92 MIL/uL (ref 4.22–5.81)
RDW: 13 % (ref 11.5–15.5)
WBC: 8.2 10*3/uL (ref 4.0–10.5)
nRBC: 0 % (ref 0.0–0.2)

## 2023-04-23 LAB — ECHOCARDIOGRAM COMPLETE
Area-P 1/2: 4.96 cm2
Calc EF: 29.9 %
MV M vel: 5.27 m/s
MV Peak grad: 111.1 mmHg
Radius: 0.5 cm
S' Lateral: 5.7 cm
Single Plane A2C EF: 31.1 %
Single Plane A4C EF: 28.3 %

## 2023-04-23 LAB — T4, FREE: Free T4: 1.84 ng/dL — ABNORMAL HIGH (ref 0.61–1.12)

## 2023-04-23 NOTE — Progress Notes (Signed)
Echocardiogram 2D Echocardiogram has been performed.  Toni Amend 04/23/2023, 8:36 AM

## 2023-04-23 NOTE — Patient Instructions (Signed)
There has been no changes to your medications.  Labs done today, your results will be available in MyChart, we will contact you for abnormal readings.  Your provider has ordered a chest X-ray for you.  Your physician has recommended that you have a pulmonary function test. Pulmonary Function Tests are a group of tests that measure how well air moves in and out of your lungs.  Your physician recommends that you schedule a follow-up appointment in: 6 months(November) ** please call the office in August to arrange your follow up appointment. **  If you have any questions or concerns before your next appointment please send [place] a message through [place] or call our office at [phone].    TO LEAVE A MESSAGE FOR THE NURSE SELECT OPTION 2, PLEASE LEAVE A MESSAGE INCLUDING: YOUR NAME DATE OF BIRTH CALL BACK NUMBER REASON FOR CALL**this is important as we prioritize the call backs  YOU WILL RECEIVE A CALL BACK THE SAME DAY AS LONG AS YOU CALL BEFORE 4:00 PM  At the [facility], you and your health needs are our priority. As part of our continuing mission to provide you with exceptional heart care, we have created designated Provider Care Teams. These Care Teams include your primary Cardiologist (physician) and Advanced Practice Providers (APPs- Physician Assistants and Nurse Practitioners) who all work together to provide you with the care you need, when you need it.   You may see any of the following providers on your designated Care Team at your next follow up: Dr Arvilla Meres Dr Marca Ancona Dr. Marcos Eke, NP Robbie Lis, [place] [facility] [place], [place] Brynda Peon, NP Karle Plumber, PharmD   Please be sure to bring in all your medications bottles to every appointment.    Thank you for choosing [facility]

## 2023-04-23 NOTE — Addendum Note (Signed)
Encounter addended by: Linda Hedges, RN on: 04/23/2023 9:47 AM  Actions taken: Order list changed, Diagnosis association updated, Clinical Note Signed, Charge Capture section accepted

## 2023-04-25 ENCOUNTER — Other Ambulatory Visit: Payer: Self-pay | Admitting: Internal Medicine

## 2023-04-27 ENCOUNTER — Other Ambulatory Visit: Payer: Self-pay | Admitting: Pharmacist

## 2023-04-27 MED ORDER — LEVOTHYROXINE SODIUM 88 MCG PO TABS: 88.0000 ug | ORAL_TABLET | Freq: Every day | ORAL | 1 refills | Status: DC

## 2023-04-27 NOTE — Progress Notes (Signed)
Patient called to request refill for levothyroxine daily.  He requested last week but refill was sent to cardiology office and denied.  Sent in refill to [facility] [employer] [facility] [address].   Meds ordered this encounter  Medications   levothyroxine (SYNTHROID) 88 MCG tablet    Sig: Take 1 tablet (88 mcg total) by mouth daily before breakfast.    Dispense:  90 tablet    Refill:  1

## 2023-04-28 ENCOUNTER — Ambulatory Visit (INDEPENDENT_AMBULATORY_CARE_PROVIDER_SITE_OTHER): Payer: Medicare HMO

## 2023-04-28 ENCOUNTER — Telehealth (HOSPITAL_COMMUNITY): Payer: Self-pay

## 2023-04-28 ENCOUNTER — Other Ambulatory Visit (HOSPITAL_COMMUNITY): Payer: Self-pay

## 2023-04-28 DIAGNOSIS — I472 Ventricular tachycardia, unspecified: Secondary | ICD-10-CM

## 2023-04-28 DIAGNOSIS — R911 Solitary pulmonary nodule: Secondary | ICD-10-CM

## 2023-04-28 NOTE — Telephone Encounter (Signed)
[facility] called about his chest x-ray noting a right apical nodular opacity. Recommend further evaluation with chest CT.  Just FYI to review.

## 2023-04-30 ENCOUNTER — Ambulatory Visit (INDEPENDENT_AMBULATORY_CARE_PROVIDER_SITE_OTHER): Payer: Medicare HMO | Admitting: Pharmacist

## 2023-04-30 DIAGNOSIS — I480 Paroxysmal atrial fibrillation: Secondary | ICD-10-CM

## 2023-04-30 DIAGNOSIS — I5022 Chronic systolic (congestive) heart failure: Secondary | ICD-10-CM

## 2023-04-30 DIAGNOSIS — I25810 Atherosclerosis of coronary artery bypass graft(s) without angina pectoris: Secondary | ICD-10-CM

## 2023-04-30 DIAGNOSIS — E785 Hyperlipidemia, unspecified: Secondary | ICD-10-CM

## 2023-04-30 MED ORDER — ROSUVASTATIN CALCIUM 40 MG PO TABS: 40.0000 mg | ORAL_TABLET | Freq: Every day | ORAL | 2 refills | Status: DC

## 2023-04-30 NOTE — Progress Notes (Signed)
Pharmacy Note  04/30/2023 Name: Matthew KEIR Sr. MRN: [id number] DOB: 05/27/1951  Subjective: Matthew Paris Sr. is a 72 y.o. year old male who is a primary care patient of Wanda Plump, MD. Clinical Pharmacist Practitioner referral was placed to assist with medication management.    Engaged with patient by telephone for follow up visit today.  Hypertension / CHF Current therapy candasartan 8mg  daily, spironolacton 25mg  0.5 tablet = 12.5mg  daily, furosemide 40mg  every OTHER day and Farxiga 10mg  daily.   Reviewed refill history - [facility] cand[facility]  for #13 tabs 04/23/2023 - patient has not picked up yet. He was not sure why only filled for #13.   Had follow up with Dr Gala Romney 04/23/2023 ECHO 04/23/2023 show stable EF of 25-30%  Patient state he feels wells. He is golfing several times per week and walking for exercise.   Hyperlipidemia:  Last LDL was at goal of < 55 Taking rosuvastatin 40mg  daily - needs [facility].    Objective: Review of patient status, including review of consultants reports, laboratory and other test data, was performed as part of comprehensive evaluation and provision of chronic care management services.   Lab Results  Component Value Date   CREATININE 1.06 04/23/2023   CREATININE 0.97 02/26/2023   CREATININE 1.03 11/25/2022    Lab Results  Component Value Date   HGBA1C 6.1 02/26/2023       Component Value Date/Time   CHOL 123 06/19/2022 0832   TRIG 52.0 06/19/2022 0832   TRIG 45 12/16/2006 0912   HDL 59.10 06/19/2022 0832   CHOLHDL 2 06/19/2022 0832   VLDL 10.4 06/19/2022 0832   LDLCALC 54 06/19/2022 0832     Clinical ASCVD: Yes  The 10-year ASCVD risk score (Arnett DK, et al., 2019) is: 9%   Values used to calculate the score:     Age: 83 years     Sex: Male     Is Non-Hispanic African American: No     Diabetic: No     Tobacco smoker: No     Systolic Blood Pressure: 92 mmHg     Is BP treated:  No     HDL Cholesterol: 66 mg/dL     Total Cholesterol: 130 mg/dL    BP Readings from Last 3 Encounters:  04/23/23 (!) 92/50  01/29/23 102/64  11/25/22 (!) 100/58     Allergies  Allergen Reactions   Atorvastatin      myalgia, Muscle aching   Entresto [Sacubitril-Valsartan] Itching    Had itching and redness   Sulfonamide Derivatives     Rash and hot flashes   Sulfamethoxazole Rash and Other (See Comments)    Hot flashes   Tape Other (See Comments)    Rips skin- use PAPER tape    Medications Reviewed Today     Reviewed by Henrene Pastor, RPH-CPP (Pharmacist) on 04/30/23 at 1519  Med List Status: <None>   Medication Order Taking? Sig Documenting Provider Last Dose Status Informant  acetaminophen (TYLENOL) 325 MG tablet [id number] Yes Take 650 mg by mouth every 6 (six) hours as needed (for pain.). [provider] Taking Active Self           Med Note Roetta Sessions, LENA B   Wed Oct 12, 2019  2:03 PM)    amiodarone (PACERONE) 200 MG tablet [id number] Yes Take 1 tablet (200 mg total) by mouth daily. Marinus Maw, MD Taking Active   candesartan (ATACAND)  8 MG tablet [id number] Yes TAKE 1/2 TABLET (4 MG TOTAL) BY MOUTH 2 (TWO) TIMES DAILY. Bensimhon, Bevelyn Buckles, MD Taking Active   carboxymethylcellul-glycerin (OPTIVE) 0.5-0.9 % ophthalmic solution [id number] Yes Place 1 drop into both eyes daily. [provider] Taking Active Self  cetirizine (ZYRTEC) 10 MG tablet [id number] Yes Take 10 mg by mouth as needed for allergies. [provider] Taking Active Self  Colchicine (MITIGARE) 0.6 MG CAPS [id number] Yes Take 0.6 mg by mouth 2 (two) times daily as needed (gout flare). Wanda Plump, MD Taking Active            Med Note Evert Kohl Apr 23, 2023  9:08 AM)    dapagliflozin propanediol (FARXIGA) 10 MG TABS tablet [id number] Yes Take 1 tablet (10 mg total) by mouth daily. (Take BEFORE BREAKFAST) Wanda Plump, MD Taking Active   ELIQUIS 5 MG TABS tablet  [id number] Yes TAKE 1 TABLET BY MOUTH TWICE A DAY Marinus Maw, MD Taking Active   furosemide (LASIX) 40 MG tablet [id number] Yes Take 40 mg by mouth every other day. [provider] Taking Active   levothyroxine (SYNTHROID) 88 MCG tablet [id number] Yes Take 1 tablet (88 mcg total) by mouth daily before breakfast. Wanda Plump, MD Taking Active   mexiletine (MEXITIL) 250 MG capsule [id number] Yes TAKE 1 CAPSULE BY MOUTH TWICE A DAY Bensimhon, Bevelyn Buckles, MD Taking Active   nitroGLYCERIN (NITROSTAT) 0.4 MG SL tablet [id number] Yes Place 1 tablet (0.4 mg total) under the tongue every 5 (five) minutes x 3 doses as needed for chest pain. Wanda Plump, MD Taking Active            Med Note Suezanne Cheshire   Thu Apr 23, 2023  9:09 AM)    Omega-3 Fatty Acids (FISH OIL) 1200 MG CAPS [id number] Yes Take 3,600 mg by mouth daily. [provider] Taking Active Self  rosuvastatin (CRESTOR) 40 MG tablet [id number] Yes TAKE 1 TABLET BY MOUTH EVERYDAY AT BEDTIME Worthy Rancher B, FNP Taking Active   spironolactone (ALDACTONE) 25 MG tablet [id number] Yes Take 0.5 tablets (12.5 mg total) by mouth daily. 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 (HCC) 11/12/2017   PCP NOTES >>>>>>>>>>>>>>>>>>>>> 04/21/2016   Seborrheic dermatitis of scalp 04/11/2013   Annual physical exam 04/11/2011   MITRAL REGURGITATION 04/10/2010   SYSTOLIC HEART FAILURE, CHRONIC 10/03/2009   Hyperglycemia 06/19/2008   Coronary  atherosclerosis 06/14/2008   Automatic implantable cardioverter-defibrillator in situ 06/01/2008   ERECTILE DYSFUNCTION 12/17/2007   Hyperlipidemia 02/06/2007   Gout 02/06/2007   RBBB 02/06/2007   Congestive heart failure (HCC) 02/06/2007   CORONARY ARTERY BYPASS GRAFT, FOUR VESSEL, HX OF 02/12/2005     Medication Assistance:   Marcelline Deist obtained through [facility] and Me Program  medication assistance program.  Enrollment ends 12/15/2023  Patient has not spend 3% out of pocket for 2024 so unable to apply for Eliquis medication assistance program yet.  Assessment / Plan: Hyperlipidemia - LDL goal < 55 - at goal  Continue rosuvastatin daily [facility]  Meds ordered this encounter  Medications   rosuvastatin (CRESTOR) 40 MG tablet    Sig: Take 1 tablet (40 mg total) by mouth daily.    Dispense:  90 tablet    Refill:  2    HFrEF - stable Continue Farxiga 10mg  daily, spironolactone 25mg  daily and candesartan 8mg  daily and furosemide 40mg  every OTHER day [facility] and asked if [facility] would fill candesartan for 90 days instead of #13 tabs (per pharmacist their system did this automatically to try to sync his med refills)   Afib Continue amiodarone and Eliquis.  Monitoring out of pocket spend to see if can apply for Eliquis medication assistance program later in 2024.    Follow Up:  Telephone follow up appointment with care management team member scheduled for:  3-4 months   Henrene Pastor, PharmD Clinical Pharmacist [facility] Primary [facility]  - [facility] [phone]

## 2023-05-01 ENCOUNTER — Other Ambulatory Visit: Payer: Self-pay | Admitting: Internal Medicine

## 2023-05-01 DIAGNOSIS — I480 Paroxysmal atrial fibrillation: Secondary | ICD-10-CM

## 2023-05-04 LAB — CUP PACEART REMOTE DEVICE CHECK
Battery Remaining Longevity: 42 mo
Battery Remaining Percentage: 48 %
Battery Voltage: 2.93 V
Brady Statistic RV Percent Paced: 1.2 %
Date Time Interrogation Session: 20240518195139
HighPow Impedance: 50 Ohm
HighPow Impedance: 51 Ohm
Implantable Lead Connection Status: 753985
Implantable Lead Implant Date: 20090618
Implantable Lead Location: 753860
Implantable Lead Model: 7121
Implantable Pulse Generator Implant Date: 20180220
Lead Channel Impedance Value: 390 Ohm
Lead Channel Pacing Threshold Amplitude: 1.25 V
Lead Channel Pacing Threshold Pulse Width: 1 ms
Lead Channel Sensing Intrinsic Amplitude: 3.4 mV
Lead Channel Setting Pacing Amplitude: 4 V
Lead Channel Setting Pacing Pulse Width: 1 ms
Lead Channel Setting Sensing Sensitivity: 0.5 mV
Pulse Gen Serial Number: 7406967

## 2023-05-04 NOTE — Telephone Encounter (Signed)
Prescription refill request for Eliquis received. Indication: a fib Last office visit: 04/23/23 Scr: 1.06 04/23/23 epic Age: 72 Weight: 65 kg

## 2023-05-07 ENCOUNTER — Ambulatory Visit (HOSPITAL_COMMUNITY)
Admission: RE | Admit: 2023-05-07 | Discharge: 2023-05-07 | Disposition: A | Discharge status: Home / Self Care | Payer: Medicare HMO | Source: Ambulatory Visit | Attending: Internal Medicine | Admitting: Internal Medicine

## 2023-05-07 DIAGNOSIS — I5022 Chronic systolic (congestive) heart failure: Secondary | ICD-10-CM | POA: Insufficient documentation

## 2023-05-07 LAB — PULMONARY FUNCTION TEST
DL/VA % pred: 90 %
DL/VA: 3.64 ml/min/mmHg/L
DLCO unc % pred: 82 %
DLCO unc: 20.41 ml/min/mmHg
FEF 25-75 Post: 3.66 L/sec
FEF 25-75 Pre: 3.88 L/sec
FEF2575-%Change-Post: -5 %
FEF2575-%Pred-Post: 160 %
FEF2575-%Pred-Pre: 169 %
FEV1-%Change-Post: -2 %
FEV1-%Pred-Post: 104 %
FEV1-%Pred-Pre: 106 %
FEV1-Post: 3.19 L
FEV1-Pre: 3.25 L
FEV1FVC-%Change-Post: 0 %
FEV1FVC-%Pred-Pre: 113 %
FEV6-%Change-Post: -2 %
FEV6-%Pred-Post: 96 %
FEV6-%Pred-Pre: 98 %
FEV6-Post: 3.79 L
FEV6-Pre: 3.89 L
FEV6FVC-%Change-Post: 0 %
FEV6FVC-%Pred-Post: 106 %
FEV6FVC-%Pred-Pre: 105 %
FVC-%Change-Post: -2 %
FVC-%Pred-Post: 91 %
FVC-%Pred-Pre: 93 %
FVC-Post: 3.84 L
FVC-Pre: 3.92 L
Post FEV1/FVC ratio: 83 %
Post FEV6/FVC ratio: 100 %
Pre FEV1/FVC ratio: 83 %
Pre FEV6/FVC Ratio: 99 %
RV % pred: 84 %
RV: 2.06 L
TLC % pred: 87 %
TLC: 6 L

## 2023-05-07 MED ORDER — ALBUTEROL SULFATE (2.5 MG/3ML) 0.083% IN NEBU
2.5000 mg | INHALATION_SOLUTION | Freq: Once | RESPIRATORY_TRACT | Status: AC
  Administered 2023-05-07: 2.5 mg via RESPIRATORY_TRACT

## 2023-05-09 ENCOUNTER — Other Ambulatory Visit: Payer: Self-pay | Admitting: Internal Medicine

## 2023-05-15 NOTE — Progress Notes (Signed)
Remote ICD transmission.   

## 2023-06-02 ENCOUNTER — Ambulatory Visit (INDEPENDENT_AMBULATORY_CARE_PROVIDER_SITE_OTHER): Payer: Medicare HMO | Admitting: Internal Medicine

## 2023-06-02 ENCOUNTER — Encounter: Payer: Self-pay | Admitting: Internal Medicine

## 2023-06-02 VITALS — BP 122/66 | HR 48 | Temp 98.0°F | Resp 16 | Ht 69.0 in | Wt 146.5 lb

## 2023-06-02 DIAGNOSIS — R1310 Dysphagia, unspecified: Secondary | ICD-10-CM

## 2023-06-02 DIAGNOSIS — E785 Hyperlipidemia, unspecified: Secondary | ICD-10-CM

## 2023-06-02 LAB — LIPID PANEL
Cholesterol: 131 mg/dL (ref 0–200)
HDL: 65.3 mg/dL (ref >39.00)
LDL Cholesterol: 56 mg/dL (ref 0–99)
NonHDL: 65.82
Total CHOL/HDL Ratio: 2
Triglycerides: 51 mg/dL (ref 0.0–149.0)
VLDL: 10.2 mg/dL (ref 0.0–40.0)

## 2023-06-02 NOTE — Assessment & Plan Note (Addendum)
Hyperlipidemia: On Crestor, check FLP Hypothyroidism on Synthroid, last TSH satisfactory. CAD, ischemic cardiomyopathy: Cards OV 04/23/2023: They report he did not tolerate Entresto, off metoprolol, multiple labs done.  Overall patient is stable.  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  Advised about small bites, moist solids with fluids, referred to GI as a first step for evaluation. RTC CPX 4 months

## 2023-06-02 NOTE — Progress Notes (Signed)
Subjective:    Patient ID: Matthew Paris Sr., male    DOB: November 24, 1951, 72 y.o.   MRN: [id number]  DOS:  06/02/2023 Type of visit - description: Follow-up  Chronic medical problems addressed. Has bradycardia, no symptoms related to this issue. He did report dysphagia, located close to the throat, mostly to solids, onset- few years ago after he finished XRT for neck cancer. Symptoms increase if he moist his food however denies dry mouth per se.  Denies chest pain difficulty breathing.  No lower extremity edema or palpitations.  Review of Systems See above   Past Medical History:  Diagnosis Date   AICD (automatic cardioverter/defibrillator) present 6/09   St. Jude   Bradycardia    Use of beta blocker limited   CAD (coronary artery disease)    a- S/p cabg in march 2006 by Dr.Owen;  b. Celine Ahr 5/12: large IL scar from apex to base, no ischemia, EF 37%   Cancer (HCC)    on right side of neck   ED (erectile dysfunction)    Gout    Hyperlipidemia    Ischemic cardiomyopathy    a-Echocardiogram, Nov 2006, showed ejection fraction of 30-40% with mild to moderated mitral  regurgitation and mild aortic regurgitation b- Cardiac MRI, May 2007, EF of 44% with 50% scar involving  the inferolateral walls. No comment of mitral regurgitation. c- last echo  11/10: EF 30-35%  mod MR d- Nega T-Wave alternans testing in May of 2007 e- no ACE due to low BP;  f. CPX 3/11: normal   RBBB (right bundle branch block with left anterior fascicular block)    Systolic CHF, chronic (HCC)    Thyroid disease     Past Surgical History:  Procedure Laterality Date   CARDIAC DEFIBRILLATOR PLACEMENT  06/01/08   ICD   COLONOSCOPY     COLONOSCOPY WITH PROPOFOL N/A 07/28/2019   Procedure: COLONOSCOPY WITH PROPOFOL;  Surgeon: Iva Boop, MD;  Location: [facility] END[facility]COPY;  Service: Endoscopy;  Laterality: N/A;   CORONARY ARTERY BYPASS GRAFT  02-2005   ICD GENERATOR CHANGEOUT N/A 02/03/2017   Procedure: ICD Generator  Changeout;  Surgeon: Marinus Maw, MD;  Location: [facility] INVASIVE CV LAB;  Service: Cardiovascular;  Laterality: N/A;   ORIF ANKLE FRACTURE  1987   left    RIGHT/LEFT HEART CATH AND CORONARY/GRAFT ANGIOGRAPHY N/A 09/20/2019   Procedure: RIGHT/LEFT HEART CATH AND CORONARY/GRAFT ANGIOGRAPHY;  Surgeon: Dolores Patty, MD;  Location: [facility] INVASIVE CV LAB;  Service: Cardiovascular;  Laterality: N/A;   SHOULDER ARTHROSCOPY  02/2010   rt    TEE WITHOUT CARDIOVERSION N/A 10/24/2019   Procedure: TRANSESOPHAGEAL ECHOCARDIOGRAM (TEE);  Surgeon: Dolores Patty, MD;  Location: [facility] ENDOSCOPY;  Service: Cardiovascular;  Laterality: N/A;   TONSILLECTOMY     as a child    Current Outpatient Medications  Medication Instructions   acetaminophen (TYLENOL) 650 mg, Oral, Every 6 hours PRN   amiodarone (PACERONE) 200 mg, Oral, Daily   apixaban (ELIQUIS) 5 MG TABS tablet TAKE 1 TABLET BY MOUTH TWICE A DAY   candesartan (ATACAND) 8 MG tablet TAKE 1/2 TABLET (4 MG TOTAL) BY MOUTH 2 (TWO) TIMES DAILY.   carboxymethylcellul-glycerin (OPTIVE) 0.5-0.9 % ophthalmic solution 1 drop, Both Eyes, Daily   cetirizine (ZYRTEC) 10 mg, Oral, As needed,     Colchicine (MITIGARE) 0.6 mg, Oral, 2 times daily PRN   dapagliflozin propanediol (FARXIGA) 10 mg, Oral, Daily, (Take BEFORE BREAKFAST)   Fish Oil 3,600 mg, Oral,  Daily   furosemide (LASIX) 40 mg, Oral, Daily   levothyroxine (SYNTHROID) 88 mcg, Oral, Daily before breakfast   mexiletine (MEXITIL) 250 mg, Oral, 2 times daily   nitroGLYCERIN (NITROSTAT) 0.4 mg, Sublingual, Every 5 min x3 PRN   rosuvastatin (CRESTOR) 40 mg, Oral, Daily   spironolactone (ALDACTONE) 12.5 mg, Oral, Daily       Objective:   Physical Exam BP 122/66   Pulse (!) 48   Temp 98 F (36.7 C) (Oral)   Resp 16   Ht 5\' 9"  (1.753 m)   Wt 146 lb 8 oz (66.5 kg)   SpO2 99%   BMI 21.63 kg/m  General:   Well developed, NAD, BMI noted. HEENT:  Normocephalic . Face symmetric, atraumatic Lungs:   CTA B Normal respiratory effort, no intercostal retractions, no accessory muscle use. Heart: RRR,  no murmur.  Lower extremities: no pretibial edema bilaterally  Skin: Not pale. Not jaundice Neurologic:  alert & oriented X3.  Speech normal, gait appropriate for age and unassisted Psych--  Cognition and judgment appear intact.  Cooperative with normal attention span and concentration.  Behavior appropriate. No anxious or depressed appearing.      Assessment   Assessment Prediabetes Hyperlipidemia Gout Hypothyroidism-  previously subclinical dz, then rx amidarone 08-2020 and TSH increased to 15 12/2020, started levothyroxine E.D. 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 S/p chemo x 6, XRT x 35; treatment ended 01/2018  PLAN: Hyperlipidemia: On Crestor, check FLP Hypothyroidism on Synthroid, last TSH satisfactory. CAD, ischemic cardiomyopathy: Cards OV 04/23/2023: They report he did not tolerate Entresto, off metoprolol, multiple labs done.  Overall patient is stable.  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  Advised about small bites, moist solids with fluids, referred to GI as a first step for evaluation. RTC CPX 4 months

## 2023-06-02 NOTE — Patient Instructions (Addendum)
Vaccines I recommend; Covid booster RSV vaccine Flu shot this fall  Please bring [place] a copy of your Healthcare Power of Attorney for your chart.      GO TO THE LAB : Get the blood work     GO TO THE FRONT DESK, PLEASE SCHEDULE YOUR APPOINTMENTS Come back for   a physical exam in about 4 months

## 2023-07-01 ENCOUNTER — Ambulatory Visit: Payer: Medicare HMO | Admitting: Internal Medicine

## 2023-07-01 ENCOUNTER — Encounter: Payer: Self-pay | Admitting: Internal Medicine

## 2023-07-01 VITALS — BP 94/56 | HR 42 | Ht 69.0 in | Wt 145.4 lb

## 2023-07-01 DIAGNOSIS — K648 Other hemorrhoids: Secondary | ICD-10-CM | POA: Diagnosis not present

## 2023-07-01 DIAGNOSIS — I480 Paroxysmal atrial fibrillation: Secondary | ICD-10-CM

## 2023-07-01 DIAGNOSIS — Z7901 Long term (current) use of anticoagulants: Secondary | ICD-10-CM

## 2023-07-01 DIAGNOSIS — R1319 Other dysphagia: Secondary | ICD-10-CM | POA: Diagnosis not present

## 2023-07-01 NOTE — Patient Instructions (Signed)
_______________________________________________________  If your blood pressure at your visit was 140/90 or greater, please contact your primary care physician to follow up on this.  _______________________________________________________  If you are age 73 or older, your body mass index should be between 23-30. Your Body mass index is 21.47 kg/m. If this is out of the aforementioned range listed, please consider follow up with your Primary Care Provider.  If you are age 62 or younger, your body mass index should be between 19-25. Your Body mass index is 21.47 kg/m. If this is out of the aformentioned range listed, please consider follow up with your Primary Care Provider.   ________________________________________________________  The [facility] providers would like to encourage you to use [facility] to communicate with providers for non-urgent requests or questions.  Due to long hold times on the telephone, sending your provider a message by [facility] may be a faster and more efficient way to get a response.  Please allow 48 business hours for a response.  Please remember that this is for non-urgent requests.  _______________________________________________________  Matthew Joyce have been scheduled for a Barium Esophogram at [facility] (1st floor of the hospital) on 07/23/23 at 10:00am. Please arrive 30 minutes prior to your appointment for registration. Make certain not to have anything to eat or drink 3 hours prior to your test. If you need to reschedule for any reason, please contact radiology at [phone] to do so. __________________________________________________________________ A barium swallow is an examination that concentrates on views of the esophagus. This tends to be a double contrast exam (barium and two liquids which, when combined, create a gas to distend the wall of the oesophagus) or single contrast (non-ionic iodine based). The study is usually tailored to your symptoms so a  good history is essential. Attention is paid during the study to the form, structure and configuration of the esophagus, looking for functional disorders (such as aspiration, dysphagia, achalasia, motility and reflux) EXAMINATION You may be asked to change into a gown, depending on the type of swallow being performed. A radiologist and radiographer will perform the procedure. The radiologist will advise you of the type of contrast selected for your procedure and direct you during the exam. You will be asked to stand, sit or lie in several different positions and to hold a small amount of fluid in your mouth before being asked to swallow while the imaging is performed .In some instances you may be asked to swallow barium coated marshmallows to assess the motility of a solid food bolus. The exam can be recorded as a digital or video fluoroscopy procedure. POST PROCEDURE It will take 1-2 days for the barium to pass through your system. To facilitate this, it is important, unless otherwise directed, to increase your fluids for the next 24-48hrs and to resume your normal diet.  This test typically takes about 30 minutes to perform. __________________________________________________________________________________  I appreciate the opportunity to care for you. Stan Head, MD, [facility]

## 2023-07-01 NOTE — Progress Notes (Signed)
Matthew Joyce Sr. 72 y.o. [birth year] [id number]  Assessment & Plan:   Encounter Diagnoses  Name Primary?   Esophageal dysphagia Yes   Internal and external prolapsed hemorrhoids    Paroxysmal atrial fibrillation (HCC)    Long term current use of anticoagulant    Patient with chronic dysphagia after radiation and chemotherapy for squamous cell carcinoma of the head and neck from unknown primary.  This may be treatable with esophageal dilation but I wanted to get a barium swallow first.  These types of strictures can be complicated and difficult to deal with and require x-ray fluoroscopy as well.  I had intended to give him a dysphagia 3 diet sheet with his discharge instructions today but I neglected to do so we will get 1 of those to him.  Once I review the barium swallow we decide the next steps.  Given his need for anticoagulation will we need to hold that and clarify with cardiology.  Also his heart failure is severe and ejection fraction is below 35% so he is a high risk candidate for sedation and needs to have procedures at the hospital.  I have also explained the rare but real increased risk of stroke off his anticoagulation.  He understands and accepts these risks.  The risks and benefits as well as alternatives of endoscopic procedure(s) have been discussed and reviewed. All questions answered. The patient agrees to proceed.   I reviewed banding for hemorrhoids, he and I have decided it is not necessary to doing him based upon his symptom profile at this time.  He will monitor and let me know.   I appreciate the opportunity to care for this patient. CC: Wanda Plump, MD   Subjective:   Chief Complaint: Dysphagia and hemorrhoids  HPI 72 year old white man with severe congestive heart failure, history of ventricular tachycardia and AICD placement as well as history of A-fib known to me from prior colonoscopy in 2020, who has had several years of dysphagia.  It began  after radio and chemotherapy for squamous cell carcinoma of the head and neck.  He says his oncologist told him that this was normal but he struggles to swallow solid foods and it has probably gotten some worse.  Weight has been stable over time.  He remains active playing golf several times a week.  Followed by Dr. Gala Romney of cardiology.  He had grade 2 internal hemorrhoids at colonoscopy in 2020 and is wondering about banding.  That procedure was done for heme positive stool.  Other than occasional itching he does not have prolapse or bleeding symptoms. Allergies  Allergen Reactions   Atorvastatin      myalgia, Muscle aching   Entresto [Sacubitril-Valsartan] Itching    Had itching and redness   Sulfonamide Derivatives     Rash and hot flashes   Sulfamethoxazole Rash and Other (See Comments)    Hot flashes   Tape Other (See Comments)    Rips skin- use PAPER tape    Current Outpatient Medications:    acetaminophen (TYLENOL) 325 MG tablet, Take 650 mg by mouth every 6 (six) hours as needed (for pain.)., Disp: , Rfl:    amiodarone (PACERONE) 200 MG tablet, Take 1 tablet (200 mg total) by mouth daily., Disp: 90 tablet, Rfl: 3   apixaban (ELIQUIS) 5 MG TABS tablet, TAKE 1 TABLET BY MOUTH TWICE A DAY, Disp: 180 tablet, Rfl: 1   candesartan (ATACAND) 8 MG tablet, TAKE 1/2 TABLET (4 MG TOTAL) BY  MOUTH 2 (TWO) TIMES DAILY., Disp: 90 tablet, Rfl: 3   carboxymethylcellul-glycerin (OPTIVE) 0.5-0.9 % ophthalmic solution, Place 1 drop into both eyes daily., Disp: , Rfl:    cetirizine (ZYRTEC) 10 MG tablet, Take 10 mg by mouth as needed for allergies., Disp: , Rfl:    Colchicine (MITIGARE) 0.6 MG CAPS, Take 0.6 mg by mouth 2 (two) times daily as needed (gout flare)., Disp: 60 capsule, Rfl: 0   dapagliflozin propanediol (FARXIGA) 10 MG TABS tablet, Take 1 tablet (10 mg total) by mouth daily. (Take BEFORE BREAKFAST), Disp: 90 tablet, Rfl: 3   furosemide (LASIX) 40 MG tablet, TAKE 1 TABLET BY MOUTH EVERY  DAY (Patient taking differently: Take 40 mg by mouth every other day.), Disp: 90 tablet, Rfl: 3   levothyroxine (SYNTHROID) 88 MCG tablet, Take 1 tablet (88 mcg total) by mouth daily before breakfast., Disp: 90 tablet, Rfl: 1   mexiletine (MEXITIL) 250 MG capsule, TAKE 1 CAPSULE BY MOUTH TWICE A DAY, Disp: 180 capsule, Rfl: 1   nitroGLYCERIN (NITROSTAT) 0.4 MG SL tablet, Place 1 tablet (0.4 mg total) under the tongue every 5 (five) minutes x 3 doses as needed for chest pain., Disp: 25 tablet, Rfl: 3   Omega-3 Fatty Acids (FISH OIL) 1200 MG CAPS, Take 3,600 mg by mouth daily., Disp: , Rfl:    rosuvastatin (CRESTOR) 40 MG tablet, Take 1 tablet (40 mg total) by mouth daily., Disp: 90 tablet, Rfl: 2   spironolactone (ALDACTONE) 25 MG tablet, Take 0.5 tablets (12.5 mg total) by mouth daily., Disp: 45 tablet, Rfl: 3  Past Medical History:  Diagnosis Date   AICD (automatic cardioverter/defibrillator) present 05/2008   [employer]   Bradycardia    Use of beta blocker limited   CAD (coronary artery disease)    a- S/p cabg in march 2006 by Dr.Owen;  b. Celine Ahr 5/12: large IL scar from apex to base, no ischemia, EF 37%   Cancer (HCC)    on right side of neck   ED (erectile dysfunction)    Gout    Hyperlipidemia    Ischemic cardiomyopathy    a-Echocardiogram, Nov 2006, showed ejection fraction of 30-40% with mild to moderated mitral  regurgitation and mild aortic regurgitation b- Cardiac MRI, May 2007, EF of 44% with 50% scar involving  the inferolateral walls. No comment of mitral regurgitation. c- last echo  11/10: EF 30-35%  mod MR d- Nega T-Wave alternans testing in May of 2007 e- no ACE due to low BP;  f. CPX 3/11: normal   RBBB (right bundle branch block with left anterior fascicular block)    Systolic CHF, chronic (HCC)    Thyroid disease    Past Surgical History:  Procedure Laterality Date   CARDIAC DEFIBRILLATOR PLACEMENT  06/01/08   ICD   COLONOSCOPY     COLONOSCOPY WITH PROPOFOL N/A  07/28/2019   Procedure: COLONOSCOPY WITH PROPOFOL;  Surgeon: Iva Boop, MD;  Location: [facility] ENDOSCOPY;  Service: Endoscopy;  Laterality: N/A;   CORONARY ARTERY BYPASS GRAFT  02-2005   ICD GENERATOR CHANGEOUT N/A 02/03/2017   Procedure: ICD Generator Changeout;  Surgeon: Marinus Maw, MD;  Location: [facility] INVASIVE CV LAB;  Service: Cardiovascular;  Laterality: N/A;   ORIF ANKLE FRACTURE  1987   left    RIGHT/LEFT HEART CATH AND CORONARY/GRAFT ANGIOGRAPHY N/A 09/20/2019   Procedure: RIGHT/LEFT HEART CATH AND CORONARY/GRAFT ANGIOGRAPHY;  Surgeon: Dolores Patty, MD;  Location: [facility] INVASIVE CV LAB;  Service: Cardiovascular;  Laterality:  N/A;   SHOULDER ARTHROSCOPY  02/2010   rt    TEE WITHOUT CARDIOVERSION N/A 10/24/2019   Procedure: TRANSESOPHAGEAL ECHOCARDIOGRAM (TEE);  Surgeon: Dolores Patty, MD;  Location: [facility] ENDOSCOPY;  Service: Cardiovascular;  Laterality: N/A;   TONSILLECTOMY     as a child   Social History   Social History Narrative   Retired    household- pt and wife   2 adult children in [place] also has grandchildren   Former but not current smoker, occasional alcohol no drug use   family history includes Cardiomyopathy in his brother; Heart attack (age of onset: 80) in his father.   Review of Systems As per HPI otherwise negative  Objective:   Physical Exam @BP  (!) 94/56   Pulse (!) 42   Ht 5\' 9"  (1.753 m)   Wt 145 lb 6 oz (65.9 kg)   BMI 21.47 kg/m @  General:  Well-developed, well-nourished and in no acute distress Eyes:  anicteric. ENT:   Mouth and posterior pharynx free of lesions.  Neck:   supple w/o thyromegaly or mass. Some thickening sq on right Lungs: Clear to auscultation bilaterally. Heart:   S1S2, no rubs, murmurs, gallops. Abdomen:  soft, non-tender, no hepatosplenomegaly, hernia, or mass and BS+.  Lymph:  no cervical or supraclavicular adenopathy. Extremities:   no edema, cyanosis or clubbing Neuro:  A&O x 3.  Psych:  appropriate mood and   Affect.   Data Reviewed: I have reviewed oncology notes in care everywhere, previous imaging, labs from May 2024.  Cardiology note from Apr 23, 2023 also reviewed.

## 2023-07-02 ENCOUNTER — Encounter: Payer: Self-pay | Admitting: Internal Medicine

## 2023-07-23 ENCOUNTER — Ambulatory Visit (HOSPITAL_COMMUNITY)
Admission: RE | Admit: 2023-07-23 | Discharge: 2023-07-23 | Disposition: A | Discharge status: Home / Self Care | Payer: Medicare HMO | Source: Ambulatory Visit | Attending: Internal Medicine | Admitting: Internal Medicine

## 2023-07-23 DIAGNOSIS — R1319 Other dysphagia: Secondary | ICD-10-CM | POA: Diagnosis not present

## 2023-07-23 DIAGNOSIS — K449 Diaphragmatic hernia without obstruction or gangrene: Secondary | ICD-10-CM | POA: Diagnosis not present

## 2023-07-23 DIAGNOSIS — R1310 Dysphagia, unspecified: Secondary | ICD-10-CM | POA: Diagnosis not present

## 2023-07-27 ENCOUNTER — Other Ambulatory Visit: Payer: Self-pay

## 2023-07-27 DIAGNOSIS — R1319 Other dysphagia: Secondary | ICD-10-CM

## 2023-07-28 ENCOUNTER — Telehealth (HOSPITAL_COMMUNITY): Payer: Self-pay | Admitting: *Deleted

## 2023-07-28 ENCOUNTER — Other Ambulatory Visit (HOSPITAL_COMMUNITY): Payer: Self-pay | Admitting: *Deleted

## 2023-07-28 ENCOUNTER — Ambulatory Visit: Payer: Medicare HMO

## 2023-07-28 DIAGNOSIS — I429 Cardiomyopathy, unspecified: Secondary | ICD-10-CM | POA: Diagnosis not present

## 2023-07-28 DIAGNOSIS — R1310 Dysphagia, unspecified: Secondary | ICD-10-CM

## 2023-07-28 LAB — CUP PACEART REMOTE DEVICE CHECK
Battery Remaining Longevity: 41 mo
Battery Remaining Percentage: 47 %
Battery Voltage: 2.93 V
Brady Statistic RV Percent Paced: 1.5 %
Date Time Interrogation Session: 20240813040018
HighPow Impedance: 53 Ohm
HighPow Impedance: 53 Ohm
Implantable Lead Connection Status: 753985
Implantable Lead Implant Date: 20090618
Implantable Lead Location: 753860
Implantable Lead Model: 7121
Implantable Pulse Generator Implant Date: 20180220
Lead Channel Impedance Value: 440 Ohm
Lead Channel Pacing Threshold Amplitude: 1.25 V
Lead Channel Pacing Threshold Pulse Width: 1 ms
Lead Channel Sensing Intrinsic Amplitude: 3.9 mV
Lead Channel Setting Pacing Amplitude: 4 V
Lead Channel Setting Pacing Pulse Width: 1 ms
Lead Channel Setting Sensing Sensitivity: 0.5 mV
Pulse Gen Serial Number: 7406967

## 2023-07-28 NOTE — Telephone Encounter (Signed)
Attempted to contact patient to schedule OP MBS. Left VM. RKEEL 

## 2023-08-01 ENCOUNTER — Other Ambulatory Visit: Payer: Self-pay | Admitting: Internal Medicine

## 2023-08-05 ENCOUNTER — Ambulatory Visit (HOSPITAL_COMMUNITY)
Admission: RE | Admit: 2023-08-05 | Discharge: 2023-08-05 | Disposition: A | Discharge status: Home / Self Care | Payer: Medicare HMO | Source: Ambulatory Visit | Attending: Internal Medicine | Admitting: Internal Medicine

## 2023-08-05 DIAGNOSIS — Z923 Personal history of irradiation: Secondary | ICD-10-CM | POA: Insufficient documentation

## 2023-08-05 DIAGNOSIS — R638 Other symptoms and signs concerning food and fluid intake: Secondary | ICD-10-CM | POA: Diagnosis not present

## 2023-08-05 DIAGNOSIS — R634 Abnormal weight loss: Secondary | ICD-10-CM | POA: Insufficient documentation

## 2023-08-05 DIAGNOSIS — R1310 Dysphagia, unspecified: Secondary | ICD-10-CM

## 2023-08-05 DIAGNOSIS — K449 Diaphragmatic hernia without obstruction or gangrene: Secondary | ICD-10-CM | POA: Diagnosis not present

## 2023-08-05 DIAGNOSIS — W44F3XA Food entering into or through a natural orifice, initial encounter: Secondary | ICD-10-CM | POA: Insufficient documentation

## 2023-08-05 DIAGNOSIS — R1319 Other dysphagia: Secondary | ICD-10-CM

## 2023-08-05 NOTE — Progress Notes (Signed)
Modified Barium Swallow Study  Patient Details  Name: Matthew BAFFORD Sr. MRN: [id number] Date of Birth: 11-01-1951  Today's Date: 08/05/2023  Modified Barium Swallow completed.  Full report located under Chart Review in the Imaging Section.  History of Present Illness Pt is a 72 year old man arriving for an OP MBS who had Esophagram 8/8 - No definite mass or stricture is noted in the esophagus. Small  sliding-type hiatal hernia is noted. No definite reflux is noted.  Barium tablet passed through esophagus and into stomach without  significant difficulty or delay. Pt referred by GI due to following report "several years of dysphagia.  It began after radio and chemotherapy for squamous cell carcinoma of the head and neck.  He says his oncologist told him that this was normal but he struggles to swallow solid foods and it has probably gotten some worse."   Clinical Impression Pt demonstrates a moderate oropharyngeal dyspahgia likely related to late effects of radiation induced fibrosis to muscles of swallowing. Pt observed to have decreased hyoid excursion and epiglottic deflection primarily. Pt has piecemeal swallowing of liquids with good ability to sustain laryngeal elevationa nd vestibular closure with multiple propulsive efforts to push past vallecular space. There is base of tongue retration. and pharyngeal contraction, but they could be stronger to overcome stiffness in pharyngeal structures. Most residue accumulates the in the valleculae; pt uses a liquid rinse with solids and there is trace silent aspiration of thin residue throughout. Pt sensed residue, but not aspiration. Best methods were a chin tuck with solids and liquids which incraesed epigltotic defection. Also intermittent throat clearing. Pt recommended to continue a soft/moist regular diet and thin liquids but with added f/u with OP SLP to initiate a Home exercise program and train chin tuck and throat clearing. Pt also recommended to  use strict oral hygiene to reduce bacterial load. Pt at risk of aspiration into the future; mobility is helpful and swallowing therapy may help pt maintain function. Factors that may increase risk of adverse event in presence of aspiration Matthew Joyce & Matthew Joyce 2021): Frequent aspiration of large volumes;Aspiration of thick, dense, and/or acidic materials  Swallow Evaluation Recommendations Recommendations: PO diet PO Diet Recommendation: Dysphagia 3 (Mechanical soft);Thin liquids (Level 0);Regular Liquid Administration via: Cup;Straw Medication Administration: Crushed with puree Swallowing strategies  : Chin tuck;Clear throat intermittently Postural changes: Position pt fully upright for meals Oral care recommendations: Oral care QID (4x/day)      Matthew Joyce, Matthew Joyce 08/05/2023,12:50 PM

## 2023-08-06 ENCOUNTER — Other Ambulatory Visit: Payer: Medicare HMO | Admitting: Pharmacist

## 2023-08-06 NOTE — Progress Notes (Signed)
Pharmacy Note  08/06/2023 Name: Matthew HENSEY Sr. MRN: [id number] DOB: 1951-09-13  Subjective: Matthew Paris Sr. is a 72 y.o. year old male who is a primary care patient of Wanda Plump, MD. Clinical Pharmacist Practitioner referral was placed to assist with medication management.    Engaged with patient by telephone for follow up visit today.  Hypertension / CHF Current therapy candasartan 8mg  daily, spironolacton 25mg  0.5 tablet = 12.5mg  daily, furosemide 40mg  every OTHER day and Farxiga 10mg  daily.   Reviewed refill history -  filled candasartan 8mg  for #90 tabs 07/25/2023  Had follow up with Dr Gala Romney 04/23/2023 ECHO 04/23/2023 show stable EF of 25-30%  Patient state he feels wells. He is golfing several times per week and walking for exercise.   Afib:  Rate controlling medication -  amiodarone 200mg  daily LR was 3/30/204 for #90 but patient was taking only every OTHER day in past so he has extra tablets on hand. No refill need currently.  Mexiletine 250mg  twice a day - LR was 05/12/2023 - 90 DS. Patient states he has a few tablets on hand but not sure why it has not been automatically refilled by his pharmacy.   Anticoagulation: Eliquis 5mg  twice a day - patient has just reached coverage gap. Has not spent enough out of pocket for 2024 yet to apply for medication assistance program.   Hyperlipidemia:  Last LDL was at goal of < 55 Taking rosuvastatin 40mg  daily - LR for 90 DS was 07/31/2023   Objective: Review of patient status, including review of consultants reports, laboratory and other test data, was performed as part of comprehensive evaluation and provision of chronic care management services.   Lab Results  Component Value Date   CREATININE 1.06 04/23/2023   CREATININE 0.97 02/26/2023   CREATININE 1.03 11/25/2022    Lab Results  Component Value Date   HGBA1C 6.1 02/26/2023       Component Value Date/Time   CHOL 131 06/02/2023 0842   TRIG 51.0  06/02/2023 0842   TRIG 45 12/16/2006 0912   HDL 65.30 06/02/2023 0842   CHOLHDL 2 06/02/2023 0842   VLDL 10.2 06/02/2023 0842   LDLCALC 56 06/02/2023 0842     Clinical ASCVD: Yes  The 10-year ASCVD risk score (Arnett DK, et al., 2019) is: 10.2%   Values used to calculate the score:     Age: 60 years     Sex: Male     Is Non-Hispanic African American: No     Diabetic: No     Tobacco smoker: No     Systolic Blood Pressure: 94 mmHg     Is BP treated: No     HDL Cholesterol: 65.3 mg/dL     Total Cholesterol: 131 mg/dL    BP Readings from Last 3 Encounters:  07/01/23 (!) 94/56  06/02/23 122/66  04/23/23 (!) 92/50     Allergies  Allergen Reactions   Atorvastatin      myalgia, Muscle aching   Entresto [Sacubitril-Valsartan] Itching    Had itching and redness   Sulfonamide Derivatives     Rash and hot flashes   Sulfamethoxazole Rash and Other (See Comments)    Hot flashes   Tape Other (See Comments)    Rips skin- use PAPER tape    Medications Reviewed Today     Reviewed by Henrene Pastor, RPH-CPP (Pharmacist) on 08/06/23 at 1522  Med List Status: <None>   Medication Order Taking? Sig Documenting Provider  Last Dose Status Informant  acetaminophen (TYLENOL) 325 MG tablet [id number] Yes Take 650 mg by mouth every 6 (six) hours as needed (for pain.). [provider] Taking Active Self           Med Note Roetta Sessions, LENA B   Wed Oct 12, 2019  2:03 PM)    amiodarone (PACERONE) 200 MG tablet [id number] Yes Take 1 tablet (200 mg total) by mouth daily. Marinus Maw, MD Taking Active   apixaban [facility]) 5 MG TABS tablet [id number] Yes TAKE 1 TABLET BY MOUTH TWICE A DAY Marinus Maw, MD Taking Active   candesartan (ATACAND) 8 MG tablet [id number] Yes TAKE 1/2 TABLET (4 MG TOTAL) BY MOUTH 2 (TWO) TIMES DAILY. Bensimhon, Bevelyn Buckles, MD Taking Active   carboxymethylcellul-glycerin (OPTIVE) 0.5-0.9 % ophthalmic solution [id number] Yes Place 1 drop into both eyes daily.  [provider] Taking Active Self  cetirizine (ZYRTEC) 10 MG tablet [id number] Yes Take 10 mg by mouth as needed for allergies. [provider] Taking Active Self  Colchicine (MITIGARE) 0.6 MG CAPS [id number] Yes Take 0.6 mg by mouth 2 (two) times daily as needed (gout flare). Wanda Plump, MD Taking Active            Med Note [facility], Vicente Males D   Tue Jun 02, 2023  7:57 AM) prn  dapagliflozin propanediol (FARXIGA) 10 MG TABS tablet [id number] Yes Take 1 tablet (10 mg total) by mouth daily. (Take BEFORE BREAKFAST) Wanda Plump, MD Taking Active   furosemide (LASIX) 40 MG tablet [id number] Yes TAKE 1 TABLET BY MOUTH EVERY DAY  Patient taking differently: Take 40 mg by mouth every other day.   Bensimhon, Bevelyn Buckles, MD Taking Active   levothyroxine (SYNTHROID) 88 MCG tablet [id number] Yes Take 1 tablet (88 mcg total) by mouth daily before breakfast. Wanda Plump, MD Taking Active   mexiletine (MEXITIL) 250 MG capsule [id number] Yes TAKE 1 CAPSULE BY MOUTH TWICE A DAY Bensimhon, Bevelyn Buckles, MD Taking Active   nitroGLYCERIN (NITROSTAT) 0.4 MG SL tablet [id number] Yes Place 1 tablet (0.4 mg total) under the tongue every 5 (five) minutes x 3 doses as needed for chest pain. Wanda Plump, MD Taking Active            Med Note [facility], Paula Libra   Tue Jun 02, 2023  7:57 AM) PRN  Omega-3 Fatty Acids (FISH OIL) 1200 MG CAPS [id number] Yes Take 3,600 mg by mouth daily. [provider] Taking Active Self  rosuvastatin (CRESTOR) 40 MG tablet [id number] Yes Take 1 tablet (40 mg total) by mouth daily. Wanda Plump, MD Taking Active   spironolactone (ALDACTONE) 25 MG tablet [id number] Yes Take 0.5 tablets (12.5 mg total) by mouth daily. 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 (HCC) 11/12/2017   PCP NOTES >>>>>>>>>>>>>>>>>>>>> 04/21/2016   Seborrheic dermatitis of scalp 04/11/2013   Annual physical exam 04/11/2011   MITRAL REGURGITATION 04/10/2010   SYSTOLIC HEART FAILURE, CHRONIC  10/03/2009   Hyperglycemia 06/19/2008   Coronary atherosclerosis 06/14/2008   Automatic implantable cardioverter-defibrillator in situ 06/01/2008   ERECTILE DYSFUNCTION 12/17/2007   Hyperlipidemia 02/06/2007   Gout 02/06/2007   RBBB 02/06/2007   Congestive heart failure (HCC) 02/06/2007   CORONARY ARTERY BYPASS GRAFT, FOUR VESSEL, HX OF 02/12/2005     Medication Assistance:   Marcelline Deist obtained through [facility]  medication assistance program.  Enrollment ends 12/15/2023  Patient has not spend 3% out of pocket for 2024 so unable to apply for Eliquis medication assistance program yet.    Assessment / Plan: Hyperlipidemia - LDL goal < 55 - at goal  Continue rosuvastatin daily - [facility]  HFrEF - stable Continue Farxiga 10mg  daily, spironolactone 25mg  daily and candesartan 8mg  daily and furosemide 40mg  every OTHER day  Afib Continue amiodarone, mexiletine 250mg  and Eliquis.  Monitoring out of pocket spend to see if can apply for Eliquis medication assistance program later in 2024.   Coordinate refill for [facility]iletine with [facility].   Follow Up:  Telephone follow up appointment with care management team member scheduled for:  3-4 months Will call to check out of pocket spend for 2024 again next month   Henrene Pastor, PharmD Clinical Pharmacist [facility]  - [facility] [phone]

## 2023-08-10 ENCOUNTER — Encounter: Payer: Self-pay | Admitting: Internal Medicine

## 2023-08-10 ENCOUNTER — Ambulatory Visit: Payer: Medicare HMO | Attending: Internal Medicine | Admitting: Internal Medicine

## 2023-08-10 VITALS — BP 128/66 | HR 49 | Ht 69.0 in | Wt 148.0 lb

## 2023-08-10 DIAGNOSIS — I472 Ventricular tachycardia, unspecified: Secondary | ICD-10-CM

## 2023-08-10 NOTE — Patient Instructions (Signed)

## 2023-08-10 NOTE — Progress Notes (Signed)
HPI Matthew Joyce today for ongoing evaluation of ventricular tachycardia.  He is a very pleasant 72 year old man with an ischemic cardiomyopathy status post bypass surgery over 16 years ago.  His ejection fraction has been between 30 and 35%.  He is status post ICD implantation.  He has a history of sinus node dysfunction and right bundle branch block.  He has been intolerant to beta-blockers in the past.  The patient has had recurrent episodes of ventricular tachycardia and has been placed on Pacerone as well as mexiletine.  He feels better with no recurrent arrhythmias recently and no problems tolerating his medications.  He has developed bradycardia but is asymptomatic.  He is able to play golf and walk.  He has class II heart failure symptoms.  Allergies  Allergen Reactions   Atorvastatin      myalgia, Muscle aching   Entresto [Sacubitril-Valsartan] Itching    Had itching and redness   Sulfonamide Derivatives     Rash and hot flashes   Sulfamethoxazole Rash and Other (See Comments)    Hot flashes   Tape Other (See Comments)    Rips skin- use PAPER tape     Current Outpatient Medications  Medication Sig Dispense Refill   acetaminophen (TYLENOL) 325 MG tablet Take 650 mg by mouth every 6 (six) hours as needed (for pain.).     amiodarone (PACERONE) 200 MG tablet Take 1 tablet (200 mg total) by mouth daily. 90 tablet 3   apixaban (ELIQUIS) 5 MG TABS tablet TAKE 1 TABLET BY MOUTH TWICE A DAY 180 tablet 1   candesartan (ATACAND) 8 MG tablet TAKE 1/2 TABLET (4 MG TOTAL) BY MOUTH 2 (TWO) TIMES DAILY. 90 tablet 3   carboxymethylcellul-glycerin (OPTIVE) 0.5-0.9 % ophthalmic solution Place 1 drop into both eyes daily.     cetirizine (ZYRTEC) 10 MG tablet Take 10 mg by mouth as needed for allergies.     Colchicine (MITIGARE) 0.6 MG CAPS Take 0.6 mg by mouth 2 (two) times daily as needed (gout flare). 60 capsule 0   dapagliflozin propanediol (FARXIGA) 10 MG TABS tablet Take 1 tablet (10 mg  total) by mouth daily. (Take BEFORE BREAKFAST) 90 tablet 3   furosemide (LASIX) 40 MG tablet TAKE 1 TABLET BY MOUTH EVERY DAY (Patient taking differently: Take 40 mg by mouth every other day.) 90 tablet 3   levothyroxine (SYNTHROID) 88 MCG tablet Take 1 tablet (88 mcg total) by mouth daily before breakfast. 90 tablet 1   mexiletine (MEXITIL) 250 MG capsule TAKE 1 CAPSULE BY MOUTH TWICE A DAY 180 capsule 1   nitroGLYCERIN (NITROSTAT) 0.4 MG SL tablet Place 1 tablet (0.4 mg total) under the tongue every 5 (five) minutes x 3 doses as needed for chest pain. 25 tablet 3   Omega-3 Fatty Acids (FISH OIL) 1200 MG CAPS Take 3,600 mg by mouth daily.     rosuvastatin (CRESTOR) 40 MG tablet Take 1 tablet (40 mg total) by mouth daily. 90 tablet 2   spironolactone (ALDACTONE) 25 MG tablet Take 0.5 tablets (12.5 mg total) by mouth daily. 45 tablet 3   No current facility-administered medications for this visit.     Past Medical History:  Diagnosis Date   AICD (automatic cardioverter/defibrillator) present 05/2008   St. Jude   Bradycardia    Use of beta blocker limited   CAD (coronary artery disease)    a- S/p cabg in march 2006 by Dr.Owen;  b. myoview 5/12: large IL scar from  apex to base, no ischemia, EF 37%   Cancer (HCC)    on right side of neck   ED (erectile dysfunction)    Gout    Hyperlipidemia    Ischemic cardiomyopathy    a-Echocardiogram, Nov 2006, showed ejection fraction of 30-40% with mild to moderated mitral  regurgitation and mild aortic regurgitation b- Cardiac MRI, May 2007, EF of 44% with 50% scar involving  the inferolateral walls. No comment of mitral regurgitation. c- last echo  11/10: EF 30-35%  mod MR d- Nega T-Wave alternans testing in May of 2007 e- no ACE due to low BP;  f. CPX 3/11: normal   RBBB (right bundle branch block with left anterior fascicular block)    Systolic CHF, chronic (HCC)    Thyroid disease     ROS:   All systems reviewed and negative except as noted  in the HPI.   Past Surgical History:  Procedure Laterality Date   CARDIAC DEFIBRILLATOR PLACEMENT  06/01/08   ICD   COLONOSCOPY     COLONOSCOPY WITH PROPOFOL N/A 07/28/2019   Procedure: COLONOSCOPY WITH PROPOFOL;  Surgeon: Iva Boop, MD;  Location: [facility];  Service: Endoscopy;  Laterality: N/A;   CORONARY ARTERY BYPASS GRAFT  02-2005   ICD GENERATOR CHANGEOUT N/A 02/03/2017   Procedure: ICD Generator Changeout;  Surgeon: Marinus Maw, MD;  Location: [facility] INVASIVE CV LAB;  Service: Cardiovascular;  Laterality: N/A;   ORIF ANKLE FRACTURE  1987   left    RIGHT/LEFT HEART CATH AND CORONARY/GRAFT ANGIOGRAPHY N/A 09/20/2019   Procedure: RIGHT/LEFT HEART CATH AND CORONARY/GRAFT ANGIOGRAPHY;  Surgeon: Dolores Patty, MD;  Location: [facility] INVASIVE CV LAB;  Service: Cardiovascular;  Laterality: N/A;   SHOULDER ARTHROSCOPY  02/2010   rt    TEE WITHOUT CARDIOVERSION N/A 10/24/2019   Procedure: TRANSESOPHAGEAL ECHOCARDIOGRAM (TEE);  Surgeon: Dolores Patty, MD;  [facility] ENDOSCOPY;  Service: Cardiovascular;  Laterality: N/A;   TONSILLECTOMY     as a child     Family History  Problem Relation Age of Onset   Heart attack Father 11   Cardiomyopathy Brother    Colon cancer Neg Hx    Prostate cancer Neg Hx    Diabetes Neg Hx      Social History   Socioeconomic History   Marital status: Married    Spouse name: Not on file   Number of children: 2   Years of education: Not on file   Highest education level: Bachelor's degree (e.g., BA, AB, BS)  Occupational History   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     Employer: [employer]  Tobacco Use   Smoking status: Former    Current packs/day: 0.00    Types: Cigarettes    Quit date: 12/16/1999    Years since quitting: 23.6   Smokeless tobacco: Never  Vaping Use   Vaping status: Never Used  Substance and Sexual Activity   Alcohol use: Not Currently   Drug use: No    Sexual activity: Not on file  Other Topics Concern   Not on file  Social History Narrative   Retired    household- pt and wife   2 adult children in [place] also has grandchildren   Former but not current smoker, occasional alcohol no drug use   Social Determinants of Corporate investment banker Strain: Low Risk  (05/26/2023)   Overall Financial Resource Strain (CARDIA)    Difficulty of Paying  Living Expenses: Not hard at all  Food Insecurity: No Food Insecurity (05/26/2023)   Hunger Vital Sign    Worried About Running Out of Food in the Last Year: Never true    Ran Out of Food in the Last Year: Never true  Transportation Needs: No Transportation Needs (05/26/2023)   PRAPARE - Administrator, Civil Service (Medical): No    Lack of Transportation (Non-Medical): No  Physical Activity: Sufficiently Active (05/26/2023)   Exercise Vital Sign    Days of Exercise per Week: 6 days    Minutes of Exercise per Session: 50 min  Stress: No Stress Concern Present (05/26/2023)   [employer] of Occupational Health - Occupational Stress Questionnaire    Feeling of Stress : Not at all  Social Connections: Socially Integrated (05/26/2023)   Social Connection and Isolation Panel [NHANES]    Frequency of Communication with Friends and Family: More than three times a week    Frequency of Social Gatherings with Friends and Family: More than three times a week    Attends Religious Services: More than 4 times per year    Active Member of [employer] or Organizations: Yes    Attends Banker Meetings: More than 4 times per year    Marital Status: Married  Catering manager Violence: Unknown (03/18/2022)   Received from [facility]   HITS    Physically Hurt: Not on file    Insult or Talk Down To: Not on file    Threaten Physical Harm: Not on file    Scream or Curse: Not on file     BP 128/66   Pulse (!) 49   Ht 5\' 9"  (1.753 m)   Wt 148 lb (67.1 kg)   SpO2 99%   BMI 21.86 kg/m    Physical Exam:  Well appearing NAD HEENT: Unremarkable Neck:  No JVD, no thyromegally Lymphatics:  No adenopathy Back:  No CVA tenderness Lungs:  Clear with no wheezes HEART:  Regular rate rhythm, no murmurs, no rubs, no clicks Abd:  soft, positive bowel sounds, no organomegally, no rebound, no guarding Ext:  2 plus pulses, no edema, no cyanosis, no clubbing Skin:  No rashes no nodules Neuro:  CN II through XII intact, motor grossly intact  EKG - sinus brady with RBBB  DEVICE  Normal device function.  See PaceArt for details.   Assess/Plan:  1.  Recurrent ventricular tachycardia -his ventricular tachycardia appears to be fairly well controlled on a combination of amiodarone and mexiletine.  He will undergo watchful waiting.  For now I would not recommend VT ablation unless his ventricular tachycardia becomes incessant and slower on medical therapy. 2.  Chronic systolic heart failure - his heart failure symptoms are class II.  He will continue his current medical therapy.  He is encouraged to maintain a low-sodium diet. 3.  Coronary artery disease -he is status post bypass grafting and has no anginal symptoms. 4.  Paroxysmal atrial fibrillation -the patient is maintaining sinus rhythm.  No change in medications.   Lewayne Fulmore, MD

## 2023-08-12 NOTE — Progress Notes (Signed)
Remote ICD transmission.   

## 2023-08-13 ENCOUNTER — Telehealth: Payer: Self-pay | Admitting: Internal Medicine

## 2023-08-13 NOTE — Telephone Encounter (Signed)
Patient called to follow up on a referral for speech therapist. Please advise stated he has not heard from anyone yet about it.

## 2023-08-13 NOTE — Telephone Encounter (Signed)
Kendal Hymen,  Would this man benefit from speech therapy follow-up?  Thanks  CEG

## 2023-08-13 NOTE — Telephone Encounter (Signed)
Spoke with patient & he stated after his MBSS that he was advised that a referral would be placed to speech therapy. I do see where results are uploaded & will let Dr. Leone Payor know to advise on next steps. Pt is aware we will contact him once reviewed.

## 2023-08-20 ENCOUNTER — Ambulatory Visit (HOSPITAL_COMMUNITY)
Admission: RE | Admit: 2023-08-20 | Discharge: 2023-08-20 | Disposition: A | Discharge status: Home / Self Care | Payer: Medicare HMO | Source: Ambulatory Visit | Attending: Internal Medicine | Admitting: Internal Medicine

## 2023-08-20 DIAGNOSIS — R911 Solitary pulmonary nodule: Secondary | ICD-10-CM | POA: Insufficient documentation

## 2023-08-20 DIAGNOSIS — R918 Other nonspecific abnormal finding of lung field: Secondary | ICD-10-CM | POA: Diagnosis not present

## 2023-08-20 DIAGNOSIS — I700 Atherosclerosis of aorta: Secondary | ICD-10-CM | POA: Diagnosis not present

## 2023-08-20 DIAGNOSIS — J984 Other disorders of lung: Secondary | ICD-10-CM | POA: Diagnosis not present

## 2023-08-21 ENCOUNTER — Ambulatory Visit: Payer: Medicare HMO | Admitting: Physician Assistant

## 2023-08-31 ENCOUNTER — Telehealth: Payer: Self-pay | Admitting: Pharmacist

## 2023-08-31 ENCOUNTER — Telehealth (HOSPITAL_COMMUNITY): Payer: Self-pay

## 2023-08-31 MED ORDER — DAPAGLIFLOZIN PROPANEDIOL 10 MG PO TABS: 10.0000 mg | ORAL_TABLET | Freq: Every day | ORAL | 3 refills | Status: DC

## 2023-08-31 NOTE — Telephone Encounter (Signed)
Please see notes below and advise

## 2023-08-31 NOTE — Telephone Encounter (Signed)
Patient called about CT results. Can you please annotate on them.

## 2023-08-31 NOTE — Telephone Encounter (Signed)
Patient called. He received message from [facility] and [facility] about reapplying for 2025. He completed on line renewal but he got another text today.  It appears that [place] and [place] needs updated Rx for Farxiga. Sent to [facility] for processing .

## 2023-08-31 NOTE — Telephone Encounter (Signed)
Patient called stated he is still waiting on the referral for SLP services.

## 2023-09-01 ENCOUNTER — Other Ambulatory Visit: Payer: Self-pay

## 2023-09-01 DIAGNOSIS — R1319 Other dysphagia: Secondary | ICD-10-CM

## 2023-09-01 NOTE — Telephone Encounter (Signed)
Please refer to speech pathology for therapy of dysphagia

## 2023-09-02 ENCOUNTER — Other Ambulatory Visit: Payer: Self-pay

## 2023-09-02 DIAGNOSIS — R1319 Other dysphagia: Secondary | ICD-10-CM

## 2023-09-02 NOTE — Telephone Encounter (Signed)
Referral to speech placed in Epic.  Pt made aware. Pt notified that their office will contact him and if he has not heard anything from their office within 2 weeks then to give our office a call back to check on the status of the referral.  Pt verbalized understanding with all questions answered.

## 2023-09-08 ENCOUNTER — Ambulatory Visit: Payer: Medicare HMO | Attending: Internal Medicine

## 2023-09-08 DIAGNOSIS — R1319 Other dysphagia: Secondary | ICD-10-CM | POA: Diagnosis not present

## 2023-09-08 DIAGNOSIS — R1312 Dysphagia, oropharyngeal phase: Secondary | ICD-10-CM | POA: Insufficient documentation

## 2023-09-08 NOTE — Patient Instructions (Signed)
   Smaller sips Chin tuck first - then swallow Crush medications? (Ask your pharmacist)   SWALLOWING EXERCISES Do these for 8-12 weeks, then 2-3 times per week afterward You can use a wet spoon to swallow something, if your mouth gets dry  Effortful Swallows - Swallow your saliva as hard as you can ("swallow a grape whole") - Do at least 30 reps/day, in sets of 5-10

## 2023-09-09 ENCOUNTER — Other Ambulatory Visit: Payer: Self-pay

## 2023-09-09 NOTE — Therapy (Signed)
OUTPATIENT SPEECH LANGUAGE PATHOLOGY ONCOLOGY EVALUATION   Patient Name: Matthew Joyce Sr. MRN: [id number] DOB:August 23, 1951, 72 y.o., male Today's Date: 09/09/2023  PCP: Willow Ora, MD  REFERRING PROVIDER: Iva Boop, MD  END OF SESSION:  End of Session - 09/09/23 0932     Visit Number 1    Number of Visits 17    Date for SLP Re-Evaluation 11/06/23    SLP Start Time 1318    SLP Stop Time  1400    SLP Time Calculation (min) 42 min    Activity Tolerance Patient tolerated treatment well             Past Medical History:  Diagnosis Date   AICD (automatic cardioverter/defibrillator) present 05/2008   St. Jude   Bradycardia    Use of beta blocker limited   CAD (coronary artery disease)    a- S/p cabg in march 2006 by Dr.Owen;  b. Celine Ahr 5/12: large IL scar from apex to base, no ischemia, EF 37%   Cancer (HCC)    on right side of neck   ED (erectile dysfunction)    Gout    Hyperlipidemia    Ischemic cardiomyopathy    a-Echocardiogram, Nov 2006, showed ejection fraction of 30-40% with mild to moderated mitral  regurgitation and mild aortic regurgitation b- Cardiac MRI, May 2007, EF of 44% with 50% scar involving  the inferolateral walls. No comment of mitral regurgitation. c- last echo  11/10: EF 30-35%  mod MR d- Nega T-Wave alternans testing in May of 2007 e- no ACE due to low BP;  f. CPX 3/11: normal   RBBB (right bundle branch block with left anterior fascicular block)    Systolic CHF, chronic (HCC)    Thyroid disease    Past Surgical History:  Procedure Laterality Date   CARDIAC DEFIBRILLATOR PLACEMENT  06/01/08   ICD   COLONOSCOPY     COLONOSCOPY WITH PROPOFOL N/A 07/28/2019   Procedure: COLONOSCOPY WITH PROPOFOL;  Surgeon: Iva Boop, MD;  Location: [facility] ENDOSCOPY;  Service: Endoscopy;  Laterality: N/A;   CORONARY ARTERY BYPASS GRAFT  02-2005   ICD GENERATOR CHANGEOUT N/A 02/03/2017   Procedure: ICD Generator Changeout;  Surgeon: Marinus Maw, MD;   Location: [facility] INVASIVE CV LAB;  Service: Cardiovascular;  Laterality: N/A;   ORIF ANKLE FRACTURE  1987   left    RIGHT/LEFT HEART CATH AND CORONARY/GRAFT ANGIOGRAPHY N/A 09/20/2019   Procedure: RIGHT/LEFT HEART CATH AND CORONARY/GRAFT ANGIOGRAPHY;  Surgeon: Dolores Patty, MD;  Location: [facility] INVASIVE CV LAB;  Service: Cardiovascular;  Laterality: N/A;   SHOULDER ARTHROSCOPY  02/2010   rt    TEE WITHOUT CARDIOVERSION N/A 10/24/2019   Procedure: TRANSESOPHAGEAL ECHOCARDIOGRAM (TEE);  Surgeon: Dolores Patty, MD;  Location: [facility] ENDOSCOPY;  Service: Cardiovascular;  Laterality: N/A;   TONSILLECTOMY     as a child   Patient Active Problem List   Diagnosis Date Noted   Presence of implantable cardioverter-defibrillator (ICD) 06/26/2022   VT (ventricular tachycardia) (HCC) 07/02/2021   Paroxysmal atrial fibrillation (HCC) 07/02/2021   Heme + stool    Internal and external prolapsed hemorrhoids    Difficulty swallowing 01/21/2018   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 [facility]) 12/24/2017   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 (HCC) 11/12/2017   PCP NOTES >>>>>>>>>>>>>>>>>>>>> 04/21/2016   Seborrheic dermatitis of scalp 04/11/2013   Annual physical exam 04/11/2011   MITRAL REGURGITATION 04/10/2010  SYSTOLIC HEART FAILURE, CHRONIC 10/03/2009   Hyperglycemia 06/19/2008   Coronary atherosclerosis 06/14/2008   Automatic implantable cardioverter-defibrillator in situ 06/01/2008   ERECTILE DYSFUNCTION 12/17/2007   Hyperlipidemia 02/06/2007   Gout 02/06/2007   RBBB 02/06/2007   Congestive heart failure (HCC) 02/06/2007   CORONARY ARTERY BYPASS GRAFT, FOUR VESSEL, HX OF 02/12/2005    ONSET DATE: 2019   REFERRING DIAG:  R13.19 (ICD-10-CM) - Esophageal dysphagia    THERAPY DIAG:  Dysphagia, oropharyngeal phase  Rationale for Evaluation and Treatment: Rehabilitation  SUBJECTIVE:   SUBJECTIVE  STATEMENT: "I never saw a speech therapist at all during my radiation." Pt accompanied by: self  PERTINENT HISTORY: He is five years out from completion of his chemoradiation therapy. 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 Remote ischemic cardiomyopathy status post bypass surgery, defib placement. Hyoerlipidemia, gout, hypothyroidism. Ankle fx.   PAIN:  Are you having pain? No  FALLS: Has patient fallen in last 6 months?  No  LIVING ENVIRONMENT: Lives with: lives with their spouse Lives in: House/apartment  PLOF:  Level of assistance: Independent with ADLs, Independent with IADLs Employment: Retired  PATIENT GOALS: Improve swallowing status  OBJECTIVE:   DIAGNOSTIC FINDINGS: MBS 08/05/23: Pt demonstrates a moderate oropharyngeal dyspahgia likely related to late effects of radiation induced fibrosis to muscles of swallowing. Pt observed to have decreased hyoid excursion and epiglottic deflection primarily. Pt has piecemeal swallowing of liquids with good ability to sustain laryngeal elevationa nd vestibular closure with multiple propulsive efforts to push past vallecular space. There is base of tongue retration. and pharyngeal contraction, but they could be stronger to overcome stiffness in pharyngeal structures. Most residue accumulates the in the valleculae; pt uses a liquid rinse with solids and there is trace silent aspiration of thin residue throughout. Pt sensed residue, but not aspiration. Best methods were a chin tuck with solids and liquids which incraesed epigltotic defection. Also intermittent throat clearing. Pt recommended to continue a soft/moist regular diet and thin liquids but with added f/u with OP SLP to initiate a Home exercise program and train chin tuck and throat clearing. Pt also recommended to use strict oral hygiene to reduce bacterial load. Pt at risk of aspiration into the  future; mobility is helpful and swallowing therapy may help pt maintain function. Factors that may increase risk of adverse event in presence of aspiration [name] & [name] 2021):  Frequent aspiration of large volumes;Aspiration of thick, dense, and/or acidic materials   Swallow Evaluation Recommendations Recommendations: PO diet PO Diet Recommendation: Dysphagia 3 (Mechanical soft);Thin liquids (Level 0);Regular Liquid Administration via: Cup;Straw Medication Administration: Crushed with puree Swallowing strategies  : Chin tuck;Clear throat intermittently Postural changes: Position pt fully upright for meals Oral care recommendations: Oral care QID (4x/day)  Esophagram 07/23/23 - No definite mass or stricture is noted in the esophagus. Small sliding-type hiatal hernia is noted. No definite reflux is noted. Barium tablet passed through esophagus and into stomach without significant difficulty or delay.  COGNITION: Overall cognitive status: Within functional limits for tasks assessed  LANGUAGE: Receptive and Expressive language appeared WNL.  ORAL MOTOR EXAMINATION: Overall status: Impaired:   Lingual: Right (ROM and Strength) Comments: slight hypernasality in connected speech?  MOTOR SPEECH: Overall motor speech: Appears intact  CLINICAL SWALLOW ASSESSMENT:   Current diet: regular, Dysphagia 3 (mechanical soft), and thin liquids Objective swallow impairments: see "diagnostic findings" above Objective recommended compensations: see "diagnostic findings" above Dentition:  adequate natural dentition Patient directly observed with POs: Yes: dysphagia 3 (soft) and thin liquids  Feeding: able to feed self Liquids provided by: cup Oral phase signs and symptoms:  none noted Pharyngeal phase signs and symptoms: multiple swallows, complaints of residue, and wiped nose with one swallow H2O   PATIENT REPORTED OUTCOME MEASURES (PROM): EAT-10: to be administered in first 1-2  sessions  TODAY'S TREATMENT:                                                                                                                                         DATE: 09/08/23 (eval): Research states the risk for dysphagia increases due to chemoradiation treatment due to a variety of factors, so SLP educated the pt about possible changes to swallowing musculature after rad tx, and why adherence to dysphagia HEP provided today was necessary to treat/minimize the effects of muscle fibrosis following rad tx. SLP informed pt why this would be detrimental to their swallowing status and to their pulmonary health. Pt demonstrated understanding of these things to SLP.  SLP then developed an individualized HEP for pt involving oral and pharyngeal musculature and pt was instructed how to perform these exercises, including SLP demonstration. Effortful swallow was taught to pt today but pt will also be taught Clair Gulling, Masako, tongue press, and Shaker and/or chin tuck against resistance. After SLP demonstration, pt return demonstrated effortful swallow. SLP ensured pt performance was correct prior to educating pt on next exercise. Pt required min cues faded to modified independent to perform effortful swallow today. Pt was instructed to complete this program 6 days/week, 30 reps/day in sets of at least 5-10, for 8-12 weeks, and then x2-3 a week after that, indefinitely   PATIENT EDUCATION: Education details: late effects head/neck radiation on swallow function and HEP procedure; see "today's treatment" above Person educated: Patient Education method: Explanation, Demonstration, Verbal cues, and Handouts Education comprehension: verbalized understanding, returned demonstration, verbal cues required, and needs further education   ASSESSMENT:  CLINICAL IMPRESSION: Patient is a 72 y.o. M who was seen today for assessment of swallowing following radiation therapy in 2019. Pt has not had any ST to date. Today  pt ate items from Dys III and drank thin liquids, with occasional min cues for following safe swallow strategies of small sips, intermittent throat clear, and consistency with chin tuck. Pt was independent with small bites. At this time pt swallowing is deemed WNL/WFL with these POs as long as he follows safe swallow strategies with POs. No oral deficits were noted today. There are no overt s/s aspiration PNA observed by SLP nor any reported by pt at this time. Pt will cont to need to be seen by SLP in order to assess safety of PO intake, assess the need for recommending any objective swallow assessment, and ensuring pt is correctly completing the individualized HEP.  OBJECTIVE IMPAIRMENTS: include dysphagia. These impairments are  limiting patient from safety when swallowing. Factors affecting potential to achieve goals and functional outcome are severity of impairments. Patient will benefit from skilled SLP services to address above impairments and improve overall function.  REHAB POTENTIAL: Good   GOALS: Goals reviewed with patient? Yes  SHORT TERM GOALS: Target date: 10/09/23  Pt will complete HEP with rare min A in 2 sessions Baseline: Goal status: INITIAL  2.  Pt will follow safe swallow strategies from MBS on 08/05/23 with modified independence in 2 sessions Baseline:  Goal status: INITIAL  3.  Pt will tell SLP 3 overt s/sx aspiration PNA with modified independence  Baseline:  Goal status: INITIAL   LONG TERM GOALS: Target date: 11/06/23  Pt will improve PROM from initial administration Baseline:  Goal status: INITIAL  2.  Pt will complete HEP with modified independence in 2 sessions Baseline:  Goal status: INITIAL  3.  Pt will follow safe swallow strategies from MBS on 08/05/23 in 3 sessions Baseline:  Goal status: INITIAL  4.  Pt will undergo follow up MBS by 11/15/23 to track progress and assess for safety of POs Baseline:  Goal status: INITIAL   PLAN:  SLP  FREQUENCY: 2x/week  SLP DURATION: 8 weeks  PLANNED INTERVENTIONS: Aspiration precaution training, Pharyngeal strengthening exercises, Diet toleration management , SLP instruction and feedback, Compensatory strategies, Patient/family education, and repeat MBS    Reine Bristow, CCC-SLP 09/09/2023, 9:33 AM

## 2023-09-14 ENCOUNTER — Ambulatory Visit: Payer: Medicare HMO

## 2023-09-14 DIAGNOSIS — R1312 Dysphagia, oropharyngeal phase: Secondary | ICD-10-CM | POA: Diagnosis not present

## 2023-09-14 DIAGNOSIS — R1319 Other dysphagia: Secondary | ICD-10-CM | POA: Diagnosis not present

## 2023-09-14 NOTE — Patient Instructions (Signed)
   SWALLOWING EXERCISES Do these for 8-12 weeks, then 2-3 times per week afterward *use a wet spoon if your mouth gets dry*  Effortful Swallows - squeeze the muscles in your neck while you swallow your saliva or a sip of water - Repeat 10-15 times, 2-3 times a day (think 20-30 reps total in sets of 5 or more)  Masako Swallow - swallow with your tongue sticking out - Stick tongue out past your lips and gently bite tongue with your teeth - Swallow, while holding your tongue with your teeth - Repeat 10-15 times, 2-3 times a day (think 20-30 reps total in sets of 5 or more)  Shaker Exercise - head lift - Lie flat on your back in your bed or on a couch without pillows - Raise your head and look at your feet - KEEP YOUR SHOULDERS DOWN - HOLD FOR 45-60 SECONDS, then lower your head back down - Repeat 3 times, 2-3 times a day  [employer] - "swallow and squeeze" exercise - Start to swallow, and keep your Adam's apple up by squeezing hard with the muscles of the throat - Hold the squeeze for 5-7 seconds and then relax - Repeat 10-15 times, 2-3 times a day (think 20-30 reps total in sets of 5 or more)          5.  Tongue Press  - Press your tongue up against the roof of your mouth for 2-3 seconds  - complete 10-15 times, 2-3 times a day (think 20-30 reps total in sets of 5 or more)

## 2023-09-14 NOTE — Therapy (Signed)
OUTPATIENT SPEECH LANGUAGE PATHOLOGY DYSPHAGIA TREATMENT   Patient Name: Matthew VANNORMAN Sr. MRN: [id number] DOB:01/13/1951, 72 y.o., male Today's Date: 09/14/2023  PCP: Willow Ora, MD  REFERRING PROVIDER: Iva Boop, MD  END OF SESSION:  End of Session - 09/14/23 1643     Visit Number 2    Number of Visits 17    Date for SLP Re-Evaluation 11/06/23    SLP Start Time 1535    SLP Stop Time  1618    SLP Time Calculation (min) 43 min    Activity Tolerance Patient tolerated treatment well              Past Medical History:  Diagnosis Date   AICD (automatic cardioverter/defibrillator) present 05/2008   St. Jude   Bradycardia    Use of beta blocker limited   CAD (coronary artery disease)    a- S/p cabg in march 2006 by Dr.Owen;  b. Celine Ahr 5/12: large IL scar from apex to base, no ischemia, EF 37%   Cancer (HCC)    on right side of neck   ED (erectile dysfunction)    Gout    Hyperlipidemia    Ischemic cardiomyopathy    a-Echocardiogram, Nov 2006, showed ejection fraction of 30-40% with mild to moderated mitral  regurgitation and mild aortic regurgitation b- Cardiac MRI, May 2007, EF of 44% with 50% scar involving  the inferolateral walls. No comment of mitral regurgitation. c- last echo  11/10: EF 30-35%  mod MR d- Nega T-Wave alternans testing in May of 2007 e- no ACE due to low BP;  f. CPX 3/11: normal   RBBB (right bundle branch block with left anterior fascicular block)    Systolic CHF, chronic (HCC)    Thyroid disease    Past Surgical History:  Procedure Laterality Date   CARDIAC DEFIBRILLATOR PLACEMENT  06/01/08   ICD   COLONOSCOPY     COLONOSCOPY WITH PROPOFOL N/A 07/28/2019   Procedure: COLONOSCOPY WITH PROPOFOL;  Surgeon: Iva Boop, MD;  Location: [facility] END[facility];  Service: Endoscopy;  Laterality: N/A;   CORONARY ARTERY BYPASS GRAFT  02-2005   ICD GENERATOR CHANGEOUT N/A 02/03/2017   Procedure: ICD Generator Changeout;  Surgeon: Marinus Maw, MD;   Location: [facility] INVASIVE CV LAB;  Service: Cardiovascular;  Laterality: N/A;   ORIF ANKLE FRACTURE  1987   left    RIGHT/LEFT HEART CATH AND CORONARY/GRAFT ANGIOGRAPHY N/A 09/20/2019   Procedure: RIGHT/LEFT HEART CATH AND CORONARY/GRAFT ANGIOGRAPHY;  Surgeon: Dolores Patty, MD;  Location: [facility] INVASIVE CV LAB;  Service: Cardiovascular;  Laterality: N/A;   SHOULDER ARTHROSCOPY  02/2010   rt    TEE WITHOUT CARDIOVERSION N/A 10/24/2019   Procedure: TRANSESOPHAGEAL ECHOCARDIOGRAM (TEE);  Surgeon: Dolores Patty, MD;  Location: [facility] ENDOSCOPY;  Service: Cardiovascular;  Laterality: N/A;   TONSILLECTOMY     as a child   Patient Active Problem List   Diagnosis Date Noted   Presence of implantable cardioverter-defibrillator (ICD) 06/26/2022   VT (ventricular tachycardia) (HCC) 07/02/2021   Paroxysmal atrial fibrillation (HCC) 07/02/2021   Heme + stool    Internal and external prolapsed hemorrhoids    Difficulty swallowing 01/21/2018   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 [facility]) 12/24/2017   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 (HCC) 11/12/2017   PCP NOTES >>>>>>>>>>>>>>>>>>>>> 04/21/2016   Seborrheic dermatitis of scalp 04/11/2013   Annual physical exam 04/11/2011   MITRAL REGURGITATION 04/10/2010  SYSTOLIC HEART FAILURE, CHRONIC 10/03/2009   Hyperglycemia 06/19/2008   Coronary atherosclerosis 06/14/2008   Automatic implantable cardioverter-defibrillator in situ 06/01/2008   ERECTILE DYSFUNCTION 12/17/2007   Hyperlipidemia 02/06/2007   Gout 02/06/2007   RBBB 02/06/2007   Congestive heart failure (HCC) 02/06/2007   CORONARY ARTERY BYPASS GRAFT, FOUR VESSEL, HX OF 02/12/2005    ONSET DATE: 2019   REFERRING DIAG:  R13.19 (ICD-10-CM) - Esophageal dysphagia    THERAPY DIAG:  Dysphagia, oropharyngeal phase  Rationale for Evaluation and Treatment: Rehabilitation  SUBJECTIVE:   SUBJECTIVE  STATEMENT: "It seems to have gone easier (since last session) when I did those things you did with me last time." (Pt, re: strategies) "I didn't have to regurgitate anything." Pt accompanied by: self  PERTINENT HISTORY: He is five years out from completion of his chemoradiation therapy. 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 Remote ischemic cardiomyopathy status post bypass surgery, defib placement. Hyoerlipidemia, gout, hypothyroidism. Ankle fx.   PAIN:  Are you having pain? No  FALLS: Has patient fallen in last 6 months?  No  PATIENT GOALS: Improve swallowing status  OBJECTIVE:   DIAGNOSTIC FINDINGS: MBS 08/05/23: Pt demonstrates a moderate oropharyngeal dyspahgia likely related to late effects of radiation induced fibrosis to muscles of swallowing. Pt observed to have decreased hyoid excursion and epiglottic deflection primarily. Pt has piecemeal swallowing of liquids with good ability to sustain laryngeal elevationa nd vestibular closure with multiple propulsive efforts to push past vallecular space. There is base of tongue retration. and pharyngeal contraction, but they could be stronger to overcome stiffness in pharyngeal structures. Most residue accumulates the in the valleculae; pt uses a liquid rinse with solids and there is trace silent aspiration of thin residue throughout. Pt sensed residue, but not aspiration. Best methods were a chin tuck with solids and liquids which incraesed epigltotic defection. Also intermittent throat clearing. Pt recommended to continue a soft/moist regular diet and thin liquids but with added f/u with OP SLP to initiate a Home exercise program and train chin tuck and throat clearing. Pt also recommended to use strict oral hygiene to reduce bacterial load. Pt at risk of aspiration into the future; mobility is helpful and swallowing therapy may help pt maintain  function. Factors that may increase risk of adverse event in presence of aspiration [name] [name] 2021):  Frequent aspiration of large volumes;Aspiration of thick, dense, and/or acidic materials   Swallow Evaluation Recommendations Recommendations: PO diet PO Diet Recommendation: Dysphagia 3 (Mechanical soft);Thin liquids (Level 0);Regular Liquid Administration via: Cup;Straw Medication Administration: Crushed with puree Swallowing strategies  : Chin tuck;Clear throat intermittently Postural changes: Position pt fully upright for meals Oral care recommendations: Oral care QID (4x/day)  Esophagram 07/23/23 - No definite mass or stricture is noted in the esophagus. Small sliding-type hiatal hernia is noted. No definite reflux is noted. Barium tablet passed through esophagus and into stomach without significant difficulty or delay.   CLINICAL SWALLOW ASSESSMENT:   Current diet: regular, Dysphagia 3 (mechanical soft), and thin liquids Objective swallow impairments: see "diagnostic findings" above Objective recommended compensations: see "diagnostic findings" above Dentition: adequate natural dentition Patient directly observed with POs: Yes: dysphagia 3 (soft) and thin liquids  Feeding: able to feed self Liquids provided by: cup Oral phase signs and symptoms:  none noted Pharyngeal phase signs and symptoms: multiple swallows, complaints of residue, and wiped nose with one swallow H2O   PATIENT REPORTED  OUTCOME MEASURES (PROM): EAT-10: provided 09/14/23 and asked to return next session.  TODAY'S TREATMENT:                                                                                                                                         DATE: 09/14/23: See "s" above: pt having less coughing during meals using safe swallow strategies. Today with POs, pt used chin tuck spontaneously, needed initial min A to use throat clear voluntarily, but independent after first cue.  SLP educated pt  on remainder of HEP - pt req'd model and occasional min A faded to independent by session end.  09/08/23 (eval): Research states the risk for dysphagia increases due to chemoradiation treatment due to a variety of factors, so SLP educated the pt about possible changes to swallowing musculature after rad tx, and why adherence to dysphagia HEP provided today was necessary to treat/minimize the effects of muscle fibrosis following rad tx. SLP informed pt why this would be detrimental to their swallowing status and to their pulmonary health. Pt demonstrated understanding of these things to SLP.  SLP then developed an individualized HEP for pt involving oral and pharyngeal musculature and pt was instructed how to perform these exercises, including SLP demonstration. Effortful swallow was taught to pt today but pt will also be taught Clair Gulling, Masako, tongue press, and Shaker and/or chin tuck against resistance. After SLP demonstration, pt return demonstrated effortful swallow. SLP ensured pt performance was correct prior to educating pt on next exercise. Pt required min cues faded to modified independent to perform effortful swallow today. Pt was instructed to complete this program 6 days/week, 30 reps/day in sets of at least 5-10, for 8-12 weeks, and then x2-3 a week after that, indefinitely   PATIENT EDUCATION: Education details: see "today's treatment" above Person educated: Patient Education method: Explanation, Demonstration, Verbal cues, and Handouts Education comprehension: verbalized understanding, returned demonstration, verbal cues required, and needs further education   ASSESSMENT:  CLINICAL IMPRESSION: Patient is a 72 y.o. M who was seen today for treatment of swallowing following radiation therapy in 2019. Pt has not had any ST to date. Today pt ate items from Dys III and drank thin liquids, as described above in "today's treatment". HEP performance as above in "today's treatment". No oral  deficits were noted today. There are no overt s/s aspiration PNA observed by SLP nor any reported by pt at this time. Pt will cont to need to be seen by SLP in order to assess safety of PO intake, assess the need for recommending any objective swallow assessment, and ensuring pt is correctly completing the individualized HEP.  OBJECTIVE IMPAIRMENTS: include dysphagia. These impairments are limiting patient from safety when swallowing. Factors affecting potential to achieve goals and functional outcome are severity of impairments. Patient will benefit from skilled SLP services to address above impairments and improve overall function.  REHAB POTENTIAL: Good  GOALS: Goals reviewed with patient? Yes  SHORT TERM GOALS: Target date: 10/09/23  Pt will complete HEP with rare min A in 2 sessions Baseline: Goal status: INITIAL  2.  Pt will follow safe swallow strategies from MBS on 08/05/23 with modified independence in 2 sessions Baseline:  Goal status: INITIAL  3.  Pt will tell SLP 3 overt s/sx aspiration PNA with modified independence  Baseline:  Goal status: INITIAL   LONG TERM GOALS: Target date: 11/06/23  Pt will improve PROM from initial administration Baseline:  Goal status: INITIAL  2.  Pt will complete HEP with modified independence in 2 sessions Baseline:  Goal status: INITIAL  3.  Pt will follow safe swallow strategies from MBS on 08/05/23 in 3 sessions Baseline:  Goal status: INITIAL  4.  Pt will undergo follow up MBS by 11/15/23 to track progress and assess for safety of POs Baseline:  Goal status: INITIAL   PLAN:  SLP FREQUENCY: 2x/week  SLP DURATION: 8 weeks  PLANNED INTERVENTIONS: Aspiration precaution training, Pharyngeal strengthening exercises, Diet toleration management , SLP instruction and feedback, Compensatory strategies, Patient/family education, and repeat MBS    Natallia Stellmach, CCC-SLP 09/14/2023, 4:44 PM

## 2023-09-15 ENCOUNTER — Ambulatory Visit: Payer: Medicare HMO | Attending: Internal Medicine

## 2023-09-15 DIAGNOSIS — R1312 Dysphagia, oropharyngeal phase: Secondary | ICD-10-CM | POA: Insufficient documentation

## 2023-09-15 NOTE — Therapy (Signed)
OUTPATIENT SPEECH LANGUAGE PATHOLOGY DYSPHAGIA TREATMENT   Patient Name: Matthew ARMINIO Sr. MRN: [id number] DOB:[birth year], 72 y.o., male Today's Date: 09/15/2023  PCP: Willow Ora, MD  REFERRING PROVIDER: Iva Boop, MD  END OF SESSION:  End of Session - 09/15/23 1127     Visit Number 3    Number of Visits 17    Date for SLP Re-Evaluation 11/06/23    SLP Start Time 1104    SLP Stop Time  1126    SLP Time Calculation (min) 22 min    Activity Tolerance Patient tolerated treatment well               Past Medical History:  Diagnosis Date   AICD (automatic cardioverter/defibrillator) present 05/2008   St. Jude   Bradycardia    Use of beta blocker limited   CAD (coronary artery disease)    a- S/p cabg in march 2006 by Dr.Owen;  b. Celine Ahr 5/12: large IL scar from apex to base, no ischemia, EF 37%   Cancer (HCC)    on right side of neck   ED (erectile dysfunction)    Gout    Hyperlipidemia    Ischemic cardiomyopathy    a-Echocardiogram, Nov 2006, showed ejection fraction of 30-40% with mild to moderated mitral  regurgitation and mild aortic regurgitation b- Cardiac MRI, May 2007, EF of 44% with 50% scar involving  the inferolateral walls. No comment of mitral regurgitation. c- last echo  11/10: EF 30-35%  mod MR d- Nega T-Wave alternans testing in May of 2007 e- no ACE due to low BP;  f. CPX 3/11: normal   RBBB (right bundle branch block with left anterior fascicular block)    Systolic CHF, chronic (HCC)    Thyroid disease    Past Surgical History:  Procedure Laterality Date   CARDIAC DEFIBRILLATOR PLACEMENT  06/01/08   ICD   COLONOSCOPY     COLONOSCOPY WITH PROPOFOL N/A 07/28/2019   Procedure: COLONOSCOPY WITH PROPOFOL;  Surgeon: Iva Boop, MD;  Location: [facility] ENDOSCOPY;  Service: Endoscopy;  Laterality: N/A;   CORONARY ARTERY BYPASS GRAFT  02-2005   ICD GENERATOR CHANGEOUT N/A 02/03/2017   Procedure: ICD Generator Changeout;  Surgeon: Marinus Maw, MD;   Location: [facility] INVASIVE CV LAB;  Service: Cardiovascular;  Laterality: N/A;   ORIF ANKLE FRACTURE  1987   left    RIGHT/LEFT HEART CATH AND CORONARY/GRAFT ANGIOGRAPHY N/A 09/20/2019   Procedure: RIGHT/LEFT HEART CATH AND CORONARY/GRAFT ANGIOGRAPHY;  Surgeon: Dolores Patty, MD;  Location: [facility] INVASIVE CV LAB;  Service: Cardiovascular;  Laterality: N/A;   SHOULDER ARTHROSCOPY  02/2010   rt    TEE WITHOUT CARDIOVERSION N/A 10/24/2019   Procedure: TRANSESOPHAGEAL ECHOCARDIOGRAM (TEE);  Surgeon: Dolores Patty, MD;  Location: [facility] ENDOSCOPY;  Service: Cardiovascular;  Laterality: N/A;   TONSILLECTOMY     as a child   Patient Active Problem List   Diagnosis Date Noted   Presence of implantable cardioverter-defibrillator (ICD) 06/26/2022   VT (ventricular tachycardia) (HCC) 07/02/2021   Paroxysmal atrial fibrillation (HCC) 07/02/2021   Heme + stool    Internal and external prolapsed hemorrhoids    Difficulty swallowing 01/21/2018   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 [facility] 12/24/2017   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 (HCC) 11/12/2017   PCP NOTES >>>>>>>>>>>>>>>>>>>>> 04/21/2016   Seborrheic dermatitis of scalp 04/11/2013   Annual physical exam 04/11/2011   MITRAL REGURGITATION 04/10/2010  SYSTOLIC HEART FAILURE, CHRONIC 10/03/2009   Hyperglycemia 06/19/2008   Coronary atherosclerosis 06/14/2008   Automatic implantable cardioverter-defibrillator in situ 06/01/2008   ERECTILE DYSFUNCTION 12/17/2007   Hyperlipidemia 02/06/2007   Gout 02/06/2007   RBBB 02/06/2007   Congestive heart failure (HCC) 02/06/2007   CORONARY ARTERY BYPASS GRAFT, FOUR VESSEL, HX OF 02/12/2005    ONSET DATE: 2019   REFERRING DIAG:  R13.19 (ICD-10-CM) - Esophageal dysphagia    THERAPY DIAG:  Dysphagia, oropharyngeal phase  Rationale for Evaluation and Treatment: Rehabilitation  SUBJECTIVE:   SUBJECTIVE  STATEMENT: "It seems to have gone easier (since last session) when I did those things you did with me last time." (Pt, re: strategies) "I didn't have to regurgitate anything." Pt accompanied by: self  PERTINENT HISTORY: He is five years out from completion of his chemoradiation therapy. 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 Remote ischemic cardiomyopathy status post bypass surgery, defib placement. Hyoerlipidemia, gout, hypothyroidism. Ankle fx.   PAIN:  Are you having pain? No  FALLS: Has patient fallen in last 6 months?  No  PATIENT GOALS: Improve swallowing status  OBJECTIVE:   DIAGNOSTIC FINDINGS: MBS 08/05/23: Pt demonstrates a moderate oropharyngeal dyspahgia likely related to late effects of radiation induced fibrosis to muscles of swallowing. Pt observed to have decreased hyoid excursion and epiglottic deflection primarily. Pt has piecemeal swallowing of liquids with good ability to sustain laryngeal elevationa nd vestibular closure with multiple propulsive efforts to push past vallecular space. There is base of tongue retration. and pharyngeal contraction, but they could be stronger to overcome stiffness in pharyngeal structures. Most residue accumulates the in the valleculae; pt uses a liquid rinse with solids and there is trace silent aspiration of thin residue throughout. Pt sensed residue, but not aspiration. Best methods were a chin tuck with solids and liquids which incraesed epigltotic defection. Also intermittent throat clearing. Pt recommended to continue a soft/moist regular diet and thin liquids but with added f/u with OP SLP to initiate a Home exercise program and train chin tuck and throat clearing. Pt also recommended to use strict oral hygiene to reduce bacterial load. Pt at risk of aspiration into the future; mobility is helpful and swallowing therapy may help pt maintain  function. Factors that may increase risk of adverse event in presence of aspiration [name] & [name] 2021):  Frequent aspiration of large volumes;Aspiration of thick, dense, and/or acidic materials   Swallow Evaluation Recommendations Recommendations: PO diet PO Diet Recommendation: Dysphagia 3 (Mechanical soft);Thin liquids (Level 0);Regular Liquid Administration via: Cup;Straw Medication Administration: Crushed with puree Swallowing strategies  : Chin tuck;Clear throat intermittently Postural changes: Position pt fully upright for meals Oral care recommendations: Oral care QID (4x/day)  Esophagram 07/23/23 - No definite mass or stricture is noted in the esophagus. Small sliding-type hiatal hernia is noted. No definite reflux is noted. Barium tablet passed through esophagus and into stomach without significant difficulty or delay.   CLINICAL SWALLOW ASSESSMENT:   Current diet: regular, Dysphagia 3 (mechanical soft), and thin liquids Objective swallow impairments: see "diagnostic findings" above Objective recommended compensations: see "diagnostic findings" above Dentition: adequate natural dentition Patient directly observed with POs: Yes: dysphagia 3 (soft) and thin liquids  Feeding: able to feed self Liquids provided by: cup Oral phase signs and symptoms:  none noted Pharyngeal phase signs and symptoms: multiple swallows, complaints of residue, and wiped nose with one swallow H2O   PATIENT REPORTED  OUTCOME MEASURES (PROM): EAT-10: returned on 09/15/23 with score of 32/40 (higher numbers indicate worse QOL/more challenge with eating).  TODAY'S TREATMENT:                                                                                                                                         DATE: 09/15/23: Pt reports Shaker is challenging - SLP encouraged pt to cont to perform this and he will ultimately get to 45 seconds. Pt completed HEP today with min A initially for Masako for tongue  protrusion, and Mendelsohn for hold at top of thyroid excursion without assistance from hand -pt was independent by session end; Other exercises were completed with independence. Pt was told he will need to cont to perform this HEP for at least 8-10 weeks to maximize effects.  09/14/23: See "s" above: pt having less coughing during meals using safe swallow strategies. Today with POs, pt used chin tuck spontaneously, needed initial min A to use throat clear voluntarily, but independent after first cue.  SLP educated pt on remainder of HEP - pt req'd model and occasional min A faded to independent by session end.  09/08/23 (eval): Research states the risk for dysphagia increases due to chemoradiation treatment due to a variety of factors, so SLP educated the pt about possible changes to swallowing musculature after rad tx, and why adherence to dysphagia HEP provided today was necessary to treat/minimize the effects of muscle fibrosis following rad tx. SLP informed pt why this would be detrimental to their swallowing status and to their pulmonary health. Pt demonstrated understanding of these things to SLP.  SLP then developed an individualized HEP for pt involving oral and pharyngeal musculature and pt was instructed how to perform these exercises, including SLP demonstration. Effortful swallow was taught to pt today but pt will also be taught Clair Gulling, Masako, tongue press, and Shaker and/or chin tuck against resistance. After SLP demonstration, pt return demonstrated effortful swallow. SLP ensured pt performance was correct prior to educating pt on next exercise. Pt required min cues faded to modified independent to perform effortful swallow today. Pt was instructed to complete this program 6 days/week, 30 reps/day in sets of at least 5-10, for 8-12 weeks, and then x2-3 a week after that, indefinitely   PATIENT EDUCATION: Education details: see "today's treatment" above Person educated: Patient Education  method: Explanation, Demonstration, and Verbal cues Education comprehension: verbalized understanding, returned demonstration, verbal cues required, and needs further education   ASSESSMENT:  CLINICAL IMPRESSION: Patient is a 72 y.o. M who was seen today for treatment of swallowing following radiation therapy in 2019. Pt has not had any ST to date. See above in "today's treatment" for more details. No oral deficits were noted today. There are no overt s/s aspiration PNA observed by SLP nor any reported by pt at this time. Pt will cont to need to be seen by SLP  in order to assess safety of PO intake, assess the need for recommending any objective swallow assessment, and ensuring pt is correctly completing the individualized HEP.  OBJECTIVE IMPAIRMENTS: include dysphagia. These impairments are limiting patient from safety when swallowing. Factors affecting potential to achieve goals and functional outcome are severity of impairments. Patient will benefit from skilled SLP services to address above impairments and improve overall function.  REHAB POTENTIAL: Good   GOALS: Goals reviewed with patient? Yes  SHORT TERM GOALS: Target date: 10/09/23  Pt will complete HEP with rare min A in 2 sessions Baseline: Goal status: INITIAL  2.  Pt will follow safe swallow strategies from MBS on 08/05/23 with modified independence in 2 sessions Baseline:  Goal status: INITIAL  3.  Pt will tell SLP 3 overt s/sx aspiration PNA with modified independence  Baseline:  Goal status: INITIAL   LONG TERM GOALS: Target date: 11/06/23  Pt will improve PROM from initial administration Baseline:  Goal status: INITIAL  2.  Pt will complete HEP with modified independence in 2 sessions Baseline:  Goal status: INITIAL  3.  Pt will follow safe swallow strategies from MBS on 08/05/23 in 3 sessions Baseline:  Goal status: INITIAL  4.  Pt will undergo follow up MBS by 11/15/23 to track progress and assess for  safety of POs Baseline:  Goal status: INITIAL   PLAN:  SLP FREQUENCY: 2x/week  SLP DURATION: 8 weeks  PLANNED INTERVENTIONS: Aspiration precaution training, Pharyngeal strengthening exercises, Diet toleration management , SLP instruction and feedback, Compensatory strategies, Patient/family education, and repeat MBS    Zed Wanninger, CCC-SLP 09/15/2023, 11:42 AM

## 2023-09-17 ENCOUNTER — Ambulatory Visit: Payer: Medicare HMO | Admitting: Internal Medicine

## 2023-09-21 ENCOUNTER — Ambulatory Visit: Payer: Medicare HMO

## 2023-09-21 DIAGNOSIS — I480 Paroxysmal atrial fibrillation: Secondary | ICD-10-CM

## 2023-09-21 DIAGNOSIS — E785 Hyperlipidemia, unspecified: Secondary | ICD-10-CM

## 2023-09-21 NOTE — Progress Notes (Unsigned)
Pharmacy Note  09/21/2023 Name: Matthew KRUZAN Sr. MRN: [id number] DOB: 04-06-1951  Subjective: Matthew Paris Sr. is a 72 y.o. year old male who is a primary care patient of Wanda Plump, MD. Clinical Pharmacist Practitioner referral was placed to assist with medication management.    Engaged with patient face to face for follow up visit today.  Hypertension / CHF Current therapy candasartan 8mg  daily, spironolacton 25mg  0.5 tablet = 12.5mg  daily, furosemide 40mg  every OTHER day and Farxiga 10mg  daily.    BP Readings from Last 3 Encounters:  08/10/23 128/66  07/01/23 (!) 94/56  06/02/23 122/66    Reviewed refill history -  filled candasartan 8mg  for #90 tabs 07/25/2023  ECHO 04/23/2023 show stable EF of 25-30%  Patient state he feels wells. He is golfing several times per week and walking for exercise.   Afib:  Rate controlling medication - amiodarone 200mg  daily and mexiletine 250mg  twice a day.   Anticoagulation: Eliquis 5mg  twice a day .   Hyperlipidemia:  Last LDL was at goal of < 55 Taking rosuvastatin 40mg  daily - LR for 90 DS was 07/31/2023   Objective: Review of patient status, including review of consultants reports, laboratory and other test data, was performed as part of comprehensive evaluation and provision of chronic care management services.   Lab Results  Component Value Date   CREATININE 1.06 04/23/2023   CREATININE 0.97 02/26/2023   CREATININE 1.03 11/25/2022    Lab Results  Component Value Date   HGBA1C 6.1 02/26/2023       Component Value Date/Time   CHOL 131 06/02/2023 0842   TRIG 51.0 06/02/2023 0842   TRIG 45 12/16/2006 0912   HDL 65.30 06/02/2023 0842   CHOLHDL 2 06/02/2023 0842   VLDL 10.2 06/02/2023 0842   LDLCALC 56 06/02/2023 0842     Clinical ASCVD: Yes  The 10-year ASCVD risk score (Arnett DK, et al., 2019) is: 17%   Values used to calculate the score:     Age: 34 years     Sex: Male     Is Non-Hispanic African  American: No     Diabetic: No     Tobacco smoker: No     Systolic Blood Pressure: 128 mmHg     Is BP treated: No     HDL Cholesterol: 65.3 mg/dL     Total Cholesterol: 131 mg/dL    BP Readings from Last 3 Encounters:  08/10/23 128/66  07/01/23 (!) 94/56  06/02/23 122/66     Allergies  Allergen Reactions   Atorvastatin      myalgia, Muscle aching   Entresto [Sacubitril-Valsartan] Itching    Had itching and redness   Sulfonamide Derivatives     Rash and hot flashes   Sulfamethoxazole Rash and Other (See Comments)    Hot flashes   Tape Other (See Comments)    Rips skin- use PAPER tape    Medications Reviewed Today     Reviewed by Henrene Pastor, RPH-CPP (Pharmacist) on 09/21/23 at 1657  Med List Status: <None>   Medication Order Taking? Sig Documenting Provider Last Dose Status Informant  acetaminophen (TYLENOL) 325 MG tablet [id number] Yes Take 650 mg by mouth every 6 (six) hours as needed (for pain.). [provider] Taking Active Self           Med Note Crissie Reese Oct 12, 2019  2:03 PM)    amiodarone (PACERONE) 200 MG tablet [id number] Yes  Take 1 tablet (200 mg total) by mouth daily. Marinus Maw, MD Taking Active   apixaban [facility]) 5 MG TABS tablet [id number] Yes TAKE 1 TABLET BY MOUTH TWICE A DAY Marinus Maw, MD Taking Active   candesartan (ATACAND) 8 MG tablet [id number] Yes TAKE 1/2 TABLET (4 MG TOTAL) BY MOUTH 2 (TWO) TIMES DAILY. Bensimhon, Bevelyn Buckles, MD Taking Active   carboxymethylcellul-glycerin (OPTIVE) 0.5-0.9 % ophthalmic solution [id number] Yes Place 1 drop into both eyes daily. [provider] Taking Active Self  cetirizine (ZYRTEC) 10 MG tablet [id number] Yes Take 10 mg by mouth as needed for allergies. [provider] Taking Active Self  Colchicine (MITIGARE) 0.6 MG CAPS [id number]  Take 0.6 mg by mouth 2 (two) times daily as needed (gout flare). Wanda Plump, MD  Active            Med Note (CANTER, Vicente Males D   Tue  Jun 02, 2023  7:57 AM) prn  dapagliflozin propanediol (FARXIGA) 10 MG TABS tablet [id number] Yes Take 1 tablet (10 mg total) by mouth daily. (Take BEFORE BREAKFAST) Wanda Plump, MD Taking Active   furosemide (LASIX) 40 MG tablet [id number] Yes TAKE 1 TABLET BY MOUTH EVERY DAY  Patient taking differently: Take 40 mg by mouth every other day.   Bensimhon, Bevelyn Buckles, MD Taking Active   levothyroxine (SYNTHROID) 88 MCG tablet [id number] Yes Take 1 tablet (88 mcg total) by mouth daily before breakfast. Wanda Plump, MD Taking Active   mexiletine (MEXITIL) 250 MG capsule [id number] Yes TAKE 1 CAPSULE BY MOUTH TWICE A DAY Bensimhon, Bevelyn Buckles, MD Taking Active   nitroGLYCERIN (NITROSTAT) 0.4 MG SL tablet [id number] Yes Place 1 tablet (0.4 mg total) under the tongue every 5 (five) minutes x 3 doses as needed for chest pain. Wanda Plump, MD Taking Active            Med Note [facility], Paula Libra   Tue Jun 02, 2023  7:57 AM) PRN  Omega-3 Fatty Acids (FISH OIL) 1200 MG CAPS [id number] Yes Take 3,600 mg by mouth daily. [provider] Taking Active Self  rosuvastatin (CRESTOR) 40 MG tablet [id number] Yes Take 1 tablet (40 mg total) by mouth daily. Wanda Plump, MD Taking Active   spironolactone (ALDACTONE) 25 MG tablet [id number] Yes Take 0.5 tablets (12.5 mg total) by mouth daily. 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 (HCC) 11/12/2017   PCP NOTES >>>>>>>>>>>>>>>>>>>>> 04/21/2016   Seborrheic dermatitis of  scalp 04/11/2013   Annual physical exam 04/11/2011   MITRAL REGURGITATION 04/10/2010   SYSTOLIC HEART FAILURE, CHRONIC 10/03/2009   Hyperglycemia 06/19/2008   Coronary atherosclerosis 06/14/2008   Automatic implantable cardioverter-defibrillator in situ 06/01/2008   ERECTILE DYSFUNCTION 12/17/2007   Hyperlipidemia 02/06/2007   Gout 02/06/2007   RBBB 02/06/2007   Congestive heart failure (HCC) 02/06/2007   CORONARY ARTERY BYPASS GRAFT, FOUR VESSEL, HX OF 02/12/2005     Medication Assistance:   Marcelline Deist obtained through [facility]  and Me Program  medication assistance program.  Enrollment ends 12/15/2023  Patient has received letter from [facility] and me for 2025 re-enrollment and comes in today for assistance with this since he does not feel comfortable doing it with his phone or on-line.     Assessment / Plan: Hyperlipidemia - LDL goal < 55 - at goal  Continue rosuvastatin daily   HFrEF - stable Continue Farxiga 10mg  daily, spironolactone 25mg  daily and candesartan 8mg  daily and furosemide 40mg  every OTHER day Completed patient and provider portion of 2025 [place] application. Patient signed today. Forwarded to PCP to review and sign.   Afib Continue amiodarone, mexiletine 250mg  and Eliquis.  Monitoring out of pocket spend to see if can apply for Eliquis medication assistance program later in 2024.    Use Medicare.gov website to show patient what his cost for 2025 might look like.  If he is approved to get [place] in 2025, this would not contribute to the $2000 max out of pocket for Medicare patients. He would likely have cost of around $200 per month until he reaches $2000 out of pocket - estimate this will happen around August, then monthly cost would be $0.    Follow Up:  Telephone follow up appointment with care management team member scheduled for:  December 2024   Henrene Pastor, PharmD Clinical Pharmacist [facility]  - [facility] [phone]

## 2023-09-22 ENCOUNTER — Ambulatory Visit: Payer: Medicare HMO

## 2023-09-22 DIAGNOSIS — R1312 Dysphagia, oropharyngeal phase: Secondary | ICD-10-CM | POA: Diagnosis not present

## 2023-09-22 NOTE — Therapy (Signed)
OUTPATIENT SPEECH LANGUAGE PATHOLOGY DYSPHAGIA TREATMENT   Patient Name: Matthew BAAB Sr. MRN: [id number] DOB:May 22, 1951, 72 y.o., male Today's Date: 09/22/2023  PCP: Willow Ora, MD  REFERRING PROVIDER: Iva Boop, MD  END OF SESSION:  End of Session - 09/22/23 0919     Visit Number 4    Number of Visits 17    Date for SLP Re-Evaluation 11/06/23    SLP Start Time 0849    SLP Stop Time  0917    SLP Time Calculation (min) 28 min    Activity Tolerance Patient tolerated treatment well                Past Medical History:  Diagnosis Date   AICD (automatic cardioverter/defibrillator) present 05/2008   St. Jude   Bradycardia    Use of beta blocker limited   CAD (coronary artery disease)    a- S/p cabg in march 2006 by Dr.Owen;  b. myoview 5/12: large IL scar from apex to base, no ischemia, EF 37%   Cancer (HCC)    on right side of neck   ED (erectile dysfunction)    Gout    Hyperlipidemia    Ischemic cardiomyopathy    a-Echocardiogram, Nov 2006, showed ejection fraction of 30-40% with mild to moderated mitral  regurgitation and mild aortic regurgitation b- Cardiac MRI, May 2007, EF of 44% with 50% scar involving  the inferolateral walls. No comment of mitral regurgitation. c- last echo  11/10: EF 30-35%  mod MR d- Nega T-Wave alternans testing in May of 2007 e- no ACE due to low BP;  f. CPX 3/11: normal   RBBB (right bundle branch block with left anterior fascicular block)    Systolic CHF, chronic (HCC)    Thyroid disease    Past Surgical History:  Procedure Laterality Date   CARDIAC DEFIBRILLATOR PLACEMENT  06/01/08   ICD   COLONOSCOPY     COLONOSCOPY WITH PROPOFOL N/A 07/28/2019   Procedure: COLONOSCOPY WITH PROPOFOL;  Surgeon: Iva Boop, MD;  Location: [facility];  Service: Endoscopy;  Laterality: N/A;   CORONARY ARTERY BYPASS GRAFT  02-2005   ICD GENERATOR CHANGEOUT N/A 02/03/2017   Procedure: ICD Generator Changeout;  Surgeon: Marinus Maw, MD;   Location: [facility] INVASIVE CV LAB;  Service: Cardiovascular;  Laterality: N/A;   ORIF ANKLE FRACTURE  1987   left    RIGHT/LEFT HEART CATH AND CORONARY/GRAFT ANGIOGRAPHY N/A 09/20/2019   Procedure: RIGHT/LEFT HEART CATH AND CORONARY/GRAFT ANGIOGRAPHY;  Surgeon: Dolores Patty, MD;  Location: [facility] INVASIVE CV LAB;  Service: Cardiovascular;  Laterality: N/A;   SHOULDER ARTHROSCOPY  02/2010   rt    TEE WITHOUT CARDIOVERSION N/A 10/24/2019   Procedure: TRANSESOPHAGEAL ECHOCARDIOGRAM (TEE);  Surgeon: Dolores Patty, MD;  Location[facility] ENDOSCOPY;  Service: Cardiovascular;  Laterality: N/A;   TONSILLECTOMY     as a child   Patient Active Problem List   Diagnosis Date Noted   Presence of implantable cardioverter-defibrillator (ICD) 06/26/2022   VT (ventricular tachycardia) (HCC) 07/02/2021   Paroxysmal atrial fibrillation (HCC) 07/02/2021   Heme + stool    Internal and external prolapsed hemorrhoids    Difficulty swallowing 01/21/2018   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 [facility]) 12/24/2017   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 (HCC) 11/12/2017   PCP NOTES >>>>>>>>>>>>>>>>>>>>> 04/21/2016   Seborrheic dermatitis of scalp 04/11/2013   Annual physical exam 04/11/2011   MITRAL REGURGITATION  04/10/2010   SYSTOLIC HEART FAILURE, CHRONIC 10/03/2009   Hyperglycemia 06/19/2008   Coronary atherosclerosis 06/14/2008   Automatic implantable cardioverter-defibrillator in situ 06/01/2008   ERECTILE DYSFUNCTION 12/17/2007   Hyperlipidemia 02/06/2007   Gout 02/06/2007   RBBB 02/06/2007   Congestive heart failure (HCC) 02/06/2007   CORONARY ARTERY BYPASS GRAFT, FOUR VESSEL, HX OF 02/12/2005    ONSET DATE: 2019   REFERRING DIAG:  R13.19 (ICD-10-CM) - Esophageal dysphagia    THERAPY DIAG:  Dysphagia, oropharyngeal phase  Rationale for Evaluation and Treatment: Rehabilitation  SUBJECTIVE:   SUBJECTIVE  STATEMENT: "Before I started coming here they (peanuts) were darn near impossible to eat." Pt states he ate peanuts last night. Pt accompanied by: self  PERTINENT HISTORY: He is five years out from completion of his chemoradiation therapy. 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 Remote ischemic cardiomyopathy status post bypass surgery, defib placement. Hyoerlipidemia, gout, hypothyroidism. Ankle fx.   PAIN:  Are you having pain? No  FALLS: Has patient fallen in last 6 months?  No  PATIENT GOALS: Improve swallowing status  OBJECTIVE:   DIAGNOSTIC FINDINGS: MBS 08/05/23: Pt demonstrates a moderate oropharyngeal dyspahgia likely related to late effects of radiation induced fibrosis to muscles of swallowing. Pt observed to have decreased hyoid excursion and epiglottic deflection primarily. Pt has piecemeal swallowing of liquids with good ability to sustain laryngeal elevationa nd vestibular closure with multiple propulsive efforts to push past vallecular space. There is base of tongue retration. and pharyngeal contraction, but they could be stronger to overcome stiffness in pharyngeal structures. Most residue accumulates the in the valleculae; pt uses a liquid rinse with solids and there is trace silent aspiration of thin residue throughout. Pt sensed residue, but not aspiration. Best methods were a chin tuck with solids and liquids which incraesed epigltotic defection. Also intermittent throat clearing. Pt recommended to continue a soft/moist regular diet and thin liquids but with added f/u with OP SLP to initiate a Home exercise program and train chin tuck and throat clearing. Pt also recommended to use strict oral hygiene to reduce bacterial load. Pt at risk of aspiration into the future; mobility is helpful and swallowing therapy may help pt maintain function. Factors that may increase risk of adverse event  in presence of aspiration [name] & [name] 2021):  Frequent aspiration of large volumes;Aspiration of thick, dense, and/or acidic materials   Swallow Evaluation Recommendations Recommendations: PO diet PO Diet Recommendation: Dysphagia 3 (Mechanical soft);Thin liquids (Level 0);Regular Liquid Administration via: Cup;Straw Medication Administration: Crushed with puree Swallowing strategies  : Chin tuck;Clear throat intermittently Postural changes: Position pt fully upright for meals Oral care recommendations: Oral care QID (4x/day)  Esophagram 07/23/23 - No definite mass or stricture is noted in the esophagus. Small sliding-type hiatal hernia is noted. No definite reflux is noted. Barium tablet passed through esophagus and into stomach without significant difficulty or delay.   CLINICAL SWALLOW ASSESSMENT:   Current diet: regular, Dysphagia 3 (mechanical soft), and thin liquids Objective swallow impairments: see "diagnostic findings" above Objective recommended compensations: see "diagnostic findings" above Dentition: adequate natural dentition Patient directly observed with POs: Yes: dysphagia 3 (soft) and thin liquids  Feeding: able to feed self Liquids provided by: cup Oral phase signs and symptoms:  none noted Pharyngeal phase signs and symptoms: multiple swallows, complaints of residue, and wiped nose with one swallow H2O   PATIENT REPORTED OUTCOME MEASURES (PROM): EAT-10: returned on  09/15/23 with score of 32/40 (higher numbers indicate worse QOL/more challenge with eating).  TODAY'S TREATMENT:                                                                                                                                         DATE: 09/22/23: "I can tell a big difference." Pt reports he is able to eat everything easier than he was prior to initiation of ST. Another example is he had chicken last night and was able to feel it pass through pharynx; Pt stated, "It used to always get  hung." With POS, pt followed precautions but SLP suggested pt clear this throat x3-4 more during POs. One instance of "wet" voice which pt cleared with immediate spontaneous throat clear/reswallow. With HEP, pt was modified independent.   09/15/23: Pt reports Shaker is challenging - SLP encouraged pt to cont to perform this and he will ultimately get to 45 seconds. Pt completed HEP today with min A initially for Masako for tongue protrusion, and Mendelsohn for hold at top of thyroid excursion without assistance from hand -pt was independent by session end; Other exercises were completed with independence. Pt was told he will need to cont to perform this HEP for at least 8-10 weeks to maximize effects.  09/14/23: See "s" above: pt having less coughing during meals using safe swallow strategies. Today with POs, pt used chin tuck spontaneously, needed initial min A to use throat clear voluntarily, but independent after first cue.  SLP educated pt on remainder of HEP - pt req'd model and occasional min A faded to independent by session end.  09/08/23 (eval): Research states the risk for dysphagia increases due to chemoradiation treatment due to a variety of factors, so SLP educated the pt about possible changes to swallowing musculature after rad tx, and why adherence to dysphagia HEP provided today was necessary to treat/minimize the effects of muscle fibrosis following rad tx. SLP informed pt why this would be detrimental to their swallowing status and to their pulmonary health. Pt demonstrated understanding of these things to SLP.  SLP then developed an individualized HEP for pt involving oral and pharyngeal musculature and pt was instructed how to perform these exercises, including SLP demonstration. Effortful swallow was taught to pt today but pt will also be taught Clair Gulling, Masako, tongue press, and Shaker and/or chin tuck against resistance. After SLP demonstration, pt return demonstrated effortful  swallow. SLP ensured pt performance was correct prior to educating pt on next exercise. Pt required min cues faded to modified independent to perform effortful swallow today. Pt was instructed to complete this program 6 days/week, 30 reps/day in sets of at least 5-10, for 8-12 weeks, and then x2-3 a week after that, indefinitely   PATIENT EDUCATION: Education details: see "today's treatment" above Person educated: Patient Education method: Explanation, Demonstration, and Verbal cues Education comprehension: verbalized understanding, returned demonstration, verbal cues  required, and needs further education   ASSESSMENT:  CLINICAL IMPRESSION: Patient is a 72 y.o. M who was seen today for treatment of swallowing following radiation therapy in 2019. Pt has not had any ST to date. See above in "today's treatment" for more details. No oral deficits were noted today. There was one instance of wet voice which cleared with spontaneous throat clear/reswallow. are no overt s/s aspiration PNA observed by SLP nor any reported by pt at this time. Pt will cont to need to be seen by SLP in order to assess safety of PO intake, assess the need for recommending any objective swallow assessment, and ensuring pt is correctly completing the individualized HEP.  OBJECTIVE IMPAIRMENTS: include dysphagia. These impairments are limiting patient from safety when swallowing. Factors affecting potential to achieve goals and functional outcome are severity of impairments. Patient will benefit from skilled SLP services to address above impairments and improve overall function.  REHAB POTENTIAL: Good   GOALS: Goals reviewed with patient? Yes  SHORT TERM GOALS: Target date: 10/09/23  Pt will complete HEP with rare min A in 2 sessions Baseline: 09/22/23 Goal status: INITIAL  2.  Pt will follow safe swallow strategies from MBS on 08/05/23 with modified independence in 2 sessions Baseline:  Goal status: INITIAL  3.  Pt  will tell SLP 3 overt s/sx aspiration PNA with modified independence  Baseline:  Goal status: Met   LONG TERM GOALS: Target date: 11/06/23  Pt will improve PROM from initial administration Baseline:  Goal status: INITIAL  2.  Pt will complete HEP with modified independence in 2 sessions Baseline:  Goal status: INITIAL  3.  Pt will follow safe swallow strategies from MBS on 08/05/23 in 3 sessions Baseline:  Goal status: INITIAL  4.  Pt will undergo follow up MBS by 11/15/23 to track progress and assess for safety of POs Baseline:  Goal status: INITIAL   PLAN:  SLP FREQUENCY: 2x/week  SLP DURATION: 8 weeks  PLANNED INTERVENTIONS: Aspiration precaution training, Pharyngeal strengthening exercises, Diet toleration management , SLP instruction and feedback, Compensatory strategies, Patient/family education, and repeat MBS    Rania Prothero, CCC-SLP 09/22/2023, 9:20 AM

## 2023-09-22 NOTE — Patient Instructions (Signed)
   Signs of Aspiration Pneumonia   Chest pain/tightness Fever (can be low grade) Cough  With foul-smelling phlegm (sputum) With sputum containing pus or blood With greenish sputum Fatigue  Shortness of breath  Wheezing   **IF YOU HAVE THESE SIGNS, CONTACT YOUR DOCTOR OR GO TO THE EMERGENCY DEPARTMENT OR URGENT CARE AS SOON AS POSSIBLE**     

## 2023-09-23 NOTE — Addendum Note (Signed)
Addended by: Willow Ora E on: 09/23/2023 04:26 PM   Modules accepted: Orders

## 2023-09-24 ENCOUNTER — Ambulatory Visit: Payer: Medicare HMO

## 2023-09-24 DIAGNOSIS — R1312 Dysphagia, oropharyngeal phase: Secondary | ICD-10-CM

## 2023-09-24 NOTE — Therapy (Addendum)
OUTPATIENT SPEECH LANGUAGE PATHOLOGY DYSPHAGIA TREATMENT   Patient Name: Matthew KENDRICK Sr. MRN: [id number] DOB:Mar 06, 1951, 72 y.o., male Today's Date: 10/08/2023  PCP: Willow Ora, MD  REFERRING PROVIDER: Iva Boop, MD  END OF SESSION:   End of Session - 09/24/23 1819       Visit Number 5    Number of Visits 17     Date for SLP Re-Evaluation 11/06/23     SLP Start Time 0802     SLP Stop Time  0819    SLP Time Calculation (min) 17 min     Activity Tolerance Patient tolerated treatment well       Past Medical History:  Diagnosis Date   AICD (automatic cardioverter/defibrillator) present 05/2008   St. Jude   Bradycardia    Use of beta blocker limited   CAD (coronary artery disease)    a- S/p cabg in march 2006 by Dr.Owen;  b. Celine Ahr 5/12: large IL scar from apex to base, no ischemia, EF 37%   Cancer (HCC)    on right side of neck   ED (erectile dysfunction)    Gout    Hyperlipidemia    Ischemic cardiomyopathy    a-Echocardiogram, Nov 2006, showed ejection fraction of 30-40% with mild to moderated mitral  regurgitation and mild aortic regurgitation b- Cardiac MRI, May 2007, EF of 44% with 50% scar involving  the inferolateral walls. No comment of mitral regurgitation. c- last echo  11/10: EF 30-35%  mod MR d- Nega T-Wave alternans testing in May of 2007 e- no ACE due to low BP;  f. CPX 3/11: normal   RBBB (right bundle branch block with left anterior fascicular block)    Systolic CHF, chronic (HCC)    Thyroid disease    Past Surgical History:  Procedure Laterality Date   CARDIAC DEFIBRILLATOR PLACEMENT  06/01/08   ICD   COLONOSCOPY     COLONOSCOPY WITH PROPOFOL N/A 07/28/2019   Procedure: COLONOSCOPY WITH PROPOFOL;  Surgeon: Iva Boop, MD;  Location: [facility];  Service: Endoscopy;  Laterality: N/A;   CORONARY ARTERY BYPASS GRAFT  02-2005   ICD GENERATOR CHANGEOUT N/A 02/03/2017   Procedure: ICD Generator Changeout;  Surgeon: Marinus Maw, MD;   Location: [facility] INVASIVE CV LAB;  Service: Cardiovascular;  Laterality: N/A;   ORIF ANKLE FRACTURE  1987   left    RIGHT/LEFT HEART CATH AND CORONARY/GRAFT ANGIOGRAPHY N/A 09/20/2019   Procedure: RIGHT/LEFT HEART CATH AND CORONARY/GRAFT ANGIOGRAPHY;  Surgeon: Dolores Patty, MD;  Location: [facility] INVASIVE CV LAB;  Service: Cardiovascular;  Laterality: N/A;   SHOULDER ARTHROSCOPY  02/2010   rt    TEE WITHOUT CARDIOVERSION N/A 10/24/2019   Procedure: TRANSESOPHAGEAL ECHOCARDIOGRAM (TEE);  Surgeon: Dolores Patty, MD;  Location: [facility] ENDOSCOPY;  Service: Cardiovascular;  Laterality: N/A;   TONSILLECTOMY     as a child   Patient Active Problem List   Diagnosis Date Noted   Presence of implantable cardioverter-defibrillator (ICD) 06/26/2022   VT (ventricular tachycardia) (HCC) 07/02/2021   Paroxysmal atrial fibrillation (HCC) 07/02/2021   Heme + stool    Internal and external prolapsed hemorrhoids    Difficulty swallowing 01/21/2018   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 [facility]) 12/24/2017   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 (HCC) 11/12/2017   PCP NOTES >>>>>>>>>>>>>>>>>>>>> 04/21/2016   Seborrheic dermatitis of scalp 04/11/2013   Annual physical exam 04/11/2011   MITRAL REGURGITATION 04/10/2010  SYSTOLIC HEART FAILURE, CHRONIC 10/03/2009   Hyperglycemia 06/19/2008   Coronary atherosclerosis 06/14/2008   Automatic implantable cardioverter-defibrillator in situ 06/01/2008   ERECTILE DYSFUNCTION 12/17/2007   Hyperlipidemia 02/06/2007   Gout 02/06/2007   RBBB 02/06/2007   Congestive heart failure (HCC) 02/06/2007   CORONARY ARTERY BYPASS GRAFT, FOUR VESSEL, HX OF 02/12/2005    ONSET DATE: 2019   REFERRING DIAG:  R13.19 (ICD-10-CM) - Esophageal dysphagia    THERAPY DIAG:  Dysphagia, oropharyngeal phase  Rationale for Evaluation and Treatment: Rehabilitation  SUBJECTIVE:   SUBJECTIVE  STATEMENT: "It's going down a lot easier now. To think four weeks ago I was choking." Pt accompanied by: self  PERTINENT HISTORY: He is five years out from completion of his chemoradiation therapy. 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 Remote ischemic cardiomyopathy status post bypass surgery, defib placement. Hyoerlipidemia, gout, hypothyroidism. Ankle fx.   PAIN:  Are you having pain? No  FALLS: Has patient fallen in last 6 months?  No  PATIENT GOALS: Improve swallowing status  OBJECTIVE:   DIAGNOSTIC FINDINGS: MBS 08/05/23: Pt demonstrates a moderate oropharyngeal dyspahgia likely related to late effects of radiation induced fibrosis to muscles of swallowing. Pt observed to have decreased hyoid excursion and epiglottic deflection primarily. Pt has piecemeal swallowing of liquids with good ability to sustain laryngeal elevationa nd vestibular closure with multiple propulsive efforts to push past vallecular space. There is base of tongue retration. and pharyngeal contraction, but they could be stronger to overcome stiffness in pharyngeal structures. Most residue accumulates the in the valleculae; pt uses a liquid rinse with solids and there is trace silent aspiration of thin residue throughout. Pt sensed residue, but not aspiration. Best methods were a chin tuck with solids and liquids which incraesed epigltotic defection. Also intermittent throat clearing. Pt recommended to continue a soft/moist regular diet and thin liquids but with added f/u with OP SLP to initiate a Home exercise program and train chin tuck and throat clearing. Pt also recommended to use strict oral hygiene to reduce bacterial load. Pt at risk of aspiration into the future; mobility is helpful and swallowing therapy may help pt maintain function. Factors that may increase risk of adverse event in presence of aspiration [name] &  [name] 2021):  Frequent aspiration of large volumes;Aspiration of thick, dense, and/or acidic materials   Swallow Evaluation Recommendations Recommendations: PO diet PO Diet Recommendation: Dysphagia 3 (Mechanical soft);Thin liquids (Level 0);Regular Liquid Administration via: Cup;Straw Medication Administration: Crushed with puree Swallowing strategies  : Chin tuck;Clear throat intermittently Postural changes: Position pt fully upright for meals Oral care recommendations: Oral care QID (4x/day)  Esophagram 07/23/23 - No definite mass or stricture is noted in the esophagus. Small sliding-type hiatal hernia is noted. No definite reflux is noted. Barium tablet passed through esophagus and into stomach without significant difficulty or delay.   CLINICAL SWALLOW ASSESSMENT:   Current diet: regular, Dysphagia 3 (mechanical soft), and thin liquids Objective swallow impairments: see "diagnostic findings" above Objective recommended compensations: see "diagnostic findings" above Dentition: adequate natural dentition Patient directly observed with POs: Yes: dysphagia 3 (soft) and thin liquids  Feeding: able to feed self Liquids provided by: cup Oral phase signs and symptoms:  none noted Pharyngeal phase signs and symptoms: multiple swallows, complaints of residue, and wiped nose with one swallow H2O   PATIENT REPORTED OUTCOME MEASURES (PROM): EAT-10: returned on 09/15/23 with score of 32/40 (higher numbers indicate  worse QOL/more challenge with eating).  TODAY'S TREATMENT:                                                                                                                                         DATE: 09/24/23: Pt followed precautions with self correction x1. No cues necessary from SLP.  He completed HEP with independence. SLP educated pt that retracting and protruding tongue between reps of Masako was allowed. He cont to require drops of water intermittently between swallows.    09/22/23: "I can tell a big difference." Pt reports he is able to eat everything easier than he was prior to initiation of ST. Another example is he had chicken last night and was able to feel it pass through pharynx; Pt stated, "It used to always get hung." With POS, pt followed precautions but SLP suggested pt clear this throat x3-4 more during POs. One instance of "wet" voice which pt cleared with immediate spontaneous throat clear/reswallow. With HEP, pt was modified independent.   09/15/23: Pt reports Shaker is challenging - SLP encouraged pt to cont to perform this and he will ultimately get to 45 seconds. Pt completed HEP today with min A initially for Masako for tongue protrusion, and Mendelsohn for hold at top of thyroid excursion without assistance from hand -pt was independent by session end; Other exercises were completed with independence. Pt was told he will need to cont to perform this HEP for at least 8-10 weeks to maximize effects.  09/14/23: See "s" above: pt having less coughing during meals using safe swallow strategies. Today with POs, pt used chin tuck spontaneously, needed initial min A to use throat clear voluntarily, but independent after first cue.  SLP educated pt on remainder of HEP - pt req'd model and occasional min A faded to independent by session end.  09/08/23 (eval): Research states the risk for dysphagia increases due to chemoradiation treatment due to a variety of factors, so SLP educated the pt about possible changes to swallowing musculature after rad tx, and why adherence to dysphagia HEP provided today was necessary to treat/minimize the effects of muscle fibrosis following rad tx. SLP informed pt why this would be detrimental to their swallowing status and to their pulmonary health. Pt demonstrated understanding of these things to SLP.  SLP then developed an individualized HEP for pt involving oral and pharyngeal musculature and pt was instructed how to perform these  exercises, including SLP demonstration. Effortful swallow was taught to pt today but pt will also be taught Clair Gulling, Masako, tongue press, and Shaker and/or chin tuck against resistance. After SLP demonstration, pt return demonstrated effortful swallow. SLP ensured pt performance was correct prior to educating pt on next exercise. Pt required min cues faded to modified independent to perform effortful swallow today. Pt was instructed to complete this program 6 days/week, 30 reps/day in sets of at least 5-10, for 8-12  weeks, and then x2-3 a week after that, indefinitely   PATIENT EDUCATION: Education details: see "today's treatment" above Person educated: Patient Education method: Explanation, Demonstration, and Verbal cues Education comprehension: verbalized understanding, returned demonstration, verbal cues required, and needs further education   ASSESSMENT:  CLINICAL IMPRESSION: Patient is a 72 y.o. M who was seen today for treatment of swallowing following radiation therapy in 2019. Pt has not had any ST to date. See above in "today's treatment" for more details. No oral deficits were noted today. Pt will cont to need to be seen by SLP in order to assess safety of PO intake, assess the need for recommending any objective swallow assessment, and ensuring pt is correctly completing the individualized HEP. He and SLP agreed he could decr to once/week due to progress.  OBJECTIVE IMPAIRMENTS: include dysphagia. These impairments are limiting patient from safety when swallowing. Factors affecting potential to achieve goals and functional outcome are severity of impairments. Patient will benefit from skilled SLP services to address above impairments and improve overall function.  REHAB POTENTIAL: Good   GOALS: Goals reviewed with patient? Yes  SHORT TERM GOALS: Target date: 10/09/23  Pt will complete HEP with rare min A in 2 sessions Baseline: 09/22/23 Goal status: met  2.  Pt will follow  safe swallow strategies from MBS on 08/05/23 with modified independence in 2 sessions Baseline: 09/24/23 Goal status: INITIAL  3.  Pt will tell SLP 3 overt s/sx aspiration PNA with modified independence  Baseline:  Goal status: Met   LONG TERM GOALS: Target date: 11/06/23  Pt will improve PROM from initial administration Baseline:  Goal status: INITIAL  2.  Pt will complete HEP with modified independence in 2 sessions Baseline: 09/24/23 Goal status: INITIAL  3.  Pt will follow safe swallow strategies from MBS on 08/05/23 in 3 sessions Baseline: 09/24/23 Goal status: INITIAL  4.  Pt will undergo follow up MBS by 11/15/23 to track progress and assess for safety of POs Baseline:  Goal status: INITIAL   PLAN:  SLP FREQUENCY: 1x/week  SLP DURATION: 8 weeks  PLANNED INTERVENTIONS: Aspiration precaution training, Pharyngeal strengthening exercises, Diet toleration management , SLP instruction and feedback, Compensatory strategies, Patient/family education, and repeat MBS    Naviyah Schaffert, CCC-SLP 10/08/2023, 7:11 AM

## 2023-09-29 ENCOUNTER — Ambulatory Visit: Payer: Medicare HMO

## 2023-10-02 ENCOUNTER — Ambulatory Visit: Payer: Medicare HMO

## 2023-10-06 ENCOUNTER — Ambulatory Visit: Payer: Medicare HMO

## 2023-10-08 ENCOUNTER — Ambulatory Visit: Payer: Medicare HMO

## 2023-10-08 DIAGNOSIS — R1312 Dysphagia, oropharyngeal phase: Secondary | ICD-10-CM

## 2023-10-08 NOTE — Therapy (Signed)
OUTPATIENT SPEECH LANGUAGE PATHOLOGY DYSPHAGIA TREATMENT   Patient Name: Matthew SODERBERG Sr. MRN: [id number] DOB:1951/12/13, 72 y.o., male Today's Date: 10/08/2023  PCP: Willow Ora, MD  REFERRING PROVIDER: Iva Boop, MD  END OF SESSION:  End of Session - 10/08/23 1011     Visit Number 6    Number of Visits 17    Date for SLP Re-Evaluation 11/06/23    SLP Start Time 0803    SLP Stop Time  0833    SLP Time Calculation (min) 30 min    Activity Tolerance Patient tolerated treatment well                Past Medical History:  Diagnosis Date   AICD (automatic cardioverter/defibrillator) present 05/2008   St. Jude   Bradycardia    Use of beta blocker limited   CAD (coronary artery disease)    a- S/p cabg in march 2006 by Dr.Owen;  b. Celine Ahr 5/12: large IL scar from apex to base, no ischemia, EF 37%   Cancer (HCC)    on right side of neck   ED (erectile dysfunction)    Gout    Hyperlipidemia    Ischemic cardiomyopathy    a-Echocardiogram, Nov 2006, showed ejection fraction of 30-40% with mild to moderated mitral  regurgitation and mild aortic regurgitation b- Cardiac MRI, May 2007, EF of 44% with 50% scar involving  the inferolateral walls. No comment of mitral regurgitation. c- last echo  11/10: EF 30-35%  mod MR d- Nega T-Wave alternans testing in May of 2007 e- no ACE due to low BP;  f. CPX 3/11: normal   RBBB (right bundle branch block with left anterior fascicular block)    Systolic CHF, chronic (HCC)    Thyroid disease    Past Surgical History:  Procedure Laterality Date   CARDIAC DEFIBRILLATOR PLACEMENT  06/01/08   ICD   COLONOSCOPY     COLONOSCOPY WITH PROPOFOL N/A 07/28/2019   Procedure: COLONOSCOPY WITH PROPOFOL;  Surgeon: Iva Boop, MD;  Location: [facility] ENDOSCOPY;  Service: Endoscopy;  Laterality: N/A;   CORONARY ARTERY BYPASS GRAFT  02-2005   ICD GENERATOR CHANGEOUT N/A 02/03/2017   Procedure: ICD Generator Changeout;  Surgeon: Marinus Maw, MD;   Location: [facility] INVASIVE CV LAB;  Service: Cardiovascular;  Laterality: N/A;   ORIF ANKLE FRACTURE  1987   left    RIGHT/LEFT HEART CATH AND CORONARY/GRAFT ANGIOGRAPHY N/A 09/20/2019   Procedure: RIGHT/LEFT HEART CATH AND CORONARY/GRAFT ANGIOGRAPHY;  Surgeon: Dolores Patty, MD;  Location: [facility] INVASIVE CV LAB;  Service: Cardiovascular;  Laterality: N/A;   SHOULDER ARTHROSCOPY  02/2010   rt    TEE WITHOUT CARDIOVERSION N/A 10/24/2019   Procedure: TRANSESOPHAGEAL ECHOCARDIOGRAM (TEE);  Surgeon: Dolores Patty, MD;  Location: [facility] ENDOSCOPY;  Service: Cardiovascular;  Laterality: N/A;   TONSILLECTOMY     as a child   Patient Active Problem List   Diagnosis Date Noted   Presence of implantable cardioverter-defibrillator (ICD) 06/26/2022   VT (ventricular tachycardia) (HCC) 07/02/2021   Paroxysmal atrial fibrillation (HCC) 07/02/2021   Heme + stool    Internal and external prolapsed hemorrhoids    Difficulty swallowing 01/21/2018   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 [facility]) 12/24/2017   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 (HCC) 11/12/2017   PCP NOTES >>>>>>>>>>>>>>>>>>>>> 04/21/2016   Seborrheic dermatitis of scalp 04/11/2013   Annual physical exam 04/11/2011   MITRAL REGURGITATION  04/10/2010   SYSTOLIC HEART FAILURE, CHRONIC 10/03/2009   Hyperglycemia 06/19/2008   Coronary atherosclerosis 06/14/2008   Automatic implantable cardioverter-defibrillator in situ 06/01/2008   ERECTILE DYSFUNCTION 12/17/2007   Hyperlipidemia 02/06/2007   Gout 02/06/2007   RBBB 02/06/2007   Congestive heart failure (HCC) 02/06/2007   CORONARY ARTERY BYPASS GRAFT, FOUR VESSEL, HX OF 02/12/2005    ONSET DATE: 2019   REFERRING DIAG:  R13.19 (ICD-10-CM) - Esophageal dysphagia    THERAPY DIAG:  Dysphagia, oropharyngeal phase  Rationale for Evaluation and Treatment: Rehabilitation  SUBJECTIVE:   SUBJECTIVE  STATEMENT: "Very rarely now do I have trouble getting something down." Pt accompanied by: self  PERTINENT HISTORY: He is five years out from completion of his chemoradiation therapy. 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 Remote ischemic cardiomyopathy status post bypass surgery, defib placement. Hyoerlipidemia, gout, hypothyroidism. Ankle fx.   PAIN:  Are you having pain? No  FALLS: Has patient fallen in last 6 months?  No  PATIENT GOALS: Improve swallowing status  OBJECTIVE:   DIAGNOSTIC FINDINGS: MBS 08/05/23: Pt demonstrates a moderate oropharyngeal dyspahgia likely related to late effects of radiation induced fibrosis to muscles of swallowing. Pt observed to have decreased hyoid excursion and epiglottic deflection primarily. Pt has piecemeal swallowing of liquids with good ability to sustain laryngeal elevationa nd vestibular closure with multiple propulsive efforts to push past vallecular space. There is base of tongue retration. and pharyngeal contraction, but they could be stronger to overcome stiffness in pharyngeal structures. Most residue accumulates the in the valleculae; pt uses a liquid rinse with solids and there is trace silent aspiration of thin residue throughout. Pt sensed residue, but not aspiration. Best methods were a chin tuck with solids and liquids which incraesed epigltotic defection. Also intermittent throat clearing. Pt recommended to continue a soft/moist regular diet and thin liquids but with added f/u with OP SLP to initiate a Home exercise program and train chin tuck and throat clearing. Pt also recommended to use strict oral hygiene to reduce bacterial load. Pt at risk of aspiration into the future; mobility is helpful and swallowing therapy may help pt maintain function. Factors that may increase risk of adverse event in presence of aspiration [name] & [name] 2021):   Frequent aspiration of large volumes;Aspiration of thick, dense, and/or acidic materials   Swallow Evaluation Recommendations Recommendations: PO diet PO Diet Recommendation: Dysphagia 3 (Mechanical soft);Thin liquids (Level 0);Regular Liquid Administration via: Cup;Straw Medication Administration: Crushed with puree Swallowing strategies  : Chin tuck;Clear throat intermittently Postural changes: Position pt fully upright for meals Oral care recommendations: Oral care QID (4x/day)  Esophagram 07/23/23 - No definite mass or stricture is noted in the esophagus. Small sliding-type hiatal hernia is noted. No definite reflux is noted. Barium tablet passed through esophagus and into stomach without significant difficulty or delay.   CLINICAL SWALLOW ASSESSMENT:   Current diet: regular, Dysphagia 3 (mechanical soft), and thin liquids Objective swallow impairments: see "diagnostic findings" above Objective recommended compensations: see "diagnostic findings" above Dentition: adequate natural dentition Patient directly observed with POs: Yes: dysphagia 3 (soft) and thin liquids  Feeding: able to feed self Liquids provided by: cup Oral phase signs and symptoms:  none noted Pharyngeal phase signs and symptoms: multiple swallows, complaints of residue, and wiped nose with one swallow H2O   PATIENT REPORTED OUTCOME MEASURES (PROM): EAT-10: returned on 09/15/23 with score of 32/40 (higher numbers indicate worse QOL/more  challenge with eating).  TODAY'S TREATMENT:                                                                                                                                         DATE: 10/08/23: Pt told SLP in those rare times he forgets to use chin tuck it appears that pharyngeal clearance has improved. He continues to perform HEP but suboptimal rep numbers (6-10 reps/day of each). SLP provided some options for pt to increase rep number/day for each. Appears that Shaker is getting  completed as directed.  Today pt ate fig bar and drank water, following precautions, without any overt s/sx oral or pharyngeal deficits. SLP and pt agreed pt could be seen every other week or possibly every 4 weeks after  next visit.  09/24/23: Pt followed precautions with self correction x1. No cues necessary from SLP.  He completed HEP with independence. SLP educated pt that retracting and protruding tongue between reps of Masako was allowed. He cont to require drops of water intermittently between swallows.   09/22/23: "I can tell a big difference." Pt reports he is able to eat everything easier than he was prior to initiation of ST. Another example is he had chicken last night and was able to feel it pass through pharynx; Pt stated, "It used to always get hung." With POS, pt followed precautions but SLP suggested pt clear this throat x3-4 more during POs. One instance of "wet" voice which pt cleared with immediate spontaneous throat clear/reswallow. With HEP, pt was modified independent.   09/15/23: Pt reports Shaker is challenging - SLP encouraged pt to cont to perform this and he will ultimately get to 45 seconds. Pt completed HEP today with min A initially for Masako for tongue protrusion, and Mendelsohn for hold at top of thyroid excursion without assistance from hand -pt was independent by session end; Other exercises were completed with independence. Pt was told he will need to cont to perform this HEP for at least 8-10 weeks to maximize effects.  09/14/23: See "s" above: pt having less coughing during meals using safe swallow strategies. Today with POs, pt used chin tuck spontaneously, needed initial min A to use throat clear voluntarily, but independent after first cue.  SLP educated pt on remainder of HEP - pt req'd model and occasional min A faded to independent by session end.  09/08/23 (eval): Research states the risk for dysphagia increases due to chemoradiation treatment due to a variety  of factors, so SLP educated the pt about possible changes to swallowing musculature after rad tx, and why adherence to dysphagia HEP provided today was necessary to treat/minimize the effects of muscle fibrosis following rad tx. SLP informed pt why this would be detrimental to their swallowing status and to their pulmonary health. Pt demonstrated understanding of these things to SLP.  SLP then developed an individualized HEP for pt involving oral  and pharyngeal musculature and pt was instructed how to perform these exercises, including SLP demonstration. Effortful swallow was taught to pt today but pt will also be taught Clair Gulling, Masako, tongue press, and Shaker and/or chin tuck against resistance. After SLP demonstration, pt return demonstrated effortful swallow. SLP ensured pt performance was correct prior to educating pt on next exercise. Pt required min cues faded to modified independent to perform effortful swallow today. Pt was instructed to complete this program 6 days/week, 30 reps/day in sets of at least 5-10, for 8-12 weeks, and then x2-3 a week after that, indefinitely   PATIENT EDUCATION: Education details: see "today's treatment" above Person educated: Patient Education method: Explanation, Demonstration, and Verbal cues Education comprehension: verbalized understanding, returned demonstration, verbal cues required, and needs further education   ASSESSMENT:  CLINICAL IMPRESSION: Patient is a 72 y.o. M who was seen today for treatment of swallowing following radiation therapy in 2019. Pt has not had any ST prior to ST evaluation. See above in "today's treatment" for more details. No oral or overt s/sx pharyngeal deficits were noted today. Pt will cont to need to be seen by SLP in order to assess safety of PO intake, assess the need for recommending any objective swallow assessment, and ensuring pt is correctly completing the individualized HEP. He and SLP agreed he could decr to  once/every other week due to progress.  OBJECTIVE IMPAIRMENTS: include dysphagia. These impairments are limiting patient from safety when swallowing. Factors affecting potential to achieve goals and functional outcome are severity of impairments. Patient will benefit from skilled SLP services to address above impairments and improve overall function.  REHAB POTENTIAL: Good   GOALS: Goals reviewed with patient? Yes  SHORT TERM GOALS: Target date: 10/09/23  Pt will complete HEP with rare min A in 2 sessions Baseline: 09/22/23 Goal status: met  2.  Pt will follow safe swallow strategies from MBS on 08/05/23 with modified independence in 2 sessions Baseline: 09/24/23 Goal status: met  3.  Pt will tell SLP 3 overt s/sx aspiration PNA with modified independence  Baseline:  Goal status: Met   LONG TERM GOALS: Target date: 11/06/23  Pt will improve PROM from initial administration Baseline:  Goal status: INITIAL  2.  Pt will complete HEP with modified independence in 2 sessions Baseline: 09/24/23 Goal status: INITIAL  3.  Pt will follow safe swallow strategies from MBS on 08/05/23 in 3 sessions Baseline: 09/24/23 Goal status: INITIAL  4.  Pt will undergo follow up MBS by 11/15/23 to track progress and assess for safety of POs Baseline:  Goal status: INITIAL   PLAN:  SLP FREQUENCY: every other week  SLP DURATION: 8 weeks  PLANNED INTERVENTIONS: Aspiration precaution training, Pharyngeal strengthening exercises, Diet toleration management , SLP instruction and feedback, Compensatory strategies, Patient/family education, and repeat MBS    Laisa Larrick, CCC-SLP 10/08/2023, 10:11 AM

## 2023-10-13 ENCOUNTER — Ambulatory Visit: Payer: Medicare HMO

## 2023-10-16 ENCOUNTER — Telehealth: Payer: Self-pay | Admitting: Internal Medicine

## 2023-10-16 NOTE — Telephone Encounter (Signed)
Copied from CRM [id number]. Topic: Medicare AWV >> Oct 16, 2023 10:04 AM Payton Doughty wrote: Reason for CRM: Called LVM 10/16/2023 to schedule Annual Wellness Visit  Verlee Rossetti; Care Guide Ambulatory Clinical Support [place] [facility] Direct Dial: [phone]

## 2023-10-20 ENCOUNTER — Ambulatory Visit (INDEPENDENT_AMBULATORY_CARE_PROVIDER_SITE_OTHER): Payer: Medicare HMO | Admitting: Internal Medicine

## 2023-10-20 ENCOUNTER — Encounter: Payer: Self-pay | Admitting: Internal Medicine

## 2023-10-20 ENCOUNTER — Ambulatory Visit: Payer: Medicare HMO | Attending: Internal Medicine

## 2023-10-20 VITALS — BP 116/64 | HR 45 | Temp 98.2°F | Resp 16 | Ht 69.0 in | Wt 148.1 lb

## 2023-10-20 DIAGNOSIS — R1313 Dysphagia, pharyngeal phase: Secondary | ICD-10-CM

## 2023-10-20 DIAGNOSIS — Z0000 Encounter for general adult medical examination without abnormal findings: Secondary | ICD-10-CM | POA: Diagnosis not present

## 2023-10-20 DIAGNOSIS — M109 Gout, unspecified: Secondary | ICD-10-CM

## 2023-10-20 DIAGNOSIS — R7989 Other specified abnormal findings of blood chemistry: Secondary | ICD-10-CM

## 2023-10-20 DIAGNOSIS — I5022 Chronic systolic (congestive) heart failure: Secondary | ICD-10-CM | POA: Diagnosis not present

## 2023-10-20 DIAGNOSIS — Z0001 Encounter for general adult medical examination with abnormal findings: Secondary | ICD-10-CM

## 2023-10-20 DIAGNOSIS — R1312 Dysphagia, oropharyngeal phase: Secondary | ICD-10-CM | POA: Diagnosis not present

## 2023-10-20 DIAGNOSIS — R739 Hyperglycemia, unspecified: Secondary | ICD-10-CM | POA: Diagnosis not present

## 2023-10-20 DIAGNOSIS — E785 Hyperlipidemia, unspecified: Secondary | ICD-10-CM

## 2023-10-20 NOTE — Assessment & Plan Note (Signed)
Here for CPX Diabetes: On Farxiga, check A1c.  Further advised for results Hyperlipidemia: Well-controlled per last FLP History of gout, no recent events, check a uric acid Thyroid disease: On Synthroid, check labs. Dysphagia: 07/01/2023, saw GI, Dx esophageal dysphagia >> 08/05/2023, modified barium swallow, aspiration noted, undergoing SP  therapy, feels better!  VT, heart failure, CAD, paroxysmal A-fib: 08/10/2023, saw cardiology, stable, was not recommended to have VT ablation RTC 6 months

## 2023-10-20 NOTE — Assessment & Plan Note (Signed)
Here for CPX - Td 04/2017 - pneumonia shot 2009 and 2016, Prevnar 2015.  PNM 20: 2024 - zostavax 2014; s/p shingrex x 2 -Had a flu shot. - Recommend a COVID-vaccine, RSV.    --CCS: 01-2008: Cscope, tics, no polyps; + IFOB  06/2019, cscope 07/2019 no polyps, + hemorrhoids, no further CCS per cscope report --Prostate cancer screening: No symptoms, check PSA.Marland Kitchen --Diet and exercise: Exercises regularly. --labs:BMP TSH -free T4 -A1c- uric acid- PSA -Healthcare POA: See AVS

## 2023-10-20 NOTE — Patient Instructions (Signed)
   Signs of Aspiration Pneumonia   Chest pain/tightness Fever (can be low grade) Cough  With foul-smelling phlegm (sputum) With sputum containing pus or blood With greenish sputum Fatigue  Shortness of breath  Wheezing   **IF YOU HAVE THESE SIGNS, CONTACT YOUR DOCTOR OR GO TO THE EMERGENCY DEPARTMENT OR URGENT CARE AS SOON AS POSSIBLE**     

## 2023-10-20 NOTE — Therapy (Signed)
OUTPATIENT SPEECH LANGUAGE PATHOLOGY DYSPHAGIA TREATMENT   Patient Name: Matthew BORIN Sr. MRN: [id number] DOB:01/27/1951, 72 y.o., male Today's Date: 10/20/2023  PCP: Willow Ora, MD  REFERRING PROVIDER: Iva Boop, MD  END OF SESSION:  End of Session - 10/20/23 0930     Visit Number 7    Number of Visits 17    Date for SLP Re-Evaluation 11/06/23    SLP Start Time 0805    SLP Stop Time  0837    SLP Time Calculation (min) 32 min    Activity Tolerance Patient tolerated treatment well                 Past Medical History:  Diagnosis Date   AICD (automatic cardioverter/defibrillator) present 05/2008   St. Jude   Bradycardia    Use of beta blocker limited   CAD (coronary artery disease)    a- S/p cabg in march 2006 by Dr.Owen;  b. myoview 5/12: large IL scar from apex to base, no ischemia, EF 37%   Cancer (HCC)    on right side of neck   ED (erectile dysfunction)    Gout    Hyperlipidemia    Ischemic cardiomyopathy    a-Echocardiogram, Nov 2006, showed ejection fraction of 30-40% with mild to moderated mitral  regurgitation and mild aortic regurgitation b- Cardiac MRI, May 2007, EF of 44% with 50% scar involving  the inferolateral walls. No comment of mitral regurgitation. c- last echo  11/10: EF 30-35%  mod MR d- Nega T-Wave alternans testing in May of 2007 e- no ACE due to low BP;  f. CPX 3/11: normal   RBBB (right bundle branch block with left anterior fascicular block)    Systolic CHF, chronic (HCC)    Thyroid disease    Past Surgical History:  Procedure Laterality Date   CARDIAC DEFIBRILLATOR PLACEMENT  06/01/08   ICD   COLONOSCOPY     COLONOSCOPY WITH PROPOFOL N/A 07/28/2019   Procedure: COLONOSCOPY WITH PROPOFOL;  Surgeon: Iva Boop, MD;  Location: [facility];  Service: Endoscopy;  Laterality: N/A;   CORONARY ARTERY BYPASS GRAFT  02-2005   ICD GENERATOR CHANGEOUT N/A 02/03/2017   Procedure: ICD Generator Changeout;  Surgeon: Marinus Maw, MD;   Location: [facility] INVASIVE CV LAB;  Service: Cardiovascular;  Laterality: N/A;   ORIF ANKLE FRACTURE  1987   left    RIGHT/LEFT HEART CATH AND CORONARY/GRAFT ANGIOGRAPHY N/A 09/20/2019   Procedure: RIGHT/LEFT HEART CATH AND CORONARY/GRAFT ANGIOGRAPHY;  Surgeon: Dolores Patty, MD;  Location: [facility] INVASIVE CV LAB;  Service: Cardiovascular;  Laterality: N/A;   SHOULDER ARTHROSCOPY  02/2010   rt    TEE WITHOUT CARDIOVERSION N/A 10/24/2019   Procedure: TRANSESOPHAGEAL ECHOCARDIOGRAM (TEE);  Surgeon: Dolores Patty, MD;  Location: [facility] ENDOSCOPY;  Service: Cardiovascular;  Laterality: N/A;   TONSILLECTOMY     as a child   Patient Active Problem List   Diagnosis Date Noted   Presence of implantable cardioverter-defibrillator (ICD) 06/26/2022   VT (ventricular tachycardia) (HCC) 07/02/2021   Paroxysmal atrial fibrillation (HCC) 07/02/2021   Heme + stool    Internal and external prolapsed hemorrhoids    Difficulty swallowing 01/21/2018   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 [facility]) 12/24/2017   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 (HCC) 11/12/2017   PCP NOTES >>>>>>>>>>>>>>>>>>>>> 04/21/2016   Seborrheic dermatitis of scalp 04/11/2013   Annual physical exam 04/11/2011   MITRAL  REGURGITATION 04/10/2010   SYSTOLIC HEART FAILURE, CHRONIC 10/03/2009   Hyperglycemia 06/19/2008   Coronary atherosclerosis 06/14/2008   Automatic implantable cardioverter-defibrillator in situ 06/01/2008   ERECTILE DYSFUNCTION 12/17/2007   Hyperlipidemia 02/06/2007   Gout 02/06/2007   RBBB 02/06/2007   Congestive heart failure (HCC) 02/06/2007   CORONARY ARTERY BYPASS GRAFT, FOUR VESSEL, HX OF 02/12/2005    ONSET DATE: 2019   REFERRING DIAG:  R13.19 (ICD-10-CM) - Esophageal dysphagia    THERAPY DIAG:  No diagnosis found.  Rationale for Evaluation and Treatment: Rehabilitation  SUBJECTIVE:   SUBJECTIVE STATEMENT: "Doing  real well, almost anything I eat, I can get down and I don't have to chase anything down." Pt accompanied by: self  PERTINENT HISTORY: He is five years out from completion of his chemoradiation therapy. 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 Remote ischemic cardiomyopathy status post bypass surgery, defib placement. Hyoerlipidemia, gout, hypothyroidism. Ankle fx.   PAIN:  Are you having pain? No  FALLS: Has patient fallen in last 6 months?  No  PATIENT GOALS: Improve swallowing status  OBJECTIVE:   DIAGNOSTIC FINDINGS: MBS 08/05/23: Pt demonstrates a moderate oropharyngeal dyspahgia likely related to late effects of radiation induced fibrosis to muscles of swallowing. Pt observed to have decreased hyoid excursion and epiglottic deflection primarily. Pt has piecemeal swallowing of liquids with good ability to sustain laryngeal elevationa nd vestibular closure with multiple propulsive efforts to push past vallecular space. There is base of tongue retration. and pharyngeal contraction, but they could be stronger to overcome stiffness in pharyngeal structures. Most residue accumulates the in the valleculae; pt uses a liquid rinse with solids and there is trace silent aspiration of thin residue throughout. Pt sensed residue, but not aspiration. Best methods were a chin tuck with solids and liquids which incraesed epigltotic defection. Also intermittent throat clearing. Pt recommended to continue a soft/moist regular diet and thin liquids but with added f/u with OP SLP to initiate a Home exercise program and train chin tuck and throat clearing. Pt also recommended to use strict oral hygiene to reduce bacterial load. Pt at risk of aspiration into the future; mobility is helpful and swallowing therapy may help pt maintain function. Factors that may increase risk of adverse event in presence of aspiration [name] &  [name] 2021):  Frequent aspiration of large volumes;Aspiration of thick, dense, and/or acidic materials   Swallow Evaluation Recommendations Recommendations: PO diet PO Diet Recommendation: Dysphagia 3 (Mechanical soft);Thin liquids (Level 0);Regular Liquid Administration via: Cup;Straw Medication Administration: Crushed with puree Swallowing strategies  : Chin tuck;Clear throat intermittently Postural changes: Position pt fully upright for meals Oral care recommendations: Oral care QID (4x/day)  Esophagram 07/23/23 - No definite mass or stricture is noted in the esophagus. Small sliding-type hiatal hernia is noted. No definite reflux is noted. Barium tablet passed through esophagus and into stomach without significant difficulty or delay.   CLINICAL SWALLOW ASSESSMENT:   Current diet: regular, Dysphagia 3 (mechanical soft), and thin liquids Objective swallow impairments: see "diagnostic findings" above Objective recommended compensations: see "diagnostic findings" above Dentition: adequate natural dentition Patient directly observed with POs: Yes: dysphagia 3 (soft) and thin liquids  Feeding: able to feed self Liquids provided by: cup Oral phase signs and symptoms:  none noted Pharyngeal phase signs and symptoms: multiple swallows, complaints of residue, and wiped nose with one swallow H2O   PATIENT REPORTED OUTCOME MEASURES (PROM): EAT-10: returned on  09/15/23 with score of 32/40 (higher numbers indicate worse QOL/more challenge with eating).  TODAY'S TREATMENT:                                                                                                                                         DATE: 10/20/23: SLP and pt to discuss if Yashas would like to pursue f/u MBS at this time. Pt is not always completing chin tuck with liquids due to what he considers an improvement in swallow function (see "S"). SLP explained silent aspiration and that lack of cough is not a good indicator that  he does or does not have aspiration with liquids at this time - that another study is necessary to rule this out. He took the overt s/sx of aspiration PNA again for perusal.  With HEP, pt was independent. Pt req'd initial cue for chin tuck, but was independent with precautions after that. SLP and pt agreed pt could be seen in 4 weeks and possible d/c then. . 10/08/23: Pt told SLP in those rare times he forgets to use chin tuck it appears that pharyngeal clearance has improved. He continues to perform HEP but suboptimal rep numbers (6-10 reps/day of each). SLP provided some options for pt to increase rep number/day for each. Appears that Shaker is getting completed as directed.  Today pt ate fig bar and drank water, following precautions, without any overt s/sx oral or pharyngeal deficits. SLP and pt agreed pt could be seen every other week or possibly every 4 weeks after next visit.  09/24/23: Pt followed precautions with self correction x1. No cues necessary from SLP.  He completed HEP with independence. SLP educated pt that retracting and protruding tongue between reps of Masako was allowed. He cont to require drops of water intermittently between swallows.   09/22/23: "I can tell a big difference." Pt reports he is able to eat everything easier than he was prior to initiation of ST. Another example is he had chicken last night and was able to feel it pass through pharynx; Pt stated, "It used to always get hung." With POs, pt followed precautions but SLP suggested pt clear this throat x3-4 more during POs. One instance of "wet" voice which pt cleared with immediate spontaneous throat clear/reswallow. With HEP, pt was modified independent.   09/15/23: Pt reports Shaker is challenging - SLP encouraged pt to cont to perform this and he will ultimately get to 45 seconds. Pt completed HEP today with min A initially for Masako for tongue protrusion, and Mendelsohn for hold at top of thyroid excursion without  assistance from hand -pt was independent by session end; Other exercises were completed with independence. Pt was told he will need to cont to perform this HEP for at least 8-10 weeks to maximize effects.  09/14/23: See "s" above: pt having less coughing during meals using safe swallow strategies. Today with POs,  pt used chin tuck spontaneously, needed initial min A to use throat clear voluntarily, but independent after first cue.  SLP educated pt on remainder of HEP - pt req'd model and occasional min A faded to independent by session end.  09/08/23 (eval): Research states the risk for dysphagia increases due to chemoradiation treatment due to a variety of factors, so SLP educated the pt about possible changes to swallowing musculature after rad tx, and why adherence to dysphagia HEP provided today was necessary to treat/minimize the effects of muscle fibrosis following rad tx. SLP informed pt why this would be detrimental to their swallowing status and to their pulmonary health. Pt demonstrated understanding of these things to SLP.  SLP then developed an individualized HEP for pt involving oral and pharyngeal musculature and pt was instructed how to perform these exercises, including SLP demonstration. Effortful swallow was taught to pt today but pt will also be taught Clair Gulling, Masako, tongue press, and Shaker and/or chin tuck against resistance. After SLP demonstration, pt return demonstrated effortful swallow. SLP ensured pt performance was correct prior to educating pt on next exercise. Pt required min cues faded to modified independent to perform effortful swallow today. Pt was instructed to complete this program 6 days/week, 30 reps/day in sets of at least 5-10, for 8-12 weeks, and then x2-3 a week after that, indefinitely   PATIENT EDUCATION: Education details: see "today's treatment" above Person educated: Patient Education method: Explanation, Demonstration, and Verbal cues Education  comprehension: verbalized understanding, returned demonstration, verbal cues required, and needs further education   ASSESSMENT:  CLINICAL IMPRESSION: Patient is a 72 y.o. M who was seen today for treatment of swallowing following radiation therapy in 2019. See above in "today's treatment" for more details. No oral or overt s/sx pharyngeal deficits were noted today. Pt will cont to need to be seen by SLP in order to assess safety of PO intake, assess the need for recommending any objective swallow assessment, and ensuring pt is correctly completing the individualized HEP. He and SLP agreed he could decr to once every four weeks with possible d/c next session.  OBJECTIVE IMPAIRMENTS: include dysphagia. These impairments are limiting patient from safety when swallowing. Factors affecting potential to achieve goals and functional outcome are severity of impairments. Patient will benefit from skilled SLP services to address above impairments and improve overall function.  REHAB POTENTIAL: Good   GOALS: Goals reviewed with patient? Yes  SHORT TERM GOALS: Target date: 10/09/23  Pt will complete HEP with rare min A in 2 sessions Baseline: 09/22/23 Goal status: met  2.  Pt will follow safe swallow strategies from MBS on 08/05/23 with modified independence in 2 sessions Baseline: 09/24/23 Goal status: met  3.  Pt will tell SLP 3 overt s/sx aspiration PNA with modified independence  Baseline:  Goal status: Met   LONG TERM GOALS: Target date: 11/06/23  Pt will improve PROM from initial administration Baseline:  Goal status: INITIAL  2.  Pt will complete HEP with modified independence in 2 sessions Baseline: 09/24/23 Goal status: Met  3.  Pt will follow safe swallow strategies from MBS on 08/05/23 in 3 sessions Baseline: 09/24/23 Goal status: INITIAL  4.  Pt will undergo follow up MBS by 11/15/23 to track progress and assess for safety of POs Baseline:  Goal status:  INITIAL   PLAN:  SLP FREQUENCY: every other week  SLP DURATION: 8 weeks  PLANNED INTERVENTIONS: Aspiration precaution training, Pharyngeal strengthening exercises, Diet toleration management , SLP instruction  and feedback, Compensatory strategies, Patient/family education, and repeat MBS    Shamanda Len, CCC-SLP 10/20/2023, 9:30 AM

## 2023-10-20 NOTE — Progress Notes (Signed)
Subjective:    Patient ID: Matthew Paris Sr., male    DOB: [birth year], 72 y.o.   MRN: [id number]  DOS:  10/20/2023 Type of visit - description: CPX  Here for CPX Saw cardiology and GI, notes reviewed. Denies chest pain or difficulty breathing.  No palpitation. Had dysphagia, that has improved. No recent gout episodes.   Wt Readings from Last 3 Encounters:  10/20/23 148 lb 2 oz (67.2 kg)  08/10/23 148 lb (67.1 kg)  07/01/23 145 lb 6 oz (65.9 kg)     Review of Systems   Other than above, a 14 point review of systems is negative     Past Medical History:  Diagnosis Date   AICD (automatic cardioverter/defibrillator) present 05/2008   St. Jude   Bradycardia    Use of beta blocker limited   CAD (coronary artery disease)    a- S/p cabg in march 2006 by Dr.Owen;  b. Celine Ahr 5/12: large IL scar from apex to base, no ischemia, EF 37%   Cancer (HCC)    on right side of neck   ED (erectile dysfunction)    Gout    Hyperlipidemia    Ischemic cardiomyopathy    a-Echocardiogram, Nov 2006, showed ejection fraction of 30-40% with mild to moderated mitral  regurgitation and mild aortic regurgitation b- Cardiac MRI, May 2007, EF of 44% with 50% scar involving  the inferolateral walls. No comment of mitral regurgitation. c- last echo  11/10: EF 30-35%  mod MR d- Nega T-Wave alternans testing in May of 2007 e- no ACE due to low BP;  f. CPX 3/11: normal   RBBB (right bundle branch block with left anterior fascicular block)    Systolic CHF, chronic (HCC)    Thyroid disease     Past Surgical History:  Procedure Laterality Date   CARDIAC DEFIBRILLATOR PLACEMENT  06/01/08   ICD   COLONOSCOPY     COLONOSCOPY WITH PROPOFOL N/A 07/28/2019   Procedure: COLONOSCOPY WITH PROPOFOL;  Surgeon: Iva Boop, MD;  Location: [facility] ENDOSCOPY;  Service: Endoscopy;  Laterality: N/A;   CORONARY ARTERY BYPASS GRAFT  02-2005   ICD GENERATOR CHANGEOUT N/A 02/03/2017   Procedure: ICD Generator Changeout;   Surgeon: Marinus Maw, MD;  Location: [facility] INVASIVE CV LAB;  Service: Cardiovascular;  Laterality: N/A;   ORIF ANKLE FRACTURE  1987   left    RIGHT/LEFT HEART CATH AND CORONARY/GRAFT ANGIOGRAPHY N/A 09/20/2019   Procedure: RIGHT/LEFT HEART CATH AND CORONARY/GRAFT ANGIOGRAPHY;  Surgeon: Dolores Patty, MD;  Location: [facility] INVASIVE CV LAB;  Service: Cardiovascular;  Laterality: N/A;   SHOULDER ARTHROSCOPY  02/2010   rt    TEE WITHOUT CARDIOVERSION N/A 10/24/2019   Procedure: TRANSESOPHAGEAL ECHOCARDIOGRAM (TEE);  Surgeon: Dolores Patty, MD;  Location: [facility] ENDOSCOPY;  Service: Cardiovascular;  Laterality: N/A;   TONSILLECTOMY     as a child   Social History   Socioeconomic History   Marital status: Married    Spouse name: Not on file   Number of children: 2   Years of education: Not on file   Highest education level: Bachelor's degree (e.g., BA, AB, BS)  Occupational History   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     Employer: [employer]  Tobacco Use   Smoking status: Former    Current packs/day: 0.00    Types: Cigarettes    Quit date: 12/16/1999    Years since quitting: 23.8   Smokeless tobacco:  Never  Vaping Use   Vaping status: Never Used  Substance and Sexual Activity   Alcohol use: Not Currently   Drug use: No   Sexual activity: Not on file  Other Topics Concern   Not on file  Social History Narrative   Retired    household- pt and wife   2 adult children in [place] also has grandchildren   Former but not current smoker, occasional alcohol no drug use   Social Determinants of Corporate investment banker Strain: Low Risk  (05/26/2023)   Overall Financial Resource Strain (CARDIA)    Difficulty of Paying Living Expenses: Not hard at all  Food Insecurity: No Food Insecurity (05/26/2023)   Hunger Vital Sign    Worried About Running Out of Food in the Last Year: Never true    Ran Out of Food in the Last Year:  Never true  Transportation Needs: No Transportation Needs (05/26/2023)   PRAPARE - Administrator, Civil Service (Medical): No    Lack of Transportation (Non-Medical): No  Physical Activity: Sufficiently Active (05/26/2023)   Exercise Vital Sign    Days of Exercise per Week: 6 days    Minutes of Exercise per Session: 50 min  Stress: No Stress Concern Present (05/26/2023)   [employer] of Occupational Health - Occupational Stress Questionnaire    Feeling of Stress : Not at all  Social Connections: Socially Integrated (05/26/2023)   Social Connection and Isolation Panel [NHANES]    Frequency of Communication with Friends and Family: More than three times a week    Frequency of Social Gatherings with Friends and Family: More than three times a week    Attends Religious Services: More than 4 times per year    Active Member of [employer] or Organizations: Yes    Attends Banker Meetings: More than 4 times per year    Marital Status: Married  Catering manager Violence: Unknown (03/18/2022)   Received from [employer], [facility]   HITS    Physically Hurt: Not on file    Insult or Talk Down To: Not on file    Threaten Physical Harm: Not on file    Scream or Curse: Not on file    Current Outpatient Medications  Medication Instructions   acetaminophen (TYLENOL) 650 mg, Oral, Every 6 hours PRN   amiodarone (PACERONE) 200 mg, Oral, Daily   apixaban (ELIQUIS) 5 MG TABS tablet TAKE 1 TABLET BY MOUTH TWICE A DAY   candesartan (ATACAND) 8 MG tablet TAKE 1/2 TABLET (4 MG TOTAL) BY MOUTH 2 (TWO) TIMES DAILY.   carboxymethylcellul-glycerin (OPTIVE) 0.5-0.9 % ophthalmic solution 1 drop, Both Eyes, Daily   cetirizine (ZYRTEC) 10 mg, Oral, As needed,     Colchicine (MITIGARE) 0.6 mg, Oral, 2 times daily PRN   dapagliflozin propanediol (FARXIGA) 10 mg, Oral, Daily, (Take BEFORE BREAKFAST)   Fish Oil 3,600 mg, Oral, Daily   furosemide (LASIX) 40 mg, Oral, Daily    levothyroxine (SYNTHROID) 88 mcg, Oral, Daily before breakfast   mexiletine (MEXITIL) 250 mg, Oral, 2 times daily   nitroGLYCERIN (NITROSTAT) 0.4 mg, Sublingual, Every 5 min x3 PRN   rosuvastatin (CRESTOR) 40 mg, Oral, Daily   spironolactone (ALDACTONE) 12.5 mg, Oral, Daily       Objective:   Physical Exam BP 116/64   Pulse (!) 45   Temp 98.2 F (36.8 C) (Oral)   Resp 16   Ht 5\' 9"  (1.753 m)   Wt 148 lb  2 oz (67.2 kg)   SpO2 95%   BMI 21.87 kg/m  General: Well developed, NAD, BMI noted Neck: No  thyromegaly  HEENT:  Normocephalic . Face symmetric, atraumatic Lungs:  CTA B Normal respiratory effort, no intercostal retractions, no accessory muscle use. Heart: Bradycardic.  Abdomen:  Not distended, soft, non-tender. No rebound or rigidity.   Lower extremities: no pretibial edema bilaterally  Skin: Exposed areas without rash. Not pale. Not jaundice Neurologic:  alert & oriented X3.  Speech normal, gait appropriate for age and unassisted Strength symmetric and appropriate for age.  Psych: Cognition and judgment appear intact.  Cooperative with normal attention span and concentration.  Behavior appropriate. No anxious or depressed appearing.     Assessment     Assessment Prediabetes Hyperlipidemia Gout Hypothyroidism-  previously subclinical dz, then rx amidarone 08-2020 and TSH increased to 15 12/2020, started levothyroxine E.D. 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 S/p chemo x 6, XRT x 35; treatment ended 01/2018  PLAN: Here for CPX - Td 04/2017 - pneumonia shot 2009 and 2016, Prevnar 2015.  PNM 20: 2024 - zostavax 2014; s/p shingrex x 2 -Had a flu shot. - Recommend a COVID-vaccine, RSV.    --CCS: 01-2008: Cscope, tics, no polyps; + IFOB  06/2019, cscope 07/2019  no polyps, + hemorrhoids, no further CCS per cscope report --Prostate cancer screening: No symptoms, check PSA.Marland Kitchen --Diet and exercise: Exercises regularly. --labs:BMP TSH -free T4 -A1c- uric acid- PSA -Healthcare POA: See AVS Diabetes: On Farxiga, check A1c.  Further advised for results Hyperlipidemia: Well-controlled per last FLP History of gout, no recent events, check a uric acid Thyroid disease: On Synthroid, check labs. Dysphagia: 07/01/2023, saw GI, Dx esophageal dysphagia >> 08/05/2023, modified barium swallow, aspiration noted, undergoing SP  therapy, feels better!  VT, heart failure, CAD, paroxysmal A-fib: 08/10/2023, saw cardiology, stable, was not recommended to have VT ablation RTC 6 months

## 2023-10-20 NOTE — Patient Instructions (Addendum)
Vaccines I recommend: Covid booster RSV vaccine   Check the  blood pressure regularly Blood pressure goal:  between 110/65 and  135/85. If it is consistently higher or lower, let me know     GO TO THE LAB : Get the blood work     Next visit with me in 6 months Please schedule it at the front desk       "Health Care Power of attorney" ,  "Living will" (Advance care planning documents)  If you already have a living will or healthcare power of attorney, is recommended you bring the copy to be scanned in your chart.   The document will be available to all the doctors you see in the system.  Advance care planning is a process that supports adults in  understanding and sharing their preferences regarding future medical care.  The patient's preferences are recorded in documents called Advance Directives and the can be modified at any time while the patient is in full mental capacity.   If you don't have one, please consider create one.      More information at: [email or web address]

## 2023-10-21 ENCOUNTER — Encounter: Payer: Self-pay | Admitting: Pharmacist

## 2023-10-21 LAB — BASIC METABOLIC PANEL
BUN: 18 mg/dL (ref 6–23)
CO2: 32 meq/L (ref 19–32)
Calcium: 9.2 mg/dL (ref 8.4–10.5)
Chloride: 99 meq/L (ref 96–112)
Creatinine, Ser: 1.03 mg/dL (ref 0.40–1.50)
GFR: 72.18 mL/min (ref >60.00)
Glucose, Bld: 73 mg/dL (ref 70–99)
Potassium: 4.1 meq/L (ref 3.5–5.1)
Sodium: 139 meq/L (ref 135–145)

## 2023-10-21 LAB — PSA: PSA: 0.32 ng/mL (ref 0.10–4.00)

## 2023-10-21 LAB — HEMOGLOBIN A1C: Hgb A1c MFr Bld: 6 % (ref 4.6–6.5)

## 2023-10-21 LAB — URIC ACID: Uric Acid, Serum: 3.1 mg/dL — ABNORMAL LOW (ref 4.0–7.8)

## 2023-10-21 LAB — T4, FREE: Free T4: 1.39 ng/dL (ref 0.60–1.60)

## 2023-10-21 LAB — TSH: TSH: 1.93 u[IU]/mL (ref 0.35–5.50)

## 2023-10-21 NOTE — Progress Notes (Signed)
Pharmacy Note  10/21/2023 Name: Matthew TALTON Sr. MRN: [id number] DOB: 1951/10/26  Subjective: Matthew Paris Sr. is a 72 y.o. year old male who is a primary care patient of Wanda Plump, MD. Clinical Pharmacist Practitioner referral was placed to assist with medication and chronic care management.  Initial referral from 02/29/2020  Engaged with patient face to face for follow up visit today.  Hypertension / CHF Current therapy candasartan 8mg  daily, spironolacton 25mg  0.5 tablet = 12.5mg  daily, furosemide 40mg  every OTHER day and Farxiga 10mg  daily.    BP Readings from Last 3 Encounters:  10/20/23 116/64  08/10/23 128/66  07/01/23 (!) 94/56    Reviewed refill history -  filled candasartan 8mg  for #90 tabs 10/16/2023  ECHO 04/23/2023 show stable EF of 25-30%  Patient state he feels wells. He is golfing several times per week and walking for exercise.    Hyperlipidemia:  Last LDL was at goal of < 55 Taking rosuvastatin 40mg  daily - LR for 90 DS was 07/30/2023   Objective: Review of patient status, including review of consultants reports, laboratory and other test data, was performed as part of comprehensive evaluation and provision of chronic care management services.   Lab Results  Component Value Date   CREATININE 1.06 04/23/2023   CREATININE 0.97 02/26/2023   CREATININE 1.03 11/25/2022    Lab Results  Component Value Date   HGBA1C 6.1 02/26/2023       Component Value Date/Time   CHOL 131 06/02/2023 0842   TRIG 51.0 06/02/2023 0842   TRIG 45 12/16/2006 0912   HDL 65.30 06/02/2023 0842   CHOLHDL 2 06/02/2023 0842   VLDL 10.2 06/02/2023 0842   LDLCALC 56 06/02/2023 0842     Clinical ASCVD: Yes  The 10-year ASCVD risk score (Arnett DK, et al., 2019) is: 14.5%   Values used to calculate the score:     Age: 63 years     Sex: Male     Is Non-Hispanic African American: No     Diabetic: No     Tobacco smoker: No     Systolic Blood Pressure: 116 mmHg      Is BP treated: No     HDL Cholesterol: 65.3 mg/dL     Total Cholesterol: 131 mg/dL    BP Readings from Last 3 Encounters:  10/20/23 116/64  08/10/23 128/66  07/01/23 (!) 94/56     Allergies  Allergen Reactions   Atorvastatin      myalgia, Muscle aching   Entresto [Sacubitril-Valsartan] Itching    Had itching and redness   Sulfonamide Derivatives     Rash and hot flashes   Sulfamethoxazole Rash and Other (See Comments)    Hot flashes   Tape Other (See Comments)    Rips skin- use PAPER tape    Medications Reviewed Today     Reviewed by Henrene Pastor, RPH-CPP (Pharmacist) on 10/21/23 at 0803  Med List Status: <None>   Medication Order Taking? Sig Documenting Provider Last Dose Status Informant  acetaminophen (TYLENOL) 325 MG tablet [id number] No Take 650 mg by mouth every 6 (six) hours as needed (for pain.). [provider] Taking Active Self           Med Note Roetta Sessions, LENA B   Wed Oct 12, 2019  2:03 PM)    amiodarone (PACERONE) 200 MG tablet [id number] No Take 1 tablet (200 mg total) by mouth daily. Marinus Maw, MD Taking Active   apixaban Everlene Balls)  5 MG TABS tablet [id number] No TAKE 1 TABLET BY MOUTH TWICE A DAY Marinus Maw, MD Taking Active   candesartan (ATACAND) 8 MG tablet [id number] No TAKE 1/2 TABLET (4 MG TOTAL) BY MOUTH 2 (TWO) TIMES DAILY. Bensimhon, Bevelyn Buckles, MD Taking Active   carboxymethylcellul-glycerin (OPTIVE) 0.5-0.9 % ophthalmic solution [id number] No Place 1 drop into both eyes daily. [provider] Taking Active Self  cetirizine (ZYRTEC) 10 MG tablet [id number] No Take 10 mg by mouth as needed for allergies. [provider] Taking Active Self  Colchicine (MITIGARE) 0.6 MG CAPS [id number] No Take 0.6 mg by mouth 2 (two) times daily as needed (gout flare).  Patient not taking: Reported on 10/20/2023   Wanda Plump, MD Not Taking Active            Med Note [facility], Paula Libra   Tue Jun 02, 2023  7:57 AM) prn  dapagliflozin  propanediol (FARXIGA) 10 MG TABS tablet [id number] No Take 1 tablet (10 mg total) by mouth daily. (Take BEFORE BREAKFAST) Wanda Plump, MD Taking Active            Med Note [place], [place] B   Wed Oct 21, 2023  8:01 AM) Kittie Plater to receive thru [place] and Me program thru 12/14/2024 (send refills to [facility])   furosemide (LASIX) 40 MG tablet [id number] No TAKE 1 TABLET BY MOUTH EVERY DAY  Patient taking differently: Take 40 mg by mouth every other day.   Bensimhon, Bevelyn Buckles, MD Taking Active   levothyroxine (SYNTHROID) 88 MCG tablet [id number] No Take 1 tablet (88 mcg total) by mouth daily before breakfast. Wanda Plump, MD Taking Active   mexiletine (MEXITIL) 250 MG capsule [id number] No TAKE 1 CAPSULE BY MOUTH TWICE A DAY Bensimhon, Bevelyn Buckles, MD Taking Active   nitroGLYCERIN (NITROSTAT) 0.4 MG SL tablet [id number] No Place 1 tablet (0.4 mg total) under the tongue every 5 (five) minutes x 3 doses as needed for chest pain.  Patient not taking: Reported on 10/20/2023   Wanda Plump, MD Not Taking Active            Med Note [facility], Paula Libra   Tue Jun 02, 2023  7:57 AM) PRN  Omega-3 Fatty Acids (FISH OIL) 1200 MG CAPS [id number] No Take 3,600 mg by mouth daily. [provider] Taking Active Self  rosuvastatin (CRESTOR) 40 MG tablet [id number] No Take 1 tablet (40 mg total) by mouth daily. Wanda Plump, MD Taking Active   spironolactone (ALDACTONE) 25 MG tablet [id number] No Take 0.5 tablets (12.5 mg total) by mouth daily. 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) 11/12/2017   PCP NOTES >>>>>>>>>>>>>>>>>>>>> 04/21/2016   Seborrheic dermatitis of scalp 04/11/2013   Annual physical exam 04/11/2011   MITRAL REGURGITATION 04/10/2010   SYSTOLIC HEART FAILURE, CHRONIC 10/03/2009   Hyperglycemia 06/19/2008   Coronary atherosclerosis 06/14/2008   Automatic implantable cardioverter-defibrillator in situ 06/01/2008   ERECTILE DYSFUNCTION 12/17/2007  Hyperlipidemia 02/06/2007   Gout 02/06/2007   RBBB 02/06/2007   Congestive heart failure (HCC) 02/06/2007   CORONARY ARTERY BYPASS GRAFT, FOUR VESSEL, HX OF 02/12/2005     Medication Assistance:   Marcelline Deist obtained through [facility] and Me Program  medication assistance program.  Enrollment ends 12/14/2024       Assessment / Plan: Hyperlipidemia - LDL goal < 55 - at goal  Continue rosuvastatin daily   HFrEF - stable Continue Farxiga 10mg  daily, spironolactone 25mg  daily and candesartan 8mg  daily and furosemide 40mg  every OTHER day Received notification from [facility] and Me that patient has been approved to receive Farxiga from 12/16/2023 thru 12/14/2024 - [facility] has active Rx with refill thru September 2025.    Follow Up:  Telephone follow up appointment with care management team member scheduled for:  December 2024   Henrene Pastor, PharmD Clinical Pharmacist [facility]  - [facility] [phone]

## 2023-10-26 ENCOUNTER — Other Ambulatory Visit: Payer: Self-pay | Admitting: Internal Medicine

## 2023-10-26 DIAGNOSIS — I480 Paroxysmal atrial fibrillation: Secondary | ICD-10-CM

## 2023-10-26 NOTE — Telephone Encounter (Signed)
Prescription refill request for Eliquis received. Indication: Last office visit: 08/10/23  Rosette Reveal MD Scr: 1.03 on 10/20/23  Epic Age: 72 Weight: 67.1kg  Based on above findings Eliquis 5mg  twice daily is the appropriate dose.  Refill approved.

## 2023-10-27 ENCOUNTER — Ambulatory Visit (INDEPENDENT_AMBULATORY_CARE_PROVIDER_SITE_OTHER): Payer: Medicare HMO

## 2023-10-27 DIAGNOSIS — I429 Cardiomyopathy, unspecified: Secondary | ICD-10-CM

## 2023-10-27 DIAGNOSIS — I5022 Chronic systolic (congestive) heart failure: Secondary | ICD-10-CM | POA: Diagnosis not present

## 2023-10-28 ENCOUNTER — Ambulatory Visit: Payer: Medicare HMO

## 2023-10-28 DIAGNOSIS — Z0000 Encounter for general adult medical examination without abnormal findings: Secondary | ICD-10-CM

## 2023-10-28 NOTE — Patient Instructions (Signed)
Matthew Joyce , Thank you for taking time to come for your Medicare Wellness Visit. I appreciate your ongoing commitment to your health goals. Please review the following plan we discussed and let me know if I can assist you in the future.     This is a list of the screening recommended for you and due dates:  Health Maintenance  Topic Date Due   COVID-19 Vaccine (4 - 2023-24 season) 08/16/2023   Medicare Annual Wellness Visit  10/27/2024   DTaP/Tdap/Td vaccine (3 - Tdap) 04/28/2027   Colon Cancer Screening  07/27/2029   Pneumonia Vaccine  Completed   Flu Shot  Completed   Hepatitis C Screening  Completed   Zoster (Shingles) Vaccine  Completed   HPV Vaccine  Aged Out    Next appointment: Follow up in one year for your annual wellness visit.   Preventive Care 73 Years and Older, Male Preventive care refers to lifestyle choices and visits with your health care provider that can promote health and wellness. What does preventive care include? A yearly physical exam. This is also called an annual well check. Dental exams once or twice a year. Routine eye exams. Ask your health care provider how often you should have your eyes checked. Personal lifestyle choices, including: Daily care of your teeth and gums. Regular physical activity. Eating a healthy diet. Avoiding tobacco and drug use. Limiting alcohol use. Practicing safe sex. Taking low doses of aspirin every day. Taking vitamin and mineral supplements as recommended by your health care provider. What happens during an annual well check? The services and screenings done by your health care provider during your annual well check will depend on your age, overall health, lifestyle risk factors, and family history of disease. Counseling  Your health care provider may ask you questions about your: Alcohol use. Tobacco use. Drug use. Emotional well-being. Home and relationship well-being. Sexual activity. Eating habits. History  of falls. Memory and ability to understand (cognition). Work and work Astronomer. Screening  You may have the following tests or measurements: Height, weight, and BMI. Blood pressure. Lipid and cholesterol levels. These may be checked every 5 years, or more frequently if you are over 60 years old. Skin check. Lung cancer screening. You may have this screening every year starting at age 73 if you have a 30-pack-year history of smoking and currently smoke or have quit within the past 15 years. Fecal occult blood test (FOBT) of the stool. You may have this test every year starting at age 73. Flexible sigmoidoscopy or colonoscopy. You may have a sigmoidoscopy every 5 years or a colonoscopy every 10 years starting at age 73. Prostate cancer screening. Recommendations will vary depending on your family history and other risks. Hepatitis C blood test. Hepatitis B blood test. Sexually transmitted disease (STD) testing. Diabetes screening. This is done by checking your blood sugar (glucose) after you have not eaten for a while (fasting). You may have this done every 1-3 years. Abdominal aortic aneurysm (AAA) screening. You may need this if you are a current or former smoker. Osteoporosis. You may be screened starting at age 12 if you are at high risk. Talk with your health care provider about your test results, treatment options, and if necessary, the need for more tests. Vaccines  Your health care provider may recommend certain vaccines, such as: Influenza vaccine. This is recommended every year. Tetanus, diphtheria, and acellular pertussis (Tdap, Td) vaccine. You may need a Td booster every 10 years. Zoster vaccine.  You may need this after age 73. Pneumococcal 13-valent conjugate (PCV13) vaccine. One dose is recommended after age 73. Pneumococcal polysaccharide (PPSV23) vaccine. One dose is recommended after age 73. Talk to your health care provider about which screenings and vaccines you need  and how often you need them. This information is not intended to replace advice given to you by your health care provider. Make sure you discuss any questions you have with your health care provider. Document Released: 12/28/2015 Document Revised: 08/20/2016 Document Reviewed: 10/02/2015 Elsevier Interactive Patient Education  2017 [employer].  Fall Prevention in the Home Falls can cause injuries. They can happen to people of all ages. There are many things you can do to make your home safe and to help prevent falls. What can I do on the outside of my home? Regularly fix the edges of walkways and driveways and fix any cracks. Remove anything that might make you trip as you walk through a door, such as a raised step or threshold. Trim any bushes or trees on the path to your home. Use bright outdoor lighting. Clear any walking paths of anything that might make someone trip, such as rocks or tools. Regularly check to see if handrails are loose or broken. Make sure that both sides of any steps have handrails. Any raised decks and porches should have guardrails on the edges. Have any leaves, snow, or ice cleared regularly. Use sand or salt on walking paths during winter. Clean up any spills in your garage right away. This includes oil or grease spills. What can I do in the bathroom? Use night lights. Install grab bars by the toilet and in the tub and shower. Do not use towel bars as grab bars. Use non-skid mats or decals in the tub or shower. If you need to sit down in the shower, use a plastic, non-slip stool. Keep the floor dry. Clean up any water that spills on the floor as soon as it happens. Remove soap buildup in the tub or shower regularly. Attach bath mats securely with double-sided non-slip rug tape. Do not have throw rugs and other things on the floor that can make you trip. What can I do in the bedroom? Use night lights. Make sure that you have a light by your bed that is easy  to reach. Do not use any sheets or blankets that are too big for your bed. They should not hang down onto the floor. Have a firm chair that has side arms. You can use this for support while you get dressed. Do not have throw rugs and other things on the floor that can make you trip. What can I do in the kitchen? Clean up any spills right away. Avoid walking on wet floors. Keep items that you use a lot in easy-to-reach places. If you need to reach something above you, use a strong step stool that has a grab bar. Keep electrical cords out of the way. Do not use floor polish or wax that makes floors slippery. If you must use wax, use non-skid floor wax. Do not have throw rugs and other things on the floor that can make you trip. What can I do with my stairs? Do not leave any items on the stairs. Make sure that there are handrails on both sides of the stairs and use them. Fix handrails that are broken or loose. Make sure that handrails are as long as the stairways. Check any carpeting to make sure that it is  firmly attached to the stairs. Fix any carpet that is loose or worn. Avoid having throw rugs at the top or bottom of the stairs. If you do have throw rugs, attach them to the floor with carpet tape. Make sure that you have a light switch at the top of the stairs and the bottom of the stairs. If you do not have them, ask someone to add them for you. What else can I do to help prevent falls? Wear shoes that: Do not have high heels. Have rubber bottoms. Are comfortable and fit you well. Are closed at the toe. Do not wear sandals. If you use a stepladder: Make sure that it is fully opened. Do not climb a closed stepladder. Make sure that both sides of the stepladder are locked into place. Ask someone to hold it for you, if possible. Clearly mark and make sure that you can see: Any grab bars or handrails. First and last steps. Where the edge of each step is. Use tools that help you move  around (mobility aids) if they are needed. These include: Canes. Walkers. Scooters. Crutches. Turn on the lights when you go into a dark area. Replace any light bulbs as soon as they burn out. Set up your furniture so you have a clear path. Avoid moving your furniture around. If any of your floors are uneven, fix them. If there are any pets around you, be aware of where they are. Review your medicines with your doctor. Some medicines can make you feel dizzy. This can increase your chance of falling. Ask your doctor what other things that you can do to help prevent falls. This information is not intended to replace advice given to you by your health care provider. Make sure you discuss any questions you have with your health care provider. Document Released: 09/27/2009 Document Revised: 05/08/2016 Document Reviewed: 01/05/2015 Elsevier Interactive Patient Education  2017 [employer].

## 2023-10-28 NOTE — Progress Notes (Signed)
Subjective:   Matthew Paris Sr. is a 72 y.o. male who presents for Medicare Annual/Subsequent preventive examination.  Visit Complete: Virtual I connected with  Alla Feeling People Sr. on 10/28/23 by a audio enabled telemedicine application and verified that I am speaking with the correct person using two identifiers.  Patient Location: Home  Provider Location: Office/Clinic  I discussed the limitations of evaluation and management by telemedicine. The patient expressed understanding and agreed to proceed.  Vital Signs: Because this visit was a virtual/telehealth visit, some criteria may be missing or patient reported. Any vitals not documented were not able to be obtained and vitals that have been documented are patient reported.  Patient Medicare AWV questionnaire was completed by the patient on 10/27/23; I have confirmed that all information answered by patient is correct and no changes since this date.  Cardiac Risk Factors include: advanced age (>12men, >86 women);male gender;family history of premature cardiovascular disease;dyslipidemia     Objective:    There were no vitals filed for this visit. There is no height or weight on file to calculate BMI.     10/28/2023    3:02 PM 09/09/2023    8:51 AM 10/16/2022    9:04 AM 10/10/2021    8:23 AM 10/24/2019    8:41 AM 09/20/2019   12:44 PM 07/28/2019   11:14 AM  Advanced Directives  Does Patient Have a Medical Advance Directive? Yes Unable to assess, patient is non-responsive or altered mental status Yes Yes Yes Yes Yes  Type of Advance Directive Healthcare Power of [place];Living will  Healthcare Power of [place];Living will Healthcare Power of [place];Living will Healthcare Power of [place];Living will Healthcare Power of [employer] of [place];Living will  Does patient want to make changes to medical advance directive? No - Patient declined     No - Patient declined   Copy of Healthcare Power of Attorney in  Chart? No - copy requested  No - copy requested No - copy requested No - copy requested No - copy requested No - copy requested    Current Medications (verified) Outpatient Encounter Medications as of 10/28/2023  Medication Sig   acetaminophen (TYLENOL) 325 MG tablet Take 650 mg by mouth every 6 (six) hours as needed (for pain.).   amiodarone (PACERONE) 200 MG tablet Take 1 tablet (200 mg total) by mouth daily.   candesartan (ATACAND) 8 MG tablet TAKE 1/2 TABLET (4 MG TOTAL) BY MOUTH 2 (TWO) TIMES DAILY.   carboxymethylcellul-glycerin (OPTIVE) 0.5-0.9 % ophthalmic solution Place 1 drop into both eyes daily.   cetirizine (ZYRTEC) 10 MG tablet Take 10 mg by mouth as needed for allergies.   Colchicine (MITIGARE) 0.6 MG CAPS Take 0.6 mg by mouth 2 (two) times daily as needed (gout flare). (Patient not taking: Reported on 10/20/2023)   dapagliflozin propanediol (FARXIGA) 10 MG TABS tablet Take 1 tablet (10 mg total) by mouth daily. (Take BEFORE BREAKFAST)   ELIQUIS 5 MG TABS tablet TAKE 1 TABLET BY MOUTH TWICE A DAY   furosemide (LASIX) 40 MG tablet TAKE 1 TABLET BY MOUTH EVERY DAY (Patient taking differently: Take 40 mg by mouth every other day.)   levothyroxine (SYNTHROID) 88 MCG tablet Take 1 tablet (88 mcg total) by mouth daily before breakfast.   mexiletine (MEXITIL) 250 MG capsule TAKE 1 CAPSULE BY MOUTH TWICE A DAY   nitroGLYCERIN (NITROSTAT) 0.4 MG SL tablet Place 1 tablet (0.4 mg total) under the tongue every 5 (five) minutes x 3 doses as  needed for chest pain. (Patient not taking: Reported on 10/20/2023)   Omega-3 Fatty Acids (FISH OIL) 1200 MG CAPS Take 3,600 mg by mouth daily.   rosuvastatin (CRESTOR) 40 MG tablet Take 1 tablet (40 mg total) by mouth daily.   spironolactone (ALDACTONE) 25 MG tablet Take 0.5 tablets (12.5 mg total) by mouth daily.   No facility-administered encounter medications on file as of 10/28/2023.    Allergies (verified) Atorvastatin, Entresto  [sacubitril-valsartan], Sulfonamide derivatives, Sulfamethoxazole, and Tape   History: Past Medical History:  Diagnosis Date   AICD (automatic cardioverter/defibrillator) present 05/2008   [employer]   Allergy    Arthritis    Bradycardia    Use of beta blocker limited   CAD (coronary artery disease)    a- S/p cabg in march 2006 by Dr.Owen;  b. Celine Ahr 5/12: large IL scar from apex to base, no ischemia, EF 37%   Cancer (HCC)    on right side of neck   Clotting disorder [facility])    ED (erectile dysfunction)    Gout    Hyperlipidemia    Ischemic cardiomyopathy    a-Echocardiogram, Nov 2006, showed ejection fraction of 30-40% with mild to moderated mitral  regurgitation and mild aortic regurgitation b- Cardiac MRI, May 2007, EF of 44% with 50% scar involving  the inferolateral walls. No comment of mitral regurgitation. c- last echo  11/10: EF 30-35%  mod MR d- Nega T-Wave alternans testing in May of 2007 e- no ACE due to low BP;  f. CPX 3/11: normal   RBBB (right bundle branch block with left anterior fascicular block)    Systolic CHF, chronic (HCC)    Thyroid disease    Past Surgical History:  Procedure Laterality Date   CARDIAC DEFIBRILLATOR PLACEMENT  06/01/08   ICD   COLONOSCOPY     COLONOSCOPY WITH PROPOFOL N/A 07/28/2019   Procedure: COLONOSCOPY WITH PROPOFOL;  Surgeon: Iva Boop, MD;  Location: [facility] ENDOSCOPY;  Service: Endoscopy;  Laterality: N/A;   CORONARY ARTERY BYPASS GRAFT  02-2005   ICD GENERATOR CHANGEOUT N/A 02/03/2017   Procedure: ICD Generator Changeout;  Surgeon: Marinus Maw, MD;  Location: [facility] INVASIVE CV LAB;  Service: Cardiovascular;  Laterality: N/A;   ORIF ANKLE FRACTURE  1987   left    RIGHT/LEFT HEART CATH AND CORONARY/GRAFT ANGIOGRAPHY N/A 09/20/2019   Procedure: RIGHT/LEFT HEART CATH AND CORONARY/GRAFT ANGIOGRAPHY;  Surgeon: Dolores Patty, MD;  Location: [facility] INVASIVE CV LAB;  Service: Cardiovascular;  Laterality: N/A;   SHOULDER ARTHROSCOPY  02/2010    rt    TEE WITHOUT CARDIOVERSION N/A 10/24/2019   Procedure: TRANSESOPHAGEAL ECHOCARDIOGRAM (TEE);  Surgeon: Dolores Patty, MD;  Location[facility] ENDOSCOPY;  Service: Cardiovascular;  Laterality: N/A;   TONSILLECTOMY     as a child   Family History  Problem Relation Age of Onset   Heart attack Father 3   Cardiomyopathy Brother    Early death Brother    Heart disease Brother    Colon cancer Neg Hx    Prostate cancer Neg Hx    Diabetes Neg Hx    Social History   Socioeconomic History   Marital status: Married    Spouse name: Not on file   Number of children: 2   Years of education: Not on file   Highest education level: Bachelor's degree (e.g., BA, AB, BS)  Occupational History   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: [employer]  Tobacco Use   Smoking status: Former    Current packs/day: 0.00    Types: Cigarettes    Quit date: 12/16/1999    Years since quitting: 23.8   Smokeless tobacco: Never  Vaping Use   Vaping status: Never Used  Substance and Sexual Activity   Alcohol use: Not Currently   Drug use: Never   Sexual activity: Not Currently  Other Topics Concern   Not on file  Social History Narrative   Retired    household- pt and wife   2 adult children in [place] also has grandchildren   Former but not current smoker, occasional alcohol no drug use   Social Determinants of Corporate investment banker Strain: Low Risk  (10/27/2023)   Overall Financial Resource Strain (CARDIA)    Difficulty of Paying Living Expenses: Not very hard  Food Insecurity: No Food Insecurity (10/27/2023)   Hunger Vital Sign    Worried About Running Out of Food in the Last Year: Never true    Ran Out of Food in the Last Year: Never true  Transportation Needs: No Transportation Needs (10/27/2023)   PRAPARE - Administrator, Civil Service (Medical): No    Lack of Transportation (Non-Medical): No  Physical  Activity: Sufficiently Active (10/27/2023)   Exercise Vital Sign    Days of Exercise per Week: 6 days    Minutes of Exercise per Session: 50 min  Stress: No Stress Concern Present (10/27/2023)   [employer] of Occupational Health - Occupational Stress Questionnaire    Feeling of Stress : Only a little  Social Connections: Socially Integrated (10/27/2023)   Social Connection and Isolation Panel [NHANES]    Frequency of Communication with Friends and Family: More than three times a week    Frequency of Social Gatherings with Friends and Family: More than three times a week    Attends Religious Services: More than 4 times per year    Active Member of [employer] or Organizations: Yes    Attends Engineer, structural: More than 4 times per year    Marital Status: Married    Tobacco Counseling Counseling given: Not Answered   Clinical Intake:  Pre-visit preparation completed: Yes  Pain : No/denies pain  Nutritional Risks: None Diabetes: No  How often do you need to have someone help you when you read instructions, pamphlets, or other written materials from your doctor or pharmacy?: 1 - Never  Interpreter Needed?: No  Information entered by :: [employer], CMA   Activities of Daily Living    10/27/2023    3:43 PM 10/25/2023    5:51 AM  In your present state of health, do you have any difficulty performing the following activities:  Hearing? 0 0  Vision? 0 0  Difficulty concentrating or making decisions? 0 0  Walking or climbing stairs? 0 0  Dressing or bathing? 0 0  Doing errands, shopping? 0 0  Preparing Food and eating ? N N  Using the Toilet? N N  In the past six months, have you accidently leaked urine? N N  Do you have problems with loss of bowel control? N N  Managing your Medications? N N  Managing your Finances? N N  Housekeeping or managing your Housekeeping? N N    Patient Care Team: Wanda Plump, MD as PCP - General Ladona Ridgel Doylene Canning, MD as PCP -  Electrophysiology (Cardiology) Bensimhon, Bevelyn Buckles, MD as Consulting Physician (Cardiology) Naida Sleight, MD as  Referring Physician Marinus Maw, MD as Consulting Physician (Cardiology) Henrene Pastor, RPH-CPP (Pharmacist)  Indicate any recent Medical Services you may have received from other than [facility] providers in the past year (date may be approximate).     Assessment:   This is a routine wellness examination for Caius.  Hearing/Vision screen No results found.   Goals Addressed   None    Depression Screen    10/28/2023    3:03 PM 10/20/2023    2:12 PM 06/02/2023    8:03 AM 01/29/2023   10:38 AM 10/16/2022    9:05 AM 09/30/2022    8:33 AM 06/19/2022    8:05 AM  PHQ 2/9 Scores  PHQ - 2 Score 0 0 0 0 0 1 1    Fall Risk    10/27/2023    3:43 PM 10/25/2023    5:51 AM 10/20/2023    2:12 PM 06/02/2023    8:02 AM 01/29/2023   10:38 AM  Fall Risk   Falls in the past year? 0 0 0 0 0  Number falls in past yr: 0 0 0 0 0  Injury with Fall? 0 0 0 0 0  Risk for fall due to : No Fall Risks      Follow up Falls evaluation completed  Falls evaluation completed Falls evaluation completed Falls evaluation completed    MEDICARE RISK AT HOME: Medicare Risk at Home Any stairs in or around the home?: Yes If so, are there any without handrails?: No Home free of loose throw rugs in walkways, pet beds, electrical cords, etc?: No Adequate lighting in your home to reduce risk of falls?: Yes Life alert?: No Use of a cane, walker or w/c?: No Grab bars in the bathroom?: No Shower chair or bench in shower?: Yes Elevated toilet seat or a handicapped toilet?: No  TIMED UP AND GO:  Was the test performed?  No    Cognitive Function:        10/28/2023    3:04 PM 10/16/2022    9:11 AM  6CIT Screen  What Year? 0 points 0 points  What month? 0 points 0 points  What time? 0 points 0 points  Count back from 20 0 points 0 points  Months in reverse  0 points  Repeat phrase  0 points   Total Score  0 points    Immunizations Immunization History  Administered Date(s) Administered   Fluad Quad(high Dose 65+) 10/25/2020, 10/10/2022   Influenza Split 10/10/2011, 10/11/2012   Influenza Whole 11/18/2007, 09/27/2008, 09/27/2009, 08/20/2010   Influenza, High Dose Seasonal PF 11/25/2016, 10/22/2017, 10/21/2018, 10/19/2019, 10/14/2021   Influenza,inj,Quad PF,6+ Mos 10/13/2013, 10/24/2014, 11/20/2015   Influenza-Unspecified 10/19/2023   PFIZER(Purple Top)SARS-COV-2 Vaccination 02/03/2020, 02/22/2020, 09/18/2020   PNEUMOCOCCAL CONJUGATE-20 01/29/2023   Pneumococcal Conjugate-13 04/13/2014   Pneumococcal Polysaccharide-23 03/07/2009, 11/20/2015   Td 12/15/2006, 04/27/2017   Zoster Recombinant(Shingrix) 04/30/2018, 07/02/2018   Zoster, Live 04/11/2013    TDAP status: Up to date  Flu Vaccine status: Up to date  Pneumococcal vaccine status: Up to date  Covid-19 vaccine status: Information provided on how to obtain vaccines.   Qualifies for Shingles Vaccine? Yes   Zostavax completed Yes   Shingrix Completed?: Yes  Screening Tests Health Maintenance  Topic Date Due   COVID-19 Vaccine (4 - 2023-24 season) 08/16/2023   Medicare Annual Wellness (AWV)  10/17/2023   DTaP/Tdap/Td (3 - Tdap) 04/28/2027   Colonoscopy  07/27/2029   Pneumonia Vaccine 77+ Years old  Completed  INFLUENZA VACCINE  Completed   Hepatitis C Screening  Completed   Zoster Vaccines- Shingrix  Completed   HPV VACCINES  Aged Out    Health Maintenance  Health Maintenance Due  Topic Date Due   COVID-19 Vaccine (4 - 2023-24 season) 08/16/2023   Medicare Annual Wellness (AWV)  10/17/2023    Colorectal cancer screening: Type of screening: Colonoscopy. Completed 07/28/19. Repeat every 10 years  Lung Cancer Screening: (Low Dose CT Chest recommended if Age 44-80 years, 20 pack-year currently smoking OR have quit w/in 15years.) does not qualify.   Additional Screening:  Hepatitis C Screening: does  qualify; Completed 04/27/17  Vision Screening: Recommended annual ophthalmology exams for early detection of glaucoma and other disorders of the eye. Is the patient up to date with their annual eye exam?  Yes  Who is the provider or what is the name of the office in which the patient attends annual eye exams? [facility]  If pt is not established with a provider, would they like to be referred to a provider to establish care? No .   Dental Screening: Recommended annual dental exams for proper oral hygiene  Diabetic Foot Exam: N/a  Community Resource Referral / Chronic Care Management: CRR required this visit?  No   CCM required this visit?  No     Plan:     I have personally reviewed and noted the following in the patient's chart:   Medical and social history Use of alcohol, tobacco or illicit drugs  Current medications and supplements including opioid prescriptions. Patient is not currently taking opioid prescriptions. Functional ability and status Nutritional status Physical activity Advanced directives List of other physicians Hospitalizations, surgeries, and ER visits in previous 12 months Vitals Screenings to include cognitive, depression, and falls Referrals and appointments  In addition, I have reviewed and discussed with patient certain preventive protocols, quality metrics, and best practice recommendations. A written personalized care plan for preventive services as well as general preventive health recommendations were provided to patient.     Donne Anon, CMA   10/28/2023   After Visit Summary: (MyChart) Due to this being a telephonic visit, the after visit summary with patients personalized plan was offered to patient via MyChart   Nurse Notes: None

## 2023-10-29 ENCOUNTER — Telehealth: Payer: Self-pay

## 2023-10-29 LAB — CUP PACEART REMOTE DEVICE CHECK
Battery Remaining Longevity: 47 mo
Battery Remaining Percentage: 45 %
Battery Voltage: 2.93 V
Brady Statistic RV Percent Paced: 2.4 %
Date Time Interrogation Session: 20241112040016
HighPow Impedance: 50 Ohm
HighPow Impedance: 51 Ohm
Implantable Lead Connection Status: 753985
Implantable Lead Implant Date: 20090618
Implantable Lead Location: 753860
Implantable Lead Model: 7121
Implantable Pulse Generator Implant Date: 20180220
Lead Channel Impedance Value: 1275 Ohm
Lead Channel Pacing Threshold Amplitude: 1.5 V
Lead Channel Pacing Threshold Pulse Width: 1 ms
Lead Channel Sensing Intrinsic Amplitude: 3.7 mV
Lead Channel Setting Pacing Amplitude: 2.5 V
Lead Channel Setting Pacing Pulse Width: 1 ms
Lead Channel Setting Sensing Sensitivity: 0.5 mV
Pulse Gen Serial Number: 7406967

## 2023-10-29 NOTE — Telephone Encounter (Signed)
Device clinic apt made 11/04/23 @8 :40 am. Will need industry present, note placed in apt tab.

## 2023-10-29 NOTE — Telephone Encounter (Signed)
Alert received from [employer] for ventricular lead noise. Called patient to make device clinic apt. No answer, LMTCB.

## 2023-11-04 ENCOUNTER — Ambulatory Visit: Payer: Medicare HMO | Attending: Cardiology

## 2023-11-04 DIAGNOSIS — I255 Ischemic cardiomyopathy: Secondary | ICD-10-CM

## 2023-11-04 LAB — CUP PACEART INCLINIC DEVICE CHECK
Battery Remaining Longevity: 48 mo
Brady Statistic RV Percent Paced: 2.5 %
Date Time Interrogation Session: 20241120093056
HighPow Impedance: 52.556
Implantable Lead Connection Status: 753985
Implantable Lead Implant Date: 20090618
Implantable Lead Location: 753860
Implantable Lead Model: 7121
Implantable Pulse Generator Implant Date: 20180220
Lead Channel Impedance Value: 487.5 Ohm
Lead Channel Pacing Threshold Amplitude: 2 V
Lead Channel Pacing Threshold Amplitude: 2 V
Lead Channel Pacing Threshold Pulse Width: 1 ms
Lead Channel Pacing Threshold Pulse Width: 1 ms
Lead Channel Sensing Intrinsic Amplitude: 3.7 mV
Lead Channel Setting Pacing Amplitude: 3 V
Lead Channel Setting Pacing Pulse Width: 1 ms
Lead Channel Setting Sensing Sensitivity: 0.5 mV
Pulse Gen Serial Number: 7406967

## 2023-11-04 NOTE — Progress Notes (Signed)
ICD check in clinic. Patient came in to assess RV lead noise concerns and elevated lead impedance values. Lead interrogation performed by Judie Grieve from [facility]. Device check performed afterward by device RN. Isometric testing confirmed suspicion of RV lead fracture. Dr Ladona Ridgel aware, but considering patient's medical status has decided not to perform a lead revision as it is not warranted.   Programming changes made to minimize noise detection and wavelet updated by Judie Grieve. (see attachment for details)

## 2023-11-05 ENCOUNTER — Telehealth: Payer: Self-pay | Admitting: Internal Medicine

## 2023-11-05 ENCOUNTER — Ambulatory Visit: Payer: Medicare HMO | Attending: Cardiovascular Disease

## 2023-11-05 DIAGNOSIS — I472 Ventricular tachycardia, unspecified: Secondary | ICD-10-CM

## 2023-11-05 DIAGNOSIS — I255 Ischemic cardiomyopathy: Secondary | ICD-10-CM

## 2023-11-05 LAB — CUP PACEART INCLINIC DEVICE CHECK
Date Time Interrogation Session: 20241121193827
Implantable Lead Connection Status: 753985
Implantable Lead Implant Date: 20090618
Implantable Lead Location: 753860
Implantable Lead Model: 7121
Implantable Pulse Generator Implant Date: 20180220
Pulse Gen Serial Number: 7406967

## 2023-11-05 NOTE — Progress Notes (Signed)
Patient received inappropriate shock yesterday 11/20 due to increased noise on his RV lead.  Adjustments made yesterday to prevent inappropriate shock were ineffective.  Episode shows noise / damage coming from multiple locations on the lead, no further reprogramming will fix the issue.  Dr. Ladona Ridgel consulted, decision made to turn off tachy therapies to prevent further shocks until a new lead can be placed.  Explained issue and reason for turning off therapy with patient in clinic today.  Patient provided with choice and agreed with turning off therapy until issue could be repaired.    Dr. Ladona Ridgel will see patient tomorrow 11/22 to discuss options and possibly send for a venogram/new lead if can be coordinated.  Patient is aware to be NPO after 9 pm tonight and not to take any of his medications in the morning.  Patient expresses wish to replace the lead as soon as possible.

## 2023-11-05 NOTE — Telephone Encounter (Signed)
Patient is coming in to see Dr. Ladona Ridgel at 915 in the morning. He will be NPO and hold all of his morning medications.  Dr. Ladona Ridgel will review options and sending for venogram. Patient is wanting to move forward ASAP with adding a new lead.   Dr. Ladona Ridgel is aware.

## 2023-11-05 NOTE — Telephone Encounter (Signed)
Patient stated his device is not working properly and thinks it needs to be adjusted.

## 2023-11-05 NOTE — Telephone Encounter (Signed)
Alert remote transmission: ICD shock There were 3 NSVT arrhythmias detected, these were all due to lead noise.  The patient received an inappropriate ICD shock due to lead noise.  See clinic note from 11/04/2023.  Sent to triage high priority. Follow up as scheduled.   Reviewed with [facility] and Dr. Ladona Ridgel.  There are no other re-programming options, highly suspect wire is no longer viable.  Dr. Ladona Ridgel requests patient come in today to have therapies turned off and put on schedule in the am tomorrow with GT in office to discuss options. Patient should fast after 9pm tonight so that venogram to check vein patency can be performed if consideration for adding a new lead is possible.   Spoke with patient, he is on his way.  CC: to GT and Stevie Banker) for update.

## 2023-11-06 ENCOUNTER — Encounter: Payer: Self-pay | Admitting: Internal Medicine

## 2023-11-06 ENCOUNTER — Ambulatory Visit: Payer: Medicare HMO | Attending: Internal Medicine | Admitting: Internal Medicine

## 2023-11-06 VITALS — BP 116/60 | HR 46 | Ht 69.0 in | Wt 145.0 lb

## 2023-11-06 DIAGNOSIS — Z01812 Encounter for preprocedural laboratory examination: Secondary | ICD-10-CM

## 2023-11-06 DIAGNOSIS — I472 Ventricular tachycardia, unspecified: Secondary | ICD-10-CM | POA: Diagnosis not present

## 2023-11-06 NOTE — H&P (View-Only) (Signed)
HPI Matthew Joyce today for ongoing evaluation of ventricular tachycardia.  He is a very pleasant 72 year old man with an ischemic cardiomyopathy status post bypass surgery over 17 years ago.  His ejection fraction has been between 30 and 35%.  He is status post ICD implantation.  He has a history of sinus node dysfunction and right bundle branch block.  He has been intolerant to beta-blockers in the past.  The patient has had recurrent episodes of ventricular tachycardia and has been placed on Pacerone as well as mexiletine.  He feels better with no recurrent arrhythmias recently and no problems tolerating his medications.  He has developed bradycardia but is asymptomatic.  He is able to play golf and walk.  He has class II heart failure symptoms.  He received an ICD shock due to noise artifact. His pacing impedence has acutely risen. He presents to discuss his treatment options.  No recent syncope. Allergies  Allergen Reactions   Atorvastatin      myalgia, Muscle aching   Entresto [Sacubitril-Valsartan] Itching    Had itching and redness   Sulfonamide Derivatives     Rash and hot flashes   Sulfamethoxazole Rash and Other (See Comments)    Hot flashes   Tape Other (See Comments)    Rips skin- use PAPER tape     Current Outpatient Medications  Medication Sig Dispense Refill   acetaminophen (TYLENOL) 500 MG tablet Take 500-1,000 mg by mouth every 6 (six) hours as needed (for pain.).     amiodarone (PACERONE) 200 MG tablet Take 1 tablet (200 mg total) by mouth daily. 90 tablet 3   candesartan (ATACAND) 8 MG tablet TAKE 1/2 TABLET (4 MG TOTAL) BY MOUTH 2 (TWO) TIMES DAILY. 90 tablet 3   carboxymethylcellul-glycerin (OPTIVE) 0.5-0.9 % ophthalmic solution Place 1 drop into both eyes daily. Refresh     cetirizine (ZYRTEC) 10 MG tablet Take 10 mg by mouth daily as needed for allergies.     Colchicine (MITIGARE) 0.6 MG CAPS Take 0.6 mg by mouth 2 (two) times daily as needed (gout flare). 60  capsule 0   dapagliflozin propanediol (FARXIGA) 10 MG TABS tablet Take 1 tablet (10 mg total) by mouth daily. (Take BEFORE BREAKFAST) 90 tablet 3   ELIQUIS 5 MG TABS tablet TAKE 1 TABLET BY MOUTH TWICE A DAY 180 tablet 1   furosemide (LASIX) 40 MG tablet TAKE 1 TABLET BY MOUTH EVERY DAY (Patient taking differently: Take 40 mg by mouth every other day.) 90 tablet 3   levothyroxine (SYNTHROID) 88 MCG tablet Take 1 tablet (88 mcg total) by mouth daily before breakfast. 90 tablet 1   mexiletine (MEXITIL) 250 MG capsule TAKE 1 CAPSULE BY MOUTH TWICE A DAY 180 capsule 1   nitroGLYCERIN (NITROSTAT) 0.4 MG SL tablet Place 1 tablet (0.4 mg total) under the tongue every 5 (five) minutes x 3 doses as needed for chest pain. 25 tablet 3   Omega-3 Fatty Acids (FISH OIL) 1200 MG CAPS Take 3,600 mg by mouth daily.     rosuvastatin (CRESTOR) 40 MG tablet Take 1 tablet (40 mg total) by mouth daily. 90 tablet 2   spironolactone (ALDACTONE) 25 MG tablet Take 0.5 tablets (12.5 mg total) by mouth daily. 45 tablet 3   No current facility-administered medications for this visit.     Past Medical History:  Diagnosis Date   AICD (automatic cardioverter/defibrillator) present 05/2008   St. Jude   Allergy    Arthritis  Bradycardia    Use of beta blocker limited   CAD (coronary artery disease)    a- S/p cabg in march 2006 by Dr.Owen;  b. Celine Ahr 5/12: large IL scar from apex to base, no ischemia, EF 37%   Cancer (HCC)    on right side of neck   Clotting disorder [facility])    ED (erectile dysfunction)    Gout    Hyperlipidemia    Ischemic cardiomyopathy    a-Echocardiogram, Nov 2006, showed ejection fraction of 30-40% with mild to moderated mitral  regurgitation and mild aortic regurgitation b- Cardiac MRI, May 2007, EF of 44% with 50% scar involving  the inferolateral walls. No comment of mitral regurgitation. c- last echo  11/10: EF 30-35%  mod MR d- Nega T-Wave alternans testing in May of 2007 e- no ACE due to  low BP;  f. CPX 3/11: normal   RBBB (right bundle branch block with left anterior fascicular block)    Systolic CHF, chronic (HCC)    Thyroid disease     ROS:   All systems reviewed and negative except as noted in the HPI.   Past Surgical History:  Procedure Laterality Date   CARDIAC DEFIBRILLATOR PLACEMENT  06/01/08   ICD   COLONOSCOPY     COLONOSCOPY WITH PROPOFOL N/A 07/28/2019   Procedure: COLONOSCOPY WITH PROPOFOL;  Surgeon: Iva Boop, MD;  Location: [facility] ENDOSCOPY;  Service: Endoscopy;  Laterality: N/A;   CORONARY ARTERY BYPASS GRAFT  02-2005   ICD GENERATOR CHANGEOUT N/A 02/03/2017   Procedure: ICD Generator Changeout;  Surgeon: Marinus Maw, MD;  Location: [facility] INVASIVE CV LAB;  Service: Cardiovascular;  Laterality: N/A;   ORIF ANKLE FRACTURE  1987   left    RIGHT/LEFT HEART CATH AND CORONARY/GRAFT ANGIOGRAPHY N/A 09/20/2019   Procedure: RIGHT/LEFT HEART CATH AND CORONARY/GRAFT ANGIOGRAPHY;  Surgeon: Dolores Patty, MD;  Location: [facility] INVASIVE CV LAB;  Service: Cardiovascular;  Laterality: N/A;   SHOULDER ARTHROSCOPY  02/2010   rt    TEE WITHOUT CARDIOVERSION N/A 10/24/2019   Procedure: TRANSESOPHAGEAL ECHOCARDIOGRAM (TEE);  Surgeon: Dolores Patty, MD;  Location: [facility] ENDOSCOPY;  Service: Cardiovascular;  Laterality: N/A;   TONSILLECTOMY     as a child     Family History  Problem Relation Age of Onset   Heart attack Father 8   Cardiomyopathy Brother    Early death Brother    Heart disease Brother    Colon cancer Neg Hx    Prostate cancer Neg Hx    Diabetes Neg Hx      Social History   Socioeconomic History   Marital status: Married    Spouse name: Not on file   Number of children: 2   Years of education: Not on file   Highest education level: Bachelor's degree (e.g., BA, AB, BS)  Occupational History   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     Employer: [employer]  Tobacco Use    Smoking status: Former    Current packs/day: 0.00    Types: Cigarettes    Quit date: 12/16/1999    Years since quitting: 23.9   Smokeless tobacco: Never  Vaping Use   Vaping status: Never Used  Substance and Sexual Activity   Alcohol use: Not Currently   Drug use: Never   Sexual activity: Not Currently  Other Topics Concern   Not on file  Social History Narrative   Retired    household- pt and  wife   2 adult children in [place] also has grandchildren   Former but not current smoker, occasional alcohol no drug use   Social Determinants of Corporate investment banker Strain: Low Risk  (10/27/2023)   Overall Financial Resource Strain (CARDIA)    Difficulty of Paying Living Expenses: Not very hard  Food Insecurity: No Food Insecurity (10/27/2023)   Hunger Vital Sign    Worried About Running Out of Food in the Last Year: Never true    Ran Out of Food in the Last Year: Never true  Transportation Needs: No Transportation Needs (10/27/2023)   PRAPARE - Administrator, Civil Service (Medical): No    Lack of Transportation (Non-Medical): No  Physical Activity: Sufficiently Active (10/27/2023)   Exercise Vital Sign    Days of Exercise per Week: 6 days    Minutes of Exercise per Session: 50 min  Stress: No Stress Concern Present (10/27/2023)   [employer] of Occupational Health - Occupational Stress Questionnaire    Feeling of Stress : Only a little  Social Connections: Socially Integrated (10/27/2023)   Social Connection and Isolation Panel [NHANES]    Frequency of Communication with Friends and Family: More than three times a week    Frequency of Social Gatherings with Friends and Family: More than three times a week    Attends Religious Services: More than 4 times per year    Active Member of [employer] or Organizations: Yes    Attends Engineer, structural: More than 4 times per year    Marital Status: Married  Catering manager Violence: Not At Risk (10/28/2023)    Humiliation, Afraid, Rape, and Kick questionnaire    Fear of Current or Ex-Partner: No    Emotionally Abused: No    Physically Abused: No    Sexually Abused: No     BP 116/60   Pulse (!) 46   Ht 5\' 9"  (1.753 m)   Wt 145 lb (65.8 kg)   SpO2 99%   BMI 21.41 kg/m   Physical Exam:  Well appearing NAD HEENT: Unremarkable Neck:  No JVD, no thyromegally Lymphatics:  No adenopathy Back:  No CVA tenderness Lungs:  Clear with no wheezes HEART:  Regular rate rhythm, no murmurs, no rubs, no clicks Abd:  soft, positive bowel sounds, no organomegally, no rebound, no guarding Ext:  2 plus pulses, no edema, no cyanosis, no clubbing Skin:  No rashes no nodules Neuro:  CN II through XII intact, motor grossly intact  EKG - NSR  DEVICE  Normal device function.  See PaceArt for details.  ICD rate sense lead has failed.   Assess/Plan:  1.  Recurrent ventricular tachycardia -his ventricular tachycardia appears to be fairly well controlled on a combination of amiodarone and mexiletine.  He will undergo watchful waiting.  For now I would not recommend VT ablation unless his ventricular tachycardia becomes incessant and slower on medical therapy. 2.  Chronic systolic heart failure - his heart failure symptoms are class II.  He will continue his current medical therapy.  He is encouraged to maintain a low-sodium diet. 3.  Coronary artery disease -he is status post bypass grafting and has no anginal symptoms. 4.  Paroxysmal atrial fibrillation -the patient is maintaining sinus rhythm.  No change in medications. 5. ICD lead malfunction -He has had an inappropriate shock due to noise and will undergo ICD lead revision. If his vein is open, he will undergo insertion of a new device and  lead on the ipsilateral side. If the vein is occuded would plan for move to contral lateral side.    Lewayne Dibello, MD

## 2023-11-06 NOTE — Patient Instructions (Addendum)
Medication Instructions:  Your physician recommends that you continue on your current medications as directed. Please refer to the Current Medication list given to you today.  *If you need a refill on your cardiac medications before your next appointment, please call your pharmacy*  Lab Work: CBC and BMP today!  If you have labs (blood work) drawn today and your tests are completely normal, you will receive your results only by: MyChart Message (if you have MyChart) OR A paper copy in the mail If you have any lab test that is abnormal or we need to change your treatment, we will call you to review the results.  Testing/Procedures: Lead revision. Instructions in letter.  Follow-Up: At [facility], you and your health needs are our priority.  As part of our continuing mission to provide you with exceptional heart care, we have created designated Provider Care Teams.  These Care Teams include your primary Cardiologist (physician) and Advanced Practice Providers (APPs -  Physician Assistants and Nurse Practitioners) who all work together to provide you with the care you need, when you need it.  Your next appointment:   To be scheduled  The format for your next appointment:   In Person  Provider:   Lewayne Strozier, MD{or one of the following Advanced Practice Providers on your designated Care Team:   Francis Dowse, [place] Casimiro Needle "Mardelle Matte" [place], [place] Earnest Rosier, NP    Important Information About Sugar

## 2023-11-06 NOTE — Progress Notes (Signed)
HPI [name] today for ongoing evaluation of ventricular tachycardia.  He is a very pleasant 72 year old man with an ischemic cardiomyopathy status post bypass surgery over 17 years ago.  His ejection fraction has been between 30 and 35%.  He is status post ICD implantation.  He has a history of sinus node dysfunction and right bundle branch block.  He has been intolerant to beta-blockers in the past.  The patient has had recurrent episodes of ventricular tachycardia and has been placed on Pacerone as well as mexiletine.  He feels better with no recurrent arrhythmias recently and no problems tolerating his medications.  He has developed bradycardia but is asymptomatic.  He is able to play golf and walk.  He has class II heart failure symptoms.  He received an ICD shock due to noise artifact. His pacing impedence has acutely risen. He presents to discuss his treatment options.  No recent syncope. Allergies  Allergen Reactions   Atorvastatin      myalgia, Muscle aching   Entresto [Sacubitril-Valsartan] Itching    Had itching and redness   Sulfonamide Derivatives     Rash and hot flashes   Sulfamethoxazole Rash and Other (See Comments)    Hot flashes   Tape Other (See Comments)    Rips skin- use PAPER tape     Current Outpatient Medications  Medication Sig Dispense Refill   acetaminophen (TYLENOL) 500 MG tablet Take 500-1,000 mg by mouth every 6 (six) hours as needed (for pain.).     amiodarone (PACERONE) 200 MG tablet Take 1 tablet (200 mg total) by mouth daily. 90 tablet 3   candesartan (ATACAND) 8 MG tablet TAKE 1/2 TABLET (4 MG TOTAL) BY MOUTH 2 (TWO) TIMES DAILY. 90 tablet 3   carboxymethylcellul-glycerin (OPTIVE) 0.5-0.9 % ophthalmic solution Place 1 drop into both eyes daily. Refresh     cetirizine (ZYRTEC) 10 MG tablet Take 10 mg by mouth daily as needed for allergies.     Colchicine (MITIGARE) 0.6 MG CAPS Take 0.6 mg by mouth 2 (two) times daily as needed (gout flare). 60  capsule 0   dapagliflozin propanediol (FARXIGA) 10 MG TABS tablet Take 1 tablet (10 mg total) by mouth daily. (Take BEFORE BREAKFAST) 90 tablet 3   ELIQUIS 5 MG TABS tablet TAKE 1 TABLET BY MOUTH TWICE A DAY 180 tablet 1   furosemide (LASIX) 40 MG tablet TAKE 1 TABLET BY MOUTH EVERY DAY (Patient taking differently: Take 40 mg by mouth every other day.) 90 tablet 3   levothyroxine (SYNTHROID) 88 MCG tablet Take 1 tablet (88 mcg total) by mouth daily before breakfast. 90 tablet 1   mexiletine (MEXITIL) 250 MG capsule TAKE 1 CAPSULE BY MOUTH TWICE A DAY 180 capsule 1   nitroGLYCERIN (NITROSTAT) 0.4 MG SL tablet Place 1 tablet (0.4 mg total) under the tongue every 5 (five) minutes x 3 doses as needed for chest pain. 25 tablet 3   Omega-3 Fatty Acids (FISH OIL) 1200 MG CAPS Take 3,600 mg by mouth daily.     rosuvastatin (CRESTOR) 40 MG tablet Take 1 tablet (40 mg total) by mouth daily. 90 tablet 2   spironolactone (ALDACTONE) 25 MG tablet Take 0.5 tablets (12.5 mg total) by mouth daily. 45 tablet 3   No current facility-administered medications for this visit.     Past Medical History:  Diagnosis Date   AICD (automatic cardioverter/defibrillator) present 05/2008   St. Jude   Allergy    Arthritis  Bradycardia    Use of beta blocker limited   CAD (coronary artery disease)    a- S/p cabg in march 2006 by Dr.Owen;  b. Celine Ahr 5/12: large IL scar from apex to base, no ischemia, EF 37%   Cancer (HCC)    on right side of neck   Clotting disorder [facility])    ED (erectile dysfunction)    Gout    Hyperlipidemia    Ischemic cardiomyopathy    a-Echocardiogram, Nov 2006, showed ejection fraction of 30-40% with mild to moderated mitral  regurgitation and mild aortic regurgitation b- Cardiac MRI, May 2007, EF of 44% with 50% scar involving  the inferolateral walls. No comment of mitral regurgitation. c- last echo  11/10: EF 30-35%  mod MR d- Nega T-Wave alternans testing in May of 2007 e- no ACE due to  low BP;  f. CPX 3/11: normal   RBBB (right bundle branch block with left anterior fascicular block)    Systolic CHF, chronic (HCC)    Thyroid disease     ROS:   All systems reviewed and negative except as noted in the HPI.   Past Surgical History:  Procedure Laterality Date   CARDIAC DEFIBRILLATOR PLACEMENT  06/01/08   ICD   COLONOSCOPY     COLONOSCOPY WITH PROPOFOL N/A 07/28/2019   Procedure: COLONOSCOPY WITH PROPOFOL;  Surgeon: Iva Boop, MD;  Location: [facility] ENDOSCOPY;  Service: Endoscopy;  Laterality: N/A;   CORONARY ARTERY BYPASS GRAFT  02-2005   ICD GENERATOR CHANGEOUT N/A 02/03/2017   Procedure: ICD Generator Changeout;  Surgeon: Marinus Maw, MD;  Location: [facility] INVASIVE CV LAB;  Service: Cardiovascular;  Laterality: N/A;   ORIF ANKLE FRACTURE  1987   left    RIGHT/LEFT HEART CATH AND CORONARY/GRAFT ANGIOGRAPHY N/A 09/20/2019   Procedure: RIGHT/LEFT HEART CATH AND CORONARY/GRAFT ANGIOGRAPHY;  Surgeon: Dolores Patty, MD;  Location: [facility] INVASIVE CV LAB;  Service: Cardiovascular;  Laterality: N/A;   SHOULDER ARTHROSCOPY  02/2010   rt    TEE WITHOUT CARDIOVERSION N/A 10/24/2019   Procedure: TRANSESOPHAGEAL ECHOCARDIOGRAM (TEE);  Surgeon: Dolores Patty, MD;  Location: [facility] ENDOSCOPY;  Service: Cardiovascular;  Laterality: N/A;   TONSILLECTOMY     as a child     Family History  Problem Relation Age of Onset   Heart attack Father 78   Cardiomyopathy Brother    Early death Brother    Heart disease Brother    Colon cancer Neg Hx    Prostate cancer Neg Hx    Diabetes Neg Hx      Social History   Socioeconomic History   Marital status: Married    Spouse name: Not on file   Number of children: 2   Years of education: Not on file   Highest education level: Bachelor's degree (e.g., BA, AB, BS)  Occupational History   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     Employer: [employer]  Tobacco Use    Smoking status: Former    Current packs/day: 0.00    Types: Cigarettes    Quit date: 12/16/1999    Years since quitting: 23.9   Smokeless tobacco: Never  Vaping Use   Vaping status: Never Used  Substance and Sexual Activity   Alcohol use: Not Currently   Drug use: Never   Sexual activity: Not Currently  Other Topics Concern   Not on file  Social History Narrative   Retired    household- pt and  wife   2 adult children in [place] also has grandchildren   Former but not current smoker, occasional alcohol no drug use   Social Determinants of Corporate investment banker Strain: Low Risk  (10/27/2023)   Overall Financial Resource Strain (CARDIA)    Difficulty of Paying Living Expenses: Not very hard  Food Insecurity: No Food Insecurity (10/27/2023)   Hunger Vital Sign    Worried About Running Out of Food in the Last Year: Never true    Ran Out of Food in the Last Year: Never true  Transportation Needs: No Transportation Needs (10/27/2023)   PRAPARE - Administrator, Civil Service (Medical): No    Lack of Transportation (Non-Medical): No  Physical Activity: Sufficiently Active (10/27/2023)   Exercise Vital Sign    Days of Exercise per Week: 6 days    Minutes of Exercise per Session: 50 min  Stress: No Stress Concern Present (10/27/2023)   [employer] of Occupational Health - Occupational Stress Questionnaire    Feeling of Stress : Only a little  Social Connections: Socially Integrated (10/27/2023)   Social Connection and Isolation Panel [NHANES]    Frequency of Communication with Friends and Family: More than three times a week    Frequency of Social Gatherings with Friends and Family: More than three times a week    Attends Religious Services: More than 4 times per year    Active Member of [employer] or Organizations: Yes    Attends Engineer, structural: More than 4 times per year    Marital Status: Married  Catering manager Violence: Not At Risk (10/28/2023)    Humiliation, Afraid, Rape, and Kick questionnaire    Fear of Current or Ex-Partner: No    Emotionally Abused: No    Physically Abused: No    Sexually Abused: No     BP 116/60   Pulse (!) 46   Ht 5\' 9"  (1.753 m)   Wt 145 lb (65.8 kg)   SpO2 99%   BMI 21.41 kg/m   Physical Exam:  Well appearing NAD HEENT: Unremarkable Neck:  No JVD, no thyromegally Lymphatics:  No adenopathy Back:  No CVA tenderness Lungs:  Clear with no wheezes HEART:  Regular rate rhythm, no murmurs, no rubs, no clicks Abd:  soft, positive bowel sounds, no organomegally, no rebound, no guarding Ext:  2 plus pulses, no edema, no cyanosis, no clubbing Skin:  No rashes no nodules Neuro:  CN II through XII intact, motor grossly intact  EKG - NSR  DEVICE  Normal device function.  See PaceArt for details.  ICD rate sense lead has failed.   Assess/Plan:  1.  Recurrent ventricular tachycardia -his ventricular tachycardia appears to be fairly well controlled on a combination of amiodarone and mexiletine.  He will undergo watchful waiting.  For now I would not recommend VT ablation unless his ventricular tachycardia becomes incessant and slower on medical therapy. 2.  Chronic systolic heart failure - his heart failure symptoms are class II.  He will continue his current medical therapy.  He is encouraged to maintain a low-sodium diet. 3.  Coronary artery disease -he is status post bypass grafting and has no anginal symptoms. 4.  Paroxysmal atrial fibrillation -the patient is maintaining sinus rhythm.  No change in medications. 5. ICD lead malfunction -He has had an inappropriate shock due to noise and will undergo PM lead revision. If his vein is open, he will undergo insertion of a new device and  lead on the ipsilateral side. If the vein is occuded would plan for move to contral lateral side.   Glean Hess, MD

## 2023-11-07 LAB — CBC
Hematocrit: 43.4 % (ref 37.5–51.0)
Hemoglobin: 13.6 g/dL (ref 13.0–17.7)
MCH: 29.2 pg (ref 26.6–33.0)
MCHC: 31.3 g/dL — ABNORMAL LOW (ref 31.5–35.7)
MCV: 93 fL (ref 79–97)
Platelets: 206 10*3/uL (ref 150–450)
RBC: 4.65 x10E6/uL (ref 4.14–5.80)
RDW: 12 % (ref 11.6–15.4)
WBC: 6.7 10*3/uL (ref 3.4–10.8)

## 2023-11-07 LAB — BASIC METABOLIC PANEL
BUN/Creatinine Ratio: 15 (ref 10–24)
BUN: 15 mg/dL (ref 8–27)
CO2: 26 mmol/L (ref 20–29)
Calcium: 9.5 mg/dL (ref 8.6–10.2)
Chloride: 107 mmol/L — ABNORMAL HIGH (ref 96–106)
Creatinine, Ser: 0.98 mg/dL (ref 0.76–1.27)
Glucose: 91 mg/dL (ref 70–99)
Potassium: 4.2 mmol/L (ref 3.5–5.2)
Sodium: 145 mmol/L — ABNORMAL HIGH (ref 134–144)
eGFR: 81 mL/min/{1.73_m2} (ref >59)

## 2023-11-08 NOTE — Pre-Procedure Instructions (Signed)
Attempted to call patient regarding procedure instructions.  Left voicemail on the following items: Arrival time 1:00 Nothing to eat or drink after midnight No meds AM of procedure Responsible person to drive you home and stay with you for 24 hrs Wash with special soap night before and morning of procedure If on anti-coagulant drug instructions Eliquis-last dose 11/22

## 2023-11-09 ENCOUNTER — Other Ambulatory Visit: Payer: Self-pay

## 2023-11-09 ENCOUNTER — Encounter (HOSPITAL_COMMUNITY)
Admission: RE | Disposition: A | Discharge status: Home / Self Care | Payer: Self-pay | Source: Home / Self Care | Attending: Internal Medicine

## 2023-11-09 ENCOUNTER — Ambulatory Visit (HOSPITAL_COMMUNITY)
Admission: RE | Admit: 2023-11-09 | Discharge: 2023-11-10 | Disposition: A | Discharge status: Home / Self Care | Payer: Medicare HMO | Attending: Internal Medicine | Admitting: Internal Medicine

## 2023-11-09 DIAGNOSIS — I480 Paroxysmal atrial fibrillation: Secondary | ICD-10-CM | POA: Insufficient documentation

## 2023-11-09 DIAGNOSIS — I2510 Atherosclerotic heart disease of native coronary artery without angina pectoris: Secondary | ICD-10-CM | POA: Diagnosis not present

## 2023-11-09 DIAGNOSIS — Z87891 Personal history of nicotine dependence: Secondary | ICD-10-CM | POA: Insufficient documentation

## 2023-11-09 DIAGNOSIS — Z8249 Family history of ischemic heart disease and other diseases of the circulatory system: Secondary | ICD-10-CM | POA: Diagnosis not present

## 2023-11-09 DIAGNOSIS — I5022 Chronic systolic (congestive) heart failure: Secondary | ICD-10-CM | POA: Diagnosis not present

## 2023-11-09 DIAGNOSIS — Z951 Presence of aortocoronary bypass graft: Secondary | ICD-10-CM | POA: Diagnosis not present

## 2023-11-09 DIAGNOSIS — I255 Ischemic cardiomyopathy: Secondary | ICD-10-CM | POA: Diagnosis not present

## 2023-11-09 DIAGNOSIS — I472 Ventricular tachycardia, unspecified: Secondary | ICD-10-CM | POA: Insufficient documentation

## 2023-11-09 DIAGNOSIS — T82110A Breakdown (mechanical) of cardiac electrode, initial encounter: Principal | ICD-10-CM | POA: Diagnosis present

## 2023-11-09 DIAGNOSIS — Z4502 Encounter for adjustment and management of automatic implantable cardiac defibrillator: Secondary | ICD-10-CM | POA: Diagnosis not present

## 2023-11-09 DIAGNOSIS — T82120A Displacement of cardiac electrode, initial encounter: Secondary | ICD-10-CM | POA: Diagnosis not present

## 2023-11-09 DIAGNOSIS — T82110S Breakdown (mechanical) of cardiac electrode, sequela: Secondary | ICD-10-CM | POA: Diagnosis not present

## 2023-11-09 HISTORY — PX: ICD GENERATOR CHANGEOUT: EP1231

## 2023-11-09 HISTORY — PX: LEAD REVISION/REPAIR: EP1213

## 2023-11-09 SURGERY — ICD GENERATOR CHANGEOUT
Anesthesia: LOCAL

## 2023-11-09 MED ORDER — SODIUM CHLORIDE 0.9 % IV SOLN
INTRAVENOUS | Status: AC
  Filled 2023-11-09: qty 2

## 2023-11-09 MED ORDER — SODIUM CHLORIDE 0.9 % IV SOLN
80.0000 mg | INTRAVENOUS | Status: AC
  Administered 2023-11-09: 80 mg

## 2023-11-09 MED ORDER — CHLORHEXIDINE GLUCONATE 4 % EX SOLN
4.0000 | Freq: Once | CUTANEOUS | Status: AC
  Administered 2023-11-09: 4 via TOPICAL
  Filled 2023-11-09: qty 60

## 2023-11-09 MED ORDER — FENTANYL CITRATE (PF) 100 MCG/2ML IJ SOLN
INTRAMUSCULAR | Status: AC
  Filled 2023-11-09: qty 2

## 2023-11-09 MED ORDER — AMIODARONE HCL 200 MG PO TABS
200.0000 mg | ORAL_TABLET | Freq: Every day | ORAL | Status: DC
  Administered 2023-11-09 – 2023-11-10 (×2): 200 mg via ORAL
  Filled 2023-11-09 (×2): qty 1

## 2023-11-09 MED ORDER — FENTANYL CITRATE (PF) 100 MCG/2ML IJ SOLN
INTRAMUSCULAR | Status: DC | PRN
  Administered 2023-11-09: 12.5 ug via INTRAVENOUS
  Administered 2023-11-09: 25 ug via INTRAVENOUS

## 2023-11-09 MED ORDER — CEFAZOLIN SODIUM-DEXTROSE 1-4 GM/50ML-% IV SOLN
1.0000 g | Freq: Four times a day (QID) | INTRAVENOUS | Status: DC
  Filled 2023-11-09 (×3): qty 50

## 2023-11-09 MED ORDER — CEFAZOLIN SODIUM-DEXTROSE 2-4 GM/100ML-% IV SOLN
2.0000 g | INTRAVENOUS | Status: AC
  Administered 2023-11-09: 2 g via INTRAVENOUS

## 2023-11-09 MED ORDER — MEXILETINE HCL 250 MG PO CAPS
250.0000 mg | ORAL_CAPSULE | Freq: Two times a day (BID) | ORAL | Status: DC
Start: 2023-11-09 — End: 2023-11-10
  Administered 2023-11-09 – 2023-11-10 (×2): 250 mg via ORAL
  Filled 2023-11-09 (×3): qty 1

## 2023-11-09 MED ORDER — SODIUM CHLORIDE 0.9 % IV SOLN: INTRAVENOUS | Status: DC

## 2023-11-09 MED ORDER — HEPARIN (PORCINE) IN NACL 1000-0.9 UT/500ML-% IV SOLN
INTRAVENOUS | Status: DC | PRN
  Administered 2023-11-09: 500 mL

## 2023-11-09 MED ORDER — LIDOCAINE HCL (PF) 1 % IJ SOLN
INTRAMUSCULAR | Status: AC
  Filled 2023-11-09: qty 60

## 2023-11-09 MED ORDER — ACETAMINOPHEN 325 MG PO TABS
325.0000 mg | ORAL_TABLET | ORAL | Status: DC | PRN
Start: 2023-11-09 — End: 2023-11-10
  Administered 2023-11-09: 650 mg via ORAL
  Filled 2023-11-09: qty 2

## 2023-11-09 MED ORDER — CEFAZOLIN SODIUM-DEXTROSE 2-4 GM/100ML-% IV SOLN
INTRAVENOUS | Status: AC
  Filled 2023-11-09: qty 100

## 2023-11-09 MED ORDER — POVIDONE-IODINE 10 % EX SWAB
2.0000 | Freq: Once | CUTANEOUS | Status: AC
  Administered 2023-11-09: 2 via TOPICAL

## 2023-11-09 MED ORDER — IOHEXOL 350 MG/ML SOLN
INTRAVENOUS | Status: DC | PRN
  Administered 2023-11-09: 10 mL

## 2023-11-09 MED ORDER — LIDOCAINE HCL (PF) 1 % IJ SOLN
INTRAMUSCULAR | Status: DC | PRN
  Administered 2023-11-09: 50 mL

## 2023-11-09 MED ORDER — SPIRONOLACTONE 12.5 MG HALF TABLET
12.5000 mg | ORAL_TABLET | Freq: Every day | ORAL | Status: DC
  Administered 2023-11-09 – 2023-11-10 (×2): 12.5 mg via ORAL
  Filled 2023-11-09 (×2): qty 1

## 2023-11-09 MED ORDER — MIDAZOLAM HCL 5 MG/5ML IJ SOLN
INTRAMUSCULAR | Status: AC
  Filled 2023-11-09: qty 5

## 2023-11-09 MED ORDER — MIDAZOLAM HCL 5 MG/5ML IJ SOLN
INTRAMUSCULAR | Status: DC | PRN
  Administered 2023-11-09: 2 mg via INTRAVENOUS
  Administered 2023-11-09: 1 mg via INTRAVENOUS

## 2023-11-09 MED ORDER — CEFAZOLIN SODIUM-DEXTROSE 1-4 GM/50ML-% IV SOLN
1.0000 g | Freq: Four times a day (QID) | INTRAVENOUS | Status: DC
  Filled 2023-11-09: qty 50

## 2023-11-09 MED ORDER — ONDANSETRON HCL 4 MG/2ML IJ SOLN: 4.0000 mg | Freq: Four times a day (QID) | INTRAMUSCULAR | Status: DC | PRN

## 2023-11-09 MED ORDER — FUROSEMIDE 40 MG PO TABS
40.0000 mg | ORAL_TABLET | Freq: Every day | ORAL | Status: DC
  Filled 2023-11-09 (×2): qty 1

## 2023-11-09 SURGICAL SUPPLY — 11 items
CABLE SURGICAL S-101-97-12 (CABLE) ×1 IMPLANT
CAP LEAD ASSY DEFIB 4033A (Neurosurgery Supplies) IMPLANT
GUIDEWIRE ANGLED .035X150CM (WIRE) IMPLANT
ICD GALLANT VR CDVRA500Q (ICD Generator) IMPLANT
KIT MICROPUNCTURE NIT STIFF (SHEATH) IMPLANT
LEAD DURATA 7122Q-65CM (Lead) IMPLANT
PAD DEFIB RADIO PHYSIO CONN (PAD) ×1 IMPLANT
POUCH AIGIS-R ANTIBACT ICD (Mesh General) ×1 IMPLANT
POUCH AIGIS-R ANTIBACT ICD LRG (Mesh General) IMPLANT
SHEATH 8FR PRELUDE SNAP 13 (SHEATH) IMPLANT
TRAY PACEMAKER INSERTION (PACKS) ×1 IMPLANT

## 2023-11-09 NOTE — Interval H&P Note (Signed)
History and Physical Interval Note:  11/09/2023 12:58 PM  Matthew Spore D Mella Sr.  has presented today for surgery, with the diagnosis of lead malfunction.  The various methods of treatment have been discussed with the patient and family. After consideration of risks, benefits and other options for treatment, the patient has consented to  Procedure(s): ICD GENERATOR CHANGEOUT (N/A) LEAD REVISION/REPAIR (N/A) as a surgical intervention.  The patient's history has been reviewed, patient examined, no change in status, stable for surgery.  I have reviewed the patient's chart and labs.  Questions were answered to the patient's satisfaction.     Lewayne Kohrs

## 2023-11-09 NOTE — Progress Notes (Signed)
Patient brought to 4E from cath lab. VSS. Telemetry box applied, CCMD notified. Patient oriented to room and staff. Call bell in reach. Wife present.  Kenard Gower, RN

## 2023-11-10 ENCOUNTER — Ambulatory Visit (HOSPITAL_COMMUNITY): Payer: Medicare HMO

## 2023-11-10 ENCOUNTER — Encounter (HOSPITAL_COMMUNITY): Payer: Self-pay | Admitting: Internal Medicine

## 2023-11-10 DIAGNOSIS — T82110S Breakdown (mechanical) of cardiac electrode, sequela: Secondary | ICD-10-CM | POA: Diagnosis not present

## 2023-11-10 DIAGNOSIS — I5022 Chronic systolic (congestive) heart failure: Secondary | ICD-10-CM | POA: Diagnosis not present

## 2023-11-10 DIAGNOSIS — I472 Ventricular tachycardia, unspecified: Secondary | ICD-10-CM | POA: Diagnosis not present

## 2023-11-10 DIAGNOSIS — I2510 Atherosclerotic heart disease of native coronary artery without angina pectoris: Secondary | ICD-10-CM | POA: Diagnosis not present

## 2023-11-10 DIAGNOSIS — Z950 Presence of cardiac pacemaker: Secondary | ICD-10-CM | POA: Diagnosis not present

## 2023-11-10 DIAGNOSIS — Z951 Presence of aortocoronary bypass graft: Secondary | ICD-10-CM | POA: Diagnosis not present

## 2023-11-10 DIAGNOSIS — I480 Paroxysmal atrial fibrillation: Secondary | ICD-10-CM | POA: Diagnosis not present

## 2023-11-10 DIAGNOSIS — Z8249 Family history of ischemic heart disease and other diseases of the circulatory system: Secondary | ICD-10-CM | POA: Diagnosis not present

## 2023-11-10 DIAGNOSIS — Z87891 Personal history of nicotine dependence: Secondary | ICD-10-CM | POA: Diagnosis not present

## 2023-11-10 DIAGNOSIS — Z4502 Encounter for adjustment and management of automatic implantable cardiac defibrillator: Secondary | ICD-10-CM | POA: Diagnosis not present

## 2023-11-10 DIAGNOSIS — I255 Ischemic cardiomyopathy: Secondary | ICD-10-CM | POA: Diagnosis not present

## 2023-11-10 DIAGNOSIS — T82120A Displacement of cardiac electrode, initial encounter: Secondary | ICD-10-CM | POA: Diagnosis not present

## 2023-11-10 DIAGNOSIS — T82110A Breakdown (mechanical) of cardiac electrode, initial encounter: Secondary | ICD-10-CM | POA: Diagnosis not present

## 2023-11-10 NOTE — Progress Notes (Signed)
Mobility Specialist Progress Note:   11/10/23 1030  Mobility  Activity Ambulated with assistance in hallway  Level of Assistance Independent after set-up  Assistive Device None  Distance Ambulated (ft) 470 ft  LUE Weight Bearing NWB  Activity Response Tolerated well  Mobility Referral Yes  $Mobility charge 1 Mobility  Mobility Specialist Start Time (ACUTE ONLY) 1010  Mobility Specialist Stop Time (ACUTE ONLY) 1025  Mobility Specialist Time Calculation (min) (ACUTE ONLY) 15 min   Pre Mobility: 46 HR  During Mobility: 55 HR Post Mobility: 48 HR  Pt received in bed, agreeable to mobility. Denied any discomfort during ambulation, asx throughout. Pt left in chair with call bell in reach and all needs met.   Leory Plowman  Mobility Specialist Please contact via [employer] office at [phone]

## 2023-11-10 NOTE — Progress Notes (Signed)
Patient given discharge instructions. Wife present. PIV removed. Telemetry box removed, CCMD notified. Patient taken to vehicle in wheelchair by staff. ? ?Kenard Gower, RN  ?

## 2023-11-10 NOTE — Discharge Summary (Addendum)
ELECTROPHYSIOLOGY PROCEDURE DISCHARGE SUMMARY    Patient ID: Matthew Dudak.,  MRN: [id number], DOB/AGE: [birth year] 72 y.o.  Admit date: 11/09/2023 Discharge date: 11/10/2023  Primary Care Physician: Wanda Plump, MD  Primary Cardiologist: None  Electrophysiologist: Dr. Ladona Ridgel    Primary Diagnosis:  Chronic systolic CHF, NYHA II  Recurrent Ventricular Tachycardia  ICD lead malfunction   Secondary Diagnosis: Coronary Artery Disease  Paroxysmal atrial fibrillation    Allergies  Allergen Reactions   Atorvastatin      myalgia, Muscle aching   Entresto [Sacubitril-Valsartan] Itching    Had itching and redness   Sulfonamide Derivatives     Rash and hot flashes   Sulfamethoxazole Rash and Other (See Comments)    Hot flashes   Tape Other (See Comments)    Rips skin- use PAPER tape     Procedures This Admission:  1.  Implantation of a Abbott new ICD lead due to fracture, new ICD generator and removal of ICD with out complication.  2.  CXR on 11/26 demonstrated no pneumothorax status post device implantation.      Brief HPI:  Matthew SHOWELL Sr. is a 72 y.o. male was referred to electrophysiology in the outpatient setting for consideration of ICD implantation.  Past medical history includes above.  The patient has persistent LV dysfunction despite guideline directed therapy.  Risks, benefits, and alternatives to ICD implantation were reviewed with the patient who wished to proceed.   Hospital Course:  The patient was admitted and underwent implantation of a Abbott new ICD lead due to fracture, new ICD generator and removal of ICD with details as outlined above. They were monitored on telemetry overnight which demonstrated SB/NSR .  Left chest was without hematoma or ecchymosis.  The device was interrogated and found to be functioning normally.  CXR was obtained and demonstrated no pneumothorax status post device implantation..  Wound care, arm mobility, and  restrictions were reviewed with the patient.  The patient was examined and considered stable for discharge to home.   Anticoagulation resumption This patient should resume their Eliquis on 11/14/23  Physical Exam: Vitals:   11/09/23 2249 11/10/23 0604 11/10/23 0700 11/10/23 0735  BP: (!) 113/57 119/64  116/65  Pulse: (!) 45 (!) 47  (!) 41  Resp: 16 14  17   Temp: (!) 97.5 F (36.4 C) 98.1 F (36.7 C) (!) 95 F (35 C) 97.6 F (36.4 C)  TempSrc: Oral Oral  Oral  SpO2: 96% 97%  98%  Weight:  66.8 kg    Height:        GEN- NAD. A&O x 3.  HEENT: Normocephalic, atraumatic Lungs- CTAB, normal effort.  Heart- RRR. No M/G/R.  GI- Soft, NT, ND.  Extremities- No clubbing, cyanosis, or edema Skin- Warm and dry, no rash or lesion. ICD site clean/dry without swelling or erythema  Discharge Medications:  Allergies as of 11/10/2023       Reactions   Atorvastatin     myalgia, Muscle aching   Entresto [sacubitril-valsartan] Itching   Had itching and redness   Sulfonamide Derivatives    Rash and hot flashes   Sulfamethoxazole Rash, Other (See Comments)   Hot flashes   Tape Other (See Comments)   Rips skin- use PAPER tape        Medication List     TAKE these medications    acetaminophen 500 MG tablet Commonly known as: TYLENOL Take 500-1,000 mg by mouth every 6 (six) hours as  needed (for pain.).   amiodarone 200 MG tablet Commonly known as: PACERONE Take 1 tablet (200 mg total) by mouth daily.   candesartan 8 MG tablet Commonly known as: ATACAND TAKE 1/2 TABLET (4 MG TOTAL) BY MOUTH 2 (TWO) TIMES DAILY.   cetirizine 10 MG tablet Commonly known as: ZYRTEC Take 10 mg by mouth daily as needed for allergies.   Colchicine 0.6 MG Caps Commonly known as: Mitigare Take 0.6 mg by mouth 2 (two) times daily as needed (gout flare).   dapagliflozin propanediol 10 MG Tabs tablet Commonly known as: Farxiga Take 1 tablet (10 mg total) by mouth daily. (Take BEFORE BREAKFAST)    Eliquis 5 MG Tabs tablet Generic drug: apixaban TAKE 1 TABLET BY MOUTH TWICE A DAY Notes to patient: Ok to restart on 11/30   Fish Oil 1200 MG Caps Take 3,600 mg by mouth daily.   furosemide 40 MG tablet Commonly known as: LASIX TAKE 1 TABLET BY MOUTH EVERY DAY What changed: when to take this   levothyroxine 88 MCG tablet Commonly known as: SYNTHROID Take 1 tablet (88 mcg total) by mouth daily before breakfast.   mexiletine 250 MG capsule Commonly known as: MEXITIL TAKE 1 CAPSULE BY MOUTH TWICE A DAY   nitroGLYCERIN 0.4 MG SL tablet Commonly known as: NITROSTAT Place 1 tablet (0.4 mg total) under the tongue every 5 (five) minutes x 3 doses as needed for chest pain.   OPTIVE 0.5-0.9 % ophthalmic solution Generic drug: carboxymethylcellul-glycerin Place 1 drop into both eyes daily. Refresh   rosuvastatin 40 MG tablet Commonly known as: CRESTOR Take 1 tablet (40 mg total) by mouth daily.   spironolactone 25 MG tablet Commonly known as: ALDACTONE Take 0.5 tablets (12.5 mg total) by mouth daily.        Disposition: Home with usual follow up as in AVS  Duration of Discharge Encounter: Greater than 30 minutes including physician time.  Signed, Canary Brim, MSN, APRN, NP-C, AGACNP-BC [facility] - Electrophysiology  11/10/2023, 7:10 PM  EP Attending  Patient seen and examined. Agree with the findings as noted above. The patient is s/p ICD lead insertion and gen change out. Her device interrogation shows normal ICD function. He will be discharged home. Usual followup.   Sharlot Gowda Devonda Pequignot,MD

## 2023-11-10 NOTE — Plan of Care (Signed)
Problem: Education: Goal: Knowledge of cardiac device and self-care will improve Outcome: Progressing Goal: Ability to safely manage health related needs after discharge will improve Outcome: Progressing Goal: Individualized Educational Video(s) Outcome: Progressing   Problem: Cardiac: Goal: Ability to achieve and maintain adequate cardiopulmonary perfusion will improve Outcome: Progressing

## 2023-11-16 NOTE — Progress Notes (Unsigned)
This encounter was created in error - please disregard.

## 2023-11-23 NOTE — Progress Notes (Signed)
Remote ICD transmission.   

## 2023-11-25 ENCOUNTER — Ambulatory Visit: Payer: Medicare HMO | Attending: Cardiology

## 2023-11-25 DIAGNOSIS — I5022 Chronic systolic (congestive) heart failure: Secondary | ICD-10-CM

## 2023-11-25 DIAGNOSIS — I255 Ischemic cardiomyopathy: Secondary | ICD-10-CM

## 2023-11-25 DIAGNOSIS — I472 Ventricular tachycardia, unspecified: Secondary | ICD-10-CM

## 2023-11-25 DIAGNOSIS — I480 Paroxysmal atrial fibrillation: Secondary | ICD-10-CM

## 2023-11-25 LAB — CUP PACEART INCLINIC DEVICE CHECK
Battery Remaining Longevity: 123 mo
Brady Statistic RV Percent Paced: 0.49 %
Date Time Interrogation Session: 20241211124014
HighPow Impedance: 54 Ohm
Implantable Lead Connection Status: 753985
Implantable Lead Implant Date: 20241125
Implantable Lead Location: 753860
Implantable Pulse Generator Implant Date: 20241125
Lead Channel Impedance Value: 437.5 Ohm
Lead Channel Pacing Threshold Amplitude: 1 V
Lead Channel Pacing Threshold Amplitude: 1 V
Lead Channel Pacing Threshold Pulse Width: 0.5 ms
Lead Channel Pacing Threshold Pulse Width: 0.5 ms
Lead Channel Sensing Intrinsic Amplitude: 12 mV
Lead Channel Setting Pacing Amplitude: 2.5 V
Lead Channel Setting Pacing Pulse Width: 0.5 ms
Lead Channel Setting Sensing Sensitivity: 0.5 mV
Pulse Gen Serial Number: 211016570

## 2023-11-25 NOTE — Patient Instructions (Signed)

## 2023-11-25 NOTE — Progress Notes (Unsigned)

## 2023-11-30 ENCOUNTER — Telehealth: Payer: Self-pay | Admitting: Internal Medicine

## 2023-11-30 NOTE — Telephone Encounter (Signed)
Pt states he received a letter prior to ICD implant stating that [employer] approved of the surgery. He states he received a letter on Saturday now they will not approve that surgery. He states they also sent a letter out to Dr. Ladona Ridgel about this. Please advise.

## 2023-11-30 NOTE — Telephone Encounter (Signed)
The pt wants  someone from billing to give him a call about the letter he received from [place].

## 2023-12-01 ENCOUNTER — Ambulatory Visit: Payer: Medicare HMO | Attending: Internal Medicine

## 2023-12-01 ENCOUNTER — Other Ambulatory Visit: Payer: Self-pay

## 2023-12-01 DIAGNOSIS — R1312 Dysphagia, oropharyngeal phase: Secondary | ICD-10-CM | POA: Insufficient documentation

## 2023-12-01 DIAGNOSIS — R1319 Other dysphagia: Secondary | ICD-10-CM

## 2023-12-01 NOTE — Therapy (Signed)
OUTPATIENT SPEECH LANGUAGE PATHOLOGY DYSPHAGIA TREATMENT/RECERTIFICATION   Patient Name: Matthew QUINNEY Sr. MRN: [id number] DOB:04-18-1951, 72 y.o., male Today's Date: 12/01/2023  PCP: Willow Ora, MD  REFERRING PROVIDER: Iva Boop, MD  END OF SESSION:  End of Session - 12/01/23 0811     Visit Number 8    Number of Visits 17    Date for SLP Re-Evaluation 01/08/24    SLP Start Time 0807    SLP Stop Time  0845    SLP Time Calculation (min) 38 min    Activity Tolerance Patient tolerated treatment well                 Past Medical History:  Diagnosis Date   AICD (automatic cardioverter/defibrillator) present 05/2008   [employer]   Allergy    Arthritis    Bradycardia    Use of beta blocker limited   CAD (coronary artery disease)    a- S/p cabg in march 2006 by Dr.Owen;  b. myoview 5/12: large IL scar from apex to base, no ischemia, EF 37%   Cancer (HCC)    on right side of neck   Clotting disorder [facility])    ED (erectile dysfunction)    Gout    Hyperlipidemia    Ischemic cardiomyopathy    a-Echocardiogram, Nov 2006, showed ejection fraction of 30-40% with mild to moderated mitral  regurgitation and mild aortic regurgitation b- Cardiac MRI, May 2007, EF of 44% with 50% scar involving  the inferolateral walls. No comment of mitral regurgitation. c- last echo  11/10: EF 30-35%  mod MR d- Nega T-Wave alternans testing in May of 2007 e- no ACE due to low BP;  f. CPX 3/11: normal   RBBB (right bundle branch block with left anterior fascicular block)    Systolic CHF, chronic (HCC)    Thyroid disease    Past Surgical History:  Procedure Laterality Date   CARDIAC DEFIBRILLATOR PLACEMENT  06/01/08   ICD   COLONOSCOPY     COLONOSCOPY WITH PROPOFOL N/A 07/28/2019   Procedure: COLONOSCOPY WITH PROPOFOL;  Surgeon: Iva Boop, MD;  Location: [facility] ENDOSCOPY;  Service: Endoscopy;  Laterality: N/A;   CORONARY ARTERY BYPASS GRAFT  02-2005   ICD GENERATOR CHANGEOUT N/A 02/03/2017    Procedure: ICD Generator Changeout;  Surgeon: Marinus Maw, MD;  Location: [facility] INVASIVE CV LAB;  Service: Cardiovascular;  Laterality: N/A;   ICD GENERATOR CHANGEOUT N/A 11/09/2023   Procedure: ICD GENERATOR CHANGEOUT;  Surgeon: Marinus Maw, MD;  Location: [facility] INVASIVE CV LAB;  Service: Cardiovascular;  Laterality: N/A;   LEAD REVISION/REPAIR N/A 11/09/2023   Procedure: LEAD REVISION/REPAIR;  Surgeon: Marinus Maw, MD;  Location: [facility] INVASIVE CV LAB;  Service: Cardiovascular;  Laterality: N/A;   ORIF ANKLE FRACTURE  1987   left    RIGHT/LEFT HEART CATH AND CORONARY/GRAFT ANGIOGRAPHY N/A 09/20/2019   Procedure: RIGHT/LEFT HEART CATH AND CORONARY/GRAFT ANGIOGRAPHY;  Surgeon: Dolores Patty, MD;  Location: [facility] INVASIVE CV LAB;  Service: Cardiovascular;  Laterality: N/A;   SHOULDER ARTHROSCOPY  02/2010   rt    TEE WITHOUT CARDIOVERSION N/A 10/24/2019   Procedure: TRANSESOPHAGEAL ECHOCARDIOGRAM (TEE);  Surgeon: Dolores Patty, MD;  Location: [facility] ENDOSCOPY;  Service: Cardiovascular;  Laterality: N/A;   TONSILLECTOMY     as a child   Patient Active Problem List   Diagnosis Date Noted   ICD (implantable cardioverter-defibrillator) lead failure 11/09/2023   Presence of implantable cardioverter-defibrillator (ICD) 06/26/2022   VT (  ventricular tachycardia) (HCC) 07/02/2021   Paroxysmal atrial fibrillation (HCC) 07/02/2021   Heme + stool    Internal and external prolapsed hemorrhoids    Difficulty swallowing 01/21/2018   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 (HCC) 12/24/2017   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 [facility]) 11/12/2017   PCP NOTES >>>>>>>>>>>>>>>>>>>>> 04/21/2016   Seborrheic dermatitis of scalp 04/11/2013   Annual physical exam 04/11/2011   MITRAL REGURGITATION 04/10/2010   SYSTOLIC HEART FAILURE, CHRONIC 10/03/2009   Hyperglycemia 06/19/2008   Coronary atherosclerosis 06/14/2008    Automatic implantable cardioverter-defibrillator in situ 06/01/2008   ERECTILE DYSFUNCTION 12/17/2007   Hyperlipidemia 02/06/2007   Gout 02/06/2007   RBBB 02/06/2007   Congestive heart failure (HCC) 02/06/2007   CORONARY ARTERY BYPASS GRAFT, FOUR VESSEL, HX OF 02/12/2005   Speech Therapy Progress Note  Dates of Reporting Period: 09/08/23 to present  Subjective Statement: Pt has been seen for 8 swallowing therapy sessions.   Objective: Eryck has made excellent success towards   Goal Update: See below. LTG #4 modified to align with this plan new of care.  Plan: Pt should receive follow up MBS to track progress and ID current swallow status. He will require at least one more ST session to review MBS, and hone HEP as necessary  Reason Skilled Services are Required: Pt will need follow up MBS as described above and at least one more ST session to review MBS, and hone HEP as necessary..    ONSET DATE: 2019   REFERRING DIAG:  R13.19 (ICD-10-CM) - Esophageal dysphagia    THERAPY DIAG:  Dysphagia, oropharyngeal phase  Rationale for Evaluation and Treatment: Rehabilitation  SUBJECTIVE:   SUBJECTIVE STATEMENT: "I got off for a bit, now I'm back at it." (Pt had cardiac issues in the last four weeks and was unable to complete HEP daily) Pt accompanied by: self  PERTINENT HISTORY: He is five years out from completion of his chemoradiation therapy. 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 Remote ischemic cardiomyopathy status post bypass surgery, defib placement. Hyoerlipidemia, gout, hypothyroidism. Ankle fx.   PAIN:  Are you having pain? No  FALLS: Has patient fallen in last 6 months?  No  PATIENT GOALS: Improve swallowing status  OBJECTIVE:   DIAGNOSTIC FINDINGS: MBS 08/05/23: Pt demonstrates a moderate oropharyngeal dyspahgia likely related to late effects of radiation induced fibrosis  to muscles of swallowing. Pt observed to have decreased hyoid excursion and epiglottic deflection primarily. Pt has piecemeal swallowing of liquids with good ability to sustain laryngeal elevationa nd vestibular closure with multiple propulsive efforts to push past vallecular space. There is base of tongue retration. and pharyngeal contraction, but they could be stronger to overcome stiffness in pharyngeal structures. Most residue accumulates the in the valleculae; pt uses a liquid rinse with solids and there is trace silent aspiration of thin residue throughout. Pt sensed residue, but not aspiration. Best methods were a chin tuck with solids and liquids which incraesed epigltotic defection. Also intermittent throat clearing. Pt recommended to continue a soft/moist regular diet and thin liquids but with added f/u with OP SLP to initiate a Home exercise program and train chin tuck and throat clearing. Pt also recommended to use strict oral hygiene to reduce bacterial load. Pt at risk of aspiration into the future; mobility is helpful and swallowing therapy may help pt maintain function. Factors that may  increase risk of adverse event in presence of aspiration [name] [employer] [name] 2021):  Frequent aspiration of large volumes;Aspiration of thick, dense, and/or acidic materials   Swallow Evaluation Recommendations Recommendations: PO diet PO Diet Recommendation: Dysphagia 3 (Mechanical soft);Thin liquids (Level 0);Regular Liquid Administration via: Cup;Straw Medication Administration: Crushed with puree Swallowing strategies  : Chin tuck;Clear throat intermittently Postural changes: Position pt fully upright for meals Oral care recommendations: Oral care QID (4x/day)  Esophagram 07/23/23 - No definite mass or stricture is noted in the esophagus. Small sliding-type hiatal hernia is noted. No definite reflux is noted. Barium tablet passed through esophagus and into stomach without significant difficulty or  delay.   CLINICAL SWALLOW ASSESSMENT:   Current diet: regular, Dysphagia 3 (mechanical soft), and thin liquids Objective swallow impairments: see "diagnostic findings" above Objective recommended compensations: see "diagnostic findings" above Dentition: adequate natural dentition Patient directly observed with POs: Yes: dysphagia 3 (soft) and thin liquids  Feeding: able to feed self Liquids provided by: cup Oral phase signs and symptoms:  none noted Pharyngeal phase signs and symptoms: multiple swallows, complaints of residue, and wiped nose with one swallow H2O   PATIENT REPORTED OUTCOME MEASURES (PROM): EAT-10: returned on 09/15/23 with score of 32/40 (higher numbers indicate worse QOL/more challenge with eating).  TODAY'S TREATMENT:                                                                                                                                         DATE: 12/01/23: Pt agrees it would be a good idea for a f/u MBS. Today he completes HEP without cues necessary and ate fig bar and drank water following swallow precautions and without overt s/sx oral or pharyngeal deficits; Pt demo'd silent aspiration during MBS in August however has been without overt s/sx aspiration PNA during this entire plan of care. Pt will require MBS and at least one more session to review MBS, and to hone HEP if necessary.  10/20/23: SLP and pt to discuss if Dion would like to pursue f/u MBS at this time. Pt is not always completing chin tuck with liquids due to what he considers an improvement in swallow function (see "S"). SLP explained silent aspiration and that lack of cough is not a good indicator that he does or does not have aspiration with liquids at this time - that another study is necessary to rule this out. He took the overt s/sx of aspiration PNA again for perusal.  With HEP, pt was independent. Pt req'd initial cue for chin tuck, but was independent with precautions after that. SLP and pt  agreed pt could be seen in 4 weeks and possible d/c then. . 10/08/23: Pt told SLP in those rare times he forgets to use chin tuck it appears that pharyngeal clearance has improved. He continues to perform HEP but suboptimal rep numbers (6-10 reps/day of each). SLP provided some options  for pt to increase rep number/day for each. Appears that Shaker is getting completed as directed.  Today pt ate fig bar and drank water, following precautions, without any overt s/sx oral or pharyngeal deficits. SLP and pt agreed pt could be seen every other week or possibly every 4 weeks after next visit.  09/24/23: Pt followed precautions with self correction x1. No cues necessary from SLP.  He completed HEP with independence. SLP educated pt that retracting and protruding tongue between reps of Masako was allowed. He cont to require drops of water intermittently between swallows.   09/22/23: "I can tell a big difference." Pt reports he is able to eat everything easier than he was prior to initiation of ST. Another example is he had chicken last night and was able to feel it pass through pharynx; Pt stated, "It used to always get hung." With POs, pt followed precautions but SLP suggested pt clear this throat x3-4 more during POs. One instance of "wet" voice which pt cleared with immediate spontaneous throat clear/reswallow. With HEP, pt was modified independent.   09/15/23: Pt reports Shaker is challenging - SLP encouraged pt to cont to perform this and he will ultimately get to 45 seconds. Pt completed HEP today with min A initially for Masako for tongue protrusion, and Mendelsohn for hold at top of thyroid excursion without assistance from hand -pt was independent by session end; Other exercises were completed with independence. Pt was told he will need to cont to perform this HEP for at least 8-10 weeks to maximize effects.  09/14/23: See "s" above: pt having less coughing during meals using safe swallow strategies.  Today with POs, pt used chin tuck spontaneously, needed initial min A to use throat clear voluntarily, but independent after first cue.  SLP educated pt on remainder of HEP - pt req'd model and occasional min A faded to independent by session end.  09/08/23 (eval): Research states the risk for dysphagia increases due to chemoradiation treatment due to a variety of factors, so SLP educated the pt about possible changes to swallowing musculature after rad tx, and why adherence to dysphagia HEP provided today was necessary to treat/minimize the effects of muscle fibrosis following rad tx. SLP informed pt why this would be detrimental to their swallowing status and to their pulmonary health. Pt demonstrated understanding of these things to SLP.  SLP then developed an individualized HEP for pt involving oral and pharyngeal musculature and pt was instructed how to perform these exercises, including SLP demonstration. Effortful swallow was taught to pt today but pt will also be taught Clair Gulling, Masako, tongue press, and Shaker and/or chin tuck against resistance. After SLP demonstration, pt return demonstrated effortful swallow. SLP ensured pt performance was correct prior to educating pt on next exercise. Pt required min cues faded to modified independent to perform effortful swallow today. Pt was instructed to complete this program 6 days/week, 30 reps/day in sets of at least 5-10, for 8-12 weeks, and then x2-3 a week after that, indefinitely   PATIENT EDUCATION: Education details: see "today's treatment" above Person educated: Patient Education method: Explanation, Demonstration, and Verbal cues Education comprehension: verbalized understanding, returned demonstration, verbal cues required, and needs further education   ASSESSMENT:  CLINICAL IMPRESSION: REDERT TODAY. LTG #4 modified for current plan of care. Patient is a 72 y.o. M who was seen today for treatment of swallowing following radiation  therapy in 2019. See above in "today's treatment" for more details. No oral or overt s/sx  pharyngeal deficits were noted today. Pt will cont to need to be seen by SLP in order to assess safety of PO intake, assess the need for recommending any objective swallow assessment, and ensuring pt is correctly completing the individualized HEP. He and SLP agree that after MBS  at least one more session will be necessary prior to d/c.  OBJECTIVE IMPAIRMENTS: include dysphagia. These impairments are limiting patient from safety when swallowing. Factors affecting potential to achieve goals and functional outcome are severity of impairments. Patient will benefit from skilled SLP services to address above impairments and improve overall function.  REHAB POTENTIAL: Good   GOALS: Goals reviewed with patient? Yes  SHORT TERM GOALS: Target date: 10/09/23  Pt will complete HEP with rare min A in 2 sessions Baseline: 09/22/23 Goal status: met  2.  Pt will follow safe swallow strategies from MBS on 08/05/23 with modified independence in 2 sessions Baseline: 09/24/23 Goal status: met  3.  Pt will tell SLP 3 overt s/sx aspiration PNA with modified independence  Baseline:  Goal status: Met   LONG TERM GOALS: Target date: 11/06/23, 01/08/24  Pt will improve PROM from initial administration Baseline:  Goal status: deferred and continue  2.  Pt will complete HEP with modified independence in 2 sessions Baseline: 09/24/23 Goal status: Met  3.  Pt will follow safe swallow strategies from MBS on 08/05/23 in 3 sessions Baseline: 09/24/23, 12/01/23 Goal status: partially met and continue  4.  Pt will undergo follow up MBS by 12/18/23 to track progress and assess for safety of POs Baseline:  Goal status: not met and continue   PLAN:  SLP FREQUENCY: every other week  SLP DURATION: 8 weeks  PLANNED INTERVENTIONS: Aspiration precaution training, Pharyngeal strengthening exercises, Diet toleration management  , SLP instruction and feedback, Compensatory strategies, Patient/family education, and repeat MBS    Geni Skorupski, CCC-SLP 12/01/2023, 8:49 AM

## 2023-12-02 ENCOUNTER — Telehealth: Payer: Self-pay

## 2023-12-02 ENCOUNTER — Other Ambulatory Visit (HOSPITAL_COMMUNITY): Payer: Self-pay | Admitting: Internal Medicine

## 2023-12-02 DIAGNOSIS — R1310 Dysphagia, unspecified: Secondary | ICD-10-CM

## 2023-12-02 DIAGNOSIS — R059 Cough, unspecified: Secondary | ICD-10-CM

## 2023-12-02 NOTE — Telephone Encounter (Signed)
Pt made aware of Dr. Leone Payor recommendations:  Modified Barium Swallow was ordered for pt and sent to schedulers to be scheduled and to notify pt.   Pt stated that they have already called him with a date and time of the appointment on 12/31/2023 at 11:00 AM.  Pt verbalized understanding with all questions answered.

## 2023-12-02 NOTE — Telephone Encounter (Signed)
-----   Message from Stan Head sent at 12/01/2023 12:12 PM EST ----- Regarding: order MBS Matthew Joyce,  I will have my RN Emeline Darling order MBS  Matthew Joyce - Dx is  dysphagia, r/o aspiration   CEG ----- Message ----- From: Nona Dell Sent: 12/01/2023   8:34 AM EST To: Iva Boop, MD  Good morning Dr. Leone PayorChestine Joyce has been with me for approximately 10 weeks and he has done well. I am requesting another MBS be completed to track his progress and hopefully document the absence of aspiration with liquids. If you agree, would you please order? The procedure code is [id number].  Thank you.  Verdie Mosher, MS, SLP  [facility] [phone]

## 2023-12-28 ENCOUNTER — Ambulatory Visit: Payer: Medicare HMO | Attending: Internal Medicine

## 2023-12-28 DIAGNOSIS — R1312 Dysphagia, oropharyngeal phase: Secondary | ICD-10-CM | POA: Diagnosis not present

## 2023-12-28 NOTE — Therapy (Signed)
 OUTPATIENT SPEECH LANGUAGE PATHOLOGY DYSPHAGIA TREATMENT/RECERTIFICATION   Patient Name: Matthew BONN Sr. MRN: [id number] DOB:1951-09-17, 73 y.o., male Today's Date: 12/28/2023  PCP: Amon Schanz, MD  REFERRING PROVIDER: Avram Lupita BRAVO, MD  END OF SESSION:        Past Medical History:  Diagnosis Date   AICD (automatic cardioverter/defibrillator) present 05/2008   St. Jude   Allergy    Arthritis    Bradycardia    Use of beta blocker limited   CAD (coronary artery disease)    a- S/p cabg in march 2006 by Dr.Owen;  b. myoview  5/12: large IL scar from apex to base, no ischemia, EF 37%   Cancer (HCC)    on right side of neck   Clotting disorder [facility])    ED (erectile dysfunction)    Gout    Hyperlipidemia    Ischemic cardiomyopathy    a-Echocardiogram, Nov 2006, showed ejection fraction of 30-40% with mild to moderated mitral  regurgitation and mild aortic regurgitation b- Cardiac MRI, May 2007, EF of 44% with 50% scar involving  the inferolateral walls. No comment of mitral regurgitation. c- last echo  11/10: EF 30-35%  mod MR d- Nega T-Wave alternans testing in May of 2007 e- no ACE due to low BP;  f. CPX 3/11: normal   RBBB (right bundle branch block with left anterior fascicular block)    Systolic CHF, chronic (HCC)    Thyroid  disease    Past Surgical History:  Procedure Laterality Date   CARDIAC DEFIBRILLATOR PLACEMENT  06/01/08   ICD   COLONOSCOPY     COLONOSCOPY WITH PROPOFOL  N/A 07/28/2019   Procedure: COLONOSCOPY WITH PROPOFOL ;  Surgeon: Avram Lupita BRAVO, MD;  Location: [facility] ENDOSCOPY;  Service: Endoscopy;  Laterality: N/A;   CORONARY ARTERY BYPASS GRAFT  02-2005   ICD GENERATOR CHANGEOUT N/A 02/03/2017   Procedure: ICD Generator Changeout;  Surgeon: Danelle LELON Birmingham, MD;  Location: [facility] INVASIVE CV LAB;  Service: Cardiovascular;  Laterality: N/A;   ICD GENERATOR CHANGEOUT N/A 11/09/2023   Procedure: ICD GENERATOR CHANGEOUT;  Surgeon: Birmingham Danelle LELON, MD;  Location: [facility]  INVASIVE CV LAB;  Service: Cardiovascular;  Laterality: N/A;   LEAD REVISION/REPAIR N/A 11/09/2023   Procedure: LEAD REVISION/REPAIR;  Surgeon: Birmingham Danelle LELON, MD;  Location: [facility] INVASIVE CV LAB;  Service: Cardiovascular;  Laterality: N/A;   ORIF ANKLE FRACTURE  1987   left    RIGHT/LEFT HEART CATH AND CORONARY/GRAFT ANGIOGRAPHY N/A 09/20/2019   Procedure: RIGHT/LEFT HEART CATH AND CORONARY/GRAFT ANGIOGRAPHY;  Surgeon: Cherrie Toribio SAUNDERS, MD;  Location: [facility] INVASIVE CV LAB;  Service: Cardiovascular;  Laterality: N/A;   SHOULDER ARTHROSCOPY  02/2010   rt    TEE WITHOUT CARDIOVERSION N/A 10/24/2019   Procedure: TRANSESOPHAGEAL ECHOCARDIOGRAM (TEE);  Surgeon: Cherrie Toribio SAUNDERS, MD;  Location: [facility] ENDOSCOPY;  Service: Cardiovascular;  Laterality: N/A;   TONSILLECTOMY     as a child   Patient Active Problem List   Diagnosis Date Noted   ICD (implantable cardioverter-defibrillator) lead failure 11/09/2023   Presence of implantable cardioverter-defibrillator (ICD) 06/26/2022   VT (ventricular tachycardia) (HCC) 07/02/2021   Paroxysmal atrial fibrillation (HCC) 07/02/2021   Heme + stool    Internal and external prolapsed hemorrhoids    Difficulty swallowing 01/21/2018   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 [facility]) 12/24/2017   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 (HCC) 11/12/2017   PCP NOTES >>>>>>>>>>>>>>>>>>>>> 04/21/2016   Seborrheic dermatitis of  scalp 04/11/2013   Annual physical exam 04/11/2011   MITRAL REGURGITATION 04/10/2010   SYSTOLIC HEART FAILURE, CHRONIC 10/03/2009   Hyperglycemia 06/19/2008   Coronary atherosclerosis 06/14/2008   Automatic implantable cardioverter-defibrillator in situ 06/01/2008   ERECTILE DYSFUNCTION 12/17/2007   Hyperlipidemia 02/06/2007   Gout 02/06/2007   RBBB 02/06/2007   Congestive heart failure (HCC) 02/06/2007   CORONARY ARTERY BYPASS GRAFT, FOUR VESSEL, HX OF  02/12/2005   Speech Therapy Progress Note  Dates of Reporting Period: 09/08/23 to present  Subjective Statement: Pt has been seen for 8 swallowing therapy sessions.   Objective: [place] has made excellent success towards   Goal Update: See below. LTG #4 modified to align with this plan new of care.  Plan: Pt should receive follow up MBS to track progress and ID current swallow status. He will require at least one more ST session to review MBS, and hone HEP as necessary  Reason Skilled Services are Required: Pt will need follow up MBS as described above and at least one more ST session to review MBS, and hone HEP as necessary..    ONSET DATE: 2019   REFERRING DIAG:  R13.19 (ICD-10-CM) - Esophageal dysphagia    THERAPY DIAG:  Dysphagia, oropharyngeal phase  Rationale for Evaluation and Treatment: Rehabilitation  SUBJECTIVE:   SUBJECTIVE STATEMENT: Pt reports doing HEP every day but not the full scope (not doing number of reps) of HEP.   Pt accompanied by: self  PERTINENT HISTORY: He is five years out from completion of his chemoradiation therapy. 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 Remote ischemic cardiomyopathy status post bypass surgery, defib placement. Hyoerlipidemia, gout, hypothyroidism. Ankle fx.   PAIN:  Are you having pain? No  FALLS: Has patient fallen in last 6 months?  No  PATIENT GOALS: Improve swallowing status  OBJECTIVE:   DIAGNOSTIC FINDINGS: MBS 08/05/23: Pt demonstrates a moderate oropharyngeal dyspahgia likely related to late effects of radiation induced fibrosis to muscles of swallowing. Pt observed to have decreased hyoid excursion and epiglottic deflection primarily. Pt has piecemeal swallowing of liquids with good ability to sustain laryngeal elevationa nd vestibular closure with multiple propulsive efforts to push past vallecular space. There is base of  tongue retration. and pharyngeal contraction, but they could be stronger to overcome stiffness in pharyngeal structures. Most residue accumulates the in the valleculae; pt uses a liquid rinse with solids and there is trace silent aspiration of thin residue throughout. Pt sensed residue, but not aspiration. Best methods were a chin tuck with solids and liquids which incraesed epigltotic defection. Also intermittent throat clearing. Pt recommended to continue a soft/moist regular diet and thin liquids but with added f/u with OP SLP to initiate a Home exercise program and train chin tuck and throat clearing. Pt also recommended to use strict oral hygiene to reduce bacterial load. Pt at risk of aspiration into the future; mobility is helpful and swallowing therapy may help pt maintain function. Factors that may increase risk of adverse event in presence of aspiration Noe & Lianne 2021):  Frequent aspiration of large volumes;Aspiration of thick, dense, and/or acidic materials   Swallow Evaluation Recommendations Recommendations: PO diet PO Diet Recommendation: Dysphagia 3 (Mechanical soft);Thin liquids (Level 0);Regular Liquid Administration via: Cup;Straw Medication Administration: Crushed with puree Swallowing strategies  : Chin tuck;Clear throat intermittently Postural changes: Position pt fully upright for meals Oral care recommendations: Oral care QID (4x/day)  Esophagram 07/23/23 -  No definite mass or stricture is noted in the esophagus. Small sliding-type hiatal hernia is noted. No definite reflux is noted. Barium tablet passed through esophagus and into stomach without significant difficulty or delay.   CLINICAL SWALLOW ASSESSMENT:   Current diet: regular, Dysphagia 3 (mechanical soft), and thin liquids Objective swallow impairments: see diagnostic findings above Objective recommended compensations: see diagnostic findings above Dentition: adequate natural dentition Patient directly  observed with POs: Yes: dysphagia 3 (soft) and thin liquids  Feeding: able to feed self Liquids provided by: cup Oral phase signs and symptoms:  none noted Pharyngeal phase signs and symptoms: multiple swallows, complaints of residue, and wiped nose with one swallow H2O   PATIENT REPORTED OUTCOME MEASURES (PROM): EAT-10: returned on 09/15/23 with score of 32/40 (higher numbers indicate worse QOL/more challenge with eating).  TODAY'S TREATMENT:                                                                                                                                         DATE: 12/28/23: Only rarely does pt have to use effortful swallows to have pharyngeal clearance. Not like every bite like before, he stated. Procedure was WNL with each exercise (Shaker was not trialed today). Pt had more difficulty obtaining swallow with Claudette and he stated this was the exercise he had most difficulty maintaining 20 reps/day. SLP encouraged pt to do all exercises including swallowing at least x20/day, at least x5 days a week at least for remainder of month and then doing x20 reps x3 days/week. SLP scheduled pt one more visit next week, after MBS.    12/01/23: Pt agrees it would be a good idea for a f/u MBS. Today he completes HEP without cues necessary and ate fig bar and drank water following swallow precautions and without overt s/sx oral or pharyngeal deficits; Pt demo'd silent aspiration during MBS in August however has been without overt s/sx aspiration PNA during this entire plan of care. Pt will require MBS and at least one more session to review MBS, and to hone HEP if necessary.  10/20/23: SLP and pt to discuss if Casen would like to pursue f/u MBS at this time. Pt is not always completing chin tuck with liquids due to what he considers an improvement in swallow function (see S). SLP explained silent aspiration and that lack of cough is not a good indicator that he does or does not have  aspiration with liquids at this time - that another study is necessary to rule this out. He took the overt s/sx of aspiration PNA again for perusal.  With HEP, pt was independent. Pt req'd initial cue for chin tuck, but was independent with precautions after that. SLP and pt agreed pt could be seen in 4 weeks and possible d/c then. . 10/08/23: Pt told SLP in those rare times he forgets to use chin tuck it appears that pharyngeal  clearance has improved. He continues to perform HEP but suboptimal rep numbers (6-10 reps/day of each). SLP provided some options for pt to increase rep number/day for each. Appears that Shaker is getting completed as directed.  Today pt ate fig bar and drank water, following precautions, without any overt s/sx oral or pharyngeal deficits. SLP and pt agreed pt could be seen every other week or possibly every 4 weeks after next visit.  09/24/23: Pt followed precautions with self correction x1. No cues necessary from SLP.  He completed HEP with independence. SLP educated pt that retracting and protruding tongue between reps of Masako was allowed. He cont to require drops of water intermittently between swallows.   09/22/23: I can tell a big difference. Pt reports he is able to eat everything easier than he was prior to initiation of ST. Another example is he had chicken last night and was able to feel it pass through pharynx; Pt stated, It used to always get hung. With POs, pt followed precautions but SLP suggested pt clear this throat x3-4 more during POs. One instance of wet voice which pt cleared with immediate spontaneous throat clear/reswallow. With HEP, pt was modified independent.   09/15/23: Pt reports Shaker is challenging - SLP encouraged pt to cont to perform this and he will ultimately get to 45 seconds. Pt completed HEP today with min A initially for Masako for tongue protrusion, and Mendelsohn for hold at top of thyroid  excursion without assistance from hand -pt  was independent by session end; Other exercises were completed with independence. Pt was told he will need to cont to perform this HEP for at least 8-10 weeks to maximize effects.  09/14/23: See s above: pt having less coughing during meals using safe swallow strategies. Today with POs, pt used chin tuck spontaneously, needed initial min A to use throat clear voluntarily, but independent after first cue.  SLP educated pt on remainder of HEP - pt req'd model and occasional min A faded to independent by session end.  09/08/23 (eval): Research states the risk for dysphagia increases due to chemoradiation treatment due to a variety of factors, so SLP educated the pt about possible changes to swallowing musculature after rad tx, and why adherence to dysphagia HEP provided today was necessary to treat/minimize the effects of muscle fibrosis following rad tx. SLP informed pt why this would be detrimental to their swallowing status and to their pulmonary health. Pt demonstrated understanding of these things to SLP.  SLP then developed an individualized HEP for pt involving oral and pharyngeal musculature and pt was instructed how to perform these exercises, including SLP demonstration. Effortful swallow was taught to pt today but pt will also be taught Claudette, Masako, tongue press, and Shaker and/or chin tuck against resistance. After SLP demonstration, pt return demonstrated effortful swallow. SLP ensured pt performance was correct prior to educating pt on next exercise. Pt required min cues faded to modified independent to perform effortful swallow today. Pt was instructed to complete this program 6 days/week, 30 reps/day in sets of at least 5-10, for 8-12 weeks, and then x2-3 a week after that, indefinitely   PATIENT EDUCATION: Education details: see today's treatment above Person educated: Patient Education method: Explanation, Demonstration, and Verbal cues Education comprehension: verbalized  understanding and needs further education   ASSESSMENT:  CLINICAL IMPRESSION: Patient is a 73 y.o. M who was seen today for treatment of swallowing following radiation therapy in 2019. See above in today's treatment for more details.  No oral or overt s/sx pharyngeal deficits were noted today. Pt will cont to need to be seen by SLP in order to assess safety of PO intake, assess the need for recommending any objective swallow assessment, and ensuring pt is correctly completing the individualized HEP. He and SLP agree that after MBS  at least one more session will be necessary prior to d/c.  OBJECTIVE IMPAIRMENTS: include dysphagia. These impairments are limiting patient from safety when swallowing. Factors affecting potential to achieve goals and functional outcome are severity of impairments. Patient will benefit from skilled SLP services to address above impairments and improve overall function.  REHAB POTENTIAL: Good   GOALS: Goals reviewed with patient? Yes  SHORT TERM GOALS: Target date: 10/09/23  Pt will complete HEP with rare min A in 2 sessions Baseline: 09/22/23 Goal status: met  2.  Pt will follow safe swallow strategies from MBS on 08/05/23 with modified independence in 2 sessions Baseline: 09/24/23 Goal status: met  3.  Pt will tell SLP 3 overt s/sx aspiration PNA with modified independence  Baseline:  Goal status: Met   LONG TERM GOALS: Target date: 11/06/23, 01/08/24  Pt will improve PROM from initial administration Baseline:  Goal status: continue  2.  Pt will complete HEP with modified independence in 2 sessions Baseline: 09/24/23 Goal status: Met  3.  Pt will follow safe swallow strategies from MBS on 08/05/23 in 3 sessions Baseline: 09/24/23, 12/01/23 Goal status: met  4.  Pt will undergo follow up MBS by 12/18/23 to track progress and assess for safety of POs Baseline:  Goal status: continue   PLAN:  SLP FREQUENCY: every other week  SLP DURATION: 8  weeks  PLANNED INTERVENTIONS: Aspiration precaution training, Pharyngeal strengthening exercises, Diet toleration management , SLP instruction and feedback, Compensatory strategies, Patient/family education, and repeat MBS    Dyami Umbach, CCC-SLP 12/28/2023, 8:24 AM

## 2023-12-29 DIAGNOSIS — H524 Presbyopia: Secondary | ICD-10-CM | POA: Diagnosis not present

## 2023-12-29 DIAGNOSIS — Z0100 Encounter for examination of eyes and vision without abnormal findings: Secondary | ICD-10-CM | POA: Diagnosis not present

## 2023-12-30 ENCOUNTER — Other Ambulatory Visit: Payer: Self-pay | Admitting: Internal Medicine

## 2023-12-31 ENCOUNTER — Ambulatory Visit (HOSPITAL_COMMUNITY)
Admission: RE | Admit: 2023-12-31 | Discharge: 2023-12-31 | Disposition: A | Discharge status: Home / Self Care | Payer: Medicare HMO | Source: Ambulatory Visit | Attending: Internal Medicine | Admitting: Internal Medicine

## 2023-12-31 ENCOUNTER — Ambulatory Visit (HOSPITAL_COMMUNITY)
Admission: RE | Admit: 2023-12-31 | Discharge: 2023-12-31 | Disposition: A | Discharge status: Home / Self Care | Payer: Medicare HMO | Source: Ambulatory Visit | Attending: *Deleted | Admitting: *Deleted

## 2023-12-31 DIAGNOSIS — K117 Disturbances of salivary secretion: Secondary | ICD-10-CM | POA: Insufficient documentation

## 2023-12-31 DIAGNOSIS — R1310 Dysphagia, unspecified: Secondary | ICD-10-CM

## 2023-12-31 DIAGNOSIS — Z923 Personal history of irradiation: Secondary | ICD-10-CM | POA: Insufficient documentation

## 2023-12-31 DIAGNOSIS — R059 Cough, unspecified: Secondary | ICD-10-CM | POA: Insufficient documentation

## 2023-12-31 DIAGNOSIS — E039 Hypothyroidism, unspecified: Secondary | ICD-10-CM | POA: Diagnosis not present

## 2023-12-31 DIAGNOSIS — I255 Ischemic cardiomyopathy: Secondary | ICD-10-CM | POA: Diagnosis not present

## 2023-12-31 DIAGNOSIS — Z951 Presence of aortocoronary bypass graft: Secondary | ICD-10-CM | POA: Diagnosis not present

## 2023-12-31 DIAGNOSIS — R1319 Other dysphagia: Secondary | ICD-10-CM

## 2023-12-31 DIAGNOSIS — Z95810 Presence of automatic (implantable) cardiac defibrillator: Secondary | ICD-10-CM | POA: Insufficient documentation

## 2023-12-31 DIAGNOSIS — R590 Localized enlarged lymph nodes: Secondary | ICD-10-CM | POA: Insufficient documentation

## 2023-12-31 DIAGNOSIS — R1312 Dysphagia, oropharyngeal phase: Secondary | ICD-10-CM | POA: Diagnosis not present

## 2023-12-31 DIAGNOSIS — I2510 Atherosclerotic heart disease of native coronary artery without angina pectoris: Secondary | ICD-10-CM | POA: Insufficient documentation

## 2023-12-31 DIAGNOSIS — M109 Gout, unspecified: Secondary | ICD-10-CM | POA: Insufficient documentation

## 2023-12-31 DIAGNOSIS — E785 Hyperlipidemia, unspecified: Secondary | ICD-10-CM | POA: Diagnosis not present

## 2023-12-31 NOTE — Progress Notes (Addendum)
Modified Barium Swallow Study  Patient Details  Name: Matthew Joyce. MRN: [id number] Date of Birth: 04/13/1951  Today's Date: 12/31/2023  Modified Barium Swallow completed.  Full report located under Chart Review in the Imaging Section.  History of Present Illness  73 yo M five years out from completion of his chemoradiation therapy. 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 Remote ischemic cardiomyopathy status post bypass surgery, defib placement. Hyoerlipidemia, gout, hypothyroidism. Currently followed by OP SLP for direction of dysphagia HEP. Endorses subjective imprvovement in swallow function c/b reduced need for piecemeal swallowing or multiple swallows per bolus for clearance. Some days better than others per pt.   Clinical Impression Pt demonstrates ongoing oropharyngeal dysphagia, likely 2/2 remote hx chemoradiation for HNC. Trials consisted of thin liquids, mildly thick liquids, puree, hard solid, pill with liquid. Oral phase impairments noted for oral holding and lingual pumping, appears to have decreased tongue strength & amplitude of movement, which results in oral residue on tongue s/p initiation of pharyngeal swallow and piecemeal deglutition. Pharyngeal deficits include reduced laryngeal elevation, reduced anterior hyoid excursion, epiglottic inversion, tongue base retraction, UES opening.  Impairment in laryngeal elevation results in insufficient airway protection at onset of swallow, allowing for early aspiration. Increased closure/protection noted as swallow progresses, with laryngeal vestibule closure eventually achieved. With typical swallows, epiglottic attempts deflection but does not achieve full inversion, contributing to vallecula residue. UES distension is insufficient for complete passage of bolus into esophagus, resulting in residue in pyriform sinuses. Silent aspiration (PAS 8)  noted with thin liquids throughout exam. Cued coughs are insufficient for ejecting material from airway. Penetration seen with nectar thick liquids and puree textures. Chin tuck posture appears to reduce aspiration, though penetration still observed, likely 2/2 increased epiglottic inversion and earlier airway closure.  Pt had pharyngeal residue across trials/consistencies with liquid residue decreasing with spontaneous re-swallows, though full pharyngeal clearance not achieved. Majority of bolus remained in pharynx with puree and solid trials with need for liquid wash to aid in clearance. With pill administration, pill initially stops in valleculae, with independent utilization of multiple liquid washes to move into pyriform sinuses. Subsequent statis in pyriform sinus with another liquid wash to move into cervical esophagus. Another liquid wash needed for eventual clearance. Esophageal sweep unremarkable with full clearance noted. SLP provided education on results from MBSS. Strong recommendation made for increased oral care, remaining active, managing overall health for decreased risk of sequelae related to aspiration. Encouraged ongoing participation in robust HEP to aid in improvement or maintenance of current function in setting of HNC chemoradiation. Factors that may increase risk of adverse event in presence of aspiration [name] & [name] 2021): Reduced saliva;Frequent aspiration of large volumes  Swallow Evaluation Recommendations Recommendations: PO diet PO Diet Recommendation: Regular;Thin liquids (Level 0) Medication Administration: Whole meds with liquid Swallowing strategies  : Small bites/sips;Multiple dry swallows after each bite/sip;Follow solids with liquids;Hard cough after swallowing Postural changes: Position pt fully upright for meals Oral care recommendations: Oral care QID (4x/day)      Maia Breslow 12/31/2023,3:01 PM

## 2024-01-06 ENCOUNTER — Ambulatory Visit: Payer: Medicare HMO

## 2024-01-06 DIAGNOSIS — R1312 Dysphagia, oropharyngeal phase: Secondary | ICD-10-CM

## 2024-01-06 NOTE — Therapy (Signed)
OUTPATIENT SPEECH LANGUAGE PATHOLOGY DYSPHAGIA TREATMENT/Discharge summary   Patient Name: Matthew HAYENGA Sr. MRN: [id number] DOB:August 05, 1951, 73 y.o., male 3 Date: 01/06/2024  PCP: Willow Ora, MD  REFERRING PROVIDER: Iva Boop, MD  END OF SESSION:  End of Session - 01/06/24 0947     Visit Number 10    Number of Visits 17    Date for SLP Re-Evaluation 01/08/24    SLP Start Time 0833    SLP Stop Time  0913    SLP Time Calculation (min) 40 min    Activity Tolerance Patient tolerated treatment well                  Past Medical History:  Diagnosis Date   AICD (automatic cardioverter/defibrillator) present 05/2008   [employer] Jude   Allergy    Arthritis    Bradycardia    Use of beta blocker limited   CAD (coronary artery disease)    a- S/p cabg in march 2006 by Dr.Owen;  b. myoview 5/12: large IL scar from apex to base, no ischemia, EF 37%   Cancer (HCC)    on right side of neck   Clotting disorder [facility])    ED (erectile dysfunction)    Gout    Hyperlipidemia    Ischemic cardiomyopathy    a-Echocardiogram, Nov 2006, showed ejection fraction of 30-40% with mild to moderated mitral  regurgitation and mild aortic regurgitation b- Cardiac MRI, May 2007, EF of 44% with 50% scar involving  the inferolateral walls. No comment of mitral regurgitation. c- last echo  11/10: EF 30-35%  mod MR d- Nega T-Wave alternans testing in May of 2007 e- no ACE due to low BP;  f. CPX 3/11: normal   RBBB (right bundle branch block with left anterior fascicular block)    Systolic CHF, chronic (HCC)    Thyroid disease    Past Surgical History:  Procedure Laterality Date   CARDIAC DEFIBRILLATOR PLACEMENT  06/01/08   ICD   COLONOSCOPY     COLONOSCOPY WITH PROPOFOL N/A 07/28/2019   Procedure: COLONOSCOPY WITH PROPOFOL;  Surgeon: Iva Boop, MD;  Location: [facility] ENDOSCOPY;  Service: Endoscopy;  Laterality: N/A;   CORONARY ARTERY BYPASS GRAFT  02-2005   ICD GENERATOR CHANGEOUT N/A  02/03/2017   Procedure: ICD Generator Changeout;  Surgeon: Marinus Maw, MD;  Location: [facility] INVASIVE CV LAB;  Service: Cardiovascular;  Laterality: N/A;   ICD GENERATOR CHANGEOUT N/A 11/09/2023   Procedure: ICD GENERATOR CHANGEOUT;  Surgeon: Marinus Maw, MD;  Location: [facility] INVASIVE CV LAB;  Service: Cardiovascular;  Laterality: N/A;   LEAD REVISION/REPAIR N/A 11/09/2023   Procedure: LEAD REVISION/REPAIR;  Surgeon: Marinus Maw, MD;  Location: [facility] INVASIVE CV LAB;  Service: Cardiovascular;  Laterality: N/A;   ORIF ANKLE FRACTURE  1987   left    RIGHT/LEFT HEART CATH AND CORONARY/GRAFT ANGIOGRAPHY N/A 09/20/2019   Procedure: RIGHT/LEFT HEART CATH AND CORONARY/GRAFT ANGIOGRAPHY;  Surgeon: Dolores Patty, MD;  Location: [facility] INVASIVE CV LAB;  Service: Cardiovascular;  Laterality: N/A;   SHOULDER ARTHROSCOPY  02/2010   rt    TEE WITHOUT CARDIOVERSION N/A 10/24/2019   Procedure: TRANSESOPHAGEAL ECHOCARDIOGRAM (TEE);  Surgeon: Dolores Patty, MD;  Location: [facility] ENDOSCOPY;  Service: Cardiovascular;  Laterality: N/A;   TONSILLECTOMY     as a child   Patient Active Problem List   Diagnosis Date Noted   ICD (implantable cardioverter-defibrillator) lead failure 11/09/2023   Presence of implantable cardioverter-defibrillator (ICD) 06/26/2022  VT (ventricular tachycardia) (HCC) 07/02/2021   Paroxysmal atrial fibrillation (HCC) 07/02/2021   Heme + stool    Internal and external prolapsed hemorrhoids    Difficulty swallowing 01/21/2018   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 (HCC) 12/24/2017   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 [facility]) 11/12/2017   PCP NOTES >>>>>>>>>>>>>>>>>>>>> 04/21/2016   Seborrheic dermatitis of scalp 04/11/2013   Annual physical exam 04/11/2011   MITRAL REGURGITATION 04/10/2010   SYSTOLIC HEART FAILURE, CHRONIC 10/03/2009   Hyperglycemia 06/19/2008   Coronary atherosclerosis 06/14/2008    Automatic implantable cardioverter-defibrillator in situ 06/01/2008   ERECTILE DYSFUNCTION 12/17/2007   Hyperlipidemia 02/06/2007   Gout 02/06/2007   RBBB 02/06/2007   Congestive heart failure (HCC) 02/06/2007   CORONARY ARTERY BYPASS GRAFT, FOUR VESSEL, HX OF 02/12/2005   SPEECH THERAPY DISCHARGE SUMMARY  Visits from Start of Care: 10  Current functional level related to goals / functional outcomes: See below. Pt swallow function has improved during therapy course however pt still exhibits silent aspiration with thin liquids. Pt will cont with HEP with incr'd reps/day for another 13 weeks (6 days/week) and will then decr frequency to x3/week.    Remaining deficits: Oropharyngeal dysphagia   Education / Equipment: See therapy notes. HEP, swallowing precautions, rationale for swallowing precautions   Patient agrees to discharge. Patient goals were met. Patient is being discharged due to being pleased with the current functional level..      ONSET DATE: 2019   REFERRING DIAG:  R13.19 (ICD-10-CM) - Esophageal dysphagia    THERAPY DIAG:  Dysphagia, oropharyngeal phase  Rationale for Evaluation and Treatment: Rehabilitation  SUBJECTIVE:   SUBJECTIVE STATEMENT: "She said I did a little better."   Pt accompanied by: self  PERTINENT HISTORY: He is five years out from completion of his chemoradiation therapy. 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 Remote ischemic cardiomyopathy status post bypass surgery, defib placement. Hyoerlipidemia, gout, hypothyroidism. Ankle fx.   PAIN:  Are you having pain? No  FALLS: Has patient fallen in last 6 months?  No  PATIENT GOALS: Improve swallowing status  OBJECTIVE:   DIAGNOSTIC FINDINGS: MBS 12/31/23: Clinical Impression: Pt demonstrates ongoing oropharyngeal dysphagia, likely 2/2 remote hx chemoradiation for HNC. Trials consisted of  thin liquids, mildly thick liquids, puree, hard solid, pill with liquid. Oral phase impairments noted for oral holding and lingual pumping, appears to have decreased tongue strength & amplitude of movement, which results in oral residue on tongue s/p initiation of pharyngeal swallow and piecemeal deglutition. Pharyngeal deficits include reduced laryngeal elevation, reduced anterior hyoid excursion, epiglottic inversion, tongue base retraction, UES opening.  Impairment in laryngeal elevation results in insufficient airway protection at onset of swallow, allowing for early aspiration. Increased closure/protection noted as swallow progresses, with laryngeal vestibule closure eventually achieved. With typical swallows, epiglottic attempts deflection but does not achieve full inversion, contributing to vallecula residue. UES distension is insufficient for complete passage of bolus into esophagus, resulting in residue in pyriform sinuses. Silent aspiration (PAS 8) noted with thin liquids throughout exam. Cued coughs are insufficient for ejecting material from airway. Penetration seen with nectar thick liquids and puree textures. Chin tuck posture appears to reduce aspiration, though penetration still observed, likely 2/2 increased epiglottic inversion and earlier airway closure.  Pt had pharyngeal residue across trials/consistencies with liquid residue decreasing with spontaneous re-swallows, though full pharyngeal clearance not  achieved. Majority of bolus remained in pharynx with puree and solid trials with need for liquid wash to aid in clearance. With pill administration, pill initially stops in valleculae, with independent utilization of multiple liquid washes to move into pyriform sinuses. Subsequent statis in pyriform sinus with another liquid wash to move into cervical esophagus. Another liquid wash needed for eventual clearance. Esophageal sweep unremarkable with full clearance noted. SLP provided education on  results from MBSS. Strong recommendation made for increased oral care, remaining active, managing overall health for decreased risk of sequelae related to aspiration. Encouraged ongoing participation in robust HEP to aid in improvement or maintenance of current function in setting of HNC chemoradiation.   Factors that may increase risk of adverse event in presence of aspiration [name] & [name] 2021): Factors that may increase risk of adverse event in presence of aspiration [name] & [name] 2021): Reduced saliva; Frequent aspiration of large volumes   Recommendations/Plan: Swallowing Evaluation Recommendations Swallowing Evaluation Recommendations Recommendations: PO diet PO Diet Recommendation: Regular; Thin liquids (Level 0) Medication Administration: Whole meds with liquid Swallowing strategies  : Small bites/sips; Multiple dry swallows after each bite/sip; Follow solids with liquids; Hard cough after swallowing Postural changes: Position pt fully upright for meals Oral care recommendations: Oral care QID (4x/day)  MBS 08/05/23: Pt demonstrates a moderate oropharyngeal dyspahgia likely related to late effects of radiation induced fibrosis to muscles of swallowing. Pt observed to have decreased hyoid excursion and epiglottic deflection primarily. Pt has piecemeal swallowing of liquids with good ability to sustain laryngeal elevationa nd vestibular closure with multiple propulsive efforts to push past vallecular space. There is base of tongue retration. and pharyngeal contraction, but they could be stronger to overcome stiffness in pharyngeal structures. Most residue accumulates the in the valleculae; pt uses a liquid rinse with solids and there is trace silent aspiration of thin residue throughout. Pt sensed residue, but not aspiration. Best methods were a chin tuck with solids and liquids which incraesed epigltotic defection. Also intermittent throat clearing. Pt recommended to continue a  soft/moist regular diet and thin liquids but with added f/u with OP SLP to initiate a Home exercise program and train chin tuck and throat clearing. Pt also recommended to use strict oral hygiene to reduce bacterial load. Pt at risk of aspiration into the future; mobility is helpful and swallowing therapy may help pt maintain function. Factors that may increase risk of adverse event in presence of aspiration [name] & [name] 2021):  Frequent aspiration of large volumes;Aspiration of thick, dense, and/or acidic materials   Swallow Evaluation Recommendations Recommendations: PO diet PO Diet Recommendation: Dysphagia 3 (Mechanical soft);Thin liquids (Level 0);Regular Liquid Administration via: Cup;Straw Medication Administration: Crushed with puree Swallowing strategies  : Chin tuck;Clear throat intermittently Postural changes: Position pt fully upright for meals Oral care recommendations: Oral care QID (4x/day)  Esophagram 07/23/23 - No definite mass or stricture is noted in the esophagus. Small sliding-type hiatal hernia is noted. No definite reflux is noted. Barium tablet passed through esophagus and into stomach without significant difficulty or delay.    PATIENT REPORTED OUTCOME MEASURES (PROM): EAT-10: returned on 09/15/23 with score of 32/40 (higher numbers indicate worse QOL/more challenge with eating).  TODAY'S TREATMENT:  DATE: 01/06/24: Pt with MBS on 12/31/23, results above. SLP reviewed results with pt and reiterated pt must complete the totality of the HEP. Reviewed HEP with him and he req'd min A faded to independent with Mendelsohn (hold without tactile cues), and Masako (tongue protruded adequately). Pt stated in the last 4-6 weeks he has had more difficulty with completing HEP due to cardiac issues and lack of motivation. SLP told pt to complete  effortful, tongue press, Masako, Mendelsohn at LEAST 40 reps/day, keep Shaker at 6 reps/day. Educated pt about how to make HEP more difficult (increase hold times and/or increase reps up to 50/day). SLP told pt to cont with HEP at least 6 days/week until 04/14/24 at which time he can decr to x3 days/week. Reviewed swallow precautions with pt from [facility] 12/31/23. Pt req'd rare min A for multiple swallows fading to independent. Told pt if he wanted to have NTL he did not need to use chin tuck, but still needed all other precautions.  He was comfortable with d/c today; He is pleased with progress and stated he can cont HEP and precautions on his own. PROM was completed and he scored 12/40, an improvement from original administration (32/40).  12/28/23: "Only rarely" does pt have to use effortful swallows to have pharyngeal clearance. "Not like every bite like before," he stated. Procedure was WNL with each exercise (Shaker was not trialed today). Pt had more difficulty obtaining swallow with Clair Gulling and he stated this was the exercise he had most difficulty maintaining 20 reps/day. SLP encouraged pt to do all exercises including swallowing at least x20/day, at least x5 days a week at least for remainder of month and then doing x20 reps x3 days/week. SLP scheduled pt one more visit next week, after MBS.    12/01/23: Pt agrees it would be a good idea for a f/u MBS. Today he completes HEP without cues necessary and ate fig bar and drank water following swallow precautions and without overt s/sx oral or pharyngeal deficits; Pt demo'd silent aspiration during MBS in August however has been without overt s/sx aspiration PNA during this entire plan of care. Pt will require MBS and at least one more session to review MBS, and to hone HEP if necessary.  10/20/23: SLP and pt to discuss if Timathy would like to pursue f/u MBS at this time. Pt is not always completing chin tuck with liquids due to what he considers an  improvement in swallow function (see "S"). SLP explained silent aspiration and that lack of cough is not a good indicator that he does or does not have aspiration with liquids at this time - that another study is necessary to rule this out. He took the overt s/sx of aspiration PNA again for perusal.  With HEP, pt was independent. Pt req'd initial cue for chin tuck, but was independent with precautions after that. SLP and pt agreed pt could be seen in 4 weeks and possible d/c then. . 10/08/23: Pt told SLP in those rare times he forgets to use chin tuck it appears that pharyngeal clearance has improved. He continues to perform HEP but suboptimal rep numbers (6-10 reps/day of each). SLP provided some options for pt to increase rep number/day for each. Appears that Shaker is getting completed as directed.  Today pt ate fig bar and drank water, following precautions, without any overt s/sx oral or pharyngeal deficits. SLP and pt agreed pt could be seen every other week or possibly every 4 weeks after  next visit.  09/24/23: Pt followed precautions with self correction x1. No cues necessary from SLP.  He completed HEP with independence. SLP educated pt that retracting and protruding tongue between reps of Masako was allowed. He cont to require drops of water intermittently between swallows.   09/22/23: "I can tell a big difference." Pt reports he is able to eat everything easier than he was prior to initiation of ST. Another example is he had chicken last night and was able to feel it pass through pharynx; Pt stated, "It used to always get hung." With POs, pt followed precautions but SLP suggested pt clear this throat x3-4 more during POs. One instance of "wet" voice which pt cleared with immediate spontaneous throat clear/reswallow. With HEP, pt was modified independent.   09/15/23: Pt reports Shaker is challenging - SLP encouraged pt to cont to perform this and he will ultimately get to 45 seconds. Pt completed  HEP today with min A initially for Masako for tongue protrusion, and Mendelsohn for hold at top of thyroid excursion without assistance from hand -pt was independent by session end; Other exercises were completed with independence. Pt was told he will need to cont to perform this HEP for at least 8-10 weeks to maximize effects.  09/14/23: See "s" above: pt having less coughing during meals using safe swallow strategies. Today with POs, pt used chin tuck spontaneously, needed initial min A to use throat clear voluntarily, but independent after first cue.  SLP educated pt on remainder of HEP - pt req'd model and occasional min A faded to independent by session end.  09/08/23 (eval): Research states the risk for dysphagia increases due to chemoradiation treatment due to a variety of factors, so SLP educated the pt about possible changes to swallowing musculature after rad tx, and why adherence to dysphagia HEP provided today was necessary to treat/minimize the effects of muscle fibrosis following rad tx. SLP informed pt why this would be detrimental to their swallowing status and to their pulmonary health. Pt demonstrated understanding of these things to SLP.  SLP then developed an individualized HEP for pt involving oral and pharyngeal musculature and pt was instructed how to perform these exercises, including SLP demonstration. Effortful swallow was taught to pt today but pt will also be taught Clair Gulling, Masako, tongue press, and Shaker and/or chin tuck against resistance. After SLP demonstration, pt return demonstrated effortful swallow. SLP ensured pt performance was correct prior to educating pt on next exercise. Pt required min cues faded to modified independent to perform effortful swallow today. Pt was instructed to complete this program 6 days/week, 30 reps/day in sets of at least 5-10, for 8-12 weeks, and then x2-3 a week after that, indefinitely   PATIENT EDUCATION: Education details: see "today's  treatment" above Person educated: Patient Education method: Explanation, Demonstration, and Verbal cues Education comprehension: verbalized understanding and needs further education   ASSESSMENT:  CLINICAL IMPRESSION: Patient is a 73 y.o. M who was seen today for treatment of swallowing following radiation therapy in 2019. MBS completed on 12/31/23. See above in "today's treatment" for more details. No oral or overt s/sx pharyngeal deficits were noted today. He is comfortable with d/c today and will cont HEP 5 days/week with at least 40 reps/day (except Shaker) until 04/14/24 at which time he will decr to x3 days/week.  OBJECTIVE IMPAIRMENTS: include dysphagia. These impairments are limiting patient from safety when swallowing. Factors affecting potential to achieve goals and functional outcome are severity of impairments. Patient  will benefit from skilled SLP services to address above impairments and improve overall function.  REHAB POTENTIAL: Good   GOALS: Goals reviewed with patient? Yes  SHORT TERM GOALS: Target date: 10/09/23  Pt will complete HEP with rare min A in 2 sessions Baseline: 09/22/23 Goal status: met  2.  Pt will follow safe swallow strategies from MBS on 08/05/23 with modified independence in 2 sessions Baseline: 09/24/23 Goal status: met  3.  Pt will tell SLP 3 overt s/sx aspiration PNA with modified independence  Baseline:  Goal status: Met   LONG TERM GOALS: Target date: 11/06/23, 01/08/24  Pt will improve PROM from initial administration Baseline:  Goal status: Met  2.  Pt will complete HEP with modified independence in 2 sessions Baseline: 09/24/23 Goal status: Met  3.  Pt will follow safe swallow strategies from MBS on 08/05/23 in 3 sessions Baseline: 09/24/23, 12/01/23 Goal status: met  4.  Pt will undergo follow up MBS by 12/18/23 to track progress and assess for safety of POs Baseline:  Goal status: Met   PLAN:  Discharge today  PLANNED  INTERVENTIONS: Aspiration precaution training, Pharyngeal strengthening exercises, Diet toleration management , SLP instruction and feedback, Compensatory strategies, Patient/family education, and repeat MBS    Tysin Salada, CCC-SLP 01/06/2024, 9:48 AM

## 2024-01-06 NOTE — Patient Instructions (Signed)
  Small bites/sips;  Multiple dry swallows after each bite/sip;  Follow solids with liquids;  Hard cough after swallowing

## 2024-01-26 ENCOUNTER — Ambulatory Visit: Payer: Medicare HMO

## 2024-01-28 DIAGNOSIS — C801 Malignant (primary) neoplasm, unspecified: Secondary | ICD-10-CM | POA: Diagnosis not present

## 2024-01-28 DIAGNOSIS — C770 Secondary and unspecified malignant neoplasm of lymph nodes of head, face and neck: Secondary | ICD-10-CM | POA: Diagnosis not present

## 2024-01-28 DIAGNOSIS — Z923 Personal history of irradiation: Secondary | ICD-10-CM | POA: Diagnosis not present

## 2024-02-09 ENCOUNTER — Ambulatory Visit (INDEPENDENT_AMBULATORY_CARE_PROVIDER_SITE_OTHER): Payer: Medicare HMO

## 2024-02-09 DIAGNOSIS — I255 Ischemic cardiomyopathy: Secondary | ICD-10-CM

## 2024-02-10 LAB — CUP PACEART REMOTE DEVICE CHECK
Battery Remaining Longevity: 117 mo
Battery Remaining Percentage: 93 %
Battery Voltage: 3.04 V
Brady Statistic RV Percent Paced: 1 %
Date Time Interrogation Session: 20250225035500
HighPow Impedance: 62 Ohm
Implantable Lead Connection Status: 753985
Implantable Lead Implant Date: 20241125
Implantable Lead Location: 753860
Implantable Pulse Generator Implant Date: 20241125
Lead Channel Impedance Value: 500 Ohm
Lead Channel Pacing Threshold Amplitude: 1 V
Lead Channel Pacing Threshold Pulse Width: 0.5 ms
Lead Channel Sensing Intrinsic Amplitude: 12 mV
Lead Channel Setting Pacing Amplitude: 2.5 V
Lead Channel Setting Pacing Pulse Width: 0.5 ms
Lead Channel Setting Sensing Sensitivity: 0.5 mV
Pulse Gen Serial Number: 211016570

## 2024-02-14 ENCOUNTER — Encounter: Payer: Self-pay | Admitting: Internal Medicine

## 2024-02-23 ENCOUNTER — Other Ambulatory Visit: Payer: Self-pay | Admitting: Internal Medicine

## 2024-02-23 ENCOUNTER — Ambulatory Visit: Payer: Medicare HMO | Attending: Internal Medicine | Admitting: Internal Medicine

## 2024-02-23 ENCOUNTER — Encounter: Payer: Self-pay | Admitting: Internal Medicine

## 2024-02-23 VITALS — BP 92/52 | HR 54 | Ht 69.0 in | Wt 148.2 lb

## 2024-02-23 DIAGNOSIS — I472 Ventricular tachycardia, unspecified: Secondary | ICD-10-CM | POA: Diagnosis not present

## 2024-02-23 DIAGNOSIS — I5022 Chronic systolic (congestive) heart failure: Secondary | ICD-10-CM

## 2024-02-23 LAB — CUP PACEART INCLINIC DEVICE CHECK
Battery Remaining Longevity: 120 mo
Brady Statistic RV Percent Paced: 0.26 %
Date Time Interrogation Session: 20250311154043
HighPow Impedance: 63 Ohm
Implantable Lead Connection Status: 753985
Implantable Lead Implant Date: 20241125
Implantable Lead Location: 753860
Implantable Pulse Generator Implant Date: 20241125
Lead Channel Impedance Value: 500 Ohm
Lead Channel Pacing Threshold Amplitude: 1 V
Lead Channel Pacing Threshold Amplitude: 1 V
Lead Channel Pacing Threshold Pulse Width: 0.5 ms
Lead Channel Pacing Threshold Pulse Width: 0.5 ms
Lead Channel Sensing Intrinsic Amplitude: 12 mV
Lead Channel Setting Pacing Amplitude: 2.5 V
Lead Channel Setting Pacing Pulse Width: 0.5 ms
Lead Channel Setting Sensing Sensitivity: 0.5 mV
Pulse Gen Serial Number: 211016570

## 2024-02-23 NOTE — Progress Notes (Signed)
 HPI Matthew Joyce today for ongoing evaluation of ventricular tachycardia.  He is a very pleasant 73 year old man with an ischemic cardiomyopathy status post bypass surgery over 18 years ago.  His ejection fraction has been between 30 and 35%.  He is status post ICD implantation.  He has a history of sinus node dysfunction and right bundle branch block.  He has been intolerant to beta-blockers in the past.  The patient has had recurrent episodes of ventricular tachycardia and has been placed on Pacerone as well as mexiletine.  He feels better with no recurrent arrhythmias recently and no problems tolerating his medications.  He has developed bradycardia but is asymptomatic.  He is able to play golf and walk.  He has class II heart failure symptoms.  He received an ICD shock due to noise artifact. His pacing impedence had acutely risen and he underwent insertion of a new ICD lead at the time of device change out.  Allergies  Allergen Reactions   Atorvastatin      myalgia, Muscle aching   Entresto [Sacubitril-Valsartan] Itching    Had itching and redness   Sulfonamide Derivatives     Rash and hot flashes   Sulfamethoxazole Rash and Other (See Comments)    Hot flashes   Tape Other (See Comments)    Rips skin- use PAPER tape     Current Outpatient Medications  Medication Sig Dispense Refill   acetaminophen (TYLENOL) 500 MG tablet Take 500-1,000 mg by mouth every 6 (six) hours as needed (for pain.).     amiodarone (PACERONE) 200 MG tablet Take 1 tablet (200 mg total) by mouth daily. 90 tablet 3   candesartan (ATACAND) 8 MG tablet TAKE 1 TABLET (8 MG TOTAL) BY MOUTH DAILY. NEEDS FOLLOW UP APPOINTMENT FOR MORE REFILLS 90 tablet 0   carboxymethylcellul-glycerin (OPTIVE) 0.5-0.9 % ophthalmic solution Place 1 drop into both eyes daily. Refresh     cetirizine (ZYRTEC) 10 MG tablet Take 10 mg by mouth daily as needed for allergies.     Colchicine (MITIGARE) 0.6 MG CAPS Take 0.6 mg by mouth 2  (two) times daily as needed (gout flare). 60 capsule 0   dapagliflozin propanediol (FARXIGA) 10 MG TABS tablet Take 1 tablet (10 mg total) by mouth daily. (Take BEFORE BREAKFAST) 90 tablet 3   ELIQUIS 5 MG TABS tablet TAKE 1 TABLET BY MOUTH TWICE A DAY 180 tablet 1   furosemide (LASIX) 40 MG tablet TAKE 1 TABLET BY MOUTH EVERY DAY (Patient taking differently: Take 40 mg by mouth every other day.) 90 tablet 3   levothyroxine (SYNTHROID) 88 MCG tablet Take 1 tablet (88 mcg total) by mouth daily before breakfast. 90 tablet 1   mexiletine (MEXITIL) 250 MG capsule Take 1 capsule (250 mg total) by mouth 2 (two) times daily. NEEDS FOLLOW UP APPOINTMENT FOR MORE REFILLS 180 capsule 0   nitroGLYCERIN (NITROSTAT) 0.4 MG SL tablet Place 1 tablet (0.4 mg total) under the tongue every 5 (five) minutes x 3 doses as needed for chest pain. 25 tablet 3   Omega-3 Fatty Acids (FISH OIL) 1200 MG CAPS Take 3,600 mg by mouth daily.     rosuvastatin (CRESTOR) 40 MG tablet Take 1 tablet (40 mg total) by mouth daily. 90 tablet 1   spironolactone (ALDACTONE) 25 MG tablet TAKE 1/2 TABLET BY MOUTH EVERY DAY 45 tablet 3   No current facility-administered medications for this visit.     Past Medical History:  Diagnosis Date  AICD (automatic cardioverter/defibrillator) present 05/2008   St. Jude   Allergy    Arthritis    Bradycardia    Use of beta blocker limited   CAD (coronary artery disease)    a- S/p cabg in march 2006 by Dr.Owen;  b. Celine Ahr 5/12: large IL scar from apex to base, no ischemia, EF 37%   Cancer (HCC)    on right side of neck   Clotting disorder [facility])    ED (erectile dysfunction)    Gout    Hyperlipidemia    Ischemic cardiomyopathy    a-Echocardiogram, Nov 2006, showed ejection fraction of 30-40% with mild to moderated mitral  regurgitation and mild aortic regurgitation b- Cardiac MRI, May 2007, EF of 44% with 50% scar involving  the inferolateral walls. No comment of mitral regurgitation. c-  last echo  11/10: EF 30-35%  mod MR d- Nega T-Wave alternans testing in May of 2007 e- no ACE due to low BP;  f. CPX 3/11: normal   RBBB (right bundle branch block with left anterior fascicular block)    Systolic CHF, chronic (HCC)    Thyroid disease     ROS:   All systems reviewed and negative except as noted in the HPI.   Past Surgical History:  Procedure Laterality Date   CARDIAC DEFIBRILLATOR PLACEMENT  06/01/08   ICD   COLONOSCOPY     COLONOSCOPY WITH PROPOFOL N/A 07/28/2019   Procedure: COLONOSCOPY WITH PROPOFOL;  Surgeon: Iva Boop, MD;  Location: [facility] ENDOSCOPY;  Service: Endoscopy;  Laterality: N/A;   CORONARY ARTERY BYPASS GRAFT  02-2005   ICD GENERATOR CHANGEOUT N/A 02/03/2017   Procedure: ICD Generator Changeout;  Surgeon: Marinus Maw, MD;  Location[facility] INVASIVE CV LAB;  Service: Cardiovascular;  Laterality: N/A;   ICD GENERATOR CHANGEOUT N/A 11/09/2023   Procedure: ICD GENERATOR CHANGEOUT;  Surgeon: Marinus Maw, MD;  Location: [facility] INVASIVE CV LAB;  Service: Cardiovascular;  Laterality: N/A;   LEAD REVISION/REPAIR N/A 11/09/2023   Procedure: LEAD REVISION/REPAIR;  Surgeon: Marinus Maw, MD;  Location: [facility] INVASIVE CV LAB;  Service: Cardiovascular;  Laterality: N/A;   ORIF ANKLE FRACTURE  1987   left    RIGHT/LEFT HEART CATH AND CORONARY/GRAFT ANGIOGRAPHY N/A 09/20/2019   Procedure: RIGHT/LEFT HEART CATH AND CORONARY/GRAFT ANGIOGRAPHY;  Surgeon: Dolores Patty, MD;  Location: [facility] INVASIVE CV LAB;  Service: Cardiovascular;  Laterality: N/A;   SHOULDER ARTHROSCOPY  02/2010   rt    TEE WITHOUT CARDIOVERSION N/A 10/24/2019   Procedure: TRANSESOPHAGEAL ECHOCARDIOGRAM (TEE);  Surgeon: Dolores Patty, MD;  Location: [facility] ENDOSCOPY;  Service: Cardiovascular;  Laterality: N/A;   TONSILLECTOMY     as a child     Family History  Problem Relation Age of Onset   Heart attack Father 74   Cardiomyopathy Brother    Early death Brother    Heart disease Brother     Colon cancer Neg Hx    Prostate cancer Neg Hx    Diabetes Neg Hx      Social History   Socioeconomic History   Marital status: Married    Spouse name: Not on file   Number of children: 2   Years of education: Not on file   Highest education level: Bachelor's degree (e.g., BA, AB, BS)  Occupational History   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     Employer: [employer]  Tobacco Use   Smoking status: Former  Current packs/day: 0.00    Types: Cigarettes    Quit date: 12/16/1999    Years since quitting: 24.2   Smokeless tobacco: Never  Vaping Use   Vaping status: Never Used  Substance and Sexual Activity   Alcohol use: Not Currently   Drug use: Never   Sexual activity: Not Currently  Other Topics Concern   Not on file  Social History Narrative   Retired    household- pt and wife   2 adult children in [place] also has grandchildren   Former but not current smoker, occasional alcohol no drug use   Social Drivers of Corporate investment banker Strain: Low Risk  (10/27/2023)   Overall Financial Resource Strain (CARDIA)    Difficulty of Paying Living Expenses: Not very hard  Food Insecurity: No Food Insecurity (11/09/2023)   Hunger Vital Sign    Worried About Running Out of Food in the Last Year: Never true    Ran Out of Food in the Last Year: Never true  Transportation Needs: No Transportation Needs (11/09/2023)   PRAPARE - Administrator, Civil Service (Medical): No    Lack of Transportation (Non-Medical): No  Physical Activity: Sufficiently Active (10/27/2023)   Exercise Vital Sign    Days of Exercise per Week: 6 days    Minutes of Exercise per Session: 50 min  Stress: No Stress Concern Present (10/27/2023)   [employer] of Occupational Health - Occupational Stress Questionnaire    Feeling of Stress : Only a little  Social Connections: Socially Integrated (10/27/2023)   Social Connection and  Isolation Panel [NHANES]    Frequency of Communication with Friends and Family: More than three times a week    Frequency of Social Gatherings with Friends and Family: More than three times a week    Attends Religious Services: More than 4 times per year    Active Member of [employer] or Organizations: Yes    Attends Engineer, structural: More than 4 times per year    Marital Status: Married  Catering manager Violence: Not At Risk (11/09/2023)   Humiliation, Afraid, Rape, and Kick questionnaire    Fear of Current or Ex-Partner: No    Emotionally Abused: No    Physically Abused: No    Sexually Abused: No     BP (!) 92/52   Pulse (!) 54   Ht 5\' 9"  (1.753 m)   Wt 148 lb 3.2 oz (67.2 kg)   SpO2 98%   BMI 21.89 kg/m   Physical Exam:  Well appearing NAD HEENT: Unremarkable Neck:  No JVD, no thyromegally Lymphatics:  No adenopathy Back:  No CVA tenderness Lungs:  Clear HEART:  Regular rate rhythm, no murmurs, no rubs, no clicks Abd:  soft, positive bowel sounds, no organomegally, no rebound, no guarding Ext:  2 plus pulses, no edema, no cyanosis, no clubbing Skin:  No rashes no nodules Neuro:  CN II through XII intact, motor grossly intact  EKG - nsr with RBBB  DEVICE  Normal device function.  See PaceArt for details.   Assess/Plan: 1.  Recurrent ventricular tachycardia -his ventricular tachycardia appears to be fairly well controlled on a combination of amiodarone and mexiletine.  He will undergo watchful waiting.  For now I would not recommend VT ablation unless his ventricular tachycardia becomes incessant and slower on medical therapy. 2.  Chronic systolic heart failure - his heart failure symptoms are class II.  He will continue his current medical therapy.  He is encouraged to maintain a low-sodium diet. 3.  Coronary artery disease -he is status post bypass grafting and has no anginal symptoms. 4.  Paroxysmal atrial fibrillation -the patient is maintaining sinus  rhythm.  No change in medications. 5. ICD lead malfunction -He has had an inappropriate shock due to noise and underwent ICD lead revision. He is now doing well.    Lewayne Goodridge, MD

## 2024-02-23 NOTE — Patient Instructions (Signed)
 Medication Instructions:  Your physician recommends that you continue on your current medications as directed. Please refer to the Current Medication list given to you today.  *If you need a refill on your cardiac medications before your next appointment, please call your pharmacy*  Lab Work: None ordered.  You may go to any [facility] Location for your lab work:  [facility] - [address] ([facility]) - [address] [phone] [address]  [place] - [address]   [place]  - [address]   [place] - [address] - [address] [employer] [place]  - [address] ([facility]   If you have labs (blood work) drawn today and your tests are completely normal, you will receive your results only by: [employer] (if you have MyChart)  If you have any lab test that is abnormal or we need to change your treatment, we will call you or send a MyChart message to review the results.  Testing/Procedures: None ordered.  Follow-Up: At [facility], you and your health needs are our priority.  As part of our continuing mission to provide you with exceptional heart care, we have created designated Provider Care Teams.  These Care Teams include your primary Cardiologist (physician) and Advanced Practice Providers (APPs -  Physician Assistants and Nurse Practitioners) who all work together to provide you with the care you need, when you need it.   Your next appointment:   1 year(s)  The format for your next appointment:   In Person  Provider:   Dr Virl Son one of the following Advanced Practice Providers on your designated Care Team:   Francis Dowse, PA-C Casimiro Needle "Mardelle Matte" [place], [place] Earnest Rosier, NP  Note: Remote monitoring is used to monitor your Pacemaker/ ICD from home. This monitoring reduces the number of office visits required to check your  device to one time per year. It allows [place] to keep an eye on the functioning of your device to ensure it is working properly.            Valet parking services will be available as well.

## 2024-03-16 NOTE — Progress Notes (Signed)
 Remote ICD transmission.

## 2024-03-16 NOTE — Addendum Note (Signed)
 Addended by: Elease Etienne A on: 03/16/2024 11:43 AM   Modules accepted: Orders

## 2024-03-17 ENCOUNTER — Telehealth: Payer: Self-pay | Admitting: *Deleted

## 2024-03-17 NOTE — Telephone Encounter (Signed)
   Patient Name: KUE FOX Sr.  DOB: 1951-05-21 MRN: [id number]  Primary Cardiologist: None  Chart reviewed as part of pre-operative protocol coverage.   Simple dental extractions (i.e. 1-2 teeth) and crowns are considered low risk procedures per guidelines and generally do not require any specific cardiac clearance. It is also generally accepted that for simple extractions and dental cleanings, there is no need to interrupt blood thinner therapy.  SBE prophylaxis is not required for the patient from a cardiac standpoint.  I will route this recommendation to the requesting party via Epic fax function and remove from pre-op pool.  Please call with questions.  Roe Rutherford Cora Stetson, PA 03/17/2024, 3:27 PM

## 2024-03-17 NOTE — Telephone Encounter (Signed)
   Pre-operative Risk Assessment    Patient Name: ZEESHAN KORTE Sr.  DOB: 07-22-1951 MRN: [id number]   Date of last office visit: 02/23/24 DR. GREGG TAYLOR Date of next office visit: NONE   Request for Surgical Clearance    Procedure:   BUILD UP CROWN PROCEDURE   IS THE ONLY PROCEDURE TO BE DONE; PER FORM NO EXTRACTIONS TO BE DONE  Date of Surgery:  Clearance TBD                                Surgeon:  DR. Georgianne Fick, DDS Surgeon's Group or Practice Name:  [facility] Phone number:  [phone] Fax number:  [phone]   Type of Clearance Requested:   - Medical  - Pharmacy:  Hold Apixaban (Eliquis)     Type of Anesthesia:  Local    Additional requests/questions:    Elpidio Anis   03/17/2024, 2:53 PM

## 2024-04-19 ENCOUNTER — Encounter: Payer: Self-pay | Admitting: Internal Medicine

## 2024-04-19 ENCOUNTER — Ambulatory Visit (INDEPENDENT_AMBULATORY_CARE_PROVIDER_SITE_OTHER): Payer: Medicare HMO | Admitting: Internal Medicine

## 2024-04-19 VITALS — BP 118/82 | HR 58 | Temp 98.0°F | Resp 16 | Ht 69.0 in | Wt 147.0 lb

## 2024-04-19 DIAGNOSIS — E785 Hyperlipidemia, unspecified: Secondary | ICD-10-CM | POA: Diagnosis not present

## 2024-04-19 DIAGNOSIS — E038 Other specified hypothyroidism: Secondary | ICD-10-CM

## 2024-04-19 DIAGNOSIS — I25810 Atherosclerosis of coronary artery bypass graft(s) without angina pectoris: Secondary | ICD-10-CM | POA: Diagnosis not present

## 2024-04-19 DIAGNOSIS — R739 Hyperglycemia, unspecified: Secondary | ICD-10-CM

## 2024-04-19 LAB — LIPID PANEL
Cholesterol: 149 mg/dL (ref 0–200)
HDL: 68.3 mg/dL (ref >39.00)
LDL Cholesterol: 72 mg/dL (ref 0–99)
NonHDL: 80.95
Total CHOL/HDL Ratio: 2
Triglycerides: 46 mg/dL (ref 0.0–149.0)
VLDL: 9.2 mg/dL (ref 0.0–40.0)

## 2024-04-19 LAB — BASIC METABOLIC PANEL WITH GFR
BUN: 16 mg/dL (ref 6–23)
CO2: 33 meq/L — ABNORMAL HIGH (ref 19–32)
Calcium: 9.7 mg/dL (ref 8.4–10.5)
Chloride: 100 meq/L (ref 96–112)
Creatinine, Ser: 1.07 mg/dL (ref 0.40–1.50)
GFR: 68.72 mL/min (ref >60.00)
Glucose, Bld: 102 mg/dL — ABNORMAL HIGH (ref 70–99)
Potassium: 4 meq/L (ref 3.5–5.1)
Sodium: 139 meq/L (ref 135–145)

## 2024-04-19 LAB — TSH: TSH: 2.43 u[IU]/mL (ref 0.35–5.50)

## 2024-04-19 LAB — T3, FREE: T3, Free: 2.6 pg/mL (ref 2.3–4.2)

## 2024-04-19 LAB — T4, FREE: Free T4: 1.64 ng/dL — ABNORMAL HIGH (ref 0.60–1.60)

## 2024-04-19 NOTE — Patient Instructions (Addendum)
 INSTRUCTIONS  FOR TODAY   Check the  blood pressure regularly Blood pressure goal:  between 110/65 and  135/85. If it is consistently higher or lower, let me know     GO TO THE LAB : Get the blood work     Next office visit for a   physical exam in 6 months Please make an appointment before you leave today

## 2024-04-19 NOTE — Progress Notes (Unsigned)
 Subjective:    Patient ID: Matthew Dell Sr., male    DOB: November 17, 1951, 73 y.o.   MRN: [id number]  DOS:  04/19/2024 Type of visit - description: Routine checkup  Chronic medical problems addressed. Since her last visit, had a new ICD.  He essentially feeling back to baseline although that feels tired slightly easier. Denies chest pain or difficulty breathing No difficulty with respiration, no palpitation. No nausea or vomiting. Plays golf. Takes walks most days, 2 to 4 miles   Review of Systems See above   Past Medical History:  Diagnosis Date   AICD (automatic cardioverter/defibrillator) present 05/2008   St. Jude   Allergy    Arthritis    Bradycardia    Use of beta blocker limited   CAD (coronary artery disease)    a- S/p cabg in march 2006 by Dr.Owen;  b. myoview  5/12: large IL scar from apex to base, no ischemia, EF 37%   Cancer (HCC)    on right side of neck   Clotting disorder [facility])    ED (erectile dysfunction)    Gout    Hyperlipidemia    Ischemic cardiomyopathy    a-Echocardiogram, Nov 2006, showed ejection fraction of 30-40% with mild to moderated mitral  regurgitation and mild aortic regurgitation b- Cardiac MRI, May 2007, EF of 44% with 50% scar involving  the inferolateral walls. No comment of mitral regurgitation. c- last echo  11/10: EF 30-35%  mod MR d- Nega T-Wave alternans testing in May of 2007 e- no ACE due to low BP;  f. CPX 3/11: normal   RBBB (right bundle branch block with left anterior fascicular block)    Systolic CHF, chronic (HCC)    Thyroid  disease     Past Surgical History:  Procedure Laterality Date   CARDIAC DEFIBRILLATOR PLACEMENT  06/01/08   ICD   COLONOSCOPY     COLONOSCOPY WITH PROPOFOL  N/A 07/28/2019   Procedure: COLONOSCOPY WITH PROPOFOL ;  Surgeon: Kenney Peacemaker, MD;  Location: [facility] ENDOSCOPY;  Service: Endoscopy;  Laterality: N/A;   CORONARY ARTERY BYPASS GRAFT  02-2005   ICD GENERATOR CHANGEOUT N/A 02/03/2017   Procedure: ICD  Generator Changeout;  Surgeon: Tammie Fall, MD;  Location: [facility] INVASIVE CV LAB;  Service: Cardiovascular;  Laterality: N/A;   ICD GENERATOR CHANGEOUT N/A 11/09/2023   Procedure: ICD GENERATOR CHANGEOUT;  Surgeon: Tammie Fall, MD;  Location: [facility] INVASIVE CV LAB;  Service: Cardiovascular;  Laterality: N/A;   LEAD REVISION/REPAIR N/A 11/09/2023   Procedure: LEAD REVISION/REPAIR;  Surgeon: Tammie Fall, MD;  Location: [facility] INVASIVE CV LAB;  Service: Cardiovascular;  Laterality: N/A;   ORIF ANKLE FRACTURE  1987   left    RIGHT/LEFT HEART CATH AND CORONARY/GRAFT ANGIOGRAPHY N/A 09/20/2019   Procedure: RIGHT/LEFT HEART CATH AND CORONARY/GRAFT ANGIOGRAPHY;  Surgeon: Mardell Shade, MD;  Location: [facility] INVASIVE CV LAB;  Service: Cardiovascular;  Laterality: N/A;   SHOULDER ARTHROSCOPY  02/2010   rt    TEE WITHOUT CARDIOVERSION N/A 10/24/2019   Procedure: TRANSESOPHAGEAL ECHOCARDIOGRAM (TEE);  Surgeon: Mardell Shade, MD;  Location: [facility] ENDOSCOPY;  Service: Cardiovascular;  Laterality: N/A;   TONSILLECTOMY     as a child    Current Outpatient Medications  Medication Instructions   acetaminophen  (TYLENOL ) 500-1,000 mg, Every 6 hours PRN   amiodarone  (PACERONE ) 200 mg, Oral, Daily   candesartan  (ATACAND ) 8 mg, Oral, Daily, NEEDS FOLLOW UP APPOINTMENT FOR MORE REFILLS   carboxymethylcellul-glycerin (OPTIVE) 0.5-0.9 % ophthalmic solution 1  drop, Daily   cetirizine (ZYRTEC) 10 mg, Daily PRN   Colchicine  (MITIGARE ) 0.6 mg, Oral, 2 times daily PRN   dapagliflozin  propanediol (FARXIGA ) 10 mg, Oral, Daily, (Take BEFORE BREAKFAST)   ELIQUIS  5 MG TABS tablet TAKE 1 TABLET BY MOUTH TWICE A DAY   Fish Oil 3,600 mg, Daily   furosemide  (LASIX ) 40 mg, Oral, Daily   levothyroxine  (SYNTHROID ) 88 mcg, Oral, Daily before breakfast   mexiletine (MEXITIL ) 250 mg, Oral, 2 times daily, NEEDS FOLLOW UP APPOINTMENT FOR MORE REFILLS   nitroGLYCERIN  (NITROSTAT ) 0.4 mg, Sublingual, Every 5 min x3 PRN    rosuvastatin  (CRESTOR ) 40 mg, Oral, Daily   spironolactone  (ALDACTONE ) 12.5 mg, Oral, Daily       Objective:   Physical Exam BP 118/82   Pulse (!) 58   Temp 98 F (36.7 C) (Oral)   Resp 16   Ht 5\' 9"  (1.753 m)   Wt 147 lb (66.7 kg)   SpO2 99%   BMI 21.71 kg/m  General:   Well developed, NAD, BMI noted. HEENT:  Normocephalic . Face symmetric, atraumatic Lungs:  CTA B Normal respiratory effort, no intercostal retractions, no accessory muscle use. Heart: RRR,  no murmur.  Lower extremities: no pretibial edema bilaterally  Skin: Not pale. Not jaundice Neurologic:  alert & oriented X3.  Speech normal, gait appropriate for age and unassisted Psych--  Cognition and judgment appear intact.  Cooperative with normal attention span and concentration.  Behavior appropriate. No anxious or depressed appearing.      Assessment     Assessment Prediabetes Hyperlipidemia Gout Hypothyroidism-  previously subclinical dz, then rx amidarone 08-2020 and TSH increased to 15 12/2020, started levothyroxine  E.D. 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 S/p chemo x 6, XRT x 35; treatment ended 01/2018  PLAN:  Hyperlipidemia: On rosuvastatin , check FLP, last AST ALT normal. Hypothyroidism: On Synthroid , also takes amiodarone , check TFTs. V. tach, heart failure, CAD, paroxysmal A-fib: Since the last office visit they had to place a new ICD, he is feeling back to normal.  Check a BMP. RTC 6 months CPX   Here for CPX - Td 04/2017 - pneumonia shot 2009 and 2016, Prevnar 2015.  PNM 20: 2024 - zostavax 2014; s/p shingrex x 2 -Had a flu shot. - Recommend a COVID-vaccine, RSV.    --CCS: 01-2008: Cscope, tics, no polyps; + IFOB  06/2019, cscope 07/2019 no polyps, + hemorrhoids, no further CCS per  cscope report --Prostate cancer screening: No symptoms, check PSA.Aaron Aas --Diet and exercise: Exercises regularly. --labs:BMP TSH -free T4 -A1c- uric acid- PSA -Healthcare POA: See AVS Diabetes: On Farxiga , check A1c.  Further advised for results Hyperlipidemia: Well-controlled per last FLP History of gout, no recent events, check a uric acid Thyroid  disease: On Synthroid , check labs. Dysphagia: 07/01/2023, saw GI, Dx esophageal dysphagia >> 08/05/2023, modified barium swallow, aspiration noted, undergoing SP  therapy, feels better!  VT, heart failure, CAD, paroxysmal A-fib: 08/10/2023, saw cardiology, stable, was not recommended to have VT ablation RTC 6 months

## 2024-04-20 ENCOUNTER — Encounter: Payer: Self-pay | Admitting: Internal Medicine

## 2024-04-20 NOTE — Assessment & Plan Note (Signed)
 Hyperlipidemia: On rosuvastatin , check FLP, last AST ALT normal. Hypothyroidism: On Synthroid , also takes amiodarone , check TFTs. V. tach, heart failure, CAD, paroxysmal A-fib: Since the last office visit they had to place a new ICD, he is feeling back to normal.  Check a BMP. RTC 6 months CPX

## 2024-04-22 ENCOUNTER — Telehealth: Payer: Self-pay | Admitting: Pharmacist

## 2024-04-22 MED ORDER — DAPAGLIFLOZIN PROPANEDIOL 10 MG PO TABS: 10.0000 mg | ORAL_TABLET | Freq: Every day | ORAL | 2 refills | Status: AC

## 2024-04-22 NOTE — Telephone Encounter (Signed)
 Patient left a message on Clinical Pharmacist Practitioner VM that [facility] needed an updated Rx for Farxiga  for the [place] and [place] program. Rx sent electronically to [id number] Farxiga  10mg  with 2 refills.   Patient notified.

## 2024-04-26 ENCOUNTER — Ambulatory Visit: Payer: Medicare HMO

## 2024-05-03 ENCOUNTER — Ambulatory Visit (HOSPITAL_COMMUNITY)
Admission: RE | Admit: 2024-05-03 | Discharge: 2024-05-03 | Disposition: A | Discharge status: Home / Self Care | Source: Ambulatory Visit | Attending: Internal Medicine | Admitting: Internal Medicine

## 2024-05-03 VITALS — BP 108/56 | HR 55 | Wt 143.6 lb

## 2024-05-03 DIAGNOSIS — I472 Ventricular tachycardia, unspecified: Secondary | ICD-10-CM | POA: Diagnosis not present

## 2024-05-03 DIAGNOSIS — Z79899 Other long term (current) drug therapy: Secondary | ICD-10-CM | POA: Insufficient documentation

## 2024-05-03 DIAGNOSIS — E785 Hyperlipidemia, unspecified: Secondary | ICD-10-CM | POA: Insufficient documentation

## 2024-05-03 DIAGNOSIS — I5022 Chronic systolic (congestive) heart failure: Secondary | ICD-10-CM | POA: Diagnosis not present

## 2024-05-03 DIAGNOSIS — Z923 Personal history of irradiation: Secondary | ICD-10-CM | POA: Diagnosis not present

## 2024-05-03 DIAGNOSIS — Z7984 Long term (current) use of oral hypoglycemic drugs: Secondary | ICD-10-CM | POA: Insufficient documentation

## 2024-05-03 DIAGNOSIS — I480 Paroxysmal atrial fibrillation: Secondary | ICD-10-CM

## 2024-05-03 DIAGNOSIS — I340 Nonrheumatic mitral (valve) insufficiency: Secondary | ICD-10-CM | POA: Diagnosis not present

## 2024-05-03 DIAGNOSIS — Z951 Presence of aortocoronary bypass graft: Secondary | ICD-10-CM | POA: Diagnosis not present

## 2024-05-03 DIAGNOSIS — I081 Rheumatic disorders of both mitral and tricuspid valves: Secondary | ICD-10-CM | POA: Diagnosis not present

## 2024-05-03 DIAGNOSIS — E119 Type 2 diabetes mellitus without complications: Secondary | ICD-10-CM | POA: Insufficient documentation

## 2024-05-03 DIAGNOSIS — I2510 Atherosclerotic heart disease of native coronary artery without angina pectoris: Secondary | ICD-10-CM | POA: Diagnosis not present

## 2024-05-03 DIAGNOSIS — Z95810 Presence of automatic (implantable) cardiac defibrillator: Secondary | ICD-10-CM | POA: Diagnosis not present

## 2024-05-03 DIAGNOSIS — Z4502 Encounter for adjustment and management of automatic implantable cardiac defibrillator: Secondary | ICD-10-CM | POA: Insufficient documentation

## 2024-05-03 DIAGNOSIS — Z9221 Personal history of antineoplastic chemotherapy: Secondary | ICD-10-CM | POA: Insufficient documentation

## 2024-05-03 DIAGNOSIS — Z7901 Long term (current) use of anticoagulants: Secondary | ICD-10-CM | POA: Diagnosis not present

## 2024-05-03 LAB — BRAIN NATRIURETIC PEPTIDE: B Natriuretic Peptide: 542.2 pg/mL — ABNORMAL HIGH (ref 0.0–100.0)

## 2024-05-03 LAB — BASIC METABOLIC PANEL WITH GFR
Anion gap: 6 (ref 5–15)
BUN: 17 mg/dL (ref 8–23)
CO2: 30 mmol/L (ref 22–32)
Calcium: 9.4 mg/dL (ref 8.9–10.3)
Chloride: 102 mmol/L (ref 98–111)
Creatinine, Ser: 1.17 mg/dL (ref 0.61–1.24)
GFR, Estimated: >60 mL/min (ref >60)
Glucose, Bld: 96 mg/dL (ref 70–99)
Potassium: 3.9 mmol/L (ref 3.5–5.1)
Sodium: 138 mmol/L (ref 135–145)

## 2024-05-03 MED ORDER — LOSARTAN POTASSIUM 25 MG PO TABS: 12.5000 mg | ORAL_TABLET | Freq: Every day | ORAL | 3 refills | Status: AC

## 2024-05-03 NOTE — Patient Instructions (Addendum)
 Labs done today. We will contact you only if your labs are abnormal.  EKG done today.   STOP taking Candesartan   START Losartan 12.5mg  (1/2 tablet) by mouth daily at bedtime.   No other medication changes were made. Please continue all current medications as prescribed.  Your physician recommends that you schedule a follow-up appointment in: 3 months. Please contact our office in June to schedule a August appointment.   If you have any questions or concerns before your next appointment please send us  a message through [facility] or call our office at [phone].    TO LEAVE A MESSAGE FOR THE NURSE SELECT OPTION 2, PLEASE LEAVE A MESSAGE INCLUDING: YOUR NAME DATE OF BIRTH CALL BACK NUMBER REASON FOR CALL**this is important as we prioritize the call backs  YOU WILL RECEIVE A CALL BACK THE SAME DAY AS LONG AS YOU CALL BEFORE 4:00 PM   Do the following things EVERYDAY: Weigh yourself in the morning before breakfast. Write it down and keep it in a log. Take your medicines as prescribed Eat low salt foods--Limit salt (sodium) to 2000 mg per day.  Stay as active as you can everyday Limit all fluids for the day to less than 2 liters   At [facility], you and your health needs are our priority. As part of our continuing mission to provide you with exceptional heart care, we have created designated Provider Care Teams. These Care Teams include your primary Cardiologist (physician) and Advanced Practice Providers (APPs- Physician Assistants and Nurse Practitioners) who all work together to provide you with the care you need, when you need it.   You may see any of the following providers on your designated Care Team at your next follow up: Dr Jules Oar Dr Peder Bourdon Dr. Mimi Alt, NP Ruddy Corral, [place] [facility] [place], [place] Dennise Fitz, NP Luster Salters, PharmD   Please be sure to bring in all your medications bottles to  every appointment.    Thank you for choosing [facility]

## 2024-05-03 NOTE — Progress Notes (Signed)
 [facility] Note  Date:  05/03/2024   ID:  Matthew Dell Sr., DOB [birth year], MRN [id number]  Location: Home  Provider location: [facility] Type of Visit: Established patient  PCP:  Matthew Hollow, MD  Cardiologist:  None Primary HF: Matthew Joyce  Chief Complaint: Heart Failure follow-up   History of Present Illness:  Matthew Joyce is a 73 y.o. male with a history of a CAD S/P CABG in 2006 (LIMA -> LAD, SVG -> Ramus, SVG -> OM-2, SVG -> RCA), s/p ICD, ischemic MR, DM, hyperlipidemia, and throat cancer s/p chemo/XRT in 2018   Stress test 08/14/2016 with EF 31%, old infarct, no ischemia.    Developed CP and SOB in early October. Cath on 09/20/19. Coronary anatomy was stable but EF down to 20% on cath with 3+ MR Echo on 09/20/19 read as EF 25-30% with mod MR. (I reviewed echo and EF 20-25% and moderate to severe MR). Since that time has had CPX with only mild HF limitation.  TEE EF 20-25%. 3+MR and severe TR.    On 01/29/20 woke from sleep for unknown reasons. Found to have shock for VT/VF. Saw Dr. Carolynne Joyce   On 07/22/20 had multiple runs of NSVT, SVT and 1 run of VT (20 seconds max rate 220). Felt to have gotten inappropriate ATP for AF.    Seen in 9/21 for ICD firing due to AF with RVR. Started on amio and b-blocker stopped due to bradycardia.    Echo 05/29/20: EF 20% mod to severe RV dysfunction severe central MR Personally reviewed  Echo 5/23 EF 25-30% severe MR RV moderately reduced Echo 04/23/23: EF 25-30% RV mildly down Moderate to severe MR   Here for f/u. Last week he called me with BPs in 80/50 range. Was quite symptomatic. We stopped candesartan . Now  Remains active walking in neighborhood and on TM 4. at 6% No CP,edema, orthopnea or PND. Taking lasix  every other   ICD interrogation: No VT/AF Volume ok. Personally reviewed  Cardiac studies:   Cath 09/20/19   1. 3v CAD  2. Patent LIMA to LAD 3. Patent free RIMA to ramus 4. Occluded  SVG to RCA with widely patent native RCA 5. Unable to locate SVG to OM2 mentioned in OP report. There is no graft marker for this graft and Ao root shot did not show any SVG to OM (suspect may not have been placed) 6. EF 20% with global HK and 3+ MR   PCW = 22 (v = 35-40) Fick cardiac output/index = 4.5/2.4  CPX 06/2020  FVC 3.54 (85%)      FEV1 2.81 (89%)        FEV1/FVC 79 (104%)        MVV 104 (82%)      Peak VO2: 27.5 (99% predicted peak VO2)  VE/VCO2 slope:  30  OUES: 1.99  Peak RER: 0.95    CPX test 10/20/19  BP rest: 92/56 Standing BP: 86/52 BP peak: 116/62  Peak VO2: 24.5 (88% predicted peak VO2)  VE/VCO2 slope:  37  OUES: 2.11  Peak RER: 1.01     TEE 10/24/19  1. Left ventricular ejection fraction 20-25%  2. Global right ventricle has moderately reduced systolic function.  3. Left atrial size was severely dilated.  4. No LAA clot.  5. Right atrial size was mild-moderately dilated.  6. The mitral valve is abnormal. Moderate mitral valve regurgitation.  7. Mildly restricted posterior MV leaflet  with 3+ MR.  8. The tricuspid valve is normal in structure. Tricuspid valve regurgitation is severe.  Past Medical History:  Diagnosis Date   AICD (automatic cardioverter/defibrillator) present 05/2008   [employer]. Jude   Allergy    Arthritis    Bradycardia    Use of beta blocker limited   CAD (coronary artery disease)    a- S/p cabg in march 2006 by Dr.Owen;  b. myoview  5/12: large IL scar from apex to base, no ischemia, EF 37%   Cancer (HCC)    on right side of neck   Clotting disorder [facility])    ED (erectile dysfunction)    Gout    Hyperlipidemia    Ischemic cardiomyopathy    a-Echocardiogram, Nov 2006, showed ejection fraction of 30-40% with mild to moderated mitral  regurgitation and mild aortic regurgitation b- Cardiac MRI, May 2007, EF of 44% with 50% scar involving  the inferolateral walls. No comment of mitral regurgitation. c- last echo  11/10: EF 30-35%  mod MR d-  Nega T-Wave alternans testing in May of 2007 e- no ACE due to low BP;  f. CPX 3/11: normal   RBBB (right bundle branch block with left anterior fascicular block)    Systolic CHF, chronic (HCC)    Thyroid  disease    Past Surgical History:  Procedure Laterality Date   CARDIAC DEFIBRILLATOR PLACEMENT  06/01/08   ICD   COLONOSCOPY     COLONOSCOPY WITH PROPOFOL  N/A 07/28/2019   Procedure: COLONOSCOPY WITH PROPOFOL ;  Surgeon: Matthew Peacemaker, MD;  Location: [facility] ENDOSCOPY;  Service: Endoscopy;  Laterality: N/A;   CORONARY ARTERY BYPASS GRAFT  02-2005   ICD GENERATOR CHANGEOUT N/A 02/03/2017   Procedure: ICD Generator Changeout;  Surgeon: Matthew Fall, MD;  Location: [facility] INVASIVE CV LAB;  Service: Cardiovascular;  Laterality: N/A;   ICD GENERATOR CHANGEOUT N/A 11/09/2023   Procedure: ICD GENERATOR CHANGEOUT;  Surgeon: Matthew Fall, MD;  Location: [facility] INVASIVE CV LAB;  Service: Cardiovascular;  Laterality: N/A;   LEAD REVISION/REPAIR N/A 11/09/2023   Procedure: LEAD REVISION/REPAIR;  Surgeon: Matthew Fall, MD;  Location: [facility] INVASIVE CV LAB;  Service: Cardiovascular;  Laterality: N/A;   ORIF ANKLE FRACTURE  1987   left    RIGHT/LEFT HEART CATH AND CORONARY/GRAFT ANGIOGRAPHY N/A 09/20/2019   Procedure: RIGHT/LEFT HEART CATH AND CORONARY/GRAFT ANGIOGRAPHY;  Surgeon: Matthew Shade, MD;  Location: [facility] INVASIVE CV LAB;  Service: Cardiovascular;  Laterality: N/A;   SHOULDER ARTHROSCOPY  02/2010   rt    TEE WITHOUT CARDIOVERSION N/A 10/24/2019   Procedure: TRANSESOPHAGEAL ECHOCARDIOGRAM (TEE);  Surgeon: Matthew Shade, MD;  Location[facility] ENDOSCOPY;  Service: Cardiovascular;  Laterality: N/A;   TONSILLECTOMY     as a child     Current Outpatient Medications  Medication Sig Dispense Refill   acetaminophen  (TYLENOL ) 500 MG tablet Take 500-1,000 mg by mouth every 6 (six) hours as needed (for pain.).     amiodarone  (PACERONE ) 200 MG tablet Take 1 tablet (200 mg total) by mouth daily. 90 tablet 3    carboxymethylcellul-glycerin (OPTIVE) 0.5-0.9 % ophthalmic solution Place 1 drop into both eyes daily. Refresh     cetirizine (ZYRTEC) 10 MG tablet Take 10 mg by mouth daily as needed for allergies.     Colchicine  (MITIGARE ) 0.6 MG CAPS Take 0.6 mg by mouth 2 (two) times daily as needed (gout flare). 60 capsule 0   dapagliflozin  propanediol (FARXIGA ) 10 MG TABS tablet Take 1 tablet (10 mg total)  by mouth daily. (Take BEFORE BREAKFAST) 90 tablet 2   ELIQUIS  5 MG TABS tablet TAKE 1 TABLET BY MOUTH TWICE A DAY 180 tablet 1   furosemide  (LASIX ) 40 MG tablet TAKE 1 TABLET BY MOUTH EVERY DAY (Patient taking differently: Take 40 mg by mouth every other day.) 90 tablet 3   levothyroxine  (SYNTHROID ) 88 MCG tablet Take 1 tablet (88 mcg total) by mouth daily before breakfast. 90 tablet 1   mexiletine (MEXITIL ) 250 MG capsule Take 1 capsule (250 mg total) by mouth 2 (two) times daily. NEEDS FOLLOW UP APPOINTMENT FOR MORE REFILLS 180 capsule 0   nitroGLYCERIN  (NITROSTAT ) 0.4 MG SL tablet Place 1 tablet (0.4 mg total) under the tongue every 5 (five) minutes x 3 doses as needed for chest pain. 25 tablet 3   Omega-3 Fatty Acids (FISH OIL) 1200 MG CAPS Take 3,600 mg by mouth daily.     rosuvastatin  (CRESTOR ) 40 MG tablet Take 1 tablet (40 mg total) by mouth daily. 90 tablet 1   spironolactone  (ALDACTONE ) 25 MG tablet TAKE 1/2 TABLET BY MOUTH EVERY DAY 45 tablet 3   candesartan  (ATACAND ) 8 MG tablet TAKE 1 TABLET (8 MG TOTAL) BY MOUTH DAILY. NEEDS FOLLOW UP APPOINTMENT FOR MORE REFILLS (Patient not taking: Reported on 05/03/2024) 90 tablet 0   No current facility-administered medications for this encounter.    Allergies:   Atorvastatin, Entresto  [sacubitril-valsartan], Sulfonamide derivatives, Sulfamethoxazole, and Tape   Social History:  The patient  reports that he quit smoking about 24 years ago. His smoking use included cigarettes. He has never used smokeless tobacco. He reports that he does not currently use  alcohol. He reports that he does not use drugs.   Family History:  The patient's family history includes Cardiomyopathy in his brother; Early death in his brother; Heart attack (age of onset: 18) in his father; Heart disease in his brother.   ROS:  Please see the history of present illness.   All other systems are personally reviewed and negative.   Vitals:   05/03/24 1508  BP: (!) 108/56  Pulse: (!) 55  SpO2: 99%  Weight: 65.1 kg (143 lb 9.6 oz)    Wt Readings from Last 3 Encounters:  05/03/24 65.1 kg (143 lb 9.6 oz)  04/19/24 66.7 kg (147 lb)  02/23/24 67.2 kg (148 lb 3.2 oz)    Exam:   General:  Thin No resp difficulty HEENT: normal Neck: supple. no JVD. Carotids 2+ bilat; no bruits. No lymphadenopathy or thryomegaly appreciated. Cor: PMI nondisplaced. Regular rate & rhythm. No rubs, gallops or murmurs. Lungs: clear Abdomen: soft, nontender, nondistended. No hepatosplenomegaly. No bruits or masses. Good bowel sounds. Extremities: no cyanosis, clubbing, rash, edema Neuro: alert & orientedx3, cranial nerves grossly intact. moves all 4 extremities w/o difficulty. Affect pleasant   Recent Labs: 11/06/2023: Hemoglobin 13.6; Platelets 206 04/19/2024: BUN 16; Creatinine, Ser 1.07; Potassium 4.0; Sodium 139; TSH 2.43  Personally reviewed   Wt Readings from Last 3 Encounters:  05/03/24 65.1 kg (143 lb 9.6 oz)  04/19/24 66.7 kg (147 lb)  02/23/24 67.2 kg (148 lb 3.2 oz)      ASSESSMENT AND PLAN:  1. Chronic Systolic Heart Failure ICM ECHO 08/2013 EF 45-50%  Has St Jude ICD 2009 -  Echo 07/2016 LVEF 30-35%, Mod MR, Mod LAE, Mildly dilated RV with mild reduced RV function, PA peak pressure 51 mm Hg.  - TEE 11/20 Left ventricular ejection fraction 20-25% Moderately reduced RV function 3+ MR severe TR -  Echo 6/21 LV is markedly dilated with EF 15-20% severe MR. RV at least moderately reduced. Severe TR - CPX 11/20 with preserved VO2 with moderately elevated slope - CPX 7/21 Peak  VO2: 27.5 (99% predicted peak VO2)  VE/VCO2 slope: 30. RER 0.95 - Echo 5/23 EF 25-30% severe MR RV moderately reduced - Echo 04/23/23: EF 25-30% RV mildly down Moderate to severe MR  - Recently had to pull back on ARB. Raises concern for progressive HF but functional status preserved and ICD interrogation looks ok  - NYHA I-II - Volume ok. Takes lasix  every other day - has not needed to take extra - Off Toprol  XL 12.5 mg with addition of amio and bradycardia - Recently candesartan  8 bid stopped due to low BP. Will switch to losartan 12.5 qhs - Continue Farxiga  10  - Continue spiro - ICD interrogated as above - We again discussed advanced therapies. Has aged oot of transplant and I do think RV may be able to handle VAD but chest may not support with previous CABG and XRT. - Labs today  2. VT/VF - ICD firing x 2 on 03/22/22. Had NSVT (12sec) on 4/11 which self-converted - on amio 200 daily and mexilitene. Previously decrease to 200 qod and had recurrent VT - continue amio 200 daily - ICD interrogated in clinic persoanlly  3. PAF with ICD firing in 9/21 - in NSR. Continue amio - Check labs.  - continue eliquis . No bleeding  4. CAD S/P CABG 2006 - No s/s angina - Coronary angio 10/20 with stable revascularization. - Continue statin. Off ASA with Eliquis  use.  5. Hyperlipidemia - Continue Crestor . Goal LDL < 70  - Per PCP  6. Throat Cancer - on R side. Squamous Cell Carcinoma with unclear primary.  - s/p chemo/XRT at [facility] in 2018 (Finished treatments in 2/19) - remains cancer free on recent eval  7. Severe mitral regurgitation - plan as above. Echo reviewed by Structural Team felt not to be candidate for MitraClip - no change   Signed, Jules Oar, MD  05/03/2024 3:35 PM  [facility] [address] [facility] [place] [address] [phone] (office) [phone] (fax)

## 2024-05-10 ENCOUNTER — Ambulatory Visit (INDEPENDENT_AMBULATORY_CARE_PROVIDER_SITE_OTHER): Payer: Medicare HMO

## 2024-05-10 DIAGNOSIS — I5022 Chronic systolic (congestive) heart failure: Secondary | ICD-10-CM

## 2024-05-10 DIAGNOSIS — I255 Ischemic cardiomyopathy: Secondary | ICD-10-CM

## 2024-05-11 LAB — CUP PACEART REMOTE DEVICE CHECK
Battery Remaining Longevity: 114 mo
Battery Remaining Percentage: 91 %
Battery Voltage: 3.02 V
Brady Statistic RV Percent Paced: 1 %
Date Time Interrogation Session: 20250527030105
HighPow Impedance: 60 Ohm
Implantable Lead Connection Status: 753985
Implantable Lead Implant Date: 20241125
Implantable Lead Location: 753860
Implantable Pulse Generator Implant Date: 20241125
Lead Channel Impedance Value: 480 Ohm
Lead Channel Pacing Threshold Amplitude: 1 V
Lead Channel Pacing Threshold Pulse Width: 0.5 ms
Lead Channel Sensing Intrinsic Amplitude: 12 mV
Lead Channel Setting Pacing Amplitude: 2.5 V
Lead Channel Setting Pacing Pulse Width: 0.5 ms
Lead Channel Setting Sensing Sensitivity: 0.5 mV
Pulse Gen Serial Number: 211016570

## 2024-05-13 ENCOUNTER — Ambulatory Visit: Payer: Self-pay | Admitting: Internal Medicine

## 2024-05-17 ENCOUNTER — Other Ambulatory Visit: Payer: Self-pay

## 2024-05-17 DIAGNOSIS — I480 Paroxysmal atrial fibrillation: Secondary | ICD-10-CM

## 2024-05-17 MED ORDER — APIXABAN 5 MG PO TABS: 5.0000 mg | ORAL_TABLET | Freq: Two times a day (BID) | ORAL | 1 refills | Status: DC

## 2024-05-17 NOTE — Telephone Encounter (Signed)
 Prescription refill request for Eliquis  received. Indication:vtach Last office visit:3/25 Scr:1.17  5/25 Age: 73 Weight:65.1  kg  Prescription refilled

## 2024-05-21 ENCOUNTER — Other Ambulatory Visit: Payer: Self-pay | Admitting: Internal Medicine

## 2024-05-26 ENCOUNTER — Telehealth: Payer: Self-pay

## 2024-05-26 NOTE — Telephone Encounter (Signed)
 Patient returned call to advised No Known Risk. Pt was appreciative of call back.

## 2024-05-26 NOTE — Telephone Encounter (Signed)
 Pt called wanting to know if someone can call him back he has a question about a certain tool that his dentist needs to use one his teeth. The dentist thinks he can't use it because of his ICD

## 2024-05-27 ENCOUNTER — Encounter: Payer: Self-pay | Admitting: Cardiology

## 2024-06-23 ENCOUNTER — Other Ambulatory Visit: Payer: Self-pay | Admitting: Internal Medicine

## 2024-06-27 NOTE — Progress Notes (Signed)
 Remote ICD transmission.

## 2024-06-27 NOTE — Addendum Note (Signed)
 Addended by: TAWNI DRILLING D on: 06/27/2024 11:30 AM   Modules accepted: Orders

## 2024-07-26 ENCOUNTER — Ambulatory Visit: Payer: Medicare HMO

## 2024-08-09 ENCOUNTER — Ambulatory Visit (INDEPENDENT_AMBULATORY_CARE_PROVIDER_SITE_OTHER): Payer: Medicare HMO

## 2024-08-09 DIAGNOSIS — I5022 Chronic systolic (congestive) heart failure: Secondary | ICD-10-CM

## 2024-08-10 ENCOUNTER — Ambulatory Visit: Payer: Self-pay | Admitting: Internal Medicine

## 2024-08-10 LAB — CUP PACEART REMOTE DEVICE CHECK
Battery Remaining Longevity: 111 mo
Battery Remaining Percentage: 89 %
Battery Voltage: 3.02 V
Brady Statistic RV Percent Paced: 1 %
Date Time Interrogation Session: 20250826021350
HighPow Impedance: 55 Ohm
Implantable Lead Connection Status: 753985
Implantable Lead Implant Date: 20241125
Implantable Lead Location: 753860
Implantable Pulse Generator Implant Date: 20241125
Lead Channel Impedance Value: 460 Ohm
Lead Channel Pacing Threshold Amplitude: 1 V
Lead Channel Pacing Threshold Pulse Width: 0.5 ms
Lead Channel Sensing Intrinsic Amplitude: 12 mV
Lead Channel Setting Pacing Amplitude: 2.5 V
Lead Channel Setting Pacing Pulse Width: 0.5 ms
Lead Channel Setting Sensing Sensitivity: 0.5 mV
Pulse Gen Serial Number: 211016570

## 2024-08-19 ENCOUNTER — Other Ambulatory Visit: Payer: Self-pay | Admitting: Internal Medicine

## 2024-08-20 ENCOUNTER — Other Ambulatory Visit: Payer: Self-pay | Admitting: Internal Medicine

## 2024-08-24 ENCOUNTER — Telehealth: Payer: Self-pay | Admitting: Internal Medicine

## 2024-08-24 NOTE — Telephone Encounter (Signed)
*  STAT* If patient is at the pharmacy, call can be transferred to refill team.   1. Which medications need to be refilled? (please list name of each medication and dose if known) amiodarone  (PACERONE ) 200 MG tablet    2. Would you like to learn more about the convenience, safety, & potential cost savings by using the [facility]? No   3. Are you open to using the [facility] (Type [facility]. ) No   4. Which pharmacy/location (including street and city if local pharmacy) is medication to be sent to?  [id number] - [place],  - [address]   5. Do they need a 30 day or 90 day supply? 90 day

## 2024-08-24 NOTE — Telephone Encounter (Signed)
 RX sent in on 08/22/24

## 2024-08-29 NOTE — Progress Notes (Signed)
Remote ICD Transmission.

## 2024-08-30 ENCOUNTER — Telehealth: Payer: Self-pay | Admitting: Internal Medicine

## 2024-08-30 NOTE — Telephone Encounter (Signed)
*  STAT* If patient is at the pharmacy, call can be transferred to refill team.   1. Which medications need to be refilled? (please list name of each medication and dose if known)  amiodarone  (PACERONE ) 200 MG tablet   2. Which pharmacy/location (including street and city if local pharmacy) is medication to be sent to? [facility] - [place], [place] - [address]  3. Do they need a 30 day or 90 day supply?  90 day supply  Previous transmission to pharmacy failed.

## 2024-08-31 MED ORDER — AMIODARONE HCL 200 MG PO TABS: 200.0000 mg | ORAL_TABLET | Freq: Every day | ORAL | 1 refills | Status: AC

## 2024-08-31 NOTE — Telephone Encounter (Signed)
 RX resent in

## 2024-09-14 ENCOUNTER — Ambulatory Visit: Payer: Medicare HMO | Admitting: Pharmacist

## 2024-09-14 DIAGNOSIS — I5022 Chronic systolic (congestive) heart failure: Secondary | ICD-10-CM

## 2024-09-14 DIAGNOSIS — E785 Hyperlipidemia, unspecified: Secondary | ICD-10-CM

## 2024-09-14 NOTE — Progress Notes (Signed)
 Pharmacy Note  09/14/2024 Name: Matthew REECE Sr. MRN: [id number] DOB: 10/26/1951  Subjective: Matthew JONETTA Cordia Sr. is a 73 y.o. year old male who is a primary care patient of Amon Aloysius BRAVO, MD. Clinical Pharmacist Practitioner referral was placed to assist with medication and chronic care management.  Initial referral from 02/29/2020  Engaged with patient by telephone for follow up visit today.  Hypertension / CHF Current therapy: losartan  25mg  - take 0.5 tablet daily at bedtime, spironolacton 25mg  0.5 tablet = 12.5mg  daily, furosemide  40mg  every OTHER day and Farxiga  10mg  daily.   Patient has received Farxiga  10mg  from [place] and [place] program fro 2025. Will end 12/14/2024  BP Readings from Last 3 Encounters:  05/03/24 (!) 108/56  04/19/24 118/82  02/23/24 (!) 92/52    Reviewed refill history - has filled maintenance medications on time in 2025.   ECHO 04/23/2023 show stable EF of 25-30%  Patient state he feels wells. He still walks and golfs on a regular basis for exercise.   Hyperlipidemia:  Last LDL was a little above goal of < 55  Taking rosuvastatin  40mg  daily - LR for 90 DS was 09/09/2024   Objective:   Lab Results  Component Value Date   CREATININE 1.17 05/03/2024   CREATININE 1.07 04/19/2024   CREATININE 0.98 11/06/2023    Lab Results  Component Value Date   HGBA1C 6.0 10/20/2023       Component Value Date/Time   CHOL 149 04/19/2024 0817   TRIG 46.0 04/19/2024 0817   TRIG 45 12/16/2006 0912   HDL 68.30 04/19/2024 0817   CHOLHDL 2 04/19/2024 0817   VLDL 9.2 04/19/2024 0817   LDLCALC 72 04/19/2024 0817     Clinical ASCVD: Yes  The ASCVD Risk score (Arnett DK, et al., 2019) failed to calculate for the following reasons:   Risk score cannot be calculated because patient has a medical history suggesting prior/existing ASCVD    BP Readings from Last 3 Encounters:  05/03/24 (!) 108/56  04/19/24 118/82  02/23/24 (!) 92/52     Allergies  Allergen  Reactions   Atorvastatin      myalgia, Muscle aching   Entresto  [Sacubitril-Valsartan] Itching    Had itching and redness   Sulfonamide Derivatives     Rash and hot flashes   Sulfamethoxazole Rash and Other (See Comments)    Hot flashes   Tape Other (See Comments)    Rips skin- use PAPER tape    Medications Reviewed Today     Reviewed by Carla Milling, RPH-CPP (Pharmacist) on 09/14/24 at 1443  Med List Status: <None>   Medication Order Taking? Sig Documenting Provider Last Dose Status Informant  acetaminophen  (TYLENOL ) 500 MG tablet [id number] No Take 500-1,000 mg by mouth every 6 (six) hours as needed (for pain.). [provider] Taking Active Self           Med Note CONNIE, LENA B   Wed Oct 12, 2019  2:03 PM)    amiodarone  (PACERONE ) 200 MG tablet [id number]  Take 1 tablet (200 mg total) by mouth daily. Waddell Danelle ORN, MD  Active   apixaban  (ELIQUIS ) 5 MG TABS tablet [id number]  Take 1 tablet (5 mg total) by mouth 2 (two) times daily. Waddell Danelle ORN, MD  Active   carboxymethylcellul-glycerin (OPTIVE) 0.5-0.9 % ophthalmic solution [id number] No Place 1 drop into both eyes daily. Refresh [provider] Taking Active Self  cetirizine (ZYRTEC) 10 MG tablet [id number] No Take 10 mg  by mouth daily as needed for allergies. [provider] Taking Active Self  Colchicine  (MITIGARE ) 0.6 MG CAPS [id number] No Take 0.6 mg by mouth 2 (two) times daily as needed (gout flare). Amon Aloysius BRAVO, MD Taking Active Self           Med Note CHRISTIE ALEXANDER   Fri Nov 06, 2023  1:35 PM)    dapagliflozin  propanediol (FARXIGA ) 10 MG TABS tablet [id number] No Take 1 tablet (10 mg total) by mouth daily. (Take BEFORE BREAKFAST) Paz, Jose E, MD Taking Active   furosemide  (LASIX ) 40 MG tablet [id number] No TAKE 1 TABLET BY MOUTH EVERY DAY  Patient taking differently: Take 40 mg by mouth every other day.   Bensimhon, Toribio SAUNDERS, MD Taking Active Self  levothyroxine  (SYNTHROID ) 88 MCG tablet  [id number]  Take 1 tablet (88 mcg total) by mouth daily before breakfast. Paz, Jose E, MD  Active   losartan  (COZAAR ) 25 MG tablet [id number]  Take 0.5 tablets (12.5 mg total) by mouth at bedtime. Bensimhon, Daniel R, MD  Active   mexiletine (MEXITIL ) 250 MG capsule [id number]  TAKE 1 CAPSULE (250 MG TOTAL) BY MOUTH 2 (TWO) TIMES DAILY. NEEDS FOLLOW UP APPT Bensimhon, Toribio SAUNDERS, MD  Active   nitroGLYCERIN  (NITROSTAT ) 0.4 MG SL tablet [id number] No Place 1 tablet (0.4 mg total) under the tongue every 5 (five) minutes x 3 doses as needed for chest pain. Amon Aloysius BRAVO, MD Taking Active Self           Med Note ANNELL, JEANNINE HERO   Tue May 03, 2024  3:13 PM)    Omega-3 Fatty Acids (FISH OIL) 1200 MG CAPS [id number] No Take 3,600 mg by mouth daily. [provider] Taking Active Self  rosuvastatin  (CRESTOR ) 40 MG tablet [id number]  Take 1 tablet (40 mg total) by mouth daily. Amon Aloysius BRAVO, MD  Active   spironolactone  (ALDACTONE ) 25 MG tablet [id number] No TAKE 1/2 TABLET BY MOUTH EVERY DAY Bensimhon, Toribio SAUNDERS, MD Taking Active             Patient Active Problem List   Diagnosis Date Noted   ICD (implantable cardioverter-defibrillator) lead failure 11/09/2023   Presence of implantable cardioverter-defibrillator (ICD) 06/26/2022   VT (ventricular tachycardia) (HCC) 07/02/2021   Paroxysmal atrial fibrillation (HCC) 07/02/2021   Heme + stool    Internal and external prolapsed hemorrhoids    Difficulty swallowing 01/21/2018   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 (HCC) 12/24/2017   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 (HCC) 11/12/2017   PCP NOTES >>>>>>>>>>>>>>>>>>>>> 04/21/2016   Seborrheic dermatitis of scalp 04/11/2013   Annual physical exam 04/11/2011   MITRAL REGURGITATION 04/10/2010   SYSTOLIC HEART FAILURE, CHRONIC 10/03/2009   Hyperglycemia 06/19/2008   Coronary atherosclerosis 06/14/2008   Automatic  implantable cardioverter-defibrillator in situ 06/01/2008   ERECTILE DYSFUNCTION 12/17/2007   Hyperlipidemia 02/06/2007   Gout 02/06/2007   RBBB 02/06/2007   Congestive heart failure (HCC) 02/06/2007   CORONARY ARTERY BYPASS GRAFT, FOUR VESSEL, HX OF 02/12/2005     Medication Assistance:  Farxiga  obtained through [place] and [place] Program  medication assistance program.  Enrollment ends 12/14/2024      Assessment / Plan: Hyperlipidemia - LDL goal < 55 - last LDL was slightly above goal Continue rosuvastatin  daily  Continue to exercise regularly - goal is at least 150 minutes per week.   HFrEF - stable Continue Farxiga  10mg  daily, spironolactone  12.5mg   daily, losartan  12.5mg  daily and furosemide  40mg  every OTHER day Patient has received notification from [facility] and Me regarding re-enrollment for 2026 for Farxiga  patient assistance program.   Reminded patient when selecting 2026 Medicare plan to check all medications costs and pay attention to deductible.    Follow Up:  Medication Assistance Team will be following up with patient regarding 2026 [place] and [place] re-enrollment over the next 2 to 4 weeks.    Madelin Ray, PharmD Clinical Pharmacist [facility]  - [facility] [phone]

## 2024-09-16 ENCOUNTER — Telehealth: Payer: Self-pay

## 2024-09-16 NOTE — Telephone Encounter (Signed)
 PAP: Patient assistance application for Farxiga  through [employer] ([employer]&Me) has been mailed to pt's home address on file. Provider portion of application will be faxed to provider's office.For renewal 2026.

## 2024-09-21 NOTE — Telephone Encounter (Signed)
 Received provider portion of PAP application for Farxiga  (AZ&ME) for renewal 2026.

## 2024-10-03 ENCOUNTER — Other Ambulatory Visit (HOSPITAL_COMMUNITY): Payer: Self-pay

## 2024-10-03 NOTE — Telephone Encounter (Signed)
 PAP: Application for Matthew Joyce has been submitted to [employer] (AZ&Me), via fax

## 2024-10-19 ENCOUNTER — Telehealth: Payer: Self-pay

## 2024-10-19 NOTE — Telephone Encounter (Signed)
 Are you able to help with this? It looks like back in 10/2023 insurance said they would cover it but then after it was done they did not cover it

## 2024-10-19 NOTE — Telephone Encounter (Signed)
 Pt called in wanting to know if he can get some documentation for his ICD implant that was placed 11/09/2023 so that he can file with insurance? He wants the procedure information and the codes that was used at the hospital. Pt would like a call back to discuss

## 2024-10-20 NOTE — Telephone Encounter (Signed)
 Returned call regarding ICD implant done 11/09/23. He indicated that he wanted procedure information and the codes that was used at the hospital. I returned his call and left a vm advising that any hospital information should come from them. I gave their call back # [phone].

## 2024-10-25 ENCOUNTER — Ambulatory Visit: Payer: Medicare HMO

## 2024-11-01 ENCOUNTER — Ambulatory Visit (INDEPENDENT_AMBULATORY_CARE_PROVIDER_SITE_OTHER): Payer: Medicare HMO | Admitting: *Deleted

## 2024-11-01 VITALS — BP 123/68 | Ht 69.0 in | Wt 147.0 lb

## 2024-11-01 DIAGNOSIS — Z0000 Encounter for general adult medical examination without abnormal findings: Secondary | ICD-10-CM

## 2024-11-01 NOTE — Progress Notes (Addendum)
 Please attest this visit in the absence of patient primary care provider.    Chief Complaint  Patient presents with   Medicare Wellness     Subjective:   Matthew JONETTA Cordia Sr. is a 73 y.o. male who presents for a Medicare Annual Wellness Visit.  Allergies (verified) Atorvastatin, Entresto  [sacubitril-valsartan], Sulfonamide derivatives, Sulfamethoxazole, and Tape   History: Past Medical History:  Diagnosis Date   AICD (automatic cardioverter/defibrillator) present 05/2008   [employer] Jude   Allergy    Arthritis    Bradycardia    Use of beta blocker limited   CAD (coronary artery disease)    a- S/p cabg in march 2006 by Dr.Owen;  b. myoview  5/12: large IL scar from apex to base, no ischemia, EF 37%   Cancer (HCC)    on right side of neck   Clotting disorder    ED (erectile dysfunction)    Gout    Hyperlipidemia    Ischemic cardiomyopathy    a-Echocardiogram, Nov 2006, showed ejection fraction of 30-40% with mild to moderated mitral  regurgitation and mild aortic regurgitation b- Cardiac MRI, May 2007, EF of 44% with 50% scar involving  the inferolateral walls. No comment of mitral regurgitation. c- last echo  11/10: EF 30-35%  mod MR d- Nega T-Wave alternans testing in May of 2007 e- no ACE due to low BP;  f. CPX 3/11: normal   RBBB (right bundle branch block with left anterior fascicular block)    Systolic CHF, chronic (HCC)    Thyroid  disease    Past Surgical History:  Procedure Laterality Date   CARDIAC DEFIBRILLATOR PLACEMENT  06/01/08   ICD   COLONOSCOPY     COLONOSCOPY WITH PROPOFOL  N/A 07/28/2019   Procedure: COLONOSCOPY WITH PROPOFOL ;  Surgeon: Avram Lupita BRAVO, MD;  Location: [facility] ENDOSCOPY;  Service: Endoscopy;  Laterality: N/A;   CORONARY ARTERY BYPASS GRAFT  02-2005   ICD GENERATOR CHANGEOUT N/A 02/03/2017   Procedure: ICD Generator Changeout;  Surgeon: Danelle LELON Birmingham, MD;  Location: [facility] INVASIVE CV LAB;  Service: Cardiovascular;  Laterality: N/A;   ICD GENERATOR  CHANGEOUT N/A 11/09/2023   Procedure: ICD GENERATOR CHANGEOUT;  Surgeon: Birmingham Danelle LELON, MD;  Location: [facility] INVASIVE CV LAB;  Service: Cardiovascular;  Laterality: N/A;   LEAD REVISION/REPAIR N/A 11/09/2023   Procedure: LEAD REVISION/REPAIR;  Surgeon: Birmingham Danelle LELON, MD;  Location: [facility] INVASIVE CV LAB;  Service: Cardiovascular;  Laterality: N/A;   ORIF ANKLE FRACTURE  1987   left    RIGHT/LEFT HEART CATH AND CORONARY/GRAFT ANGIOGRAPHY N/A 09/20/2019   Procedure: RIGHT/LEFT HEART CATH AND CORONARY/GRAFT ANGIOGRAPHY;  Surgeon: Cherrie Toribio SAUNDERS, MD;  Location: [facility] INVASIVE CV LAB;  Service: Cardiovascular;  Laterality: N/A;   SHOULDER ARTHROSCOPY  02/2010   rt    TEE WITHOUT CARDIOVERSION N/A 10/24/2019   Procedure: TRANSESOPHAGEAL ECHOCARDIOGRAM (TEE);  Surgeon: Cherrie Toribio SAUNDERS, MD;  Location: [facility] ENDOSCOPY;  Service: Cardiovascular;  Laterality: N/A;   TONSILLECTOMY     as a child   Family History  Problem Relation Age of Onset   Heart attack Father 66   Cardiomyopathy Brother    Early death Brother    Heart disease Brother    Gallstones Brother    Colon cancer Neg Hx    Prostate cancer Neg Hx    Diabetes Neg Hx    Social History   Occupational History   Occupation: retired doctor, 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:  [employer]  Tobacco Use   Smoking status: Former    Current packs/day: 0.00    Types: Cigarettes    Quit date: 12/16/1999    Years since quitting: 24.8   Smokeless tobacco: Current    Types: Chew  Vaping Use   Vaping status: Never Used  Substance and Sexual Activity   Alcohol use: Not Currently   Drug use: Never   Sexual activity: Not Currently   Tobacco Counseling Ready to quit: Not Answered Counseling given: Not Answered  SDOH Screenings   Food Insecurity: No Food Insecurity (11/01/2024)  Housing: Low Risk  (11/01/2024)  Transportation Needs: No Transportation Needs (11/01/2024)  Utilities: Not At  Risk (11/01/2024)  Alcohol Screen: Low Risk  (10/27/2023)  Depression (PHQ2-9): Low Risk  (11/01/2024)  Financial Resource Strain: Low Risk  (10/27/2023)  Physical Activity: Sufficiently Active (11/01/2024)  Social Connections: Socially Integrated (11/01/2024)  Stress: No Stress Concern Present (11/01/2024)  Tobacco Use: High Risk (11/01/2024)  Health Literacy: Adequate Health Literacy (10/28/2023)   See flowsheets for full screening details  Depression Screen PHQ 2 & 9 Depression Scale- Over the past 2 weeks, how often have you been bothered by any of the following problems? Little interest or pleasure in doing things: 0 Feeling down, depressed, or hopeless (PHQ Adolescent also includes...irritable): 0 PHQ-2 Total Score: 0 Trouble falling or staying asleep, or sleeping too much: 0 Feeling tired or having little energy: 0 Poor appetite or overeating (PHQ Adolescent also includes...weight loss): 0 Feeling bad about yourself - or that you are a failure or have let yourself or your family down: 1 Trouble concentrating on things, such as reading the newspaper or watching television (PHQ Adolescent also includes...like school work): 0 (notes memory isn't what it used to be) Moving or speaking so slowly that other people could have noticed. Or the opposite - being so fidgety or restless that you have been moving around a lot more than usual: 0 Thoughts that you would be better off dead, or of hurting yourself in some way: 0 PHQ-9 Total Score: 1  Depression Treatment Depression Interventions/Treatment : PHQ2-9 Score <4 Follow-up Not Indicated     Goals Addressed             This Visit's Progress    Patient Stated   On track    Increase activity     To get golf scores back to level prior to cancer         Visit info / Clinical Intake: Medicare Wellness Visit Type:: Subsequent Annual Wellness Visit Persons participating in visit:: patient Medicare Wellness Visit Mode::  Telephone If telephone:: video declined Because this visit was a virtual/telehealth visit:: pt reported vitals If Telephone or Video please confirm:: I connected with the patient using audio enabled telemedicine application and verified that I am speaking with the correct person using two identifiers; I discussed the limitations of evaluation and management by telemedicine; The patient expressed understanding and agreed to proceed Patient Location:: home Provider Location:: office Information given by:: patient Interpreter Needed?: No Pre-visit prep was completed: yes AWV questionnaire completed by patient prior to visit?: yes Date:: 10/25/24 Living arrangements:: lives with spouse/significant other Patient's Overall Health Status Rating: very good Typical amount of pain: some Does pain affect daily life?: no Are you currently prescribed opioids?: no  Dietary Habits and Nutritional Risks How many meals a day?: 3 (light breakfast and lunch) Eats fruit and vegetables daily?: yes Most meals are obtained by: preparing own meals In the  last 2 weeks, have you had any of the following?: none Diabetic:: no  Functional Status Activities of Daily Living (to include ambulation/medication): (Patient-Rptd) Independent Ambulation: (Patient-Rptd) Independent Medication Administration: Independent Home Management: (Patient-Rptd) Independent Manage your own finances?: yes Primary transportation is: driving Concerns about vision?: no *vision screening is required for WTM* (Up to date with [facility]) Concerns about hearing?: (!) yes (slightly worse but still hears ok) Uses hearing aids?: no  Fall Screening Falls in the past year?: (Patient-Rptd) 0 Number of falls in past year: (Patient-Rptd) 0 Was there an injury with Fall?: (Patient-Rptd) 0 Fall Risk Category Calculator: (Patient-Rptd) 0 Patient Fall Risk Level: (Patient-Rptd) Low Fall Risk  Fall Risk Patient at Risk for Falls Due to: No  Fall Risks Fall risk Follow up: Falls evaluation completed  Home and Transportation Safety: All rugs have non-skid backing?: yes All stairs or steps have railings?: yes Grab bars in the bathtub or shower?: (!) no Have non-skid surface in bathtub or shower?: (!) no Good home lighting?: yes Regular seat belt use?: yes Hospital stays in the last year:: (!) yes How many hospital stays:: 1 Reason: ICD  Cognitive Assessment Difficulty concentrating, remembering, or making decisions? : yes (notes some concerns with short term memory since heart surgery in 2006) Will 6CIT or Mini Cog be Completed: yes What year is it?: 0 points What month is it?: 0 points Give patient an address phrase to remember (5 components): [address], [place]  About what time is it?: 0 points Count backwards from 20 to 1: 0 points Say the months of the year in reverse: 0 points Repeat the address phrase from earlier: 0 points 6 CIT Score: 0 points  Advance Directives (For Healthcare) Does Patient Have a Medical Advance Directive?: Yes Does patient want to make changes to medical advance directive?: No - Patient declined Type of Advance Directive: Living will; Healthcare Power of Attorney Copy of Healthcare Power of Attorney in Chart?: No - copy requested Copy of Living Will in Chart?: No - copy requested  Reviewed/Updated  Reviewed/Updated: Reviewed All (Medical, Surgical, Family, Medications, Allergies, Care Teams, Patient Goals)        Objective:    Today's Vitals   11/01/24 0824  BP: 123/68  Weight: 147 lb (66.7 kg)  Height: 5' 9 (1.753 m)   Body mass index is 21.71 kg/m.  Current Medications (verified) Outpatient Encounter Medications as of 11/01/2024  Medication Sig   acetaminophen  (TYLENOL ) 500 MG tablet Take 500-1,000 mg by mouth every 6 (six) hours as needed (for pain.).   amiodarone  (PACERONE ) 200 MG tablet Take 1 tablet (200 mg total) by mouth daily.   apixaban  (ELIQUIS ) 5 MG  TABS tablet Take 1 tablet (5 mg total) by mouth 2 (two) times daily.   carboxymethylcellul-glycerin (OPTIVE) 0.5-0.9 % ophthalmic solution Place 1 drop into both eyes daily. Refresh   cetirizine (ZYRTEC) 10 MG tablet Take 10 mg by mouth daily as needed for allergies.   Colchicine  (MITIGARE ) 0.6 MG CAPS Take 0.6 mg by mouth 2 (two) times daily as needed (gout flare).   dapagliflozin  propanediol (FARXIGA ) 10 MG TABS tablet Take 1 tablet (10 mg total) by mouth daily. (Take BEFORE BREAKFAST)   furosemide  (LASIX ) 40 MG tablet TAKE 1 TABLET BY MOUTH EVERY DAY (Patient taking differently: Take 40 mg by mouth every other day.)   levothyroxine  (SYNTHROID ) 88 MCG tablet Take 1 tablet (88 mcg total) by mouth daily before breakfast.   losartan  (COZAAR ) 25 MG tablet Take  0.5 tablets (12.5 mg total) by mouth at bedtime.   mexiletine (MEXITIL ) 250 MG capsule TAKE 1 CAPSULE (250 MG TOTAL) BY MOUTH 2 (TWO) TIMES DAILY. NEEDS FOLLOW UP APPT   nitroGLYCERIN  (NITROSTAT ) 0.4 MG SL tablet Place 1 tablet (0.4 mg total) under the tongue every 5 (five) minutes x 3 doses as needed for chest pain.   Omega-3 Fatty Acids (FISH OIL) 1200 MG CAPS Take 3,600 mg by mouth daily.   rosuvastatin  (CRESTOR ) 40 MG tablet Take 1 tablet (40 mg total) by mouth daily.   spironolactone  (ALDACTONE ) 25 MG tablet TAKE 1/2 TABLET BY MOUTH EVERY DAY   No facility-administered encounter medications on file as of 11/01/2024.   Hearing/Vision screen No results found. Immunizations and Health Maintenance Health Maintenance  Topic Date Due   COVID-19 Vaccine (4 - 2025-26 season) 11/01/2025 (Originally 08/15/2024)   Medicare Annual Wellness (AWV)  11/01/2025   DTaP/Tdap/Td (3 - Tdap) 04/28/2027   Colonoscopy  07/27/2029   Pneumococcal Vaccine: 50+ Years  Completed   Influenza Vaccine  Completed   Hepatitis C Screening  Completed   Zoster Vaccines- Shingrix  Completed   Meningococcal B Vaccine  Aged Out        Assessment/Plan:  This is a  routine wellness examination for Fabrizio.  Patient Care Team: Amon Aloysius BRAVO, MD as PCP - General (Internal Medicine) Waddell Danelle ORN, MD as PCP - Electrophysiology (Cardiology) Bensimhon, Toribio SAUNDERS, MD as Consulting Physician (Cardiology) Jacques Lenis, MD as Referring Physician Waddell Danelle ORN, MD as Consulting Physician (Cardiology) Carla Milling, RPH-CPP (Pharmacist)  I have personally reviewed and noted the following in the patient's chart:   Medical and social history Use of alcohol, tobacco or illicit drugs  Current medications and supplements including opioid prescriptions. Functional ability and status Nutritional status Physical activity Advanced directives List of other physicians Hospitalizations, surgeries, and ER visits in previous 12 months Vitals Screenings to include cognitive, depression, and falls Referrals and appointments  No orders of the defined types were placed in this encounter.  In addition, I have reviewed and discussed with patient certain preventive protocols, quality metrics, and best practice recommendations. A written personalized care plan for preventive services as well as general preventive health recommendations were provided to patient.   Lolita Libra, CMA   11/01/2024   Return in 1 year (on 11/01/2025).  After Visit Summary: (MyChart) Due to this being a telephonic visit, the after visit summary with patients personalized plan was offered to patient via MyChart   Nurse Notes: nothing significant to report

## 2024-11-01 NOTE — Patient Instructions (Addendum)
 [name],  Thank you for taking the time for your Medicare Wellness Visit. I appreciate your continued commitment to your health goals. Please review the care plan we discussed, and feel free to reach out if I can assist you further.  Please note that Annual Wellness Visits do not include a physical exam. Some assessments may be limited, especially if the visit was conducted virtually. If needed, we may recommend an in-person follow-up with your provider.  Ongoing Care Seeing your primary care provider every 3 to 6 months helps us  monitor your health and provide consistent, personalized care.   Dr Amon: Pt will call to reschedule CPE. Annual Wellness Visit: 11/03/25  8:20am, telephone  Recommended Screenings:  Health Maintenance  Topic Date Due   Medicare Annual Wellness Visit  10/27/2024   COVID-19 Vaccine (4 - 2025-26 season) 11/01/2025*   DTaP/Tdap/Td vaccine (3 - Tdap) 04/28/2027   Colon Cancer Screening  07/27/2029   Pneumococcal Vaccine for age over 23  Completed   Flu Shot  Completed   Hepatitis C Screening  Completed   Zoster (Shingles) Vaccine  Completed   Meningitis B Vaccine  Aged Out  *Topic was postponed. The date shown is not the original due date.       10/25/2024   10:06 AM  Advanced Directives  Does Patient Have a Medical Advance Directive? Yes  Type of Advance Directive Living will;Healthcare Power of Attorney  Does patient want to make changes to medical advance directive? No - Patient declined  Copy of Healthcare Power of Attorney in Chart? No - copy requested   Bring a copy of your health care power of attorney and living will to the office to be added to your chart at your convenience. You can mail a copy to [facility] [address] [place], [place] [address] or email to [email or web address]   Vision: Annual vision screenings are recommended for early detection of glaucoma, cataracts, and diabetic retinopathy. These exams can  also reveal signs of chronic conditions such as diabetes and high blood pressure.  Dental: Annual dental screenings help detect early signs of oral cancer, gum disease, and other conditions linked to overall health, including heart disease and diabetes.  Please see the attached documents for additional preventive care recommendations.

## 2024-11-05 ENCOUNTER — Other Ambulatory Visit: Payer: Self-pay | Admitting: Internal Medicine

## 2024-11-05 DIAGNOSIS — I480 Paroxysmal atrial fibrillation: Secondary | ICD-10-CM

## 2024-11-07 NOTE — Telephone Encounter (Signed)
 Eliquis  5mg  refill request received. Patient is 73 years old, weight-66.7kg, Crea-1.17 on 05/03/24, Diagnosis-Afib, and last seen by Dr. Waddell on 02/23/24. Dose is appropriate based on dosing criteria. Will send in refill to requested pharmacy.

## 2024-11-08 ENCOUNTER — Encounter: Admitting: Internal Medicine

## 2024-11-08 ENCOUNTER — Ambulatory Visit (INDEPENDENT_AMBULATORY_CARE_PROVIDER_SITE_OTHER): Payer: Medicare HMO

## 2024-11-08 DIAGNOSIS — I5022 Chronic systolic (congestive) heart failure: Secondary | ICD-10-CM

## 2024-11-08 LAB — CUP PACEART REMOTE DEVICE CHECK
Battery Remaining Longevity: 109 mo
Battery Remaining Percentage: 87 %
Battery Voltage: 3.02 V
Brady Statistic RV Percent Paced: 1 %
Date Time Interrogation Session: 20251125031855
HighPow Impedance: 50 Ohm
Implantable Lead Connection Status: 753985
Implantable Lead Implant Date: 20241125
Implantable Lead Location: 753860
Implantable Pulse Generator Implant Date: 20241125
Lead Channel Impedance Value: 440 Ohm
Lead Channel Pacing Threshold Amplitude: 1 V
Lead Channel Pacing Threshold Pulse Width: 0.5 ms
Lead Channel Sensing Intrinsic Amplitude: 12 mV
Lead Channel Setting Pacing Amplitude: 2.5 V
Lead Channel Setting Pacing Pulse Width: 0.5 ms
Lead Channel Setting Sensing Sensitivity: 0.5 mV
Pulse Gen Serial Number: 211016570

## 2024-11-09 NOTE — Progress Notes (Signed)
 Remote ICD Transmission

## 2024-11-16 ENCOUNTER — Ambulatory Visit: Payer: Self-pay | Admitting: Internal Medicine

## 2024-11-18 ENCOUNTER — Other Ambulatory Visit: Payer: Self-pay | Admitting: Internal Medicine

## 2024-12-07 ENCOUNTER — Other Ambulatory Visit: Payer: Self-pay | Admitting: Internal Medicine

## 2024-12-11 ENCOUNTER — Encounter: Payer: Self-pay | Admitting: Emergency Medicine

## 2024-12-11 ENCOUNTER — Ambulatory Visit
Admission: EM | Admit: 2024-12-11 | Discharge: 2024-12-11 | Disposition: A | Discharge status: Home / Self Care | Attending: Family Medicine | Admitting: Family Medicine

## 2024-12-11 ENCOUNTER — Other Ambulatory Visit: Payer: Self-pay

## 2024-12-11 DIAGNOSIS — J209 Acute bronchitis, unspecified: Secondary | ICD-10-CM | POA: Diagnosis not present

## 2024-12-11 MED ORDER — BENZONATATE 200 MG PO CAPS: 200.0000 mg | ORAL_CAPSULE | Freq: Three times a day (TID) | ORAL | 0 refills | Status: AC | PRN

## 2024-12-11 MED ORDER — AZITHROMYCIN 250 MG PO TABS: ORAL_TABLET | ORAL | 0 refills | Status: DC

## 2024-12-11 MED ORDER — DOXYCYCLINE HYCLATE 100 MG PO CAPS: 100.0000 mg | ORAL_CAPSULE | Freq: Two times a day (BID) | ORAL | 0 refills | Status: DC

## 2024-12-11 MED ORDER — DOXYCYCLINE HYCLATE 100 MG PO CAPS: 100.0000 mg | ORAL_CAPSULE | Freq: Two times a day (BID) | ORAL | 0 refills | Status: AC

## 2024-12-11 NOTE — Discharge Instructions (Signed)
 Take the antibiotic doxycycline  2 times a day.  Take the antibiotic with food. Make certain you are drinking lots of fluids May take Tessalon  2 or 3 times a day to help with cough See your doctor if not improving in a week

## 2024-12-11 NOTE — ED Triage Notes (Signed)
 Patient presents to Urgent Care with complaints of cough, chills, body aches, tickle in throat since 5 days ago. Patient reports taking Dayquil and Nyquil for symptoms.

## 2024-12-11 NOTE — ED Provider Notes (Signed)
 " [facility]    CSN: [id number] Arrival date & time: 12/11/24  1445      History   Chief Complaint Chief Complaint  Patient presents with   Cough   Nasal Congestion    HPI Matthew Joyce Sr. is a 73 y.o. male.   Patient has a terrible cough and congestion.  He states there is a rattle in his chest.  He is coughing up mucus.  He states that he is feeling tired.  No known exposure to illness.  His symptoms been going on for just under a week.  Also has copious runny nose, clear mucus, some sinus congestion.  No headache or bodyaches.  No sweats chills or fever.  No underlying lung disease.  Quit smoking 20 years ago.    Past Medical History:  Diagnosis Date   AICD (automatic cardioverter/defibrillator) present 05/2008   [employer]   Allergy    Arthritis    Bradycardia    Use of beta blocker limited   CAD (coronary artery disease)    a- S/p cabg in march 2006 by Dr.Owen;  b. myoview  5/12: large IL scar from apex to base, no ischemia, EF 37%   Cancer (HCC)    on right side of neck   Clotting disorder    ED (erectile dysfunction)    Gout    Hyperlipidemia    Ischemic cardiomyopathy    a-Echocardiogram, Nov 2006, showed ejection fraction of 30-40% with mild to moderated mitral  regurgitation and mild aortic regurgitation b- Cardiac MRI, May 2007, EF of 44% with 50% scar involving  the inferolateral walls. No comment of mitral regurgitation. c- last echo  11/10: EF 30-35%  mod MR d- Nega T-Wave alternans testing in May of 2007 e- no ACE due to low BP;  f. CPX 3/11: normal   RBBB (right bundle branch block with left anterior fascicular block)    Systolic CHF, chronic (HCC)    Thyroid  disease     Patient Active Problem List   Diagnosis Date Noted   ICD (implantable cardioverter-defibrillator) lead failure 11/09/2023   Presence of implantable cardioverter-defibrillator (ICD) 06/26/2022   VT (ventricular tachycardia) (HCC) 07/02/2021   Paroxysmal atrial  fibrillation (HCC) 07/02/2021   Heme + stool    Internal and external prolapsed hemorrhoids    Difficulty swallowing 01/21/2018   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 (HCC) 12/24/2017   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 (HCC) 11/12/2017   PCP NOTES >>>>>>>>>>>>>>>>>>>>> 04/21/2016   Seborrheic dermatitis of scalp 04/11/2013   Annual physical exam 04/11/2011   MITRAL REGURGITATION 04/10/2010   SYSTOLIC HEART FAILURE, CHRONIC 10/03/2009   Hyperglycemia 06/19/2008   Coronary atherosclerosis 06/14/2008   Automatic implantable cardioverter-defibrillator in situ 06/01/2008   ERECTILE DYSFUNCTION 12/17/2007   Hyperlipidemia 02/06/2007   Gout 02/06/2007   RBBB 02/06/2007   Congestive heart failure (HCC) 02/06/2007   CORONARY ARTERY BYPASS GRAFT, FOUR VESSEL, HX OF 02/12/2005    Past Surgical History:  Procedure Laterality Date   CARDIAC DEFIBRILLATOR PLACEMENT  06/01/08   ICD   COLONOSCOPY     COLONOSCOPY WITH PROPOFOL  N/A 07/28/2019   Procedure: COLONOSCOPY WITH PROPOFOL ;  Surgeon: Avram Lupita BRAVO, MD;  Location: [facility] ENDOSCOPY;  Service: Endoscopy;  Laterality: N/A;   CORONARY ARTERY BYPASS GRAFT  02-2005   ICD GENERATOR CHANGEOUT N/A 02/03/2017   Procedure: ICD Generator Changeout;  Surgeon: Danelle LELON Birmingham, MD;  Location: [facility] INVASIVE  CV LAB;  Service: Cardiovascular;  Laterality: N/A;   ICD GENERATOR CHANGEOUT N/A 11/09/2023   Procedure: ICD GENERATOR CHANGEOUT;  Surgeon: Waddell Danelle ORN, MD;  Location: [facility] INVASIVE CV LAB;  Service: Cardiovascular;  Laterality: N/A;   LEAD REVISION/REPAIR N/A 11/09/2023   Procedure: LEAD REVISION/REPAIR;  Surgeon: Waddell Danelle ORN, MD;  Location: [facility] INVASIVE CV LAB;  Service: Cardiovascular;  Laterality: N/A;   ORIF ANKLE FRACTURE  1987   left    RIGHT/LEFT HEART CATH AND CORONARY/GRAFT ANGIOGRAPHY N/A 09/20/2019   Procedure: RIGHT/LEFT HEART CATH AND CORONARY/GRAFT  ANGIOGRAPHY;  Surgeon: Cherrie Toribio SAUNDERS, MD;  Location: [facility] INVASIVE CV LAB;  Service: Cardiovascular;  Laterality: N/A;   SHOULDER ARTHROSCOPY  02/2010   rt    TEE WITHOUT CARDIOVERSION N/A 10/24/2019   Procedure: TRANSESOPHAGEAL ECHOCARDIOGRAM (TEE);  Surgeon: Cherrie Toribio SAUNDERS, MD;  Location: [facility] ENDOSCOPY;  Service: Cardiovascular;  Laterality: N/A;   TONSILLECTOMY     as a child       Home Medications    Prior to Admission medications  Medication Sig Start Date End Date Taking? Authorizing Provider  acetaminophen  (TYLENOL ) 500 MG tablet Take 500-1,000 mg by mouth every 6 (six) hours as needed (for pain.).   Yes [provider]  amiodarone  (PACERONE ) 200 MG tablet Take 1 tablet (200 mg total) by mouth daily. 08/31/24  Yes Waddell Danelle ORN, MD  benzonatate  (TESSALON ) 200 MG capsule Take 1 capsule (200 mg total) by mouth 3 (three) times daily as needed for cough. 12/11/24  Yes Maranda Jamee Jacob, MD  carboxymethylcellul-glycerin (OPTIVE) 0.5-0.9 % ophthalmic solution Place 1 drop into both eyes daily. Refresh   Yes [provider]  cetirizine (ZYRTEC) 10 MG tablet Take 10 mg by mouth daily as needed for allergies.   Yes [provider]  Colchicine  (MITIGARE ) 0.6 MG CAPS Take 0.6 mg by mouth 2 (two) times daily as needed (gout flare). 01/25/18  Yes Paz, Aloysius BRAVO, MD  dapagliflozin  propanediol (FARXIGA ) 10 MG TABS tablet Take 1 tablet (10 mg total) by mouth daily. (Take BEFORE BREAKFAST) 04/22/24  Yes Paz, Jose E, MD  ELIQUIS  5 MG TABS tablet TAKE 1 TABLET BY MOUTH TWICE A DAY 11/07/24  Yes Waddell Danelle ORN, MD  furosemide  (LASIX ) 40 MG tablet TAKE 1 TABLET BY MOUTH EVERY DAY Patient taking differently: Take 40 mg by mouth every other day. 05/12/23  Yes Bensimhon, Toribio SAUNDERS, MD  levothyroxine  (SYNTHROID ) 88 MCG tablet Take 1 tablet (88 mcg total) by mouth daily before breakfast. 08/19/24  Yes Paz, Jose E, MD  losartan  (COZAAR ) 25 MG tablet Take 0.5 tablets (12.5 mg total)  by mouth at bedtime. 05/03/24  Yes Bensimhon, Toribio SAUNDERS, MD  mexiletine (MEXITIL ) 250 MG capsule Take 1 capsule (250 mg total) by mouth 2 (two) times daily. PLEASE SCHEDULE APPOINTMENT FOR MORE REFILLS [phone] OPTION 2 11/18/24  Yes Bensimhon, Toribio SAUNDERS, MD  nitroGLYCERIN  (NITROSTAT ) 0.4 MG SL tablet Place 1 tablet (0.4 mg total) under the tongue every 5 (five) minutes x 3 doses as needed for chest pain. 03/14/20  Yes Paz, Aloysius BRAVO, MD  Omega-3 Fatty Acids (FISH OIL) 1200 MG CAPS Take 3,600 mg by mouth daily.   Yes [provider]  rosuvastatin  (CRESTOR ) 40 MG tablet Take 1 tablet (40 mg total) by mouth daily. 12/07/24  Yes Amon Aloysius BRAVO, MD  spironolactone  (ALDACTONE ) 25 MG tablet TAKE 1/2 TABLET BY MOUTH EVERY DAY 12/30/23  Yes Bensimhon, Toribio SAUNDERS, MD  doxycycline  (VIBRAMYCIN ) 100 MG  capsule Take 1 capsule (100 mg total) by mouth 2 (two) times daily. 12/11/24   Maranda Jamee Jacob, MD    Family History Family History  Problem Relation Age of Onset   Heart attack Father 55   Cardiomyopathy Brother    Early death Brother    Heart disease Brother    Gallstones Brother    Colon cancer Neg Hx    Prostate cancer Neg Hx    Diabetes Neg Hx     Social History Social History[1]   Allergies   Atorvastatin, Entresto  [sacubitril-valsartan], Sulfonamide derivatives, Sulfamethoxazole, and Tape   Review of Systems Review of Systems See HPI  Physical Exam Triage Vital Signs ED Triage Vitals  Encounter Vitals Group     BP 12/11/24 1458 116/78     Girls Systolic BP Percentile --      Girls Diastolic BP Percentile --      Boys Systolic BP Percentile --      Boys Diastolic BP Percentile --      Pulse Rate 12/11/24 1458 64     Resp 12/11/24 1458 16     Temp 12/11/24 1458 98.7 F (37.1 C)     Temp Source 12/11/24 1458 Oral     SpO2 12/11/24 1458 93 %     Weight --      Height --      Head Circumference --      Peak Flow --      Pain Score 12/11/24 1456 2     Pain Loc --      Pain  Education --      Exclude from Growth Chart --    No data found.  Updated Vital Signs BP 116/78 (BP Location: Right Arm)   Pulse 64   Temp 98.7 F (37.1 C) (Oral)   Resp 16   SpO2 93%       Physical Exam Constitutional:      General: He is not in acute distress.    Appearance: He is well-developed and normal weight.  HENT:     Head: Normocephalic and atraumatic.     Right Ear: Tympanic membrane normal.     Left Ear: Tympanic membrane normal.     Nose: Congestion and rhinorrhea present.     Mouth/Throat:     Mouth: Mucous membranes are moist.     Pharynx: No posterior oropharyngeal erythema.  Eyes:     Conjunctiva/sclera: Conjunctivae normal.     Pupils: Pupils are equal, round, and reactive to light.  Cardiovascular:     Rate and Rhythm: Normal rate and regular rhythm.     Heart sounds: Normal heart sounds.  Pulmonary:     Effort: Pulmonary effort is normal. No respiratory distress.     Breath sounds: Rhonchi present.  Musculoskeletal:        General: Normal range of motion.     Cervical back: Normal range of motion.  Skin:    General: Skin is warm and dry.  Neurological:     Mental Status: He is alert.      UC Treatments / Results  Labs (all labs ordered are listed, but only abnormal results are displayed) Labs Reviewed - No data to display  EKG   Radiology No results found.  Procedures Procedures (including critical care time)  Medications Ordered in UC Medications - No data to display  Initial Impression / Assessment and Plan / UC Course  I have reviewed the triage vital signs and the nursing notes.  Pertinent labs & imaging results that were available during my care of the patient were reviewed by me and considered in my medical decision making (see chart for details).     Final Clinical Impressions(s) / UC Diagnoses   Final diagnoses:  Acute bronchitis, unspecified organism     Discharge Instructions      Take the antibiotic  doxycycline  2 times a day.  Take the antibiotic with food. Make certain you are drinking lots of fluids May take Tessalon  2 or 3 times a day to help with cough See your doctor if not improving in a week   ED Prescriptions     Medication Sig Dispense Auth. Provider   benzonatate  (TESSALON ) 200 MG capsule Take 1 capsule (200 mg total) by mouth 3 (three) times daily as needed for cough. 21 capsule Maranda Jamee Jacob, MD   azithromycin  (ZITHROMAX  Z-PAK) 250 MG tablet  (Status: Discontinued) Take 2 pills right away.  After this take 1 pill a day until gone 6 tablet Maranda Jamee Jacob, MD   doxycycline  (VIBRAMYCIN ) 100 MG capsule  (Status: Discontinued) Take 1 capsule (100 mg total) by mouth 2 (two) times daily. 20 capsule Maranda Jamee Jacob, MD   doxycycline  (VIBRAMYCIN ) 100 MG capsule Take 1 capsule (100 mg total) by mouth 2 (two) times daily. 20 capsule Maranda Jamee Jacob, MD      PDMP not reviewed this encounter.    [1]  Social History Tobacco Use   Smoking status: Former    Current packs/day: 0.00    Average packs/day: 0.1 packs/day    Types: Cigarettes    Quit date: 12/16/1999    Years since quitting: 25.0   Smokeless tobacco: Current    Types: Chew  Vaping Use   Vaping status: Never Used  Substance Use Topics   Alcohol use: Not Currently   Drug use: Never     Maranda Jamee Jacob, MD 12/11/24 1538  "

## 2024-12-19 NOTE — Telephone Encounter (Signed)
 PAP: Patient assistance application for Farxiga  has been approved by P[employer] Companies: [employer] from 12/15/2024 to 12/14/2025. Medication should be delivered to PAP Delivery: Home. For further shipping updates, please contact [employer] ([employer] at [phone]. Patient ID is: not provided.

## 2025-01-09 ENCOUNTER — Encounter: Admitting: Internal Medicine

## 2025-01-12 ENCOUNTER — Other Ambulatory Visit: Payer: Self-pay | Admitting: Internal Medicine

## 2025-01-17 ENCOUNTER — Other Ambulatory Visit: Payer: Self-pay | Admitting: Internal Medicine

## 2025-01-24 ENCOUNTER — Ambulatory Visit: Payer: Medicare HMO

## 2025-03-15 ENCOUNTER — Encounter: Admitting: Internal Medicine

## 2025-04-25 ENCOUNTER — Ambulatory Visit: Payer: Medicare HMO

## 2025-11-03 ENCOUNTER — Ambulatory Visit
